# Patient Record
Sex: Male | Born: 1985 | Race: Black or African American | Hispanic: No | Smoking: Current every day smoker
Health system: Southern US, Community
[De-identification: ages and names within clinical notes are randomized; demographics above are authoritative.]

## PROBLEM LIST (undated history)

## (undated) DIAGNOSIS — R0600 Dyspnea, unspecified: Secondary | ICD-10-CM

## (undated) DIAGNOSIS — F419 Anxiety disorder, unspecified: Secondary | ICD-10-CM

## (undated) DIAGNOSIS — Z87898 Personal history of other specified conditions: Secondary | ICD-10-CM

## (undated) DIAGNOSIS — F329 Major depressive disorder, single episode, unspecified: Secondary | ICD-10-CM

## (undated) DIAGNOSIS — F32A Depression, unspecified: Secondary | ICD-10-CM

## (undated) DIAGNOSIS — N186 End stage renal disease: Secondary | ICD-10-CM

## (undated) DIAGNOSIS — Z9889 Other specified postprocedural states: Secondary | ICD-10-CM

## (undated) DIAGNOSIS — N19 Unspecified kidney failure: Secondary | ICD-10-CM

## (undated) DIAGNOSIS — I499 Cardiac arrhythmia, unspecified: Secondary | ICD-10-CM

## (undated) DIAGNOSIS — I639 Cerebral infarction, unspecified: Secondary | ICD-10-CM

## (undated) DIAGNOSIS — I10 Essential (primary) hypertension: Secondary | ICD-10-CM

## (undated) DIAGNOSIS — I2510 Atherosclerotic heart disease of native coronary artery without angina pectoris: Secondary | ICD-10-CM

## (undated) HISTORY — PX: CORONARY ANGIOPLASTY: SHX604

## (undated) HISTORY — DX: Major depressive disorder, single episode, unspecified: F32.9

## (undated) HISTORY — DX: Anxiety disorder, unspecified: F41.9

## (undated) HISTORY — DX: Cerebral infarction, unspecified: I63.9

## (undated) HISTORY — DX: Depression, unspecified: F32.A

## (undated) HISTORY — DX: Cardiac arrhythmia, unspecified: I49.9

## (undated) HISTORY — DX: Unspecified kidney failure: N19

---

## 2007-12-22 ENCOUNTER — Emergency Department: Payer: Self-pay | Admitting: Emergency Medicine

## 2009-08-08 ENCOUNTER — Emergency Department: Payer: Self-pay | Admitting: Emergency Medicine

## 2009-08-25 ENCOUNTER — Ambulatory Visit: Payer: Self-pay | Admitting: Internal Medicine

## 2009-12-13 ENCOUNTER — Emergency Department (HOSPITAL_COMMUNITY): Admission: EM | Admit: 2009-12-13 | Discharge: 2009-12-13 | Payer: Self-pay | Admitting: Emergency Medicine

## 2009-12-30 ENCOUNTER — Emergency Department: Payer: Self-pay | Admitting: Emergency Medicine

## 2010-06-21 ENCOUNTER — Emergency Department: Payer: Self-pay | Admitting: Unknown Physician Specialty

## 2010-08-14 ENCOUNTER — Emergency Department: Payer: Self-pay | Admitting: Emergency Medicine

## 2011-01-20 ENCOUNTER — Emergency Department: Payer: Self-pay | Admitting: Emergency Medicine

## 2011-04-18 ENCOUNTER — Emergency Department: Payer: Self-pay | Admitting: Internal Medicine

## 2011-10-05 ENCOUNTER — Emergency Department: Payer: Self-pay | Admitting: Internal Medicine

## 2011-10-07 ENCOUNTER — Emergency Department: Payer: Self-pay | Admitting: Emergency Medicine

## 2011-10-18 ENCOUNTER — Emergency Department: Payer: Self-pay | Admitting: Internal Medicine

## 2011-12-01 ENCOUNTER — Emergency Department: Payer: Self-pay | Admitting: *Deleted

## 2012-01-08 ENCOUNTER — Emergency Department: Payer: Self-pay

## 2012-01-08 LAB — CBC
HGB: 14.1 g/dL (ref 13.0–18.0)
MCH: 28.8 pg (ref 26.0–34.0)
MCHC: 33.4 g/dL (ref 32.0–36.0)
MCV: 86 fL (ref 80–100)
Platelet: 147 10*3/uL — ABNORMAL LOW (ref 150–440)
RBC: 4.88 10*6/uL (ref 4.40–5.90)

## 2012-01-08 LAB — COMPREHENSIVE METABOLIC PANEL
Anion Gap: 9 (ref 7–16)
BUN: 12 mg/dL (ref 7–18)
Calcium, Total: 8.5 mg/dL (ref 8.5–10.1)
Chloride: 104 mmol/L (ref 98–107)
Creatinine: 0.93 mg/dL (ref 0.60–1.30)
Glucose: 89 mg/dL (ref 65–99)
Osmolality: 279 (ref 275–301)
Potassium: 3.4 mmol/L — ABNORMAL LOW (ref 3.5–5.1)
SGOT(AST): 42 U/L — ABNORMAL HIGH (ref 15–37)
Total Protein: 7.5 g/dL (ref 6.4–8.2)

## 2012-01-08 LAB — CK TOTAL AND CKMB (NOT AT ARMC): CK-MB: 4.3 ng/mL — ABNORMAL HIGH (ref 0.5–3.6)

## 2012-03-07 ENCOUNTER — Emergency Department: Payer: Self-pay | Admitting: Emergency Medicine

## 2012-03-14 ENCOUNTER — Emergency Department: Payer: Self-pay | Admitting: Emergency Medicine

## 2012-03-15 LAB — COMPREHENSIVE METABOLIC PANEL
Albumin: 4 g/dL (ref 3.4–5.0)
Anion Gap: 8 (ref 7–16)
BUN: 9 mg/dL (ref 7–18)
Chloride: 104 mmol/L (ref 98–107)
Co2: 27 mmol/L (ref 21–32)
EGFR (African American): >60
Glucose: 109 mg/dL — ABNORMAL HIGH (ref 65–99)
Osmolality: 277 (ref 275–301)
SGOT(AST): 49 U/L — ABNORMAL HIGH (ref 15–37)
SGPT (ALT): 48 U/L
Total Protein: 7.2 g/dL (ref 6.4–8.2)

## 2012-03-15 LAB — CBC: RBC: 4.53 10*6/uL (ref 4.40–5.90)

## 2012-03-15 LAB — CK TOTAL AND CKMB (NOT AT ARMC): CK-MB: 6.8 ng/mL — ABNORMAL HIGH (ref 0.5–3.6)

## 2012-05-05 ENCOUNTER — Emergency Department: Payer: Self-pay | Admitting: *Deleted

## 2012-05-05 LAB — COMPREHENSIVE METABOLIC PANEL
Anion Gap: 6 — ABNORMAL LOW (ref 7–16)
BUN: 16 mg/dL (ref 7–18)
Bilirubin,Total: 0.2 mg/dL (ref 0.2–1.0)
Chloride: 104 mmol/L (ref 98–107)
Creatinine: 0.92 mg/dL (ref 0.60–1.30)
Osmolality: 284 (ref 275–301)

## 2012-05-05 LAB — CBC
MCH: 28.2 pg (ref 26.0–34.0)
Platelet: 159 10*3/uL (ref 150–440)
RBC: 4.98 10*6/uL (ref 4.40–5.90)

## 2012-05-13 ENCOUNTER — Ambulatory Visit: Payer: Self-pay | Admitting: Internal Medicine

## 2012-05-13 LAB — CBC
HCT: 44.7 % (ref 40.0–52.0)
HGB: 14.8 g/dL (ref 13.0–18.0)
MCH: 28.5 pg (ref 26.0–34.0)
MCHC: 33.2 g/dL (ref 32.0–36.0)
MCV: 86 fL (ref 80–100)
Platelet: 172 x10 3/mm 3 (ref 150–440)
RBC: 5.19 x10 6/mm 3 (ref 4.40–5.90)
RDW: 14.3 % (ref 11.5–14.5)
WBC: 8.6 x10 3/mm 3 (ref 3.8–10.6)

## 2012-05-13 LAB — COMPREHENSIVE METABOLIC PANEL
Alkaline Phosphatase: 87 U/L (ref 50–136)
BUN: 13 mg/dL (ref 7–18)
Chloride: 106 mmol/L (ref 98–107)
Co2: 28 mmol/L (ref 21–32)
Creatinine: 0.93 mg/dL (ref 0.60–1.30)
EGFR (African American): >60
Glucose: 97 mg/dL (ref 65–99)
Osmolality: 281 (ref 275–301)
SGOT(AST): 51 U/L — ABNORMAL HIGH (ref 15–37)
Sodium: 141 mmol/L (ref 136–145)

## 2012-05-13 LAB — APTT: Activated PTT: 31.4 s (ref 23.6–35.9)

## 2012-05-13 LAB — CK TOTAL AND CKMB (NOT AT ARMC)
CK, Total: 484 U/L — ABNORMAL HIGH (ref 35–232)
CK-MB: 3.2 ng/mL (ref 0.5–3.6)

## 2012-05-13 LAB — PROTIME-INR: INR: 1

## 2012-05-13 LAB — TROPONIN I: Troponin-I: 0.02 ng/mL

## 2012-05-22 ENCOUNTER — Emergency Department: Payer: Self-pay | Admitting: *Deleted

## 2012-09-14 ENCOUNTER — Emergency Department: Payer: Self-pay | Admitting: *Deleted

## 2012-09-14 LAB — CBC
MCH: 29.6 pg (ref 26.0–34.0)
MCHC: 34.4 g/dL (ref 32.0–36.0)
MCV: 86 fL (ref 80–100)
Platelet: 157 10*3/uL (ref 150–440)

## 2012-09-14 LAB — COMPREHENSIVE METABOLIC PANEL
Albumin: 4.3 g/dL (ref 3.4–5.0)
Alkaline Phosphatase: 69 U/L (ref 50–136)
BUN: 14 mg/dL (ref 7–18)
Bilirubin,Total: 0.3 mg/dL (ref 0.2–1.0)
Co2: 30 mmol/L (ref 21–32)
Creatinine: 0.96 mg/dL (ref 0.60–1.30)
EGFR (Non-African Amer.): >60
SGPT (ALT): 42 U/L (ref 12–78)
Sodium: 140 mmol/L (ref 136–145)

## 2012-09-14 LAB — CK TOTAL AND CKMB (NOT AT ARMC): CK, Total: 451 U/L — ABNORMAL HIGH (ref 35–232)

## 2012-10-20 ENCOUNTER — Emergency Department: Payer: Self-pay | Admitting: Emergency Medicine

## 2012-10-20 LAB — URINALYSIS, COMPLETE
Ketone: NEGATIVE
Nitrite: NEGATIVE
Ph: 7 (ref 4.5–8.0)
Protein: 30
Specific Gravity: 1.019 (ref 1.003–1.030)
WBC UR: 291 /HPF (ref 0–5)

## 2013-10-14 ENCOUNTER — Emergency Department: Payer: Self-pay | Admitting: Emergency Medicine

## 2013-10-14 LAB — CBC
HGB: 14 g/dL (ref 13.0–18.0)
Platelet: 152 10*3/uL (ref 150–440)
WBC: 10.4 10*3/uL (ref 3.8–10.6)

## 2013-10-14 LAB — COMPREHENSIVE METABOLIC PANEL
Alkaline Phosphatase: 73 U/L
Calcium, Total: 9 mg/dL (ref 8.5–10.1)
Osmolality: 273 (ref 275–301)
Potassium: 3.5 mmol/L (ref 3.5–5.1)

## 2015-01-17 ENCOUNTER — Emergency Department: Payer: Self-pay | Admitting: Internal Medicine

## 2015-02-14 ENCOUNTER — Emergency Department: Payer: Self-pay | Admitting: Emergency Medicine

## 2015-02-14 LAB — BASIC METABOLIC PANEL
Anion Gap: 6 — ABNORMAL LOW (ref 7–16)
BUN: 13 mg/dL
CHLORIDE: 101 mmol/L
CREATININE: 1.12 mg/dL
Calcium, Total: 9.4 mg/dL
Co2: 32 mmol/L
GLUCOSE: 105 mg/dL — AB
Potassium: 3.4 mmol/L — ABNORMAL LOW
Sodium: 139 mmol/L

## 2015-02-14 LAB — CBC
HCT: 43.1 % (ref 40.0–52.0)
HGB: 14.6 g/dL (ref 13.0–18.0)
MCH: 28.8 pg (ref 26.0–34.0)
MCHC: 33.8 g/dL (ref 32.0–36.0)
MCV: 85 fL (ref 80–100)
PLATELETS: 145 10*3/uL — AB (ref 150–440)
RBC: 5.05 10*6/uL (ref 4.40–5.90)
RDW: 13.8 % (ref 11.5–14.5)
WBC: 9.6 10*3/uL (ref 3.8–10.6)

## 2015-02-14 LAB — TROPONIN I

## 2015-03-12 NOTE — H&P (Signed)
PATIENT NAME:  Alan Gross, Alan Gross MR#:  [id number] DATE OF BIRTH:  1986/05/10  DATE OF ADMISSION:  05/13/2012  PRIMARY CARE PHYSICIAN: None  ED REFERRING PHYSICIAN: Dr. Prince Rome  CHIEF COMPLAINT: Chest pain.   HISTORY OF PRESENT ILLNESS: Patient is a 29 year old African American male with previous history of coronary artery disease who also has history of uncontrolled blood pressure who presents with complaint of chest pain since last night. He describes the pain as a sharp type of pain with associated shortness of breath. Patient reports a milder episode last Sunday but did not seek any medical care. He did not have any radiation of the pain to his arm or jaw. He did also have some shortness of breath. His pain is improved. He was seen in the ED, had an EKG that suggested a possible ST elevation. He was seen by cardiology, Dr. Clayborn Bigness, who will be taking him to catheterization. He recommended that we admit the patient. Patient currently is feeling better and chest pain is improved. Otherwise denies any dyspnea on exertion, nocturnal dyspnea, orthopnea.   PAST MEDICAL HISTORY:  1. Seizure disorder.  2. Hypertension.  3. Previous history of myocardial infarction.   PAST SURGICAL HISTORY: None.   ALLERGIES: None.   MEDICATIONS: He was on antihypertensives which he stopped taking.   SOCIAL HISTORY: Smokes less than 1 pack per day. Does not drink. Uses marijuana from time to time.   FAMILY HISTORY: Grandmother had heart disease.   REVIEW OF SYSTEMS: CONSTITUTIONAL: Denies any fevers, fatigue, weakness. Chest pain as above. Complains of pain on his tooth that has been bothering him for a while. Denies any weight loss, weight gain. EYES: No blurred or double vision. No pain. No redness. No inflammation. No glaucoma. ENT: No tinnitus. No ear pain. No hearing loss. No seasonal or year-round allergies. No nasal discharge. No difficulty swallowing. RESPIRATORY: No cough. No wheezing. No hemoptysis. No  chronic obstructive pulmonary disease. No TB. CARDIOVASCULAR: Complains of chest pain as above. No orthopnea. No edema. No arrhythmia. No palpitations. No syncope. GASTROINTESTINAL: No nausea, vomiting, diarrhea. No abdominal pain. No hematemesis. No gastroesophageal reflux disease. No irritable bowel syndrome. No jaundice. GENITOURINARY: Denies any dysuria, hematuria, renal calculus, or frequency. ENDO: Denies any polydipsia, nocturia, or thyroid problems. HEME/LYMPH: Denies anemia, easy bruisability, or bleeding. SKIN: No acne. No rash. No changes in mole, hair or skin. MUSCULOSKELETAL: Denies any pain in neck, back, or shoulder. NEURO: No numbness. No weakness. No cerebrovascular accident. No transient ischemic attack. PSYCHIATRIC: No anxiety. No examination. No ADD.   PHYSICAL EXAMINATION:  VITAL SIGNS: Temperature 98.4, pulse 67, respirations 18, blood pressure 158/88, O2 98% on room air.   GENERAL: Patient is a well developed, well nourished [name] male currently not in any distress.   HEENT: Head atraumatic, normocephalic. Pupils equally round, reactive to light and accommodation. There is no JVD. No carotid bruits.   CARDIOVASCULAR: Regular rate and rhythm. No murmurs, rubs, clicks, or gallops. PMI is not displaced.   LUNGS: Clear to auscultation bilaterally without any rales, rhonchi, wheezing.   ABDOMEN: Soft, nontender, nondistended. Positive bowel sounds x4.   EXTREMITIES: No clubbing, cyanosis, edema.   SKIN: No rash.   LYMPHATICS: No lymph nodes palpable.   VASCULAR: Good DP, PT pulses.   PSYCHIATRIC: Not anxious or depressed.   NEUROLOGICAL: Cranial nerves II through XII grossly intact. No focal deficits.   LYMPHATICS: No lymph nodes palpable.   LABORATORY, DIAGNOSTIC AND RADIOLOGICAL DATA: WBC 8.6, hemoglobin 14.8, platelet  count 172. INR 1.0. Cardiac enzymes are currently pending. Patient is going to catheterization lab. EKG shows slight ST elevation in V2, V3,  V4, inverted T waves in inferior leads.   ASSESSMENT AND PLAN: Patient is a 29 year old African American male presents with chest pain since last night, noted to have abnormal EKG. Seen by cardiology. Plan for catheterization.  1. Chest pain, possibly due to MI. Will place him on aspirin. Cardiac catheterization today. Beta blockers. Will also check TUDS to make sure there is no cocaine-induced chest pain.  2. Hypertension. Will place patient on metoprolol.  3. Seizure disorder, not on any treatment.  4. Nicotine addiction.  5. Please note if patient's cardiac catheterization is negative he will be discharged later today. He states that he has to go to work and he is requesting to be discharged if catheterization is negative.   TIME SPENT: 30 minutes.   ____________________________ Lafonda Mosses Posey Pronto, MD shp:cms D: 05/13/2012 14:06:11 ET T: 05/13/2012 14:21:23 ET JOB#: [id number]  cc: Loralye Loberg H. Posey Pronto, MD, <Dictator>  Alric Seton MD ELECTRONICALLY SIGNED 05/14/2012 16:38

## 2015-03-12 NOTE — Consult Note (Signed)
PATIENT NAME:  Alan Gross, Alan Gross MR#:  [id number] DATE OF BIRTH:  12/16/1985  DATE OF CONSULTATION:  05/13/2012  REFERRING PHYSICIAN:  Beaulah Dinning, MD CONSULTING PHYSICIAN:  Jolan Upchurch D. Kris Burd, MD  INDICATION: Grossly abnormal EKG, persistent unstable angina and chest pain with malignant hypertension.  HISTORY OF PRESENT ILLNESS: Alan Gross is a 29 year old African American male with hypertension, possible myocardial infarction in the past, seizure disorder, and possible noncompliance who presents with recurrent chest pain and angina, midsternal, incapacitating, 10/10, with radiation to his back. He has had some shortness of breath, but no syncope. The pain got progressively worse so he came to Emergency Room for evaluation and admission. Reportedly had an EKG done about a week ago for unclear reasons and presents today with grossly abnormal EKG with diffuse ST elevation laterally and anterior with reciprocal depression inferiorly, left ventricular hypertrophy voltage criteria. He denies any drug use, but with the persistent pain he came to Emergency Room and was decided to be admitted.   REVIEW OF SYSTEMS: No blackout spells or syncope. No nausea or vomiting. No fever or chills. No sweats. No weight loss or weight gain. No hemoptysis or hematemesis. No bright red blood per rectum.   PAST MEDICAL HISTORY:  1. Chest pain. 2. Angina. 3. Seizures. 4. Myocardial infarction.  5. Hypertension. 6. Obesity.   SOCIAL HISTORY: I think he is a smoker. It is not clear if he uses alcohol, but illicit drugs.   MEDICATIONS: It is not clear if he is on any medicines, but should be on blood pressure medicine.   ALLERGIES: No known drug allergies.  PHYSICAL EXAMINATION:   VITAL SIGNS: Blood pressure 180/90, pulse 65, respiratory rate 18, and afebrile.   HEENT: Normocephalic, atraumatic. Pupils equal and reactive to light.   NECK: Supple. No significant jugular venous distention, bruits or adenopathy.    LUNGS: Clear to auscultation and percussion. No significant wheeze, rhonchi, or rale.   HEART: Regular rate and rhythm. Systolic ejection murmur at left sternal border. Positive S4. PMI is nondisplaced.   ABDOMEN: Examination is benign.   EXTREMITIES: Examination is within normal limits.   NEUROLOGIC: Examination is intact.   SKIN: Examination is normal.   LABORATORY, DIAGNOSTIC AND RADIOLOGIC DATA: CK 484, MB 3.2, and troponin 0.02. PT-INR negative. Hemoglobin and hematocrit 14.8 and 47, white count 8.6, and platelet count 172. MET-B unremarkable. LFTs slightly elevated with ALT 74 and AST 51.   Chest x-ray is pending.   EKG is grossly abnormal, as I mentioned.   ASSESSMENT:  1. Unstable angina.  2. Angina.  3. Possible myocardial infarction. 4. Malignant hypertension. 5. Obesity. 6. Reportedly has history of smoking. 7. Myocardial infarction in the past. Reportedly had a catheterization at [facility], which reportedly looked okay, but now presents with recurrent chest pain.   PLAN: Recommend further evaluation. We will probably proceed with cardiac catheterization as he is requiring morphine in the Emergency Room and narcotics for chest pain with a grossly abnormal EKG, malignant hypertension, and history of myocardial infarction in the past. Follow up LFTs. Would probably institute hypertensive medication. With his malignant hypertension, we will probably evaluate his renal arteries for renal artery stenosis. Recommend weight loss and exercise and base further evaluation until after the catheterization. Hopefully the catheterization will be okay and we can treat the patient for noncardiac pain. ____________________________ Loran Senters. Clayborn Bigness, MD ddc:slb D: 05/13/2012 14:01:24 ET T: 05/13/2012 14:25:38 ET JOB#: [id number]  cc: Muhsin Doris D. Clayborn Bigness, MD, <Dictator> Tinsley Lomas Prince Rome MD ELECTRONICALLY  SIGNED 06/01/2012 6:36

## 2015-03-12 NOTE — Discharge Summary (Signed)
PATIENT NAME:  Alan Gross, Alan Gross MR#:  [id number] DATE OF BIRTH:  1985/11/27  DATE OF ADMISSION:  05/13/2012 DATE OF DISCHARGE:  05/13/2012  ADMITTING DIAGNOSES: Chest pain.   DISCHARGE DIAGNOSES:  1. Chest pain felt to be noncardiac in origin, status post cardiac catheterization, possibly related to accelerated hypertension. Could be also reflux related.  2. Hypertension.  3. Previous history of coronary artery disease, per patient's report. 4. Seizure disorder.   CONSULTANTS: Lujean Amel, MD - Cardiology.  PROCEDURES: Cardiac catheterization showed normal coronaries, normal ejection fraction.   PERTINENT LABORATORY AND EVALUATIONS: Glucose 97, BUN 13, creatinine 0.93, sodium 141, potassium 3.8, chloride 106, CO2 28, and calcium 9.1. LFTs were normal except AST was 51. CPK was 484 and CK-MB 3.2. Troponin was 0.02. WBC 8.6, hemoglobin 14.8, and platelet count 172. INR was 1.0.   Chest x-ray showed cardiomegaly without any other abnormality.   EKG showed possible ST elevation in leads III, IV and V and T wave inversions in inferior leads.   HOSPITAL COURSE: The patient is a 29 year old white male who presented with chest pain. He reported that he has a history of coronary artery disease and started having chest pain since yesterday. The patient had an EKG which showed possible ST elevation in anterior leads. He does have left ventricular hypertrophy and this is likely due to repolarization abnormality. The ED physician spoke to Dr. Clayborn Bigness who came and saw the patient and recommended the patient go to cardiac catheterization. He underwent cardiac catheterization, which was normal. At this time, the patient is stable to be discharged home.    DISCHARGE MEDICATIONS:  1. Atenolol 50 mg p.o. daily. 2. Percocet 5/325 mg p.o. every eight hours p.r.n.  3. Zantac 150 mg p.o. twice a day, over-the-counter.   DIET: Low sodium.   ACTIVITY: As tolerated.   TIMEFRAME FOR FOLLOW UP: Followup in  2 to 4 weeks at the [facility].  TIME SPENT: 25 minutes.  ____________________________ Lafonda Mosses Posey Pronto, MD shp:slb D: 05/13/2012 16:31:33 ET T: 05/14/2012 14:33:18 ET JOB#: [id number]  cc: Rocky Rishel H. Posey Pronto, MD, <Dictator> [facility] Alric Seton MD ELECTRONICALLY SIGNED 05/14/2012 16:40

## 2015-05-02 ENCOUNTER — Encounter: Payer: Self-pay | Admitting: Emergency Medicine

## 2015-05-02 ENCOUNTER — Emergency Department
Admit: 2015-05-02 | Discharge: 2015-05-02 | Disposition: A | Discharge status: Home / Self Care | Payer: 59 | Attending: Emergency Medicine | Admitting: Emergency Medicine

## 2015-05-02 ENCOUNTER — Other Ambulatory Visit: Payer: Self-pay

## 2015-05-02 ENCOUNTER — Encounter
Admission: EM | Disposition: A | Discharge status: Home / Self Care | Payer: Self-pay | Source: Home / Self Care | Attending: Internal Medicine

## 2015-05-02 ENCOUNTER — Inpatient Hospital Stay
Admission: EM | Admit: 2015-05-02 | Discharge: 2015-05-04 | DRG: 287 | Disposition: A | Discharge status: Home / Self Care | Payer: 59 | Attending: Internal Medicine | Admitting: Internal Medicine

## 2015-05-02 DIAGNOSIS — I249 Acute ischemic heart disease, unspecified: Principal | ICD-10-CM | POA: Diagnosis present

## 2015-05-02 DIAGNOSIS — I429 Cardiomyopathy, unspecified: Secondary | ICD-10-CM | POA: Diagnosis present

## 2015-05-02 DIAGNOSIS — Z7982 Long term (current) use of aspirin: Secondary | ICD-10-CM

## 2015-05-02 DIAGNOSIS — I10 Essential (primary) hypertension: Secondary | ICD-10-CM | POA: Diagnosis present

## 2015-05-02 DIAGNOSIS — R739 Hyperglycemia, unspecified: Secondary | ICD-10-CM | POA: Diagnosis present

## 2015-05-02 DIAGNOSIS — E876 Hypokalemia: Secondary | ICD-10-CM | POA: Diagnosis present

## 2015-05-02 DIAGNOSIS — D72829 Elevated white blood cell count, unspecified: Secondary | ICD-10-CM | POA: Diagnosis present

## 2015-05-02 DIAGNOSIS — F17210 Nicotine dependence, cigarettes, uncomplicated: Secondary | ICD-10-CM | POA: Diagnosis present

## 2015-05-02 DIAGNOSIS — F1210 Cannabis abuse, uncomplicated: Secondary | ICD-10-CM | POA: Diagnosis present

## 2015-05-02 DIAGNOSIS — F1910 Other psychoactive substance abuse, uncomplicated: Secondary | ICD-10-CM | POA: Diagnosis present

## 2015-05-02 HISTORY — PX: CARDIAC CATHETERIZATION: SHX172

## 2015-05-02 HISTORY — DX: Essential (primary) hypertension: I10

## 2015-05-02 LAB — CBC WITH DIFFERENTIAL/PLATELET
BASOS ABS: 0.1 10*3/uL (ref 0–0.1)
BASOS PCT: 1 %
EOS ABS: 0 10*3/uL (ref 0–0.7)
Eosinophils Relative: 0 %
HCT: 41.7 % (ref 40.0–52.0)
Hemoglobin: 14 g/dL (ref 13.0–18.0)
Lymphocytes Relative: 9 %
Lymphs Abs: 1.2 10*3/uL (ref 1.0–3.6)
MCH: 28.6 pg (ref 26.0–34.0)
MCHC: 33.5 g/dL (ref 32.0–36.0)
MCV: 85.4 fL (ref 80.0–100.0)
Monocytes Absolute: 0.8 10*3/uL (ref 0.2–1.0)
Monocytes Relative: 6 %
NEUTROS ABS: 11.5 10*3/uL — AB (ref 1.4–6.5)
NEUTROS PCT: 84 %
PLATELETS: 117 10*3/uL — AB (ref 150–440)
RBC: 4.89 MIL/uL (ref 4.40–5.90)
RDW: 13.2 % (ref 11.5–14.5)
WBC: 13.6 10*3/uL — ABNORMAL HIGH (ref 3.8–10.6)

## 2015-05-02 LAB — BASIC METABOLIC PANEL
Anion gap: 7 (ref 5–15)
BUN: 12 mg/dL (ref 6–20)
CALCIUM: 9 mg/dL (ref 8.9–10.3)
CO2: 33 mmol/L — ABNORMAL HIGH (ref 22–32)
Chloride: 96 mmol/L — ABNORMAL LOW (ref 101–111)
Creatinine, Ser: 1.24 mg/dL (ref 0.61–1.24)
GLUCOSE: 154 mg/dL — AB (ref 65–99)
POTASSIUM: 2.7 mmol/L — AB (ref 3.5–5.1)
SODIUM: 136 mmol/L (ref 135–145)

## 2015-05-02 LAB — MAGNESIUM: Magnesium: 1.9 mg/dL (ref 1.7–2.4)

## 2015-05-02 LAB — TROPONIN I
Troponin I: <0.03 ng/mL (ref <0.031)
Troponin I: <0.03 ng/mL (ref <0.031)

## 2015-05-02 SURGERY — LEFT HEART CATH AND CORONARY ANGIOGRAPHY
Anesthesia: Moderate Sedation | Laterality: Left

## 2015-05-02 MED ORDER — SODIUM CHLORIDE 0.9 % IJ SOLN
3.0000 mL | Freq: Two times a day (BID) | INTRAMUSCULAR | Status: DC
  Administered 2015-05-02: 3 mL via INTRAVENOUS

## 2015-05-02 MED ORDER — ASPIRIN 81 MG PO CHEW
CHEWABLE_TABLET | ORAL | Status: AC
  Administered 2015-05-02: 324 mg via ORAL
  Filled 2015-05-02: qty 4

## 2015-05-02 MED ORDER — POTASSIUM CHLORIDE CRYS ER 20 MEQ PO TBCR
40.0000 meq | EXTENDED_RELEASE_TABLET | Freq: Once | ORAL | Status: AC
  Administered 2015-05-02: 40 meq via ORAL
  Filled 2015-05-02: qty 2

## 2015-05-02 MED ORDER — ACETAMINOPHEN 325 MG PO TABS
650.0000 mg | ORAL_TABLET | ORAL | Status: DC | PRN
  Administered 2015-05-03: 650 mg via ORAL
  Filled 2015-05-02 (×2): qty 2

## 2015-05-02 MED ORDER — HEPARIN (PORCINE) IN NACL 2-0.9 UNIT/ML-% IJ SOLN
INTRAMUSCULAR | Status: AC
  Filled 2015-05-02: qty 1000

## 2015-05-02 MED ORDER — SODIUM CHLORIDE 0.9 % IV SOLN: 250.0000 mL | INTRAVENOUS | Status: DC | PRN

## 2015-05-02 MED ORDER — ASPIRIN 81 MG PO CHEW
81.0000 mg | CHEWABLE_TABLET | ORAL | Status: AC
  Administered 2015-05-03: 81 mg via ORAL
  Filled 2015-05-02: qty 1

## 2015-05-02 MED ORDER — MIDAZOLAM HCL 2 MG/2ML IJ SOLN
INTRAMUSCULAR | Status: AC
  Filled 2015-05-02: qty 2

## 2015-05-02 MED ORDER — LABETALOL HCL 5 MG/ML IV SOLN
10.0000 mg | Freq: Once | INTRAVENOUS | Status: AC
  Administered 2015-05-02: 10 mg via INTRAVENOUS

## 2015-05-02 MED ORDER — LABETALOL HCL 5 MG/ML IV SOLN
INTRAVENOUS | Status: AC
  Filled 2015-05-02: qty 4

## 2015-05-02 MED ORDER — ONDANSETRON HCL 4 MG/2ML IJ SOLN: 4.0000 mg | Freq: Four times a day (QID) | INTRAMUSCULAR | Status: DC | PRN

## 2015-05-02 MED ORDER — ASPIRIN EC 81 MG PO TBEC
81.0000 mg | DELAYED_RELEASE_TABLET | Freq: Every day | ORAL | Status: DC
  Administered 2015-05-03 – 2015-05-04 (×2): 81 mg via ORAL
  Filled 2015-05-02 (×2): qty 1

## 2015-05-02 MED ORDER — NITROGLYCERIN 0.4 MG SL SUBL: 0.4000 mg | SUBLINGUAL_TABLET | SUBLINGUAL | Status: DC | PRN

## 2015-05-02 MED ORDER — IOHEXOL 300 MG/ML  SOLN
INTRAMUSCULAR | Status: DC | PRN
  Administered 2015-05-02: 30 mL via INTRA_ARTERIAL
  Administered 2015-05-02: 80 mL via INTRA_ARTERIAL

## 2015-05-02 MED ORDER — ONDANSETRON HCL 4 MG/2ML IJ SOLN
4.0000 mg | Freq: Four times a day (QID) | INTRAMUSCULAR | Status: DC | PRN
  Administered 2015-05-03: 4 mg via INTRAVENOUS
  Filled 2015-05-02: qty 2

## 2015-05-02 MED ORDER — MIDAZOLAM HCL 2 MG/2ML IJ SOLN
INTRAMUSCULAR | Status: DC | PRN
  Administered 2015-05-02 (×2): 1 mg via INTRAVENOUS

## 2015-05-02 MED ORDER — LABETALOL HCL 5 MG/ML IV SOLN
INTRAVENOUS | Status: AC
  Administered 2015-05-02: 10 mg via INTRAVENOUS
  Filled 2015-05-02: qty 4

## 2015-05-02 MED ORDER — HYDRALAZINE HCL 20 MG/ML IJ SOLN
INTRAMUSCULAR | Status: AC
  Administered 2015-05-02: 10 mg via INTRAVENOUS
  Filled 2015-05-02: qty 1

## 2015-05-02 MED ORDER — SODIUM CHLORIDE 0.9 % IJ SOLN: 3.0000 mL | Freq: Two times a day (BID) | INTRAMUSCULAR | Status: DC

## 2015-05-02 MED ORDER — SODIUM CHLORIDE 0.9 % IJ SOLN: 3.0000 mL | INTRAMUSCULAR | Status: DC | PRN

## 2015-05-02 MED ORDER — SODIUM CHLORIDE 0.9 % WEIGHT BASED INFUSION: 3.0000 mL/kg/h | INTRAVENOUS | Status: DC

## 2015-05-02 MED ORDER — POTASSIUM CHLORIDE CRYS ER 20 MEQ PO TBCR
EXTENDED_RELEASE_TABLET | ORAL | Status: AC
  Filled 2015-05-02: qty 2

## 2015-05-02 MED ORDER — NICOTINE 21 MG/24HR TD PT24
21.0000 mg | MEDICATED_PATCH | Freq: Every day | TRANSDERMAL | Status: DC
  Administered 2015-05-03: 21 mg via TRANSDERMAL
  Filled 2015-05-02: qty 1

## 2015-05-02 MED ORDER — POTASSIUM CHLORIDE 2 MEQ/ML IV SOLN
INTRAVENOUS | Status: DC
  Administered 2015-05-02 – 2015-05-03 (×2): via INTRAVENOUS
  Filled 2015-05-02 (×11): qty 1000

## 2015-05-02 MED ORDER — ASPIRIN 300 MG RE SUPP: 300.0000 mg | RECTAL | Status: AC

## 2015-05-02 MED ORDER — METOPROLOL TARTRATE 25 MG PO TABS
25.0000 mg | ORAL_TABLET | Freq: Two times a day (BID) | ORAL | Status: DC
  Administered 2015-05-02 – 2015-05-03 (×3): 25 mg via ORAL
  Filled 2015-05-02 (×2): qty 1

## 2015-05-02 MED ORDER — ATORVASTATIN CALCIUM 20 MG PO TABS
40.0000 mg | ORAL_TABLET | Freq: Every day | ORAL | Status: DC
Start: 2015-05-03 — End: 2015-05-04
  Administered 2015-05-03: 40 mg via ORAL
  Filled 2015-05-02: qty 2

## 2015-05-02 MED ORDER — ASPIRIN 81 MG PO CHEW
324.0000 mg | CHEWABLE_TABLET | ORAL | Status: AC
  Administered 2015-05-02: 324 mg via ORAL
  Filled 2015-05-02 (×2): qty 4

## 2015-05-02 MED ORDER — LABETALOL HCL 5 MG/ML IV SOLN
INTRAVENOUS | Status: DC | PRN
  Administered 2015-05-02: 20 mg via INTRAVENOUS

## 2015-05-02 MED ORDER — SODIUM CHLORIDE 0.9 % WEIGHT BASED INFUSION
3.0000 mL/kg/h | INTRAVENOUS | Status: AC
Start: 2015-05-02 — End: 2015-05-02

## 2015-05-02 MED ORDER — FENTANYL CITRATE (PF) 100 MCG/2ML IJ SOLN
INTRAMUSCULAR | Status: DC | PRN
  Administered 2015-05-02 (×2): 50 ug via INTRAVENOUS

## 2015-05-02 MED ORDER — FENTANYL CITRATE (PF) 100 MCG/2ML IJ SOLN
INTRAMUSCULAR | Status: AC
  Filled 2015-05-02: qty 2

## 2015-05-02 MED ORDER — SODIUM CHLORIDE 0.9 % IV SOLN: INTRAVENOUS | Status: DC

## 2015-05-02 MED ORDER — SODIUM CHLORIDE 0.9 % WEIGHT BASED INFUSION
1.0000 mL/kg/h | INTRAVENOUS | Status: DC
Start: 2015-05-03 — End: 2015-05-03

## 2015-05-02 MED ORDER — HYDRALAZINE HCL 20 MG/ML IJ SOLN
10.0000 mg | Freq: Four times a day (QID) | INTRAMUSCULAR | Status: DC | PRN
  Administered 2015-05-02 – 2015-05-03 (×3): 10 mg via INTRAVENOUS
  Filled 2015-05-02 (×2): qty 1

## 2015-05-02 MED ORDER — ACETAMINOPHEN 325 MG PO TABS
650.0000 mg | ORAL_TABLET | ORAL | Status: DC | PRN
  Administered 2015-05-03: 650 mg via ORAL

## 2015-05-02 MED ORDER — LISINOPRIL 10 MG PO TABS
10.0000 mg | ORAL_TABLET | Freq: Every day | ORAL | Status: DC
  Administered 2015-05-02 – 2015-05-04 (×3): 10 mg via ORAL
  Filled 2015-05-02 (×3): qty 1

## 2015-05-02 MED ORDER — ASPIRIN 81 MG PO CHEW
324.0000 mg | CHEWABLE_TABLET | Freq: Once | ORAL | Status: AC
  Administered 2015-05-02: 324 mg via ORAL

## 2015-05-02 MED ORDER — METOPROLOL TARTRATE 25 MG PO TABS
25.0000 mg | ORAL_TABLET | ORAL | Status: DC
  Filled 2015-05-02: qty 1

## 2015-05-02 MED ORDER — NICOTINE 14 MG/24HR TD PT24
14.0000 mg | MEDICATED_PATCH | Freq: Every day | TRANSDERMAL | Status: DC
  Administered 2015-05-02 – 2015-05-04 (×2): 14 mg via TRANSDERMAL
  Filled 2015-05-02 (×2): qty 1

## 2015-05-02 MED ORDER — ENOXAPARIN SODIUM 120 MG/0.8ML ~~LOC~~ SOLN
115.0000 mg | Freq: Two times a day (BID) | SUBCUTANEOUS | Status: DC
  Administered 2015-05-02 – 2015-05-03 (×2): 115 mg via SUBCUTANEOUS
  Filled 2015-05-02 (×2): qty 0.8

## 2015-05-02 SURGICAL SUPPLY — 9 items
CATH INFINITI 5FR ANG PIGTAIL (CATHETERS) ×3 IMPLANT
CATH INFINITI 5FR JL4 (CATHETERS) ×3 IMPLANT
CATH INFINITI JR4 5F (CATHETERS) ×3 IMPLANT
DEVICE CLOSURE MYNXGRIP 5F (Vascular Products) ×3 IMPLANT
KIT MANI 3VAL PERCEP (MISCELLANEOUS) ×3 IMPLANT
NEEDLE PERC 18GX7CM (NEEDLE) ×3 IMPLANT
PACK CARDIAC CATH (CUSTOM PROCEDURE TRAY) ×3 IMPLANT
SHEATH PINNACLE 5F 10CM (SHEATH) ×3 IMPLANT
WIRE EMERALD 3MM-J .035X150CM (WIRE) ×3 IMPLANT

## 2015-05-02 NOTE — ED Provider Notes (Signed)
[facility] Emergency Department Provider Note  ____________________________________________  Time seen: 2:05 PM  I have reviewed the triage vital signs and the nursing notes.   HISTORY  Chief Complaint Numbness    HPI Alan Gross is a 29 y.o. male presents with complaints of bilateral hand tingling that started 2 days ago and occurred again last night. He is slightly concerned because he had a headache yesterday He has no chest pain at all. He has no shortness of breath. He does have markedly elevated blood pressure but states that he ran out of his medication. He reports his EKGs are always concerning but he has had a catheterization in the past that was normal. He has no diaphoresis. He does report chills last night     Past Medical History  Diagnosis Date  . Hypertension   . Seizures     when he was a child    There are no active problems to display for this patient.   Past Surgical History  Procedure Laterality Date  . Coronary angioplasty      No current outpatient prescriptions on file.  Allergies Review of patient's allergies indicates no known allergies.  History reviewed. No pertinent family history.  Social History History  Substance Use Topics  . Smoking status: Current Every Day Smoker  . Smokeless tobacco: Not on file  . Alcohol Use: Yes    Review of Systems  Constitutional: Negative for fever. Positive for chills Eyes: Negative for visual changes. ENT: Negative for sore throat Cardiovascular: Negative for chest pain. Respiratory: Negative for shortness of breath. Gastrointestinal: Negative for abdominal pain, vomiting and diarrhea. Genitourinary: Negative for dysuria. Musculoskeletal: Negative for back pain. Skin: Negative for rash. Neurological: Other for headache, negative for focal weakness. Tingling in both hands Psychiatric: No anxiety  10-point ROS otherwise  negative.  ____________________________________________   PHYSICAL EXAM:  VITAL SIGNS: ED Triage Vitals  Enc Vitals Group     BP 05/02/15 1316 207/115 mmHg     Pulse Rate 05/02/15 1316 63     Resp 05/02/15 1316 20     Temp 05/02/15 1316 98.4 F (36.9 C)     Temp Source 05/02/15 1316 Oral     SpO2 05/02/15 1316 98 %     Weight 05/02/15 1316 250 lb (113.399 kg)     Height 05/02/15 1316 6\' 1"  (1.854 m)     Head Cir --      Peak Flow --      Pain Score 05/02/15 1316 9     Pain Loc --      Pain Edu? --      Excl. in [place]? --    Constitutional: Alert and oriented. Well appearing and in no distress. Eyes: Conjunctivae are normal. PERRL. ENT   Head: Normocephalic and atraumatic.   Nose: No rhinnorhea.   Mouth/Throat: Mucous membranes are moist. Cardiovascular: Normal rate, regular rhythm. Normal and symmetric distal pulses are present in all extremities. No murmurs, rubs, or gallops. Respiratory: Normal respiratory effort without tachypnea nor retractions. Breath sounds are clear and equal bilaterally.  Gastrointestinal: Soft and non-tender in all quadrants. No distention. There is no CVA tenderness. Genitourinary: deferred Musculoskeletal: Nontender with normal range of motion in all extremities. No lower extremity tenderness nor edema. Neurologic:  Normal speech and language. No gross focal neurologic deficits are appreciated. Skin:  Skin is warm, dry and intact. No rash noted. Psychiatric: Mood and affect are normal. Patient exhibits appropriate insight and judgment.  ____________________________________________  LABS (pertinent positives/negatives)  Labs Reviewed  CBC WITH DIFFERENTIAL/PLATELET - Abnormal; Notable for the following:    WBC 13.6 (*)    Platelets 117 (*)    Neutro Abs 11.5 (*)    All other components within normal limits  BASIC METABOLIC PANEL - Abnormal; Notable for the following:    Potassium 2.7 (*)    Chloride 96 (*)    CO2 33 (*)     Glucose, Bld 154 (*)    All other components within normal limits  TROPONIN I    ____________________________________________   EKG  Strongly concerning and abnormal EKG  ED ECG REPORT I, Lavonia Drafts, the attending physician, personally viewed and interpreted this ECG.   Date: 05/02/2015  EKG Time: 1:23 PM  Rate: 69  Rhythm: normal sinus rhythm, left axis deviation  Axis: Axis deviation  Intervals:none  ST&T Change: ST elevations in V2 V3 and V4  Reviewed old EKGs and cardiology's note from January which reports similar presentation  ____________________________________________    RADIOLOGY  Stat echo being performed right now  ____________________________________________   PROCEDURES  Procedure(s) performed: none  Critical Care performed: yes CRITICAL CARE Performed by: Lavonia Drafts   Total critical care time: 30  Critical care time was exclusive of separately billable procedures and treating other patients.  Critical care was necessary to treat or prevent imminent or life-threatening deterioration.  Critical care was time spent personally by me on the following activities: development of treatment plan with patient and/or surrogate as well as nursing, discussions with consultants, evaluation of patient's response to treatment, examination of patient, obtaining history from patient or surrogate, ordering and performing treatments and interventions, ordering and review of laboratory studies, ordering and review of radiographic studies, pulse oximetry and re-evaluation of patient's condition.   ____________________________________________   INITIAL IMPRESSION / ASSESSMENT AND PLAN / ED COURSE  Pertinent labs & imaging results that were available during my care of the patient were reviewed by me and considered in my medical decision making (see chart for details).  Patient seen by me at 2:05 PM, upon looking at the EKG I became concerned regarding ACS  however patient has no chest pain at all. And his troponin is normal. He also reports he has gotten similar reaction to his EKG in the past.  Discussed with Dr. Humphrey Rolls of cardiology. He will see the patient in the ED. He requests a stat echo. We will give ASA in the meantime. We will treat blood pressure with labetalol IV  ----------------------------------------- 3:14 PM on 05/02/2015 -----------------------------------------  Dr. Humphrey Rolls in to see patient ____________________________________________  Dr. Humphrey Rolls to take the patient to the Cath Lab given significant EKG changes from last available.   FINAL CLINICAL IMPRESSION(S) / ED DIAGNOSES  Final diagnoses:  Malignant hypertension     Lavonia Drafts, MD 05/02/15 1524

## 2015-05-02 NOTE — Progress Notes (Signed)
Report called to nurse cheryl

## 2015-05-02 NOTE — Progress Notes (Signed)
ANTICOAGULATION CONSULT NOTE - Initial Consult  Pharmacy Consult for Lovenox Indication: chest pain/ACS  No Known Allergies  Patient Measurements: Height: 6' (182.9 cm) Weight: 250 lb (113.399 kg) IBW/kg (Calculated) : 77.6 Heparin Dosing Weight:   Vital Signs: Temp: 98.5 F (36.9 C) (06/14 1613) Temp Source: Oral (06/14 1613) BP: 187/118 mmHg (06/14 1845) Pulse Rate: 78 (06/14 1613)  Labs:  Recent Labs  05/02/15 1329  HGB 14.0  HCT 41.7  PLT 117*  CREATININE 1.24  TROPONINI <0.03    Estimated Creatinine Clearance: 112.2 mL/min (by C-G formula based on Cr of 1.24).   Medical History: Past Medical History  Diagnosis Date  . Hypertension   . Seizures     when he was a child    Medications:  No prescriptions prior to admission    Assessment: ACS CrCl = 112.2 ml/min Wt = 113.4 kg  Goal of Therapy:  ACS treatment Monitor platelets by anticoagulation protocol: Yes   Plan:  Will start lovenox 115 mg SQ Q12H. Will check CBC  on 6/15 with AM labs.   Wilmary Levit D 05/02/2015,7:35 PM

## 2015-05-02 NOTE — Progress Notes (Signed)
*  PRELIMINARY RESULTS* Echocardiogram 2D Echocardiogram has been performed.  Alan Gross 05/02/2015, 3:54 PM

## 2015-05-02 NOTE — H&P (Addendum)
[place] [facility] [place] NAME: Alan Gross    MR#:  [id number]  DATE OF BIRTH:  Sep 28, 1986  DATE OF ADMISSION:  05/02/2015  PRIMARY CARE PHYSICIAN: No primary care provider on file.   REQUESTING/REFERRING PHYSICIAN: Lavonia Drafts, MD  CHIEF COMPLAINT:   Chief Complaint  Patient presents with  . Numbness   bilateral hand numbness and generalized weakness for 2 days.  HISTORY OF PRESENT ILLNESS:  Alan Gross  is a 29 y.o. male with a known history of hypertension and a seizure disorder. The patient presents to the ED with the bilateral hands numbness and tingling and generalized weakness for 2 days. The patient had elevated blood pressure more than 200 and was found abnormal EKG which show ST elevation in anterior lead about 1-2 mm associated with some T-wave you worsen in the lead 1-3 and aVF. Troponin level is normal.  The patient denies any chest pain, palpitation or leg edema. Dr. Humphrey Rolls suggested emergent cardiac catheter for ACS.  PAST MEDICAL HISTORY:   Past Medical History  Diagnosis Date  . Hypertension   . Seizures     when he was a child    PAST SURGICAL HISTORY:   Past Surgical History  Procedure Laterality Date  . Coronary angioplasty      SOCIAL HISTORY:   History  Substance Use Topics  . Smoking status: Current Every Day Smoker  . Smokeless tobacco: Current User    Last Attempt to Quit: 05/02/2015  . Alcohol Use: Yes  Smokes one pack a day for many years, denies any alcohol drinking or illicit drugs.  FAMILY HISTORY:  History reviewed. No pertinent family history.No hypertension, diabetes, heart attack or stroke  DRUG ALLERGIES:  No Known Allergies  REVIEW OF SYSTEMS:  CONSTITUTIONAL: No fever,generalized  weakness.  EYES: No blurred or double vision.  EARS, NOSE, AND THROAT: No tinnitus or ear pain.  RESPIRATORY: No cough, shortness of breath, wheezing or hemoptysis.  CARDIOVASCULAR: No chest  pain, orthopnea, edema.  GASTROINTESTINAL: No nausea, vomiting, diarrhea or abdominal pain.  GENITOURINARY: No dysuria, hematuria.  ENDOCRINE: No polyuria, nocturia,  HEMATOLOGY: No anemia, easy bruising or bleeding SKIN: No rash or lesion. MUSCULOSKELETAL: No joint pain or arthritis.   NEUROLOGIC:Bilateral hand  tingling and numbness,  with generalized weakness.  PSYCHIATRY: No anxiety or depression.   MEDICATIONS AT HOME:   Prior to Admission medications   Not on File      VITAL SIGNS:  Blood pressure 185/112, pulse 78, temperature 98.5 F (36.9 C), temperature source Oral, resp. rate 20, height 6' (1.829 m), weight 113.399 kg (250 lb), SpO2 99 %.  PHYSICAL EXAMINATION:  GENERAL:  29 y.o.-year-old patient lying in the bed with no acute distress.  EYES: Pupils equal, round, reactive to light and accommodation. No scleral icterus. Extraocular muscles intact.  HEENT: Head atraumatic, normocephalic. Oropharynx and nasopharynx clear.  NECK:  Supple, no jugular venous distention. No thyroid enlargement, no tenderness.  LUNGS: Normal breath sounds bilaterally, no wheezing, rales,rhonchi or crepitation. No use of accessory muscles of respiration.  CARDIOVASCULAR: S1, S2 normal. No murmurs, rubs, or gallops.  ABDOMEN: Soft, nontender, nondistended. Bowel sounds present. No organomegaly or mass.  EXTREMITIES: No pedal edema, cyanosis, or clubbing.  NEUROLOGIC: Cranial nerves II through XII are intact. Muscle strength 5/5 in all extremities. Sensation intact. Gait not checked.  PSYCHIATRIC: The patient is alert and oriented x 3.  SKIN: No obvious rash, lesion, or ulcer.  LABORATORY PANEL:   CBC  Recent Labs Lab 05/02/15 1329  WBC 13.6*  HGB 14.0  HCT 41.7  PLT 117*   ------------------------------------------------------------------------------------------------------------------  Chemistries   Recent Labs Lab 05/02/15 1329  NA 136  K 2.7*  CL 96*  CO2 33*  GLUCOSE  154*  BUN 12  CREATININE 1.24  CALCIUM 9.0   ------------------------------------------------------------------------------------------------------------------  Cardiac Enzymes  Recent Labs Lab 05/02/15 1329  TROPONINI <0.03   ------------------------------------------------------------------------------------------------------------------  RADIOLOGY:  No results found.  EKG:  No orders found for this or any previous visit.  IMPRESSION AND PLAN:   ACS, possible non-STEMI. Malignant hypertension Hypokalemia Leukocytosis Tobacco abuse Drug abuse  The patient will be admitted to telemetry floor and continued telemetry monitor. The patient is transported to catheter lab to get cardiac catheter per Dr. Laurelyn Sickle recommendation. I will start aspirin, Lipitor and heparin drip, follow-up troponin level. In addition, I will start Lopressor and lisinopril with IV hydralazine when necessary for malignant hypertension. I will give potassium supplement and check potassium and the magnesium level. Leukocytosis, unclear ideology, I will get a urinalysis and urine drug screen, and follow up CBC. Smoking cessation was counseled for 3 minutes, I will give nicotine patch.        All the records are reviewed and case discussed with ED provider. Management plans discussed with the patient, family and they are in agreement.  I discussed with Dr. Humphrey Rolls, cardiologist.  CODE STATUS:Full code TOTAL TIME TAKING CARE OF THIS PATIENT:52 minutes.    Demetrios Loll M.D on 05/02/2015 at 4:50 PM  Between 7am to 6pm - Pager - [phone]  After 6pm go to [email or web address] - password [email or web address] [facility]  [facility] [facility]  Office  [phone]  CC: Primary care physician; No primary care provider on file.

## 2015-05-02 NOTE — ED Notes (Signed)
Pt reports that he was at work yesterday, both of his arms went numb. He has had a headache for the last three days with dizziness. His blood pressure is elevated, states that it stays elevated, he was on blood pressure medications but has ran out of recently.

## 2015-05-02 NOTE — Progress Notes (Signed)
Called dr Humphrey Rolls to inform bp 180s/100, pt wanting to go home, md states hospitalist has been consulted for admission. Called hospitalist dr Bridgett Larsson to inform pt wanting to go home. Explained to pt and girlfriend both mds discussed need for admission, both pt and girlfriend recall discussion commenting they no longer thought there was a need since cardiac cath was normal. Pt agrees to admission

## 2015-05-02 NOTE — Progress Notes (Signed)
Alan Gross is a 29 y.o. male  [id number]  Primary Cardiologist: Neoma Laming Reason for Consultation: Abnormal EKG  HPI: This is a 29 year old African-American male with a past medical history of hypertension and marijuana use over the weekend presented to the emergency room with tingling sensation and shortness of breath. Patient apparently had elevated blood pressure and concerning EKG thus I was asked to reevaluate the patient. Patient had ST elevations in the anterior leads about 1-2 mm associated with some T wave inversion in lead 1-3 and aVF. Patient denied chest pain but had some tingling sensation in his arms and shortness of breath. Repeat EKGs continued to have ST elevation but he denied chest pain. 4 years ago patient had a cardiac catheterization done by Dr. Rejeana Brock ordered and was apparently normal. His EKG however from 2011 and now is significantly different with left with more ST elevation.    Review of Systems: Review of Systems  Respiratory: Positive for shortness of breath.   Neurological: Positive for dizziness, tingling and loss of consciousness.      Past Medical History  Diagnosis Date  . Hypertension   . Seizures     when he was a child     (Not in a hospital admission)   . potassium chloride  40 mEq Oral Once  . sodium chloride  3 mL Intravenous Q12H    Infusions: . sodium chloride    . sodium chloride    . D-10-0.45% Sodium Chloride with KCL 40 meq/L 1000 ml      No Known Allergies  History   Social History  . Marital Status: Single    Spouse Name: N/A  . Number of Children: N/A  . Years of Education: N/A   Occupational History  . Not on file.   Social History Main Topics  . Smoking status: Current Every Day Smoker  . Smokeless tobacco: Not on file  . Alcohol Use: Yes  . Drug Use: Yes    Special: Marijuana  . Sexual Activity: Not on file   Other Topics Concern  . Not on file   Social History Narrative  . No narrative on file     History reviewed. No pertinent family history.  PHYSICAL EXAM: Filed Vitals:   05/02/15 1502  BP: 176/110  Pulse: 64  Temp:   Resp: 18    No intake or output data in the 24 hours ending 05/02/15 1536  General:  Well appearing. No respiratory difficulty HEENT: normal Neck: supple. no JVD. Carotids 2+ bilat; no bruits. No lymphadenopathy or thryomegaly appreciated. Cor: PMI nondisplaced. Regular rate & rhythm. No rubs, gallops or murmurs. Lungs: clear Abdomen: soft, nontender, nondistended. No hepatosplenomegaly. No bruits or masses. Good bowel sounds. Extremities: no cyanosis, clubbing, rash, edema Neuro: alert & oriented x 3, cranial nerves grossly intact. moves all 4 extremities w/o difficulty. Affect pleasant.  ECG: Normal sinus rhythm 67 bpm with 2 mm ST elevation V1 to be 4 with T wave inversion in leads 23 aVF and 1 and aVL KG done and not 2011 had minor early repolarization abnormalities apparently the ST elevations are more significant now than 2011.  Results for orders placed or performed during the hospital encounter of 05/02/15 (from the past 24 hour(s))  CBC with Differential/Platelet     Status: Abnormal   Collection Time: 05/02/15  1:29 PM  Result Value Ref Range   WBC 13.6 (H) 3.8 - 10.6 K/uL   RBC 4.89 4.40 - 5.90 MIL/uL  Hemoglobin 14.0 13.0 - 18.0 g/dL   HCT 41.7 40.0 - 52.0 %   MCV 85.4 80.0 - 100.0 fL   MCH 28.6 26.0 - 34.0 pg   MCHC 33.5 32.0 - 36.0 g/dL   RDW 13.2 11.5 - 14.5 %   Platelets 117 (L) 150 - 440 K/uL   Neutrophils Relative % 84 %   Neutro Abs 11.5 (H) 1.4 - 6.5 K/uL   Lymphocytes Relative 9 %   Lymphs Abs 1.2 1.0 - 3.6 K/uL   Monocytes Relative 6 %   Monocytes Absolute 0.8 0.2 - 1.0 K/uL   Eosinophils Relative 0 %   Eosinophils Absolute 0.0 0 - 0.7 K/uL   Basophils Relative 1 %   Basophils Absolute 0.1 0 - 0.1 K/uL  Basic metabolic panel     Status: Abnormal   Collection Time: 05/02/15  1:29 PM  Result Value Ref Range   Sodium  136 135 - 145 mmol/L   Potassium 2.7 (LL) 3.5 - 5.1 mmol/L   Chloride 96 (L) 101 - 111 mmol/L   CO2 33 (H) 22 - 32 mmol/L   Glucose, Bld 154 (H) 65 - 99 mg/dL   BUN 12 6 - 20 mg/dL   Creatinine, Ser 1.24 0.61 - 1.24 mg/dL   Calcium 9.0 8.9 - 10.3 mg/dL   GFR calc non Af Amer >60 >60 mL/min   GFR calc Af Amer >60 >60 mL/min   Anion gap 7 5 - 15  Troponin I     Status: None   Collection Time: 05/02/15  1:29 PM  Result Value Ref Range   Troponin I <0.03 <0.031 ng/mL   No results found.   ASSESSMENT AND PLAN: Possible acute coronary syndrome with ST elevation and use of marijuana in the past couple of days. It is some concerning EKG changes with patient having tingling sensation in his arms and shortness of breath. Advise repeating cardiac catheterization to be sure that there is no coronary disease and also get an echocardiogram  Crystelle Ferrufino A

## 2015-05-03 LAB — URINE DRUG SCREEN, QUALITATIVE (ARMC ONLY)
Amphetamines, Ur Screen: NOT DETECTED
BARBITURATES, UR SCREEN: NOT DETECTED
BENZODIAZEPINE, UR SCRN: POSITIVE — AB
CANNABINOID 50 NG, UR ~~LOC~~: POSITIVE — AB
Cocaine Metabolite,Ur ~~LOC~~: NOT DETECTED
MDMA (Ecstasy)Ur Screen: NOT DETECTED
METHADONE SCREEN, URINE: NOT DETECTED
Opiate, Ur Screen: NOT DETECTED
Phencyclidine (PCP) Ur S: NOT DETECTED
TRICYCLIC, UR SCREEN: NOT DETECTED

## 2015-05-03 LAB — URINALYSIS COMPLETE WITH MICROSCOPIC (ARMC ONLY)
BILIRUBIN URINE: NEGATIVE
Bacteria, UA: NONE SEEN
Glucose, UA: 50 mg/dL — AB
HGB URINE DIPSTICK: NEGATIVE
Ketones, ur: NEGATIVE mg/dL
LEUKOCYTES UA: NEGATIVE
Nitrite: NEGATIVE
PH: 6 (ref 5.0–8.0)
Protein, ur: NEGATIVE mg/dL
Specific Gravity, Urine: 1.013 (ref 1.005–1.030)

## 2015-05-03 LAB — CBC
HCT: 41.4 % (ref 40.0–52.0)
Hemoglobin: 13.5 g/dL (ref 13.0–18.0)
MCH: 27.9 pg (ref 26.0–34.0)
MCHC: 32.5 g/dL (ref 32.0–36.0)
MCV: 85.9 fL (ref 80.0–100.0)
Platelets: 113 10*3/uL — ABNORMAL LOW (ref 150–440)
RBC: 4.82 MIL/uL (ref 4.40–5.90)
RDW: 13.7 % (ref 11.5–14.5)
WBC: 10.8 10*3/uL — ABNORMAL HIGH (ref 3.8–10.6)

## 2015-05-03 LAB — LIPID PANEL
Cholesterol: 146 mg/dL (ref 0–200)
HDL: 40 mg/dL — ABNORMAL LOW (ref >40)
LDL Cholesterol: 96 mg/dL (ref 0–99)
TRIGLYCERIDES: 51 mg/dL (ref <150)
Total CHOL/HDL Ratio: 3.7 RATIO
VLDL: 10 mg/dL (ref 0–40)

## 2015-05-03 LAB — BASIC METABOLIC PANEL
Anion gap: 9 (ref 5–15)
BUN: 8 mg/dL (ref 6–20)
CALCIUM: 8.7 mg/dL — AB (ref 8.9–10.3)
CO2: 29 mmol/L (ref 22–32)
CREATININE: 1.08 mg/dL (ref 0.61–1.24)
Chloride: 101 mmol/L (ref 101–111)
GFR calc Af Amer: >60 mL/min (ref >60)
GFR calc non Af Amer: >60 mL/min (ref >60)
GLUCOSE: 143 mg/dL — AB (ref 65–99)
Potassium: 2.9 mmol/L — CL (ref 3.5–5.1)
Sodium: 139 mmol/L (ref 135–145)

## 2015-05-03 LAB — TROPONIN I
TROPONIN I: 0.09 ng/mL — AB (ref <0.031)
Troponin I: 0.08 ng/mL — ABNORMAL HIGH (ref <0.031)

## 2015-05-03 LAB — POTASSIUM: Potassium: 3.2 mmol/L — ABNORMAL LOW (ref 3.5–5.1)

## 2015-05-03 LAB — HEMOGLOBIN A1C: Hgb A1c MFr Bld: 5.4 % (ref 4.0–6.0)

## 2015-05-03 MED ORDER — ACETAMINOPHEN-CODEINE #3 300-30 MG PO TABS
1.0000 | ORAL_TABLET | Freq: Four times a day (QID) | ORAL | Status: DC | PRN
  Administered 2015-05-03: 1 via ORAL
  Filled 2015-05-03: qty 1

## 2015-05-03 MED ORDER — HYDRALAZINE HCL 50 MG PO TABS
50.0000 mg | ORAL_TABLET | Freq: Three times a day (TID) | ORAL | Status: DC
  Administered 2015-05-03 – 2015-05-04 (×3): 50 mg via ORAL
  Filled 2015-05-03 (×3): qty 1

## 2015-05-03 MED ORDER — POTASSIUM CHLORIDE CRYS ER 20 MEQ PO TBCR
60.0000 meq | EXTENDED_RELEASE_TABLET | Freq: Once | ORAL | Status: AC
  Administered 2015-05-03: 60 meq via ORAL
  Filled 2015-05-03: qty 3

## 2015-05-03 MED ORDER — BUTALBITAL-APAP-CAFFEINE 50-325-40 MG PO TABS
2.0000 | ORAL_TABLET | Freq: Four times a day (QID) | ORAL | Status: DC | PRN
  Administered 2015-05-03 (×2): 2 via ORAL
  Filled 2015-05-03 (×2): qty 2

## 2015-05-03 MED ORDER — ASPIRIN-ACETAMINOPHEN-CAFFEINE 250-250-65 MG PO TABS: 2.0000 | ORAL_TABLET | Freq: Four times a day (QID) | ORAL | Status: DC | PRN

## 2015-05-03 NOTE — Consult Note (Signed)
ENDOCRINOLOGY CONSULTATION  REFERRING PHYSICIAN: Max Sane, MD CONSULTING PHYSICIAN:  A. Lavone Orn, MD.  CHIEF COMPLAINT:  HTN, sweats, low potassium  HISTORY OF PRESENT ILLNESS:  29 y.o. male seen in consultation for for the above complaints. Alan Gross has a long h/o uncontrolled HTN. He was hospitalized at this facility in 2013 for chest pain and uncontrolled HTN, at which time he underwent cardiac cath which was negative and was discharged on a beta blocker. He did not have consistent follow up and did not continue with blood pressure medication. He was admitted yesterday with a complaint of b/l hand numbness. Reports that on Sunday, he ate at a cookout, drank a small amount of alcohol and smoked marijuana. Later that evening he had onset of bilat hand numbness, generalized weakness and headache. On admission, he had BP in the 180/110 range, hypokalemia with potassium of 2.7, glucose 154, and there was concern for ST elevation on EKG. He underwent a repeat cardiac cath yesterday which again showed normal coronaries. Echocardiogram significant for LVH with normal EF. He has been started on hydralazine, metoprolol, and lisinopril. BP much improved. After being given Excedrin migraine, his HA resolved. Potassium improved to 3.2 after replacement. Glucose fasting today again elevated at 143.  He denies polyuria and polydipsia. Reports approx 100 lb weight loss in last 2 yrs due to decreased intake. Works 3 jobs at times and reports some times feels too busy to eat. Describes himself as a heavy Designer, fashion/clothing. No episodic sweats or flushing. Had N/V today but this is unusual for him. Denies abd pain. He is a smoker and intends to quit.  PAST MEDICAL HISTORY:  Past Medical History  Diagnosis Date  . Hypertension   . Seizures     when he was a child     CURRENT MEDICATIONS:  . aspirin EC  81 mg Oral Daily  . atorvastatin  40 mg Oral q1800  . hydrALAZINE  50 mg Oral 3 times per day  . lisinopril   10 mg Oral Daily  . metoprolol tartrate  25 mg Oral BID  . metoprolol tartrate  25 mg Oral STAT  . nicotine  14 mg Transdermal Daily  . nicotine  21 mg Transdermal Daily     SOCIAL HISTORY:  Married and separated from wife. Has a 53 yo child. Works several jobs. Smokes 1 ppd cigarettes. Smokes marijuana approx 2x per month. Drinks alcohol only socially and not daily or weekly. Denies use of cocaine or other IV or inhaled drugs.  FAMILY HISTORY:   Brother also with HTN. Father died of liver disease, details not known. Grandfather with h/o AMI. Several other relatives with diabetes, stroke.  ALLERGIES:  No Known Allergies  REVIEW OF SYSTEMS:  GENERAL:  No weight loss.  No fever.  HEENT:  No blurred vision. No sore throat.  NECK:  No neck pain or dysphagia.  CARDIAC:  No chest pain or palpitation.  PULMONARY:  No cough or shortness of breath.  ABDOMEN:  No abdominal pain.  No constipation. EXTREMITIES:  No lower extremity swelling.  ENDOCRINE:  No heat or cold intolerance.  GENITOURINARY:  No dysuria or hematuria. SKIN:  No recent rash or skin changes.   PHYSICAL EXAMINATION:  BP 136/72 mmHg  Pulse 67  Temp(Src) 98 F (36.7 C) (Oral)  Resp 18  Ht 6' (1.829 m)  Wt 113.944 kg (251 lb 3.2 oz)  BMI 34.06 kg/m2  SpO2 100%  GENERAL:  Well-developed AA male in NAD. HEENT:  EOMI.  Oropharynx is clear.  NECK:  Supple.  No thyromegaly.  No neck tenderness. No thyroid mass or bruit. CARDIAC:  Regular rate and rhythm without murmur.  PULMONARY:  Clear to auscultation bilaterally. No wheeze. ABDOMEN:  Diffusely soft, nontender, nondistended. No abd bruit. EXTREMITIES:  No peripheral edema is present.    SKIN:  No rash or dermatopathy. NEUROLOGIC:  No dysarthria.  No tremor. PSYCHIATRIC:  Alert and oriented, calm, cooperative.   ASSESSMENT:  1. Cardiomyopathy due to long-standing uncontrolled HTN 2.   Uncontrolled HTN 3.   Tobacco dependence 4.   Hyperglycemia   PLAN: 1.  Young man with long standing uncontrolled HTN, very concerning for secondary HTN. Causes of secondary HTN that should be evaluated for include thyroid disease, RAS, hyperaldosteronism, and renal artery stenosis. Will obtain labs to include TSH, renin-aldo, and urine and plasma metanephrines and catecholamines. 2. Will screen for diabetes with Hgb A1c. 3. Will consider renal US with Doppler to eval for RAS as outpatient as radiology at this facility cannot perform this test. 4. Encouraged smoking cessation. 5. Continue aggressive treatment of HTN with medications to include ACE-I, as you are.  I am unavailable to see him in follow up tomorrow. Labs should be obtained over next 24 hrs. Will arrange for out-patient follow up in my clinic next week to review labs and determine next step. Will assist him in establishing with a primary care physician as well.  Thank you for the kind request for consultation.

## 2015-05-03 NOTE — Progress Notes (Signed)
Troponin level of 0.08 reported by Lab. MD, Jannifer Franklin made aware. Pt in room resting quietly at this time. No c/o pain or discomfort.

## 2015-05-03 NOTE — Progress Notes (Signed)
[place] dept notified that they don't do renal artery dopplers here in hospital. Pt can get that as an outpt only.

## 2015-05-03 NOTE — Progress Notes (Signed)
[place] at [place] NAME: Alan Gross    MR#:  [id number]  DATE OF BIRTH:  1986/06/28  SUBJECTIVE:  CHIEF COMPLAINT:   Chief Complaint  Patient presents with  . Numbness   having severe headache, also vomited and had excessive sweating while i was in room  REVIEW OF SYSTEMS:  Review of Systems  Constitutional: Negative for fever, weight loss, malaise/fatigue and diaphoresis.  HENT: Negative for ear discharge, ear pain, hearing loss, nosebleeds, sore throat and tinnitus.   Eyes: Negative for blurred vision and pain.  Respiratory: Negative for cough, hemoptysis, shortness of breath and wheezing.   Cardiovascular: Negative for chest pain, palpitations, orthopnea and leg swelling.  Gastrointestinal: Positive for nausea and vomiting. Negative for heartburn, abdominal pain, diarrhea, constipation and blood in stool.  Genitourinary: Negative for dysuria, urgency and frequency.  Musculoskeletal: Negative for myalgias and back pain.  Skin: Negative for itching and rash.  Neurological: Positive for headaches. Negative for dizziness, tingling, tremors, focal weakness, seizures and weakness.  Psychiatric/Behavioral: Negative for depression. The patient is not nervous/anxious.     DRUG ALLERGIES:  No Known Allergies VITALS:  Blood pressure 136/72, pulse 67, temperature 98 F (36.7 C), temperature source Oral, resp. rate 18, height 6' (1.829 m), weight 113.944 kg (251 lb 3.2 oz), SpO2 100 %. PHYSICAL EXAMINATION:  Physical Exam  Constitutional: He is oriented to person, place, and time and well-developed, well-nourished, and in no distress. Vital signs are normal. He appears to be writhing in pain. He appears unhealthy. He appears toxic. He has a sickly appearance.  Neurological: He is oriented to person, place, and time.  Skin: He is diaphoretic.   LABORATORY PANEL:   CBC  Recent Labs Lab 05/03/15 0800  WBC 10.8*  HGB 13.5   HCT 41.4  PLT 113*   ------------------------------------------------------------------------------------------------------------------ Chemistries   Recent Labs Lab 05/02/15 1931 05/03/15 0800 05/03/15 1318  NA  --  139  --   K  --  2.9* 3.2*  CL  --  101  --   CO2  --  29  --   GLUCOSE  --  143*  --   BUN  --  8  --   CREATININE  --  1.08  --   CALCIUM  --  8.7*  --   MG 1.9  --   --    RADIOLOGY:  No results found. ASSESSMENT AND PLAN:   * suspected ACS, possible non-STEMI: on admission, ruled out, cath showed no CAD. Echo wnl except severe ventricular hypertrophy.  * Severe episodic headache, excessive sweating, and severe paroxysmal hypertension: Worrisome for possible pheochromocytoma, he also has his severe hypokalemia which is also concerning for primary hyperaldosteronism. Will get endocrine c/s - d/w dr Gabriel Carina.  Will try Fioricet for headache  * Hypokalemia: Replete and recheck, worrisome for primary hyperaldosteronism  * Leukocytosis: could be due to stress. monitor  * Tobacco abuse: counselled for 3 mins, not ready to quit.  * Drug abuse: UDA + for cannabinoid ad benzo's   All the records are reviewed and case discussed with Care Management/Social Workerr. Management plans discussed with the patient, family and they are in agreement.  CODE STATUS: FULL CODE  TOTAL TIME TAKING CARE OF THIS PATIENT: 35 minutes.   More than 50% of the time was spent in counseling/coordination of care: YES  POSSIBLE D/C IN 1-2 DAYS, DEPENDING ON CLINICAL CONDITION.   [facility], Maksim Peregoy M.D on 05/03/2015 at 5:21  PM  Between 7am to 6pm - Pager - [phone]  After 6pm go to [email or web address] - password [email or web address] [facility]  [place] [facility]  Office  [phone]  CC:  Primary care physician; No primary care provider on file.

## 2015-05-03 NOTE — Progress Notes (Signed)
SUBJECTIVE: Patient is having severe headache no chest pain or tingling sensation in his arms.   Filed Vitals:   05/02/15 2017 05/03/15 0044 05/03/15 0435 05/03/15 0737  BP:  168/100 184/103 151/76  Pulse:  71 74 75  Temp:   98.4 F (36.9 C)   TempSrc:   Oral   Resp:   18 18  Height:      Weight: 113.944 kg (251 lb 3.2 oz)     SpO2:  100% 100% 94%    Intake/Output Summary (Last 24 hours) at 05/03/15 0831 Last data filed at 05/03/15 0749  Gross per 24 hour  Intake 600.92 ml  Output   1200 ml  Net -599.08 ml    LABS: Basic Metabolic Panel:  Recent Labs  05/02/15 1329 05/02/15 1931  NA 136  --   K 2.7*  --   CL 96*  --   CO2 33*  --   GLUCOSE 154*  --   BUN 12  --   CREATININE 1.24  --   CALCIUM 9.0  --   MG  --  1.9   Liver Function Tests: No results for input(s): AST, ALT, ALKPHOS, BILITOT, PROT, ALBUMIN in the last 72 hours. No results for input(s): LIPASE, AMYLASE in the last 72 hours. CBC:  Recent Labs  05/02/15 1329  WBC 13.6*  NEUTROABS 11.5*  HGB 14.0  HCT 41.7  MCV 85.4  PLT 117*   Cardiac Enzymes:  Recent Labs  05/02/15 1329 05/02/15 1931 05/03/15 0048  TROPONINI <0.03 <0.03 0.08*   BNP: Invalid input(s): POCBNP D-Dimer: No results for input(s): DDIMER in the last 72 hours. Hemoglobin A1C: No results for input(s): HGBA1C in the last 72 hours. Fasting Lipid Panel: No results for input(s): CHOL, HDL, LDLCALC, TRIG, CHOLHDL, LDLDIRECT in the last 72 hours. Thyroid Function Tests: No results for input(s): TSH, T4TOTAL, T3FREE, THYROIDAB in the last 72 hours.  Invalid input(s): FREET3 Anemia Panel: No results for input(s): VITAMINB12, FOLATE, FERRITIN, TIBC, IRON, RETICCTPCT in the last 72 hours.   PHYSICAL EXAM General: Well developed, well nourished, in no acute distress HEENT:  Normocephalic and atramatic Neck:  No JVD.  Lungs: Clear bilaterally to auscultation and percussion. Heart: HRRR . Normal S1 and S2 without gallops or  murmurs.  Abdomen: Bowel sounds are positive, abdomen soft and non-tender  Msk:  Back normal, normal gait. Normal strength and tone for age. Extremities: No clubbing, cyanosis or edema.   Neuro: Alert and oriented X 3. Psych:  Good affect, responds appropriately  TELEMETRY: Monitor shows sinus rhythm  ASSESSMENT AND PLAN: Uncontrolled hypertension with the ST elevation due to early repolarization abnormalities and LVH. Echocardiogram showed normal left systolic function with severe Left ventricle hypertrophy. Cardiac catheterization showed normal coronaries. Patient's headaches are related to severe hypertension advise placing the patient on hydralazine and labetalol and once the blood pressure is controlled with follow-up in the office next week on Tuesday at 10:00  Active Problems:   ACS (acute coronary syndrome)    Alan David, MD, [facility] 05/03/2015 8:31 AM

## 2015-05-03 NOTE — Progress Notes (Signed)
Up to BR and tolerated it well. A & O. Urinal. Takes meds ok. BP is improved. Pt complained of a HA and received multi pain meds. Pt has no further concerns at this time.

## 2015-05-03 NOTE — Progress Notes (Signed)
Notified MD Manuella Ghazi about K 2.9 and HA 10/10, will follow

## 2015-05-04 ENCOUNTER — Encounter: Payer: Self-pay | Admitting: Cardiovascular Disease

## 2015-05-04 LAB — TSH: TSH: 1.893 u[IU]/mL (ref 0.350–4.500)

## 2015-05-04 LAB — POTASSIUM: Potassium: 3.2 mmol/L — ABNORMAL LOW (ref 3.5–5.1)

## 2015-05-04 LAB — HEMOGLOBIN A1C: Hgb A1c MFr Bld: 5.4 % (ref 4.0–6.0)

## 2015-05-04 MED ORDER — HYDRALAZINE HCL 50 MG PO TABS: 50.0000 mg | ORAL_TABLET | Freq: Three times a day (TID) | ORAL | Status: DC

## 2015-05-04 MED ORDER — METOPROLOL TARTRATE 25 MG PO TABS: 25.0000 mg | ORAL_TABLET | Freq: Two times a day (BID) | ORAL | Status: DC

## 2015-05-04 MED ORDER — LISINOPRIL 10 MG PO TABS: 10.0000 mg | ORAL_TABLET | Freq: Every day | ORAL | Status: DC

## 2015-05-04 NOTE — Progress Notes (Signed)
SUBJECTIVE: feeling much better   Filed Vitals:   05/03/15 2005 05/04/15 0506 05/04/15 0545 05/04/15 0708  BP: 157/93 145/72  163/92  Pulse: 60 49  52  Temp:  98.6 F (37 C)    TempSrc:      Resp: 20 19  18   Height:      Weight:   113.898 kg (251 lb 1.6 oz)   SpO2: 100%   100%    Intake/Output Summary (Last 24 hours) at 05/04/15 1025 Last data filed at 05/03/15 2350  Gross per 24 hour  Intake    875 ml  Output   1150 ml  Net   -275 ml    LABS: Basic Metabolic Panel:  Recent Labs  05/02/15 1329 05/02/15 1931 05/03/15 0800 05/03/15 1318 05/04/15 0608  NA 136  --  139  --   --   K 2.7*  --  2.9* 3.2* 3.2*  CL 96*  --  101  --   --   CO2 33*  --  29  --   --   GLUCOSE 154*  --  143*  --   --   BUN 12  --  8  --   --   CREATININE 1.24  --  1.08  --   --   CALCIUM 9.0  --  8.7*  --   --   MG  --  1.9  --   --   --    Liver Function Tests: No results for input(s): AST, ALT, ALKPHOS, BILITOT, PROT, ALBUMIN in the last 72 hours. No results for input(s): LIPASE, AMYLASE in the last 72 hours. CBC:  Recent Labs  05/02/15 1329 05/03/15 0800  WBC 13.6* 10.8*  NEUTROABS 11.5*  --   HGB 14.0 13.5  HCT 41.7 41.4  MCV 85.4 85.9  PLT 117* 113*   Cardiac Enzymes:  Recent Labs  05/02/15 1931 05/03/15 0048 05/03/15 0800  TROPONINI <0.03 0.08* 0.09*   BNP: Invalid input(s): POCBNP D-Dimer: No results for input(s): DDIMER in the last 72 hours. Hemoglobin A1C:  Recent Labs  05/02/15 1931  HGBA1C 5.4   Fasting Lipid Panel:  Recent Labs  05/03/15 0800  CHOL 146  HDL 40*  LDLCALC 96  TRIG 51  CHOLHDL 3.7   Thyroid Function Tests:  Recent Labs  05/04/15 0608  TSH 1.893   Anemia Panel: No results for input(s): VITAMINB12, FOLATE, FERRITIN, TIBC, IRON, RETICCTPCT in the last 72 hours.   PHYSICAL EXAM General: Well developed, well nourished, in no acute distress HEENT:  Normocephalic and atramatic Neck:  No JVD.  Lungs: Clear bilaterally to  auscultation and percussion. Heart: HRRR . Normal S1 and S2 without gallops or murmurs.  Abdomen: Bowel sounds are positive, abdomen soft and non-tender  Msk:  Back normal, normal gait. Normal strength and tone for age. Extremities: No clubbing, cyanosis or edema.   Neuro: Alert and oriented X 3. Psych:  Good affect, responds appropriately  TELEMETRY:NSR  ASSESSMENT AND PLAN:  Alan David, MD Physician Signed Cardiology Progress Notes 05/03/2015 8:30 AM    Expand All Collapse All   SUBJECTIVE: Patient is having severe headache no chest pain or tingling sensation in his arms.   Filed Vitals:   05/02/15 2017 05/03/15 0044 05/03/15 0435 05/03/15 0737  BP:  168/100 184/103 151/76  Pulse:  71 74 75  Temp:   98.4 F (36.9 C)   TempSrc:   Oral   Resp:   18 18  Height:  Weight: 113.944 kg (251 lb 3.2 oz)     SpO2:  100% 100% 94%    Intake/Output Summary (Last 24 hours) at 05/03/15 0831 Last data filed at 05/03/15 0749  Gross per 24 hour  Intake 600.92 ml  Output  1200 ml  Net -599.08 ml    LABS: Basic Metabolic Panel:  Recent Labs (last 2 labs)      Recent Labs  05/02/15 1329 05/02/15 1931  NA 136 --   K 2.7* --   CL 96* --   CO2 33* --   GLUCOSE 154* --   BUN 12 --   CREATININE 1.24 --   CALCIUM 9.0 --   MG --  1.9     Liver Function Tests:  Recent Labs (last 2 labs)     No results for input(s): AST, ALT, ALKPHOS, BILITOT, PROT, ALBUMIN in the last 72 hours.    Recent Labs (last 2 labs)     No results for input(s): LIPASE, AMYLASE in the last 72 hours.   CBC:  Recent Labs (last 2 labs)      Recent Labs  05/02/15 1329  WBC 13.6*  NEUTROABS 11.5*  HGB 14.0  HCT 41.7  MCV 85.4  PLT 117*     Cardiac Enzymes:  Recent Labs (last 2 labs)      Recent Labs  05/02/15 1329 05/02/15 1931 05/03/15 0048  TROPONINI <0.03 <0.03  0.08*     BNP:  Recent Labs (last 2 labs)     Invalid input(s): POCBNP   D-Dimer:  Recent Labs (last 2 labs)     No results for input(s): DDIMER in the last 72 hours.   Hemoglobin A1C:  Recent Labs (last 2 labs)     No results for input(s): HGBA1C in the last 72 hours.   Fasting Lipid Panel:  Recent Labs (last 2 labs)     No results for input(s): CHOL, HDL, LDLCALC, TRIG, CHOLHDL, LDLDIRECT in the last 72 hours.   Thyroid Function Tests:  Recent Labs (last 2 labs)     No results for input(s): TSH, T4TOTAL, T3FREE, THYROIDAB in the last 72 hours.  Invalid input(s): FREET3   Anemia Panel:  Recent Labs (last 2 labs)     No results for input(s): VITAMINB12, FOLATE, FERRITIN, TIBC, IRON, RETICCTPCT in the last 72 hours.     PHYSICAL EXAM General: Well developed, well nourished, in no acute distress HEENT: Normocephalic and atramatic Neck: No JVD.  Lungs: Clear bilaterally to auscultation and percussion. Heart: HRRR . Normal S1 and S2 without gallops or murmurs.  Abdomen: Bowel sounds are positive, abdomen soft and non-tender  Msk: Back normal, normal gait. Normal strength and tone for age. Extremities: No clubbing, cyanosis or edema.  Neuro: Alert and oriented X 3. Psych: Good affect, responds appropriately  TELEMETRY: Monitor shows sinus rhythm  ASSESSMENT AND PLAN: Uncontrolled hypertension with the ST elevation due to early repolarization abnormalities and LVH. Echocardiogram showed normal left systolic function with severe Left ventricle hypertrophy. Cardiac catheterization showed normal coronaries. Patient's headaches are related to severe hypertension advise placing the patient on hydralazine and labetalol and once the blood pressure is controlled with follow-up in the office next week on Tuesday at 10:00  Active Problems:  ACS (acute coronary syndrome)    Alan David, MD, [facility] 05/03/2015 8:31 AM                  Active Problems:    ACS (acute coronary syndrome)  Alan David, MD, [facility] 05/04/2015 10:25 AM

## 2015-05-04 NOTE — Discharge Summary (Addendum)
[place] [facility] [place] NAME: Alan Gross    MR#:  [id number]  DATE OF BIRTH:  09-16-1986  DATE OF ADMISSION:  05/02/2015 ADMITTING PHYSICIAN: Dionisio David, MD  DATE OF DISCHARGE: 05/04/2015  PRIMARY CARE PHYSICIAN: Josue Hector, MD    ADMISSION DIAGNOSIS:  Malignant hypertension [I10]  DISCHARGE DIAGNOSIS:  Active Problems:   ACS (acute coronary syndrome)   SECONDARY DIAGNOSIS:   Past Medical History  Diagnosis Date  . Hypertension   . Seizures     when he was a child    HOSPITAL COURSE:  Patient is a 29 year old male with a known history of hypertension and a seizure disorder. The patient presents to the ED with the bilateral hands numbness and tingling and generalized weakness for 2 days.  He was evaluated by cardiology Dr. Humphrey Rolls who recommended cardiac catheter with some concern for acute coronary syndrome, underwent cardiac Catheterization which did not show any coronary disease.  His echocardiogram was also within normal limits with normal ejection fraction but showed severe left ventricular hypertrophy, likely from long-standing uncontrolled hypertension.  Due to his persistent hypokalemia, paroxysmal hypertension, severe headache, and excessive sweating.  His presentation was worrisome for possible underlying pheochromocytoma or primary hyperaldosteronism - secondary causes of hypertension.  This workup will need to be completed at Dr. Joycie Peek office as patient is adamant in wanting to go home today.  He understands the risks of leaving before completing workup.  I have also counseled him on not abusing any drugs.  He seemed surprised on getting benzodiazepine positive on urine drug screen and was blaming his girlfriend for that.  He is agreeable for discharge plan and is being discharged home in stable condition.  DISCHARGE CONDITIONS:   Stable  CONSULTS OBTAINED:    Judi Cong, MD Dionisio David, MD  DRUG  ALLERGIES:  No Known Allergies  DISCHARGE MEDICATIONS:   Current Discharge Medication List    START taking these medications   Details  hydrALAZINE (APRESOLINE) 50 MG tablet Take 1 tablet (50 mg total) by mouth 3 (three) times daily. Qty: 90 tablet, Refills: 0    lisinopril (PRINIVIL,ZESTRIL) 10 MG tablet Take 1 tablet (10 mg total) by mouth daily. Qty: 30 tablet, Refills: 0    metoprolol tartrate (LOPRESSOR) 25 MG tablet Take 1 tablet (25 mg total) by mouth 2 (two) times daily. Qty: 60 tablet, Refills: 0        DIET:  Cardiac diet  DISCHARGE CONDITION:  Good  ACTIVITY:  Activity as tolerated  OXYGEN:  Home Oxygen: No.   Oxygen Delivery: room air  DISCHARGE LOCATION:  home   If you experience worsening of your admission symptoms, develop shortness of breath, life threatening emergency, suicidal or homicidal thoughts you must seek medical attention immediately by calling 911 or calling your MD immediately  if symptoms less severe.  You Must read complete instructions/literature along with all the possible adverse reactions/side effects for all the Medicines you take and that have been prescribed to you. Take any new Medicines after you have completely understood and accpet all the possible adverse reactions/side effects.   Please note  You were cared for by a hospitalist during your hospital stay. If you have any questions about your discharge medications or the care you received while you were in the hospital after you are discharged, you can call the unit and asked to speak with the hospitalist on call if the hospitalist that took care  of you is not available. Once you are discharged, your primary care physician will handle any further medical issues. Please note that NO REFILLS for any discharge medications will be authorized once you are discharged, as it is imperative that you return to your primary care physician (or establish a relationship with a primary care  physician if you do not have one) for your aftercare needs so that they can reassess your need for medications and monitor your lab values.    On the day of Discharge:   VITAL SIGNS:  Blood pressure 163/92, pulse 52, temperature 98.6 F (37 C), temperature source Oral, resp. rate 18, height 6' (1.829 m), weight 113.898 kg (251 lb 1.6 oz), SpO2 100 %.  I/O:   Intake/Output Summary (Last 24 hours) at 05/04/15 1107 Last data filed at 05/03/15 2350  Gross per 24 hour  Intake    875 ml  Output   1150 ml  Net   -275 ml    PHYSICAL EXAMINATION:  GENERAL:  28 y.o.-year-old patient lying in the bed with no acute distress.  EYES: Pupils equal, round, reactive to light and accommodation. No scleral icterus. Extraocular muscles intact.  HEENT: Head atraumatic, normocephalic. Oropharynx and nasopharynx clear.  NECK:  Supple, no jugular venous distention. No thyroid enlargement, no tenderness.  LUNGS: Normal breath sounds bilaterally, no wheezing, rales,rhonchi or crepitation. No use of accessory muscles of respiration.  CARDIOVASCULAR: S1, S2 normal. No murmurs, rubs, or gallops.  ABDOMEN: Soft, non-tender, non-distended. Bowel sounds present. No organomegaly or mass.  EXTREMITIES: No pedal edema, cyanosis, or clubbing.  NEUROLOGIC: Cranial nerves II through XII are intact. Muscle strength 5/5 in all extremities. Sensation intact. Gait not checked.  PSYCHIATRIC: The patient is alert and oriented x 3.  SKIN: No obvious rash, lesion, or ulcer.   DATA REVIEW:   CBC  Recent Labs Lab 05/03/15 0800  WBC 10.8*  HGB 13.5  HCT 41.4  PLT 113*    Chemistries   Recent Labs Lab 05/02/15 1931 05/03/15 0800  05/04/15 0608  NA  --  139  --   --   K  --  2.9*  < > 3.2*  CL  --  101  --   --   CO2  --  29  --   --   GLUCOSE  --  143*  --   --   BUN  --  8  --   --   CREATININE  --  1.08  --   --   CALCIUM  --  8.7*  --   --   MG 1.9  --   --   --   < > = values in this interval not  displayed.  Cardiac Enzymes  Recent Labs Lab 05/03/15 0800  TROPONINI 0.09*    Management plans discussed with the patient, family and they are in agreement.  CODE STATUS:     Code Status Orders        Start     Ordered   05/02/15 1922  Full code   Continuous     05/02/15 1922      TOTAL TIME TAKING CARE OF THIS PATIENT: 55 minutes.    [facility], Tiye Huwe M.D on 05/04/2015 at 11:07 AM  Between 7am to 6pm - Pager - [phone]  After 6pm go to [email or web address] - password [email or web address] [facility]  [facility]  Office  [phone]  CC: Primary care physician; Judi Cong, MD Dionisio David, MD

## 2015-05-04 NOTE — Progress Notes (Signed)
VSS. IV and tele removed. Discharge instructions given to pt. Pt has not reported any pain. A & O. Takes meds ok. Pt to drive self home. Pt has no further concerns at this time.

## 2015-05-06 LAB — METANEPHRINES, URINE, 24 HOUR
Metaneph Total, Ur: 86 ug/L
Metanephrines, 24H Ur: 198 ug/24 hr (ref 45–290)
Normetanephrine, 24H Ur: 435 ug/24 hr (ref 82–500)
Normetanephrine, Ur: 189 ug/L

## 2015-05-07 LAB — METANEPHRINES, PLASMA
METANEPHRINE FREE: 19 pg/mL (ref 0–62)
NORMETANEPHRINE FREE: 33 pg/mL (ref 0–145)

## 2015-05-07 LAB — CATECHOLAMINES, FRACTIONATED, PLASMA
Dopamine: <30 pg/mL (ref 0–48)
Epinephrine: <15 pg/mL (ref 0–62)
Norepinephrine: 242 pg/mL (ref 0–874)

## 2015-05-07 LAB — ALDOSTERONE + RENIN ACTIVITY W/ RATIO
ALDO / PRA RATIO: 0.9 (ref 0.0–30.0)
ALDOSTERONE: 2.5 ng/dL (ref 0.0–30.0)
PRA LC/MS/MS: 2.83 ng/mL/hr

## 2015-07-14 ENCOUNTER — Emergency Department: Payer: 59

## 2015-07-14 ENCOUNTER — Emergency Department
Admission: EM | Admit: 2015-07-14 | Discharge: 2015-07-14 | Disposition: A | Discharge status: Home / Self Care | Payer: 59 | Attending: Emergency Medicine | Admitting: Emergency Medicine

## 2015-07-14 ENCOUNTER — Encounter: Payer: Self-pay | Admitting: Emergency Medicine

## 2015-07-14 ENCOUNTER — Other Ambulatory Visit: Payer: Self-pay

## 2015-07-14 DIAGNOSIS — R079 Chest pain, unspecified: Secondary | ICD-10-CM | POA: Insufficient documentation

## 2015-07-14 DIAGNOSIS — Z9889 Other specified postprocedural states: Secondary | ICD-10-CM | POA: Insufficient documentation

## 2015-07-14 DIAGNOSIS — Z720 Tobacco use: Secondary | ICD-10-CM | POA: Insufficient documentation

## 2015-07-14 DIAGNOSIS — F419 Anxiety disorder, unspecified: Secondary | ICD-10-CM | POA: Insufficient documentation

## 2015-07-14 DIAGNOSIS — Z9861 Coronary angioplasty status: Secondary | ICD-10-CM | POA: Insufficient documentation

## 2015-07-14 DIAGNOSIS — I10 Essential (primary) hypertension: Secondary | ICD-10-CM | POA: Insufficient documentation

## 2015-07-14 HISTORY — DX: Other specified postprocedural states: Z98.890

## 2015-07-14 LAB — COMPREHENSIVE METABOLIC PANEL
ALBUMIN: 4 g/dL (ref 3.5–5.0)
ALT: 19 U/L (ref 17–63)
ANION GAP: 7 (ref 5–15)
AST: 36 U/L (ref 15–41)
Alkaline Phosphatase: 66 U/L (ref 38–126)
BILIRUBIN TOTAL: 0.4 mg/dL (ref 0.3–1.2)
BUN: 19 mg/dL (ref 6–20)
CO2: 29 mmol/L (ref 22–32)
Calcium: 9.2 mg/dL (ref 8.9–10.3)
Chloride: 103 mmol/L (ref 101–111)
Creatinine, Ser: 1.3 mg/dL — ABNORMAL HIGH (ref 0.61–1.24)
Glucose, Bld: 100 mg/dL — ABNORMAL HIGH (ref 65–99)
POTASSIUM: 3.1 mmol/L — AB (ref 3.5–5.1)
Sodium: 139 mmol/L (ref 135–145)
TOTAL PROTEIN: 7.3 g/dL (ref 6.5–8.1)

## 2015-07-14 LAB — CBC WITH DIFFERENTIAL/PLATELET
BASOS PCT: 1 %
Basophils Absolute: 0.1 10*3/uL (ref 0–0.1)
Eosinophils Absolute: 0.2 10*3/uL (ref 0–0.7)
Eosinophils Relative: 2 %
HEMATOCRIT: 38.9 % — AB (ref 40.0–52.0)
Hemoglobin: 13.1 g/dL (ref 13.0–18.0)
Lymphocytes Relative: 21 %
Lymphs Abs: 2 10*3/uL (ref 1.0–3.6)
MCH: 28.4 pg (ref 26.0–34.0)
MCHC: 33.7 g/dL (ref 32.0–36.0)
MCV: 84.3 fL (ref 80.0–100.0)
MONO ABS: 1.1 10*3/uL — AB (ref 0.2–1.0)
MONOS PCT: 11 %
NEUTROS ABS: 6.3 10*3/uL (ref 1.4–6.5)
Neutrophils Relative %: 65 %
Platelets: 134 10*3/uL — ABNORMAL LOW (ref 150–440)
RBC: 4.62 MIL/uL (ref 4.40–5.90)
RDW: 14.4 % (ref 11.5–14.5)
WBC: 9.7 10*3/uL (ref 3.8–10.6)

## 2015-07-14 LAB — TROPONIN I: TROPONIN I: 0.03 ng/mL (ref <0.031)

## 2015-07-14 MED ORDER — HYDRALAZINE HCL 25 MG PO TABS
50.0000 mg | ORAL_TABLET | Freq: Once | ORAL | Status: AC
  Administered 2015-07-14: 50 mg via ORAL
  Filled 2015-07-14: qty 2

## 2015-07-14 MED ORDER — MORPHINE SULFATE (PF) 4 MG/ML IV SOLN: 4.0000 mg | Freq: Once | INTRAVENOUS | Status: DC

## 2015-07-14 MED ORDER — METOPROLOL TARTRATE 25 MG PO TABS
25.0000 mg | ORAL_TABLET | Freq: Once | ORAL | Status: AC
  Administered 2015-07-14: 25 mg via ORAL
  Filled 2015-07-14: qty 1

## 2015-07-14 MED ORDER — DIAZEPAM 5 MG PO TABS: 5.0000 mg | ORAL_TABLET | Freq: Three times a day (TID) | ORAL | Status: DC | PRN

## 2015-07-14 MED ORDER — DIAZEPAM 5 MG PO TABS
10.0000 mg | ORAL_TABLET | Freq: Once | ORAL | Status: AC
  Administered 2015-07-14: 10 mg via ORAL
  Filled 2015-07-14: qty 2

## 2015-07-14 MED ORDER — LISINOPRIL 20 MG PO TABS
10.0000 mg | ORAL_TABLET | Freq: Once | ORAL | Status: AC
  Administered 2015-07-14: 10 mg via ORAL
  Filled 2015-07-14: qty 1

## 2015-07-14 NOTE — ED Notes (Signed)
Pt presents with low back pain and chest pain started last night. Pt with hx of having to have cardiac caths.

## 2015-07-14 NOTE — Discharge Instructions (Signed)
Chest Pain (Nonspecific) °It is often hard to give a specific diagnosis for the cause of chest pain. There is always a chance that your pain could be related to something serious, such as a heart attack or a blood clot in the lungs. You need to follow up with your health care provider for further evaluation. °CAUSES  °· Heartburn. °· Pneumonia or bronchitis. °· Anxiety or stress. °· Inflammation around your heart (pericarditis) or lung (pleuritis or pleurisy). °· A blood clot in the lung. °· A collapsed lung (pneumothorax). It can develop suddenly on its own (spontaneous pneumothorax) or from trauma to the chest. °· Shingles infection (herpes zoster virus). °The chest wall is composed of bones, muscles, and cartilage. Any of these can be the source of the pain. °· The bones can be bruised by injury. °· The muscles or cartilage can be strained by coughing or overwork. °· The cartilage can be affected by inflammation and become sore (costochondritis). °DIAGNOSIS  °Lab tests or other studies may be needed to find the cause of your pain. Your health care provider may have you take a test called an ambulatory electrocardiogram (ECG). An ECG records your heartbeat patterns over a 24-hour period. You may also have other tests, such as: °· Transthoracic echocardiogram (TTE). During echocardiography, sound waves are used to evaluate how blood flows through your heart. °· Transesophageal echocardiogram (TEE). °· Cardiac monitoring. This allows your health care provider to monitor your heart rate and rhythm in real time. °· Holter monitor. This is a portable device that records your heartbeat and can help diagnose heart arrhythmias. It allows your health care provider to track your heart activity for several days, if needed. °· Stress tests by exercise or by giving medicine that makes the heart beat faster. °TREATMENT  °· Treatment depends on what may be causing your chest pain. Treatment may include: °· Acid blockers for  heartburn. °· Anti-inflammatory medicine. °· Pain medicine for inflammatory conditions. °· Antibiotics if an infection is present. °· You may be advised to change lifestyle habits. This includes stopping smoking and avoiding alcohol, caffeine, and chocolate. °· You may be advised to keep your head raised (elevated) when sleeping. This reduces the chance of acid going backward from your stomach into your esophagus. °Most of the time, nonspecific chest pain will improve within 2-3 days with rest and mild pain medicine.  °HOME CARE INSTRUCTIONS  °· If antibiotics were prescribed, take them as directed. Finish them even if you start to feel better. °· For the next few days, avoid physical activities that bring on chest pain. Continue physical activities as directed. °· Do not use any tobacco products, including cigarettes, chewing tobacco, or electronic cigarettes. °· Avoid drinking alcohol. °· Only take medicine as directed by your health care provider. °· Follow your health care provider's suggestions for further testing if your chest pain does not go away. °· Keep any follow-up appointments you made. If you do not go to an appointment, you could develop lasting (chronic) problems with pain. If there is any problem keeping an appointment, call to reschedule. °SEEK MEDICAL CARE IF:  °· Your chest pain does not go away, even after treatment. °· You have a rash with blisters on your chest. °· You have a fever. °SEEK IMMEDIATE MEDICAL CARE IF:  °· You have increased chest pain or pain that spreads to your arm, neck, jaw, back, or abdomen. °· You have shortness of breath. °· You have an increasing cough, or you cough   up blood.  You have severe back or abdominal pain.  You feel nauseous or vomit.  You have severe weakness.  You faint.  You have chills. This is an emergency. Do not wait to see if the pain will go away. Get medical help at once. Call your local emergency services (911 in [place] Do not drive  yourself to the hospital. MAKE SURE YOU:   Understand these instructions.  Will watch your condition.  Will get help right away if you are not doing well or get worse. Document Released: 08/14/2005 Document Revised: 11/09/2013 Document Reviewed: 06/09/2008 [employer] Patient Information 2015 [place], [place]. This information is not intended to replace advice given to you by your health care provider. Make sure you discuss any questions you have with your health care provider.  Hypertension Hypertension, commonly called high blood pressure, is when the force of blood pumping through your arteries is too strong. Your arteries are the blood vessels that carry blood from your heart throughout your body. A blood pressure reading consists of a higher number over a lower number, such as 110/72. The higher number (systolic) is the pressure inside your arteries when your heart pumps. The lower number (diastolic) is the pressure inside your arteries when your heart relaxes. Ideally you want your blood pressure below 120/80. Hypertension forces your heart to work harder to pump blood. Your arteries may become narrow or stiff. Having hypertension puts you at risk for heart disease, stroke, and other problems.  RISK FACTORS Some risk factors for high blood pressure are controllable. Others are not.  Risk factors you cannot control include:   Race. You may be at higher risk if you are African American.  Age. Risk increases with age.  Gender. Men are at higher risk than women before age 17 years. After age 24, women are at higher risk than men. Risk factors you can control include:  Not getting enough exercise or physical activity.  Being overweight.  Getting too much fat, sugar, calories, or salt in your diet.  Drinking too much alcohol. SIGNS AND SYMPTOMS Hypertension does not usually cause signs or symptoms. Extremely high blood pressure (hypertensive crisis) may cause headache, anxiety, shortness  of breath, and nosebleed. DIAGNOSIS  To check if you have hypertension, your health care provider will measure your blood pressure while you are seated, with your arm held at the level of your heart. It should be measured at least twice using the same arm. Certain conditions can cause a difference in blood pressure between your right and left arms. A blood pressure reading that is higher than normal on one occasion does not mean that you need treatment. If one blood pressure reading is high, ask your health care provider about having it checked again. TREATMENT  Treating high blood pressure includes making lifestyle changes and possibly taking medicine. Living a healthy lifestyle can help lower high blood pressure. You may need to change some of your habits. Lifestyle changes may include:  Following the DASH diet. This diet is high in fruits, vegetables, and whole grains. It is low in salt, red meat, and added sugars.  Getting at least 2 hours of brisk physical activity every week.  Losing weight if necessary.  Not smoking.  Limiting alcoholic beverages.  Learning ways to reduce stress. If lifestyle changes are not enough to get your blood pressure under control, your health care provider may prescribe medicine. You may need to take more than one. Work closely with your health care provider  to understand the risks and benefits. HOME CARE INSTRUCTIONS  Have your blood pressure rechecked as directed by your health care provider.   Take medicines only as directed by your health care provider. Follow the directions carefully. Blood pressure medicines must be taken as prescribed. The medicine does not work as well when you skip doses. Skipping doses also puts you at risk for problems.   Do not smoke.   Monitor your blood pressure at home as directed by your health care provider. SEEK MEDICAL CARE IF:   You think you are having a reaction to medicines taken.  You have recurrent  headaches or feel dizzy.  You have swelling in your ankles.  You have trouble with your vision. SEEK IMMEDIATE MEDICAL CARE IF:  You develop a severe headache or confusion.  You have unusual weakness, numbness, or feel faint.  You have severe chest or abdominal pain.  You vomit repeatedly.  You have trouble breathing. MAKE SURE YOU:   Understand these instructions.  Will watch your condition.  Will get help right away if you are not doing well or get worse. Document Released: 11/04/2005 Document Revised: 03/21/2014 Document Reviewed: 08/27/2013 [facility] Patient Information 2015 [place], [place]. This information is not intended to replace advice given to you by your health care provider. Make sure you discuss any questions you have with your health care provider.  Panic Attacks Panic attacks are sudden, short feelings of great fear or discomfort. You may have them for no reason when you are relaxed, when you are uneasy (anxious), or when you are sleeping.  HOME CARE  Take all your medicines as told.  Check with your doctor before starting new medicines.  Keep all doctor visits. GET HELP IF:  You are not able to take your medicines as told.  Your symptoms do not get better.  Your symptoms get worse. GET HELP RIGHT AWAY IF:  Your attacks seem different than your normal attacks.  You have thoughts about hurting yourself or others.  You take panic attack medicine and you have a side effect. MAKE SURE YOU:  Understand these instructions.  Will watch your condition.  Will get help right away if you are not doing well or get worse. Document Released: 12/07/2010 Document Revised: 08/25/2013 Document Reviewed: 06/18/2013 [facility] Patient Information 2015 [place], [place]. This information is not intended to replace advice given to you by your health care provider. Make sure you discuss any questions you have with your health care provider.

## 2015-07-14 NOTE — ED Provider Notes (Addendum)
[facility] Emergency Department Provider Note     Time seen: ----------------------------------------- 8:12 AM on 07/14/2015 -----------------------------------------    I have reviewed the triage vital signs and the nursing notes.   HISTORY  Chief Complaint Chest Pain    HPI Alan Gross is a 29 y.o. male who presents ER with left sided upper back pain and chest pain that started last night. He describes chest pain as burning. He has had a history of chest pain and heart catheters in the past. He was Recently in June which was unremarkable. Also notes his blood pressures been elevated, he sometimes forgets to take his blood pressure medication. Patient states he works 3 jobs and rarely sleeps.   Past Medical History  Diagnosis Date  . Hypertension   . Seizures     when he was a child  . H/O cardiac catheterization     Patient Active Problem List   Diagnosis Date Noted  . ACS (acute coronary syndrome) 05/02/2015    Past Surgical History  Procedure Laterality Date  . Coronary angioplasty    . Cardiac catheterization Left 05/02/2015    Procedure: Left Heart Cath and Coronary Angiography;  Surgeon: Dionisio David, MD;  Location: [place] [facility];  Service: Cardiovascular;  Laterality: Left;    Allergies Review of patient's allergies indicates no known allergies.  Social History Social History  Substance Use Topics  . Smoking status: Current Every Day Smoker -- 1.00 packs/day for 10 years    Types: Cigarettes  . Smokeless tobacco: Current User    Last Attempt to Quit: 05/02/2015  . Alcohol Use: 2.4 oz/week    4 Cans of beer per week    Review of Systems Constitutional: Negative for fever. Eyes: Negative for visual changes. ENT: Negative for sore throat. Cardiovascular: Positive for chest pain Respiratory: Negative for shortness of breath. Gastrointestinal: Negative for abdominal pain, vomiting and diarrhea. Genitourinary:  Negative for dysuria. Musculoskeletal: Positive for back pain Skin: Negative for rash. Neurological: Negative for headaches, focal weakness or numbness.  10-point ROS otherwise negative.  ____________________________________________   PHYSICAL EXAM:  VITAL SIGNS: ED Triage Vitals  Enc Vitals Group     BP --      Pulse --      Resp --      Temp --      Temp src --      SpO2 --      Weight --      Height --      Head Cir --      Peak Flow --      Pain Score 07/14/15 0806 10     Pain Loc --      Pain Edu? --      Excl. in [place]? --     Constitutional: Alert and oriented, anxious but well appearing and in no distress. Eyes: Conjunctivae are normal. PERRL. Normal extraocular movements. ENT   Head: Normocephalic and atraumatic.   Nose: No congestion/rhinnorhea.   Mouth/Throat: Mucous membranes are moist.   Neck: No stridor. Cardiovascular: Normal rate, regular rhythm. Normal and symmetric distal pulses are present in all extremities. No murmurs, rubs, or gallops. Respiratory: Normal respiratory effort without tachypnea nor retractions. Breath sounds are clear and equal bilaterally. No wheezes/rales/rhonchi. Gastrointestinal: Soft and nontender. No distention. No abdominal bruits.  Musculoskeletal: Nontender with normal range of motion in all extremities. No joint effusions.  No lower extremity tenderness nor edema. Neurologic:  Normal speech and language. No  gross focal neurologic deficits are appreciated. Speech is normal. No gait instability. Skin:  Skin is warm, dry and intact. No rash noted. Psychiatric: Mood and affect are normal. Speech and behavior are normal. Patient exhibits appropriate insight and judgment. ____________________________________________  EKG: Interpreted by me. Normal sinus rhythm with a rate of 75 bpm, left axis deviation, voltage criteria for LVH. Lateral T wave inversions. EKG changes are unchanged from  prior.  ____________________________________________  ED COURSE:  Pertinent labs & imaging results that were available during my care of the patient were reviewed by me and considered in my medical decision making (see chart for details). Patient will need blood pressure reduction, also appears anxious. Will start back on his blood pressure medications. He is not taking any blood pressure medicines today, will receive oral Valium and reevaluation. ____________________________________________    LABS (pertinent positives/negatives)  Labs Reviewed  CBC WITH DIFFERENTIAL/PLATELET - Abnormal; Notable for the following:    HCT 38.9 (*)    Platelets 134 (*)    Monocytes Absolute 1.1 (*)    All other components within normal limits  COMPREHENSIVE METABOLIC PANEL - Abnormal; Notable for the following:    Potassium 3.1 (*)    Glucose, Bld 100 (*)    Creatinine, Ser 1.30 (*)    All other components within normal limits  TROPONIN I  URINE DRUG SCREEN, QUALITATIVE ([facility] ONLY)    RADIOLOGY Images were viewed by me  Chest x-ray reveals cardiomegaly  ____________________________________________  FINAL ASSESSMENT AND PLAN  Chest pain and hypertension  Plan: Patient with labs and imaging as dictated above. Patient is 2 months out of his last heart catheterization which was unremarkable. He does have LVH from long-standing hypertension and medication noncompliance. He is started back on his blood pressure medicine as well as given Valium to help him relax her. He is very stressed out. No acute conditions identified this time. He stable for follow-up with his doctor.   Earleen Newport, MD  Patient feeling better, blood pressure markedly improved at 172/117. This is down from 237/129 Earleen Newport, MD 07/14/15 [id number]  Earleen Newport, MD 07/14/15 [phone]

## 2015-07-14 NOTE — ED Notes (Signed)
Patient transported to X-ray 

## 2015-07-14 NOTE — ED Notes (Signed)
Pt returned from X-ray.  

## 2015-08-10 ENCOUNTER — Emergency Department: Payer: 59

## 2015-08-10 ENCOUNTER — Emergency Department
Admission: EM | Admit: 2015-08-10 | Discharge: 2015-08-10 | Disposition: A | Discharge status: Home / Self Care | Payer: 59 | Attending: Emergency Medicine | Admitting: Emergency Medicine

## 2015-08-10 DIAGNOSIS — W228XXA Striking against or struck by other objects, initial encounter: Secondary | ICD-10-CM | POA: Insufficient documentation

## 2015-08-10 DIAGNOSIS — I10 Essential (primary) hypertension: Secondary | ICD-10-CM | POA: Insufficient documentation

## 2015-08-10 DIAGNOSIS — Y998 Other external cause status: Secondary | ICD-10-CM | POA: Insufficient documentation

## 2015-08-10 DIAGNOSIS — Z720 Tobacco use: Secondary | ICD-10-CM | POA: Insufficient documentation

## 2015-08-10 DIAGNOSIS — Y9389 Activity, other specified: Secondary | ICD-10-CM | POA: Insufficient documentation

## 2015-08-10 DIAGNOSIS — R0781 Pleurodynia: Secondary | ICD-10-CM

## 2015-08-10 DIAGNOSIS — Y9289 Other specified places as the place of occurrence of the external cause: Secondary | ICD-10-CM | POA: Insufficient documentation

## 2015-08-10 DIAGNOSIS — Z79899 Other long term (current) drug therapy: Secondary | ICD-10-CM | POA: Insufficient documentation

## 2015-08-10 DIAGNOSIS — S20212A Contusion of left front wall of thorax, initial encounter: Secondary | ICD-10-CM

## 2015-08-10 MED ORDER — CLONIDINE HCL 0.1 MG PO TABS
ORAL_TABLET | ORAL | Status: AC
Start: 2015-08-10 — End: 2015-08-10
  Administered 2015-08-10: 0.2 mg via ORAL
  Filled 2015-08-10: qty 2

## 2015-08-10 MED ORDER — OXYCODONE-ACETAMINOPHEN 5-325 MG PO TABS: 1.0000 | ORAL_TABLET | ORAL | Status: DC | PRN

## 2015-08-10 MED ORDER — OXYCODONE-ACETAMINOPHEN 5-325 MG PO TABS
1.0000 | ORAL_TABLET | Freq: Once | ORAL | Status: AC
  Administered 2015-08-10: 1 via ORAL
  Filled 2015-08-10: qty 1

## 2015-08-10 MED ORDER — CLONIDINE HCL 0.1 MG PO TABS
0.2000 mg | ORAL_TABLET | Freq: Once | ORAL | Status: AC
  Administered 2015-08-10: 0.2 mg via ORAL

## 2015-08-10 NOTE — Discharge Instructions (Signed)
Rib Contusion °A rib contusion (bruise) can occur by a blow to the chest or by a fall against a hard object. Usually these will be much better in a couple weeks. If X-rays were taken today and there are no broken bones (fractures), the diagnosis of bruising is made. However, broken ribs may not show up for several days, or may be discovered later on a routine X-ray when signs of healing show up. If this happens to you, it does not mean that something was missed on the X-ray, but simply that it did not show up on the first X-rays. Earlier diagnosis will not usually change the treatment. °HOME CARE INSTRUCTIONS  °· Avoid strenuous activity. Be careful during activities and avoid bumping the injured ribs. Activities that pull on the injured ribs and cause pain should be avoided, if possible. °· For the first day or two, an ice pack used every 20 minutes while awake may be helpful. Put ice in a plastic bag and put a towel between the bag and the skin. °· Eat a normal, well-balanced diet. Drink plenty of fluids to avoid constipation. °· Take deep breaths several times a day to keep lungs free of infection. Try to cough several times a day. Splint the injured area with a pillow while coughing to ease pain. Coughing can help prevent pneumonia. °· Wear a rib belt or binder only if told to do so by your caregiver. If you are wearing a rib belt or binder, you must do the breathing exercises as directed by your caregiver. If not used properly, rib belts or binders restrict breathing which can lead to pneumonia. °· Only take over-the-counter or prescription medicines for pain, discomfort, or fever as directed by your caregiver. °SEEK MEDICAL CARE IF:  °· You or your child has an oral temperature above 102° F (38.9° C). °· Your baby is older than 3 months with a rectal temperature of 100.5° F (38.1° C) or higher for more than 1 day. °· You develop a cough, with thick or bloody sputum. °SEEK IMMEDIATE MEDICAL CARE IF:  °· You  have difficulty breathing. °· You feel sick to your stomach (nausea), have vomiting or belly (abdominal) pain. °· You have worsening pain, not controlled with medications, or there is a change in the location of the pain. °· You develop sweating or radiation of the pain into the arms, jaw or shoulders, or become light headed or faint. °· You or your child has an oral temperature above 102° F (38.9° C), not controlled by medicine. °· Your or your baby is older than 3 months with a rectal temperature of 102° F (38.9° C) or higher. °· Your baby is 3 months old or younger with a rectal temperature of 100.4° F (38° C) or higher. °MAKE SURE YOU:  °· Understand these instructions. °· Will watch your condition. °· Will get help right away if you are not doing well or get worse. °Document Released: 07/30/2001 Document Revised: 03/01/2013 Document Reviewed: 06/22/2008 °ExitCare® Patient Information ©2015 [employer]. This information is not intended to replace advice given to you by your health care provider. Make sure you discuss any questions you have with your health care provider. ° °

## 2015-08-10 NOTE — ED Notes (Addendum)
Patient ambulatory to triage with steady gait, without difficulty or distress noted; pt ran into corner of  cardoor hour PTA; pt c/o left lower ribcage pain since and concerned he has a cracked rib; pt off BP meds for last 2weeks

## 2015-08-10 NOTE — ED Provider Notes (Signed)
[facility] Emergency Department Provider Note  ____________________________________________  Time seen: 1:45 AM  I have reviewed the triage vital signs and the nursing notes.   HISTORY  Chief Complaint Rib Injury      HPI Alan Gross is a 29 y.o. male presents with history of being struck on the left side of his chest by a car door at approximately midnight. Patient now admits to left rib cage pain. Patient was noted to be markedly hypertensive on presentation to triage. Patient states that he has been noncompliant with his medications for the past 2 weeks however that he does indeed have his medications. Patient denies any shortness of breath no dizziness no headache.    Past Medical History  Diagnosis Date  . Hypertension   . Seizures     when he was a child  . H/O cardiac catheterization     Patient Active Problem List   Diagnosis Date Noted  . ACS (acute coronary syndrome) 05/02/2015    Past Surgical History  Procedure Laterality Date  . Coronary angioplasty    . Cardiac catheterization Left 05/02/2015    Procedure: Left Heart Cath and Coronary Angiography;  Surgeon: Dionisio David, MD;  Location: [place] [facility];  Service: Cardiovascular;  Laterality: Left;    Current Outpatient Rx  Name  Route  Sig  Dispense  Refill  . diazepam (VALIUM) 5 MG tablet   Oral   Take 1 tablet (5 mg total) by mouth every 8 (eight) hours as needed for anxiety.   20 tablet   0   . hydrALAZINE (APRESOLINE) 50 MG tablet   Oral   Take 1 tablet (50 mg total) by mouth 3 (three) times daily.   90 tablet   0   . lisinopril (PRINIVIL,ZESTRIL) 10 MG tablet   Oral   Take 1 tablet (10 mg total) by mouth daily.   30 tablet   0   . metoprolol tartrate (LOPRESSOR) 25 MG tablet   Oral   Take 1 tablet (25 mg total) by mouth 2 (two) times daily.   60 tablet   0   . oxyCODONE-acetaminophen (PERCOCET/ROXICET) 5-325 MG per tablet   Oral   Take 1 tablet  by mouth every 4 (four) hours as needed for severe pain.   12 tablet   0     Allergies No known drug allergies  No family history on file.  Social History Social History  Substance Use Topics  . Smoking status: Current Every Day Smoker -- 1.00 packs/day for 10 years    Types: Cigarettes  . Smokeless tobacco: Current User    Last Attempt to Quit: 05/02/2015  . Alcohol Use: 2.4 oz/week    4 Cans of beer per week    Review of Systems  Constitutional: Negative for fever. Eyes: Negative for visual changes. ENT: Negative for sore throat. Cardiovascular: Positive for left chest wall pain Respiratory: Negative for shortness of breath. Gastrointestinal: Negative for abdominal pain, vomiting and diarrhea. Genitourinary: Negative for dysuria. Musculoskeletal: Negative for back pain. Skin: Negative for rash. Neurological: Negative for headaches, focal weakness or numbness.   10-point ROS otherwise negative.  ____________________________________________   PHYSICAL EXAM:  VITAL SIGNS: ED Triage Vitals  Enc Vitals Group     BP 08/10/15 0138 217/154 mmHg     Pulse Rate 08/10/15 0138 85     Resp 08/10/15 0138 18     Temp --      Temp src --  SpO2 08/10/15 0138 99 %     Weight --      Height --      Head Cir --      Peak Flow --      Pain Score 08/10/15 0054 9     Pain Loc --      Pain Edu? --      Excl. in [place]? --      Constitutional: Alert and oriented. Well appearing and in no distress. Eyes: Conjunctivae are normal. PERRL. Normal extraocular movements. ENT   Head: Normocephalic and atraumatic.   Nose: No congestion/rhinnorhea.   Mouth/Throat: Mucous membranes are moist.   Neck: No stridor. Cardiovascular: Normal rate, regular rhythm. Normal and symmetric distal pulses are present in all extremities. No murmurs, rubs, or gallops. Point tenderness with palpation of the 9th rib. Respiratory: Normal respiratory effort without tachypnea nor  retractions. Breath sounds are clear and equal bilaterally. No wheezes/rales/rhonchi. Gastrointestinal: Soft and nontender. No distention. There is no CVA tenderness. Genitourinary: deferred Musculoskeletal: Nontender with normal range of motion in all extremities. No joint effusions.  No lower extremity tenderness nor edema. Neurologic:  Normal speech and language. No gross focal neurologic deficits are appreciated. Speech is normal.  Skin:  Skin is warm, dry and intact. No rash noted. Psychiatric: Mood and affect are normal. Speech and behavior are normal. Patient exhibits appropriate insight and judgment.  ____________________________________________    LABS (pertinent positives/negatives)  Labs Reviewed - No data to display   ____________________________________________   EKG ED ECG REPORT I, BROWN, [place], the attending physician, personally viewed and interpreted this ECG.   Date: 08/10/2015  EKG Time: 1:00 AM  Rate: 83  Rhythm: Normal sinus rhythm  Axis: None  Intervals: Normal  ST&T Change: None   ____________________________________________    RADIOLOGY DG Chest Portable 1 View (Final result) Result time: 08/10/15 01:36:18   Final result by Rad Results In Interface (08/10/15 01:36:18)   Narrative:   CLINICAL DATA: Patient ran into corner of the car door 11 p.m. Left-sided pain with difficulty breathing.  EXAM: PORTABLE CHEST - 1 VIEW  COMPARISON: 07/14/2015  FINDINGS: The heart size and mediastinal contours are within normal limits. Both lungs are clear. The visualized skeletal structures are unremarkable.  IMPRESSION: No active disease.   Electronically Signed By: Lucienne Capers M.D. On: 08/10/2015 01:36             INITIAL IMPRESSION / ASSESSMENT AND PLAN / ED COURSE  Pertinent labs & imaging results that were available during my care of the patient were reviewed by me and considered in my medical decision making (see  chart for details).  History and physical exam consistent with possible rib contusion chest x-ray negative for any acute fracture.  ____________________________________________   FINAL CLINICAL IMPRESSION(S) / ED DIAGNOSES  Final diagnoses:  Rib pain on left side  Rib contusion, left, initial encounter      Gregor Hams, MD 08/10/15 0230

## 2016-04-16 ENCOUNTER — Inpatient Hospital Stay
Admission: EM | Admit: 2016-04-16 | Discharge: 2016-04-18 | DRG: 304 | Disposition: A | Discharge status: Home / Self Care | Payer: Self-pay | Attending: Internal Medicine | Admitting: Internal Medicine

## 2016-04-16 ENCOUNTER — Encounter: Payer: Self-pay | Admitting: Emergency Medicine

## 2016-04-16 ENCOUNTER — Emergency Department: Payer: Self-pay

## 2016-04-16 DIAGNOSIS — Z7982 Long term (current) use of aspirin: Secondary | ICD-10-CM

## 2016-04-16 DIAGNOSIS — D696 Thrombocytopenia, unspecified: Secondary | ICD-10-CM | POA: Diagnosis present

## 2016-04-16 DIAGNOSIS — R778 Other specified abnormalities of plasma proteins: Secondary | ICD-10-CM

## 2016-04-16 DIAGNOSIS — E876 Hypokalemia: Secondary | ICD-10-CM | POA: Diagnosis present

## 2016-04-16 DIAGNOSIS — R7989 Other specified abnormal findings of blood chemistry: Secondary | ICD-10-CM

## 2016-04-16 DIAGNOSIS — N179 Acute kidney failure, unspecified: Secondary | ICD-10-CM | POA: Diagnosis present

## 2016-04-16 DIAGNOSIS — H53122 Transient visual loss, left eye: Secondary | ICD-10-CM | POA: Diagnosis present

## 2016-04-16 DIAGNOSIS — F17210 Nicotine dependence, cigarettes, uncomplicated: Secondary | ICD-10-CM | POA: Diagnosis present

## 2016-04-16 DIAGNOSIS — N170 Acute kidney failure with tubular necrosis: Secondary | ICD-10-CM | POA: Diagnosis present

## 2016-04-16 DIAGNOSIS — N189 Chronic kidney disease, unspecified: Secondary | ICD-10-CM | POA: Diagnosis present

## 2016-04-16 DIAGNOSIS — I248 Other forms of acute ischemic heart disease: Secondary | ICD-10-CM | POA: Diagnosis present

## 2016-04-16 DIAGNOSIS — Z8673 Personal history of transient ischemic attack (TIA), and cerebral infarction without residual deficits: Secondary | ICD-10-CM

## 2016-04-16 DIAGNOSIS — I160 Hypertensive urgency: Secondary | ICD-10-CM | POA: Diagnosis present

## 2016-04-16 DIAGNOSIS — R569 Unspecified convulsions: Secondary | ICD-10-CM | POA: Diagnosis present

## 2016-04-16 DIAGNOSIS — Z79899 Other long term (current) drug therapy: Secondary | ICD-10-CM

## 2016-04-16 DIAGNOSIS — I161 Hypertensive emergency: Principal | ICD-10-CM | POA: Diagnosis present

## 2016-04-16 DIAGNOSIS — F1290 Cannabis use, unspecified, uncomplicated: Secondary | ICD-10-CM | POA: Diagnosis present

## 2016-04-16 DIAGNOSIS — I10 Essential (primary) hypertension: Secondary | ICD-10-CM | POA: Diagnosis present

## 2016-04-16 DIAGNOSIS — H539 Unspecified visual disturbance: Secondary | ICD-10-CM

## 2016-04-16 DIAGNOSIS — E871 Hypo-osmolality and hyponatremia: Secondary | ICD-10-CM | POA: Diagnosis present

## 2016-04-16 DIAGNOSIS — I129 Hypertensive chronic kidney disease with stage 1 through stage 4 chronic kidney disease, or unspecified chronic kidney disease: Secondary | ICD-10-CM | POA: Diagnosis present

## 2016-04-16 LAB — BASIC METABOLIC PANEL
ANION GAP: 13 (ref 5–15)
ANION GAP: 11 (ref 5–15)
BUN: 28 mg/dL — AB (ref 6–20)
BUN: 29 mg/dL — ABNORMAL HIGH (ref 6–20)
CHLORIDE: 90 mmol/L — AB (ref 101–111)
CHLORIDE: 91 mmol/L — AB (ref 101–111)
CO2: 30 mmol/L (ref 22–32)
CO2: 32 mmol/L (ref 22–32)
CREATININE: 4.46 mg/dL — AB (ref 0.61–1.24)
Calcium: 9.2 mg/dL (ref 8.9–10.3)
Calcium: 9 mg/dL (ref 8.9–10.3)
Creatinine, Ser: 5.04 mg/dL — ABNORMAL HIGH (ref 0.61–1.24)
GFR calc non Af Amer: 16 mL/min — ABNORMAL LOW (ref >60)
GFR, EST AFRICAN AMERICAN: 16 mL/min — AB (ref >60)
GFR, EST AFRICAN AMERICAN: 19 mL/min — AB (ref >60)
GFR, EST NON AFRICAN AMERICAN: 14 mL/min — AB (ref >60)
Glucose, Bld: 104 mg/dL — ABNORMAL HIGH (ref 65–99)
Glucose, Bld: 140 mg/dL — ABNORMAL HIGH (ref 65–99)
POTASSIUM: 2.2 mmol/L — AB (ref 3.5–5.1)
Potassium: 2.4 mmol/L — CL (ref 3.5–5.1)
SODIUM: 133 mmol/L — AB (ref 135–145)
SODIUM: 134 mmol/L — AB (ref 135–145)

## 2016-04-16 LAB — CBC
HCT: 40.7 % (ref 40.0–52.0)
HEMATOCRIT: 42.5 % (ref 40.0–52.0)
HEMOGLOBIN: 14.2 g/dL (ref 13.0–18.0)
Hemoglobin: 13.5 g/dL (ref 13.0–18.0)
MCH: 27.6 pg (ref 26.0–34.0)
MCH: 27.4 pg (ref 26.0–34.0)
MCHC: 33.3 g/dL (ref 32.0–36.0)
MCHC: 33.1 g/dL (ref 32.0–36.0)
MCV: 82.9 fL (ref 80.0–100.0)
MCV: 83 fL (ref 80.0–100.0)
PLATELETS: 129 10*3/uL — AB (ref 150–440)
Platelets: 142 10*3/uL — ABNORMAL LOW (ref 150–440)
RBC: 5.13 MIL/uL (ref 4.40–5.90)
RBC: 4.91 MIL/uL (ref 4.40–5.90)
RDW: 14.5 % (ref 11.5–14.5)
RDW: 14.4 % (ref 11.5–14.5)
WBC: 14.6 10*3/uL — AB (ref 3.8–10.6)
WBC: 13.2 10*3/uL — ABNORMAL HIGH (ref 3.8–10.6)

## 2016-04-16 LAB — TROPONIN I: Troponin I: 0.26 ng/mL — ABNORMAL HIGH (ref <0.031)

## 2016-04-16 LAB — GLUCOSE, CAPILLARY: Glucose-Capillary: 150 mg/dL — ABNORMAL HIGH (ref 65–99)

## 2016-04-16 MED ORDER — SODIUM CHLORIDE 0.9 % IV SOLN
INTRAVENOUS | Status: DC
  Administered 2016-04-16: via INTRAVENOUS

## 2016-04-16 MED ORDER — POTASSIUM CHLORIDE 10 MEQ/100ML IV SOLN
10.0000 meq | INTRAVENOUS | Status: DC
  Filled 2016-04-16 (×3): qty 100

## 2016-04-16 MED ORDER — ASPIRIN 81 MG PO CHEW
324.0000 mg | CHEWABLE_TABLET | Freq: Once | ORAL | Status: AC
  Administered 2016-04-16: 324 mg via ORAL
  Filled 2016-04-16 (×2): qty 4

## 2016-04-16 MED ORDER — ASPIRIN 325 MG PO TABS
325.0000 mg | ORAL_TABLET | Freq: Every day | ORAL | Status: DC
  Administered 2016-04-17 – 2016-04-18 (×2): 325 mg via ORAL
  Filled 2016-04-16 (×2): qty 1

## 2016-04-16 MED ORDER — LABETALOL HCL 5 MG/ML IV SOLN
20.0000 mg | Freq: Once | INTRAVENOUS | Status: AC
  Administered 2016-04-16: 20 mg via INTRAVENOUS

## 2016-04-16 MED ORDER — LABETALOL HCL 5 MG/ML IV SOLN
80.0000 mg | Freq: Once | INTRAVENOUS | Status: AC
  Administered 2016-04-16: 80 mg via INTRAVENOUS

## 2016-04-16 MED ORDER — SODIUM CHLORIDE 0.9% FLUSH
3.0000 mL | Freq: Two times a day (BID) | INTRAVENOUS | Status: DC
  Administered 2016-04-17 – 2016-04-18 (×3): 3 mL via INTRAVENOUS

## 2016-04-16 MED ORDER — POTASSIUM CHLORIDE CRYS ER 20 MEQ PO TBCR
40.0000 meq | EXTENDED_RELEASE_TABLET | Freq: Once | ORAL | Status: AC
  Administered 2016-04-16: 40 meq via ORAL

## 2016-04-16 MED ORDER — NITROGLYCERIN IN D5W 200-5 MCG/ML-% IV SOLN
0.0000 ug/min | INTRAVENOUS | Status: DC
  Administered 2016-04-16: 5 ug/min via INTRAVENOUS
  Administered 2016-04-17: 8 ug/min via INTRAVENOUS

## 2016-04-16 MED ORDER — HEPARIN SODIUM (PORCINE) 5000 UNIT/ML IJ SOLN
5000.0000 [IU] | Freq: Three times a day (TID) | INTRAMUSCULAR | Status: DC
  Administered 2016-04-16 – 2016-04-18 (×5): 5000 [IU] via SUBCUTANEOUS
  Filled 2016-04-16 (×3): qty 1

## 2016-04-16 MED ORDER — ONDANSETRON HCL 4 MG PO TABS: 4.0000 mg | ORAL_TABLET | Freq: Four times a day (QID) | ORAL | Status: DC | PRN

## 2016-04-16 MED ORDER — POTASSIUM CHLORIDE CRYS ER 20 MEQ PO TBCR: 40.0000 meq | EXTENDED_RELEASE_TABLET | Freq: Once | ORAL | Status: DC

## 2016-04-16 MED ORDER — LABETALOL HCL 5 MG/ML IV SOLN
40.0000 mg | Freq: Once | INTRAVENOUS | Status: AC
  Administered 2016-04-16: 40 mg via INTRAVENOUS

## 2016-04-16 MED ORDER — ACETAMINOPHEN 325 MG PO TABS
650.0000 mg | ORAL_TABLET | Freq: Four times a day (QID) | ORAL | Status: DC | PRN
  Administered 2016-04-16 – 2016-04-17 (×3): 650 mg via ORAL
  Filled 2016-04-16 (×3): qty 2

## 2016-04-16 MED ORDER — CARVEDILOL 25 MG PO TABS
25.0000 mg | ORAL_TABLET | Freq: Two times a day (BID) | ORAL | Status: DC
  Administered 2016-04-16 – 2016-04-18 (×5): 25 mg via ORAL
  Filled 2016-04-16 (×4): qty 1

## 2016-04-16 MED ORDER — HYDRALAZINE HCL 50 MG PO TABS
50.0000 mg | ORAL_TABLET | Freq: Three times a day (TID) | ORAL | Status: DC
  Administered 2016-04-16 – 2016-04-18 (×6): 50 mg via ORAL
  Filled 2016-04-16 (×6): qty 1

## 2016-04-16 MED ORDER — SENNOSIDES-DOCUSATE SODIUM 8.6-50 MG PO TABS: 1.0000 | ORAL_TABLET | Freq: Every evening | ORAL | Status: DC | PRN

## 2016-04-16 MED ORDER — LABETALOL HCL 5 MG/ML IV SOLN: 80.0000 mg | Freq: Once | INTRAVENOUS | Status: DC

## 2016-04-16 MED ORDER — AMLODIPINE BESYLATE 10 MG PO TABS
10.0000 mg | ORAL_TABLET | Freq: Every day | ORAL | Status: DC
  Administered 2016-04-16 – 2016-04-18 (×3): 10 mg via ORAL
  Filled 2016-04-16 (×2): qty 1

## 2016-04-16 MED ORDER — ONDANSETRON HCL 4 MG/2ML IJ SOLN: 4.0000 mg | Freq: Four times a day (QID) | INTRAMUSCULAR | Status: DC | PRN

## 2016-04-16 MED ORDER — ACETAMINOPHEN 650 MG RE SUPP
650.0000 mg | Freq: Four times a day (QID) | RECTAL | Status: DC | PRN
Start: 2016-04-16 — End: 2016-04-18

## 2016-04-16 MED ORDER — DILTIAZEM HCL 100 MG IV SOLR
5.0000 mg/h | Freq: Once | INTRAVENOUS | Status: AC
  Administered 2016-04-16: 5 mg/h via INTRAVENOUS
  Filled 2016-04-16: qty 100

## 2016-04-16 MED ORDER — ONDANSETRON HCL 4 MG/2ML IJ SOLN
4.0000 mg | Freq: Once | INTRAMUSCULAR | Status: AC
  Administered 2016-04-16: 4 mg via INTRAVENOUS

## 2016-04-16 NOTE — Progress Notes (Signed)
eLink Physician-Brief Progress Note Patient Name: Alan Gross DOB: [birth year] MRN: [id number]   Date of Service  04/16/2016  HPI/Events of Note  30 yo with HTN, out of meds for last week, presents with  Left sided c/p, blurred vision  eICU Interventions  HTN urgency - restart home meds - start nitro gtt - rest per primary team.      Intervention Category Evaluation Type: New Patient Evaluation  [name] 04/16/2016, 10:54 PM

## 2016-04-16 NOTE — ED Notes (Signed)
Pt here with c/o left sided cp, does not radiate, also c/o left eye blurriness for 3 days now. Has recently moved and has been out of his blood pressure meds for almost 2 weeks, had been trying to get his meds refilled but was meeting resistance with pharmacy. Denies cocaine use, states his stress level has been high at work.

## 2016-04-16 NOTE — ED Notes (Signed)
Diltiazem d/c per MD Crosley, see MAR

## 2016-04-16 NOTE — ED Notes (Signed)
Per MD Crosley , redraw BMP for K levels

## 2016-04-16 NOTE — ED Notes (Signed)
D/c IV K per MD Crosley, verbal orders for PO K

## 2016-04-16 NOTE — H&P (Signed)
PCP:   No PCP Per Patient   Chief Complaint:  Chest pains, nausea vomiting  HPI: This is a 30 year old gentleman with a history of hypertension poorly controlled. Patient states at baseline his systolic blood pressures often 200. He recently moved and was unable to get his medications for the last week and a half. For the past 2 weeks he has had left-sided chest pains with no radiation, on and off. He describes the pain as sharp, at its worse its 8/10. For the past 3 days he has not been able to see from the left eye. He has had headaches for greater than a week. Today he developed nausea and vomiting and increasing chest pain. He states his urine output is normal. He finally came to the ER. On presentations of the patient's blood pressure was 255/177. His creatinine iwas 5.0. He's had 3 doses of labetalol 20, 40, 40 mg IV and his blood pressures currently is 197/170. The hospitalist have been called to admit. The patient states he does see better from the left eye now. His chest pain is resolved. He has had a total of 3 LHC in the past. The patient states he drinks almost every other weekend and this weekend he used mushrooms which is a hallucinogen.  Review of Systems:  The patient denies anorexia, fever, weight loss,, vision loss, decreased hearing, hoarseness, chest pain, syncope, dyspnea on exertion, peripheral edema, balance deficits, hemoptysis, abdominal pain, melena, hematochezia, severe indigestion/heartburn, hematuria, incontinence, genital sores, muscle weakness, suspicious skin lesions, transient blindness, difficulty walking, depression, unusual weight change, abnormal bleeding, enlarged lymph nodes, angioedema, and breast masses.  Past Medical History: Past Medical History  Diagnosis Date  . Hypertension   . Seizures ([place])     when he was a child  . H/O cardiac catheterization    Past Surgical History  Procedure Laterality Date  . Coronary angioplasty    . Cardiac  catheterization Left 05/02/2015    Procedure: Left Heart Cath and Coronary Angiography;  Surgeon: Dionisio David, MD;  Location: [place] [facility];  Service: Cardiovascular;  Laterality: Left;    Medications: Prior to Admission medications   Medication Sig Start Date End Date Taking? Authorizing Provider  amLODipine (NORVASC) 10 MG tablet Take 10 mg by mouth daily.   Yes Historical Provider, MD  aspirin 325 MG tablet Take 325 mg by mouth daily.   Yes Historical Provider, MD  carvedilol (COREG) 25 MG tablet Take 25 mg by mouth 2 (two) times daily with a meal.   Yes Historical Provider, MD  hydrALAZINE (APRESOLINE) 50 MG tablet Take 50 mg by mouth 2 (two) times daily.   Yes Historical Provider, MD  spironolactone (ALDACTONE) 25 MG tablet Take 25 mg by mouth daily.   Yes Historical Provider, MD    Allergies:  No Known Allergies  Social History:  reports that he has been smoking Cigarettes.  He has a 10 pack-year smoking history. He uses smokeless tobacco. He reports that he drinks about 2.4 oz of alcohol per week. He reports that he uses illicit drugs (Marijuana) and mushrooms  Family History: Diabetes meelitus  Physical Exam: Filed Vitals:   04/16/16 2010 04/16/16 2030 04/16/16 2100 04/16/16 2110  BP: 197/170 211/150 206/136 209/140  Pulse: 74 77 80 80  Temp:      TempSrc:      Resp: 22 19 23 21   Height:      Weight:      SpO2: 98% 96% 95% 99%  General:  Alert and oriented times three, well developed and nourished, no acute distress Eyes: PERRLA, pink conjunctiva, no scleral icterus ENT: Moist oral mucosa, neck supple, no thyromegaly Lungs: clear to ascultation, no wheeze, no crackles, no use of accessory muscles Cardiovascular: regular rate and rhythm, no regurgitation, no gallops, no murmurs. No carotid bruits, no JVD Abdomen: soft, positive BS, non-tender, non-distended, no organomegaly, not an acute abdomen GU: not examined Neuro: CN II - XII grossly intact, sensation  intact Musculoskeletal: strength 5/5 all extremities, no clubbing, cyanosis or edema Skin: no rash, no subcutaneous crepitation, no decubitus Psych: appropriate patient   Labs on Admission:   Recent Labs  04/16/16 1825  NA 133*  K 2.2*  CL 90*  CO2 30  GLUCOSE 104*  BUN 28*  CREATININE 5.04*  CALCIUM 9.2   No results for input(s): AST, ALT, ALKPHOS, BILITOT, PROT, ALBUMIN in the last 72 hours. No results for input(s): LIPASE, AMYLASE in the last 72 hours.  Recent Labs  04/16/16 1825  WBC 14.6*  HGB 14.2  HCT 42.5  MCV 82.9  PLT 142*    Recent Labs  04/16/16 1825  TROPONINI 0.26*   Invalid input(s): POCBNP No results for input(s): DDIMER in the last 72 hours. No results for input(s): HGBA1C in the last 72 hours. No results for input(s): CHOL, HDL, LDLCALC, TRIG, CHOLHDL, LDLDIRECT in the last 72 hours. No results for input(s): TSH, T4TOTAL, T3FREE, THYROIDAB in the last 72 hours.  Invalid input(s): FREET3 No results for input(s): VITAMINB12, FOLATE, FERRITIN, TIBC, IRON, RETICCTPCT in the last 72 hours.  Micro Results: No results found for this or any previous visit (from the past 240 hour(s)).   Radiological Exams on Admission: Ct Head Wo Contrast  04/16/2016  CLINICAL DATA:  Blurred vision for 3 days.  Hypertension. EXAM: CT HEAD WITHOUT CONTRAST TECHNIQUE: Contiguous axial images were obtained from the base of the skull through the vertex without intravenous contrast. COMPARISON:  10/14/2013 FINDINGS: No evidence of intracranial hemorrhage, brain edema, or other signs of acute infarction. No evidence of intracranial mass lesion or mass effect. No abnormal extraaxial fluid collections identified. Ventricles are normal in size. No skull abnormality identified. IMPRESSION: Negative noncontrast head CT. Electronically Signed   By: Earle Gell M.D.   On: 04/16/2016 20:07   Dg Chest Port 1 View  04/16/2016  CLINICAL DATA:  Chest pain for 4 days. Shortness of  breath, nausea and vomiting, dizziness, fatigue, loss of appetite. EXAM: PORTABLE CHEST 1 VIEW COMPARISON:  Chest x-rays dated 08/10/2015, 02/14/2015 and 10/14/2013. FINDINGS: The cardiomegaly is stable or slightly increased compared to the previous exams. Pulmonary vasculature is stable. Lungs appear clear. No evidence of pneumonia. No pleural effusion or pneumothorax seen. Osseous and soft tissue structures about the chest are unremarkable. IMPRESSION: No evidence of acute cardiopulmonary abnormality. Cardiomegaly is stable or slightly increased. Electronically Signed   By: Franki Cabot M.D.   On: 04/16/2016 19:36    Assessment/Plan Present on Admission:  . HTN (hypertension), malignant -Admit to stepdown -Nitroglycerin drip ordered titrate to systolic blood pressure possible to 160. will not attempt to normalized blood pressure tonight as patient obviously has chronically elevated blood pressure. -Resume home medications tonight -Will order urine drug screen  . Hypokalemia -Will replete by mouth cautiously given patient's elevated creatinine -Follow with the BMPs. -Will check a magnesium level  . Acute renal failure (ARF) (HCC) -Unclear cause for patient's renal failure -IV fluid hydration, strict I's and O's -No nephrotoxic  medications -Repeat BMP in a.m.,  IV fluid hydration -Nephrologist Dr.Kofluru is aware and should see patient in a.m.  Chest pain Elevated troponin -Likely due to cardiac strain. We'll cycle cardiac enzymes.  -IV nitroglycerin for blood pressure control.  -Monitor in telemetry -Will order 2-D echo to evaluate heart function -Patient has had 3 LHC in the past. Deferred to AM team cardiology consult needed  Transient left eyes blindness -Already improved with improvement of blood pressure. CT head negative  Tobacco use  -Nicotine patch, duonebs  Illicit drug use -Patient counseled on the importance of no tobacco use or illicit drug use especially given his  health history -Will order a urine drug screen  Leukocytosis -Likely due to stress emargination, monitor    Jeilani Grupe 04/16/2016, 9:21 PM

## 2016-04-16 NOTE — ED Notes (Signed)
Pt is a flight risk, states his meds just got called in to [place], and he has to work in am. I convinced pt to stay long enough for CT and to see ED MD, pt placed in subwait and states he will comply with plan.

## 2016-04-16 NOTE — ED Provider Notes (Signed)
[facility] Emergency Department Provider Note   ____________________________________________  Time seen: Approximately 6"45pm I have reviewed the triage vital signs and the triage nursing note.  HISTORY  Chief Complaint Chest Pain and Blurred Vision   Historian Patient  HPI Alan Gross is a 30 y.o. male with a history of hypertension, who is been out of his medications for about a week and a half now, is here with complaint of headache, nausea, and blurry vision in his right eye.   He has had some left-sided chest discomfort. No palpitations. No trouble breathing. No vomiting or diarrhea. No altered mental status. Symptoms are moderate.  Reports he was not evaluated in the past when he had an episode of left facial droop and left arm and leg loss of sensation that took several weeks to resolve.  He states he ran out of his medications and the pharmacy did not have a prescription available to refill.    Past Medical History  Diagnosis Date  . Hypertension   . Seizures ([place])     when he was a child  . H/O cardiac catheterization     Patient Active Problem List   Diagnosis Date Noted  . ACS (acute coronary syndrome) ([place]) 05/02/2015    Past Surgical History  Procedure Laterality Date  . Coronary angioplasty    . Cardiac catheterization Left 05/02/2015    Procedure: Left Heart Cath and Coronary Angiography;  Surgeon: Dionisio David, MD;  Location: [facility];  Service: Cardiovascular;  Laterality: Left;    Current Outpatient Rx  Name  Route  Sig  Dispense  Refill  . amLODipine (NORVASC) 10 MG tablet   Oral   Take 10 mg by mouth daily.         Marland Kitchen aspirin 325 MG tablet   Oral   Take 325 mg by mouth daily.         . carvedilol (COREG) 25 MG tablet   Oral   Take 25 mg by mouth 2 (two) times daily with a meal.         . hydrALAZINE (APRESOLINE) 50 MG tablet   Oral   Take 50 mg by mouth 2 (two) times daily.         Marland Kitchen  spironolactone (ALDACTONE) 25 MG tablet   Oral   Take 25 mg by mouth daily.           Allergies Review of patient's allergies indicates no known allergies.  No family history on file.  Social History Social History  Substance Use Topics  . Smoking status: Current Every Day Smoker -- 1.00 packs/day for 10 years    Types: Cigarettes  . Smokeless tobacco: Current User    Last Attempt to Quit: 05/02/2015  . Alcohol Use: 2.4 oz/week    4 Cans of beer per week     Comment: occas.     Review of Systems  Constitutional: Negative for fever. Eyes: Positive for visual changes. ENT: Negative for sore throat. Cardiovascular: Positive for chest pain.  Respiratory: Negative for shortness of breath.   Gastrointestinal: Negative for abdominal pain, vomiting and diarrhea. Genitourinary: Negative for dysuria. Musculoskeletal: Negative for back pain. Skin: Negative for rash. Neurological: Positive for mild to moderate global headache. 10 point Review of Systems otherwise negative ____________________________________________   PHYSICAL EXAM:  VITAL SIGNS: ED Triage Vitals  Enc Vitals Group     BP 04/16/16 1815 255/177 mmHg     Pulse Rate 04/16/16 1815 99  Resp 04/16/16 1815 18     Temp 04/16/16 1815 98.8 F (37.1 C)     Temp Source 04/16/16 1815 Oral     SpO2 04/16/16 1815 100 %     Weight 04/16/16 1815 245 lb (111.131 kg)     Height 04/16/16 1815 6\' 1"  (1.854 m)     Head Cir --      Peak Flow --      Pain Score 04/16/16 1820 7     Pain Loc --      Pain Edu? --      Excl. in [place]? --      Constitutional: Alert and oriented. Well appearingOverall and in no distress. He does appear somewhat anxious. HEENT   Head: Normocephalic and atraumatic.      Eyes: Conjunctivae are normal. PERRL. Normal extraocular movements.      Ears:         Nose: No congestion/rhinnorhea.   Mouth/Throat: Mucous membranes are moist.   Neck: No stridor. Cardiovascular/Chest: Normal  rate, regular rhythm.  No murmurs, rubs, or gallops. Respiratory: Normal respiratory effort without tachypnea nor retractions. Breath sounds are clear and equal bilaterally. No wheezes/rales/rhonchi. Gastrointestinal: Soft. No distention, no guarding, no rebound. Nontender.    Genitourinary/rectal:Deferred Musculoskeletal: Nontender with normal range of motion in all extremities. No joint effusions.  No lower extremity tenderness.  No edema. Neurologic:  Normal speech and language. No gross or focal neurologic deficits are appreciated. Skin:  Skin is warm, dry and intact. No rash noted. Psychiatric: Mood and affect are normal. Speech and behavior are normal. Patient exhibits appropriate insight and judgment.  ____________________________________________   EKG I, Lisa Roca, MD, the attending physician have personally viewed and interpreted all ECGs.  102 bpm. Sinus tachycardia. Nonspecific interventricular conduction delay. LVH with repolarization abnormalities. Nonspecific T-wave ____________________________________________  LABS (pertinent positives/negatives)  Labs Reviewed  BASIC METABOLIC PANEL - Abnormal; Notable for the following:    Sodium 133 (*)    Potassium 2.2 (*)    Chloride 90 (*)    Glucose, Bld 104 (*)    BUN 28 (*)    Creatinine, Ser 5.04 (*)    GFR calc non Af Amer 14 (*)    GFR calc Af Amer 16 (*)    All other components within normal limits  CBC - Abnormal; Notable for the following:    WBC 14.6 (*)    Platelets 142 (*)    All other components within normal limits  TROPONIN I - Abnormal; Notable for the following:    Troponin I 0.26 (*)    All other components within normal limits    ____________________________________________  RADIOLOGY All Xrays were viewed by me. Imaging interpreted by Radiologist.  Chest x-ray portable:  IMPRESSION: No evidence of acute cardiopulmonary abnormality. Cardiomegaly is stable or slightly increased.  CT head  noncontrast:  Negative noncontrast head CT. __________________________________________  PROCEDURES  Procedure(s) performed: None  Critical Care performed: CRITICAL CARE Performed by: Lisa Roca   Total critical care time: 45 minutes  Critical care time was exclusive of separately billable procedures and treating other patients.  Critical care was necessary to treat or prevent imminent or life-threatening deterioration.  Critical care was time spent personally by me on the following activities: development of treatment plan with patient and/or surrogate as well as nursing, discussions with consultants, evaluation of patient's response to treatment, examination of patient, obtaining history from patient or surrogate, ordering and performing treatments and interventions, ordering and review of laboratory  studies, ordering and review of radiographic studies, pulse oximetry and re-evaluation of patient's condition.   ____________________________________________   ED COURSE / ASSESSMENT AND PLAN  Pertinent labs & imaging results that were available during my care of the patient were reviewed by me and considered in my medical decision making (see chart for details).   The patient was found to be severely hypertensive in triage, with a map of 185. He is complaining of numerous end organ complaints including headache, blurry vision and chest pain, but without altered mental status or new neurologic complaint, I think it safer to get his blood pressure under control rather than send him to CT at this home in time.  Cause of current severe hypertension is medication noncompliance as he has been out of his medicines for about a week and half.   Patient was given 20 mg labetalol in triage, and then I ordered a second dose 40 mg labetalol when the patient arrived back to this room.  Next blood pressure 225/52 with a map of 170, which is some decrease, the patient needs additional control.  IV labetalol 80 mg was dosed and patient was taken to CT scan.  I reviewed the head CT, and saw no hemorrhage.  His laboratory studies did come back showing multiple abnormalities including low potassium for which she was given by mouth and IV repletion. He also had a minimally elevated troponin for which after head CT confirm no hemorrhage, patient was given aspirin.  He is also showing acute renal failure. I discussed with on-call nephrologist, Dr.Kolluru, who will follow along in consult.    I spoke with the on-call ICU physician Dr.Mungal, who does not recommend intensivist admission, the hospitalist admission was stepped on ICU bed.  I spoke with the hospitalist doctor, Dr. Claria Dice for admission.  Patient's repeat blood pressure after 20 mg, 40 mg, then 80 mg labetalol was reported 195/115 which would make his map adequate we reduced, however upon recheck it was 100/135, map 150 and I think he needs another dose of labetalol.    CONSULTATIONS:   Nephrologist, Intensivist, Hospitalist   Patient / Family / Caregiver informed of clinical course, medical decision-making process, and agree with plan.   ___________________________________________   FINAL CLINICAL IMPRESSION(S) / ED DIAGNOSES   Final diagnoses:  Hypertensive emergency  Acute renal failure, unspecified acute renal failure type ([place])  Troponin level elevated  Hypokalemia              Note: This dictation was prepared with Dragon dictation. Any transcriptional errors that result from this process are unintentional   Lisa Roca, MD 04/16/16 2017

## 2016-04-17 ENCOUNTER — Inpatient Hospital Stay
Admit: 2016-04-17 | Discharge: 2016-04-17 | Disposition: A | Discharge status: Home / Self Care | Payer: Self-pay | Attending: Family Medicine | Admitting: Family Medicine

## 2016-04-17 ENCOUNTER — Inpatient Hospital Stay: Payer: Self-pay

## 2016-04-17 LAB — URINE DRUG SCREEN, QUALITATIVE (ARMC ONLY)
AMPHETAMINES, UR SCREEN: NOT DETECTED
Barbiturates, Ur Screen: NOT DETECTED
Benzodiazepine, Ur Scrn: NOT DETECTED
Cannabinoid 50 Ng, Ur ~~LOC~~: NOT DETECTED
Cocaine Metabolite,Ur ~~LOC~~: NOT DETECTED
MDMA (ECSTASY) UR SCREEN: NOT DETECTED
Methadone Scn, Ur: NOT DETECTED
OPIATE, UR SCREEN: NOT DETECTED
PHENCYCLIDINE (PCP) UR S: NOT DETECTED
Tricyclic, Ur Screen: NOT DETECTED

## 2016-04-17 LAB — CBC
HEMATOCRIT: 37.7 % — AB (ref 40.0–52.0)
Hemoglobin: 12.7 g/dL — ABNORMAL LOW (ref 13.0–18.0)
MCH: 27.8 pg (ref 26.0–34.0)
MCHC: 33.7 g/dL (ref 32.0–36.0)
MCV: 82.6 fL (ref 80.0–100.0)
PLATELETS: 113 10*3/uL — AB (ref 150–440)
RBC: 4.56 MIL/uL (ref 4.40–5.90)
RDW: 14.8 % — AB (ref 11.5–14.5)
WBC: 12 10*3/uL — ABNORMAL HIGH (ref 3.8–10.6)

## 2016-04-17 LAB — BASIC METABOLIC PANEL
Anion gap: 8 (ref 5–15)
BUN: 35 mg/dL — AB (ref 6–20)
CALCIUM: 8.4 mg/dL — AB (ref 8.9–10.3)
CO2: 31 mmol/L (ref 22–32)
CREATININE: 4.92 mg/dL — AB (ref 0.61–1.24)
Chloride: 95 mmol/L — ABNORMAL LOW (ref 101–111)
GFR calc Af Amer: 17 mL/min — ABNORMAL LOW (ref >60)
GFR, EST NON AFRICAN AMERICAN: 14 mL/min — AB (ref >60)
GLUCOSE: 139 mg/dL — AB (ref 65–99)
Potassium: 2.5 mmol/L — CL (ref 3.5–5.1)
Sodium: 134 mmol/L — ABNORMAL LOW (ref 135–145)

## 2016-04-17 LAB — TROPONIN I
TROPONIN I: 0.22 ng/mL — AB (ref <0.031)
TROPONIN I: 0.21 ng/mL — AB (ref <0.031)
TROPONIN I: 0.32 ng/mL — AB (ref <0.031)

## 2016-04-17 LAB — POTASSIUM
POTASSIUM: 3 mmol/L — AB (ref 3.5–5.1)
Potassium: 2.9 mmol/L — CL (ref 3.5–5.1)

## 2016-04-17 LAB — ECHOCARDIOGRAM COMPLETE
Height: 73 in
Weight: 3920 oz

## 2016-04-17 LAB — MAGNESIUM: MAGNESIUM: 2.2 mg/dL (ref 1.7–2.4)

## 2016-04-17 LAB — CORTISOL: Cortisol, Plasma: 15.9 ug/dL

## 2016-04-17 LAB — MRSA PCR SCREENING: MRSA BY PCR: NEGATIVE

## 2016-04-17 MED ORDER — POTASSIUM CHLORIDE 10 MEQ/100ML IV SOLN
10.0000 meq | INTRAVENOUS | Status: AC
Start: 2016-04-17 — End: 2016-04-18
  Administered 2016-04-17 (×3): 10 meq via INTRAVENOUS
  Filled 2016-04-17 (×5): qty 100

## 2016-04-17 MED ORDER — POTASSIUM CHLORIDE CRYS ER 20 MEQ PO TBCR
60.0000 meq | EXTENDED_RELEASE_TABLET | Freq: Once | ORAL | Status: AC
  Administered 2016-04-17: 60 meq via ORAL

## 2016-04-17 MED ORDER — CLONIDINE HCL 0.1 MG PO TABS
0.1000 mg | ORAL_TABLET | Freq: Two times a day (BID) | ORAL | Status: DC
  Administered 2016-04-17 – 2016-04-18 (×2): 0.1 mg via ORAL
  Filled 2016-04-17 (×2): qty 1

## 2016-04-17 MED ORDER — POTASSIUM CHLORIDE CRYS ER 20 MEQ PO TBCR: 40.0000 meq | EXTENDED_RELEASE_TABLET | Freq: Once | ORAL | Status: DC

## 2016-04-17 MED ORDER — POTASSIUM CHLORIDE CRYS ER 20 MEQ PO TBCR
20.0000 meq | EXTENDED_RELEASE_TABLET | Freq: Once | ORAL | Status: AC
  Administered 2016-04-17: 20 meq via ORAL
  Filled 2016-04-17: qty 1

## 2016-04-17 MED ORDER — POTASSIUM CHLORIDE 10 MEQ/100ML IV SOLN
10.0000 meq | Freq: Once | INTRAVENOUS | Status: AC
  Administered 2016-04-17: 10 meq via INTRAVENOUS
  Filled 2016-04-17: qty 100

## 2016-04-17 MED ORDER — CLONIDINE HCL 0.1 MG PO TABS
0.2000 mg | ORAL_TABLET | Freq: Once | ORAL | Status: AC
Start: 2016-04-17 — End: 2016-04-17
  Administered 2016-04-17: 0.2 mg via ORAL
  Filled 2016-04-17: qty 2

## 2016-04-17 NOTE — Progress Notes (Signed)
75 Dr. Leslye Peer informed of continued DBP over 100.  Orders received.

## 2016-04-17 NOTE — Progress Notes (Signed)
Patient ID: Alan Gross, male   DOB: [birth year], 30 y.o.   MRN: [id number] [facility] PROGRESS NOTE  Alan Gross [id number] DOB: 06/06/1986 DOA: 04/16/2016 PCP: No PCP Per Patient  HPI/Subjective: Patient had vision disturbance left eye yesterday. It seems better today. Last week he had problems with his left side of his face. He says he's been out of his blood pressure medications for about a week and a half. He is scared that he has cancer because he has nausea and vomiting and weight loss.  Objective: Filed Vitals:   04/17/16 1500 04/17/16 1600  BP: 151/99 144/87  Pulse: 66 69  Temp:  98.2 F (36.8 C)  Resp: 19 22    Filed Weights   04/16/16 1815  Weight: 111.131 kg (245 lb)    ROS: Review of Systems  Constitutional: Positive for weight loss and malaise/fatigue. Negative for fever and chills.  Eyes: Positive for blurred vision.  Respiratory: Negative for cough and shortness of breath.   Cardiovascular: Negative for chest pain.  Gastrointestinal: Positive for nausea, vomiting and abdominal pain. Negative for diarrhea and constipation.  Genitourinary: Negative for dysuria.  Musculoskeletal: Negative for joint pain.  Neurological: Negative for dizziness and headaches.   Exam: Physical Exam  Constitutional: He is oriented to person, place, and time.  HENT:  Nose: No mucosal edema.  Mouth/Throat: No oropharyngeal exudate or posterior oropharyngeal edema.  Eyes: Conjunctivae, EOM and lids are normal. Pupils are equal, round, and reactive to light.  Neck: No JVD present. Carotid bruit is not present. No edema present. No thyroid mass and no thyromegaly present.  Cardiovascular: S1 normal and S2 normal.  Exam reveals no gallop.   Murmur heard.  Systolic murmur is present with a grade of 3/6  Pulses:      Dorsalis pedis pulses are 2+ on the right side, and 2+ on the left side.  Respiratory: No respiratory distress. He has no wheezes. He has no rhonchi. He has  no rales.  GI: Soft. Bowel sounds are normal. There is no tenderness.  Musculoskeletal:       Right ankle: He exhibits swelling.       Left ankle: He exhibits swelling.  Lymphadenopathy:    He has no cervical adenopathy.  Neurological: He is alert and oriented to person, place, and time. No cranial nerve deficit.  Skin: Skin is warm. No rash noted. Nails show no clubbing.  Psychiatric: He has a normal mood and affect.      Data Reviewed: Basic Metabolic Panel:  Recent Labs Lab 04/16/16 1825 04/16/16 2045 04/17/16 0457 04/17/16 1034 04/17/16 1311  NA 133* 134* 134*  --   --   K 2.2* 2.4* 2.5*  --  2.9*  CL 90* 91* 95*  --   --   CO2 30 32 31  --   --   GLUCOSE 104* 140* 139*  --   --   BUN 28* 29* 35*  --   --   CREATININE 5.04* 4.46* 4.92*  --   --   CALCIUM 9.2 9.0 8.4*  --   --   MG  --   --   --  2.2  --    CBC:  Recent Labs Lab 04/16/16 1825 04/16/16 2045 04/17/16 0457  WBC 14.6* 13.2* 12.0*  HGB 14.2 13.5 12.7*  HCT 42.5 40.7 37.7*  MCV 82.9 83.0 82.6  PLT 142* 129* 113*   Cardiac Enzymes:  Recent Labs Lab 04/16/16 1825 04/16/16 2045 04/17/16 0457  04/17/16 1034  TROPONINI 0.26* 0.22* 0.21* 0.32*    CBG:  Recent Labs Lab 04/16/16 2254  GLUCAP 150*    Recent Results (from the past 240 hour(s))  MRSA PCR Screening     Status: None   Collection Time: 04/16/16 11:03 PM  Result Value Ref Range Status   MRSA by PCR NEGATIVE NEGATIVE Final    Comment:        The GeneXpert MRSA Assay (FDA approved for NASAL specimens only), is one component of a comprehensive MRSA colonization surveillance program. It is not intended to diagnose MRSA infection nor to guide or monitor treatment for MRSA infections.      Studies: Ct Head Wo Contrast  04/16/2016  CLINICAL DATA:  Blurred vision for 3 days.  Hypertension. EXAM: CT HEAD WITHOUT CONTRAST TECHNIQUE: Contiguous axial images were obtained from the base of the skull through the vertex without  intravenous contrast. COMPARISON:  10/14/2013 FINDINGS: No evidence of intracranial hemorrhage, brain edema, or other signs of acute infarction. No evidence of intracranial mass lesion or mass effect. No abnormal extraaxial fluid collections identified. Ventricles are normal in size. No skull abnormality identified. IMPRESSION: Negative noncontrast head CT. Electronically Signed   By: Earle Gell M.D.   On: 04/16/2016 20:07   US Renal  04/17/2016  CLINICAL DATA:  Acute renal failure. EXAM: RENAL / URINARY TRACT ULTRASOUND COMPLETE COMPARISON:  None. FINDINGS: Right Kidney: Length: 11.6 cm. Slightly increased echogenicity of renal parenchyma is noted. No mass or hydronephrosis visualized. Left Kidney: Length: 11.8 cm. Slightly increased echogenicity of renal parenchyma is noted. No mass or hydronephrosis visualized. Bladder: Appears normal for degree of bladder distention. Bilateral ureteral jets are noted. IMPRESSION: Slightly increased echogenicity of renal parenchyma is noted suggesting medical renal disease. No hydronephrosis or renal obstruction is noted. Electronically Signed   By: Marijo Conception, M.D.   On: 04/17/2016 11:26   Dg Chest Port 1 View  04/16/2016  CLINICAL DATA:  Chest pain for 4 days. Shortness of breath, nausea and vomiting, dizziness, fatigue, loss of appetite. EXAM: PORTABLE CHEST 1 VIEW COMPARISON:  Chest x-rays dated 08/10/2015, 02/14/2015 and 10/14/2013. FINDINGS: The cardiomegaly is stable or slightly increased compared to the previous exams. Pulmonary vasculature is stable. Lungs appear clear. No evidence of pneumonia. No pleural effusion or pneumothorax seen. Osseous and soft tissue structures about the chest are unremarkable. IMPRESSION: No evidence of acute cardiopulmonary abnormality. Cardiomegaly is stable or slightly increased. Electronically Signed   By: Franki Cabot M.D.   On: 04/16/2016 19:36    Scheduled Meds: . amLODipine  10 mg Oral Daily  . aspirin  325 mg Oral  Daily  . carvedilol  25 mg Oral BID WC  . cloNIDine  0.1 mg Oral BID  . heparin  5,000 Units Subcutaneous Q8H  . hydrALAZINE  50 mg Oral Q8H  . labetalol  80 mg Intravenous Once  . sodium chloride flush  3 mL Intravenous Q12H   Continuous Infusions: . nitroGLYCERIN Stopped (04/17/16 1600)    Assessment/Plan:  1. Hypertensive urgency with vision loss. I started clonidine along with his other medications and blood pressure did come down so I think we can get off the nitroglycerin drip. Once off the nitroglycerin drip we can get out of the ICU. 2. Visual disturbance. I will get an MRI of the brain tomorrow. Continue aspirin. 3. Acute kidney injury.  Ultrasound of the kidneys show medical renal disease. Appreciate nephrology consultation. Last year his creatinine was 1.3. He  likely has some element of chronic kidney disease. Will have to see where his creatinine levels off 2. 4. Severe hypokalemia. With his kidney function being where it is will be less aggressive and replacing. 5. Elevated troponin. Likely demand ischemia 6. Thrombocytopenia. Seems chronic check a hepatitis C profile  Code Status:     Code Status Orders        Start     Ordered   04/16/16 2251  Full code   Continuous     04/16/16 2251    Code Status History    Date Active Date Inactive Code Status Order ID Comments User Context   05/02/2015  7:22 PM 05/04/2015  2:49 PM Full Code [id number]  Demetrios Loll, MD Inpatient   05/02/2015  3:32 PM 05/02/2015  7:22 PM Full Code [id number]  Dionisio David, MD ED     Family Communication: Family at bedside Disposition Plan: Home once blood pressure better controlled and creatinine stabilizes  Consultants:  Nephrology  Time spent:  25 minutes  [place], [place]

## 2016-04-17 NOTE — Consult Note (Signed)
[facility]  CONSULT NOTE    Date: 04/17/2016                  Patient Name:  Alan Gross  MRN: [id number]  DOB: 1986/06/20  Age / Sex: 30 y.o., male         PCP: No PCP Per Patient                 Service Requesting Consult: Dr. Reita Cliche                 Reason for Consult: Acute renal failure/chronic kidney disease and hypertension            History of Present Illness: Alan Gross is a 30 y.o. black  male with hypertension, childhood seizures, TIA and chronic kidney disease, who was admitted to [facility] on 04/16/2016 for Hypokalemia [E87.6] High blood pressure [I10] Troponin level elevated [R79.89] Hypertensive emergency [I16.1] Acute renal failure, unspecified acute renal failure type [facility]) [N17.9]  Patient recently moved from [facility] to [place]. He had not gotten his home medications transferred over to a local pharmacy and ran out. He was previously on amlodipine, carvedilol, hydralazine and spironolactone. He experienced chest pain, dizziness, nausea/vomiting and headache. Presented to ED with hypertensive urgency and blurry vision/diplopia.   Patient has been diagnosed with hypertension for several years. He also states that he was told he had kidney failure. He is unable to elaborate more. Denies a secondary work up.    Medications: Outpatient medications: Prescriptions prior to admission  Medication Sig Dispense Refill Last Dose  . amLODipine (NORVASC) 10 MG tablet Take 10 mg by mouth daily.   Past Month at Unknown time  . aspirin 325 MG tablet Take 325 mg by mouth daily.   Past Month at Unknown time  . carvedilol (COREG) 25 MG tablet Take 25 mg by mouth 2 (two) times daily with a meal.   Past Month at Unknown time  . hydrALAZINE (APRESOLINE) 50 MG tablet Take 50 mg by mouth 2 (two) times daily.   Past Month at Unknown time  . spironolactone (ALDACTONE) 25 MG tablet Take 25 mg by mouth daily.   Past Month at Unknown time    Current  medications: Current Facility-Administered Medications  Medication Dose Route Frequency Provider Last Rate Last Dose  . acetaminophen (TYLENOL) tablet 650 mg  650 mg Oral Q6H PRN Quintella Baton, MD   650 mg at 04/16/16 2333   Or  . acetaminophen (TYLENOL) suppository 650 mg  650 mg Rectal Q6H PRN Debby Crosley, MD      . amLODipine (NORVASC) tablet 10 mg  10 mg Oral Daily Debby Crosley, MD   10 mg at 04/16/16 2216  . aspirin tablet 325 mg  325 mg Oral Daily Debby Crosley, MD      . carvedilol (COREG) tablet 25 mg  25 mg Oral BID WC Debby Crosley, MD   25 mg at 04/16/16 2216  . heparin injection 5,000 Units  5,000 Units Subcutaneous Q8H Debby Crosley, MD   5,000 Units at 04/17/16 0517  . hydrALAZINE (APRESOLINE) tablet 50 mg  50 mg Oral Q8H Debby Crosley, MD   50 mg at 04/17/16 0517  . labetalol (NORMODYNE,TRANDATE) injection 80 mg  80 mg Intravenous Once Lisa Roca, MD   Stopped at 04/16/16 2013  . nitroGLYCERIN 50 mg in dextrose 5 % 250 mL (0.2 mg/mL) infusion  0-200 mcg/min Intravenous Titrated Debby Crosley, MD 2.3 mL/hr at 04/17/16 [phone]  7.5 mcg/min at 04/17/16 0606  . ondansetron (ZOFRAN) tablet 4 mg  4 mg Oral Q6H PRN Debby Crosley, MD       Or  . ondansetron (ZOFRAN) injection 4 mg  4 mg Intravenous Q6H PRN Debby Crosley, MD      . senna-docusate (Senokot-S) tablet 1 tablet  1 tablet Oral QHS PRN Debby Crosley, MD      . sodium chloride flush (NS) 0.9 % injection 3 mL  3 mL Intravenous Q12H Debby Crosley, MD   3 mL at 04/16/16 2333      Allergies: No Known Allergies    Past Medical History: Past Medical History  Diagnosis Date  . Hypertension   . Seizures ([place])     when he was a child  . H/O cardiac catheterization      Past Surgical History: Past Surgical History  Procedure Laterality Date  . Coronary angioplasty    . Cardiac catheterization Left 05/02/2015    Procedure: Left Heart Cath and Coronary Angiography;  Surgeon: Dionisio David, MD;  Location: [facility];  Service: Cardiovascular;  Laterality: Left;     Family History: No family history on file.   Social History: Social History   Social History  . Marital Status: Single    Spouse Name: N/A  . Number of Children: N/A  . Years of Education: N/A   Occupational History  . Not on file.   Social History Main Topics  . Smoking status: Current Every Day Smoker -- 1.00 packs/day for 10 years    Types: Cigarettes  . Smokeless tobacco: Current User    Last Attempt to Quit: 05/02/2015  . Alcohol Use: 2.4 oz/week    4 Cans of beer per week     Comment: occas.   . Drug Use: Yes    Special: Marijuana  . Sexual Activity: Not on file   Other Topics Concern  . Not on file   Social History Narrative     Review of Systems: Review of Systems  Constitutional: Positive for diaphoresis. Negative for fever, chills, weight loss and malaise/fatigue.  HENT: Negative for congestion, ear discharge, ear pain, hearing loss, nosebleeds, sore throat and tinnitus.   Eyes: Positive for blurred vision and double vision. Negative for photophobia, pain, discharge and redness.  Respiratory: Positive for shortness of breath. Negative for cough, hemoptysis, sputum production, wheezing and stridor.   Cardiovascular: Positive for chest pain and palpitations. Negative for orthopnea, claudication, leg swelling and PND.  Gastrointestinal: Positive for nausea and vomiting. Negative for heartburn, abdominal pain, diarrhea, constipation, blood in stool and melena.  Genitourinary: Negative for dysuria, urgency, frequency, hematuria and flank pain.  Musculoskeletal: Negative for myalgias, back pain, joint pain, falls and neck pain.  Skin: Negative for itching and rash.  Neurological: Positive for dizziness and headaches. Negative for tingling, tremors, sensory change, speech change, focal weakness, seizures, loss of consciousness and weakness.  Endo/Heme/Allergies: Negative for environmental allergies and  polydipsia. Does not bruise/bleed easily.  Psychiatric/Behavioral: Negative for depression, suicidal ideas, hallucinations, memory loss and substance abuse. The patient is not nervous/anxious and does not have insomnia.     Vital Signs: Blood pressure 189/128, pulse 76, temperature 98.1 F (36.7 C), temperature source Oral, resp. rate 20, height 6\' 1"  (1.854 m), weight 111.131 kg (245 lb), SpO2 100 %.  Weight trends: Filed Weights   04/16/16 1815  Weight: 111.131 kg (245 lb)    Physical Exam: General: NAD, laying in bed, well built  Head: Normocephalic, atraumatic. Moist oral mucosal membranes  Eyes: Anicteric, PERRL  Neck: Supple, trachea midline  Lungs:  Clear to auscultation  Heart: Regular rate and rhythm  Abdomen:  Soft, nontender,   Extremities:  no peripheral edema.  Neurologic: Nonfocal, moving all four extremities  Skin: No lesions        Lab results: Basic Metabolic Panel:  Recent Labs Lab 04/16/16 1825 04/16/16 2045 04/17/16 0457  NA 133* 134* 134*  K 2.2* 2.4* 2.5*  CL 90* 91* 95*  CO2 30 32 31  GLUCOSE 104* 140* 139*  BUN 28* 29* 35*  CREATININE 5.04* 4.46* 4.92*  CALCIUM 9.2 9.0 8.4*    Liver Function Tests: No results for input(s): AST, ALT, ALKPHOS, BILITOT, PROT, ALBUMIN in the last 168 hours. No results for input(s): LIPASE, AMYLASE in the last 168 hours. No results for input(s): AMMONIA in the last 168 hours.  CBC:  Recent Labs Lab 04/16/16 1825 04/16/16 2045 04/17/16 0457  WBC 14.6* 13.2* 12.0*  HGB 14.2 13.5 12.7*  HCT 42.5 40.7 37.7*  MCV 82.9 83.0 82.6  PLT 142* 129* 113*    Cardiac Enzymes:  Recent Labs Lab 04/16/16 1825 04/16/16 2045 04/17/16 0457  TROPONINI 0.26* 0.22* 0.21*    BNP: Invalid input(s): POCBNP  CBG:  Recent Labs Lab 04/16/16 2254  GLUCAP 150*    Microbiology: Results for orders placed or performed during the hospital encounter of 04/16/16  MRSA PCR Screening     Status: None   Collection  Time: 04/16/16 11:03 PM  Result Value Ref Range Status   MRSA by PCR NEGATIVE NEGATIVE Final    Comment:        The GeneXpert MRSA Assay (FDA approved for NASAL specimens only), is one component of a comprehensive MRSA colonization surveillance program. It is not intended to diagnose MRSA infection nor to guide or monitor treatment for MRSA infections.     Coagulation Studies: No results for input(s): LABPROT, INR in the last 72 hours.  Urinalysis: No results for input(s): COLORURINE, LABSPEC, PHURINE, GLUCOSEU, HGBUR, BILIRUBINUR, KETONESUR, PROTEINUR, UROBILINOGEN, NITRITE, LEUKOCYTESUR in the last 72 hours.  Invalid input(s): APPERANCEUR    Imaging: Ct Head Wo Contrast  04/16/2016  CLINICAL DATA:  Blurred vision for 3 days.  Hypertension. EXAM: CT HEAD WITHOUT CONTRAST TECHNIQUE: Contiguous axial images were obtained from the base of the skull through the vertex without intravenous contrast. COMPARISON:  10/14/2013 FINDINGS: No evidence of intracranial hemorrhage, brain edema, or other signs of acute infarction. No evidence of intracranial mass lesion or mass effect. No abnormal extraaxial fluid collections identified. Ventricles are normal in size. No skull abnormality identified. IMPRESSION: Negative noncontrast head CT. Electronically Signed   By: Earle Gell M.D.   On: 04/16/2016 20:07   Dg Chest Port 1 View  04/16/2016  CLINICAL DATA:  Chest pain for 4 days. Shortness of breath, nausea and vomiting, dizziness, fatigue, loss of appetite. EXAM: PORTABLE CHEST 1 VIEW COMPARISON:  Chest x-rays dated 08/10/2015, 02/14/2015 and 10/14/2013. FINDINGS: The cardiomegaly is stable or slightly increased compared to the previous exams. Pulmonary vasculature is stable. Lungs appear clear. No evidence of pneumonia. No pleural effusion or pneumothorax seen. Osseous and soft tissue structures about the chest are unremarkable. IMPRESSION: No evidence of acute cardiopulmonary abnormality.  Cardiomegaly is stable or slightly increased. Electronically Signed   By: Franki Cabot M.D.   On: 04/16/2016 19:36      Assessment & Plan: Mr. Alegend Lieske is a 30 y.o. black  male with hypertension, childhood seizures, TIA and chronic kidney disease, who was admitted to [facility] on 04/16/2016  1. Acute renal failure: acute renal injury from hypertensive emergency. Baseline creatinine of 1.3 (07/14/2015).   - check renal ultrasound, urinalysis, spot urine protein to creatinine ratio.  - Check random cortisol, renin and aldo levels.  - Continue to monitor volume status. Renally dose medications. Avoid diuretics and ACE-i/ARB.  2. Hypertension with urgency: on nitro gtt.  - Home regimen to be restarted today: carvedilol, amlodipine and hydralazine.  - Hold spironolactone.  - Will need secondary work up as above.   3. Hypokalemia: renal potassium wasting? From hypermineralcortisolism?  - Replace Potassium.  - Check Magnesium - stop IV NS which causes kaliuresis.   4. Hyponatremia: sodium has dropped. Will contiue to monitor.    LOS: 1 Hasheem Voland 5/31/20179:27 AM

## 2016-04-17 NOTE — Progress Notes (Signed)
*  PRELIMINARY RESULTS* Echocardiogram 2D Echocardiogram has been performed.  Alan Gross 04/17/2016, 3:30 PM

## 2016-04-17 NOTE — Care Management (Signed)
RNCM attempted assessment for uninsured patient with no PCP. Mother and sister at bedside. Patient is sleeping. RNCM will reassess later.

## 2016-04-17 NOTE — Progress Notes (Signed)
1600 NTG drip d/ced per verbal order- Dr. Leslye Peer.

## 2016-04-17 NOTE — Progress Notes (Signed)
MEDICATION RELATED CONSULT NOTE - INITIAL   Pharmacy Consult for Electrolyte management  Indication: hypokalemia  No Known Allergies  Patient Measurements: Height: 6\' 1"  (185.4 cm) Weight: 245 lb (111.131 kg) IBW/kg (Calculated) : 79.9 Adjusted Body Weight:   Vital Signs: Temp: 98.2 F (36.8 C) (05/31 1200) BP: 158/92 mmHg (05/31 1400) Pulse Rate: 55 (05/31 1400) Intake/Output from previous day: 05/30 0701 - 05/31 0700 In: -  Out: 500 [Urine:500] Intake/Output from this shift:    Labs:  Recent Labs  04/16/16 1825 04/16/16 2045 04/17/16 0457 04/17/16 1034  WBC 14.6* 13.2* 12.0*  --   HGB 14.2 13.5 12.7*  --   HCT 42.5 40.7 37.7*  --   PLT 142* 129* 113*  --   CREATININE 5.04* 4.46* 4.92*  --   MG  --   --   --  2.2   Estimated Creatinine Clearance: 28.2 mL/min (by C-G formula based on Cr of 4.92).   Microbiology: Recent Results (from the past 720 hour(s))  MRSA PCR Screening     Status: None   Collection Time: 04/16/16 11:03 PM  Result Value Ref Range Status   MRSA by PCR NEGATIVE NEGATIVE Final    Comment:        The GeneXpert MRSA Assay (FDA approved for NASAL specimens only), is one component of a comprehensive MRSA colonization surveillance program. It is not intended to diagnose MRSA infection nor to guide or monitor treatment for MRSA infections.     Medical History: Past Medical History  Diagnosis Date  . Hypertension   . Seizures ([employer])     when he was a child  . H/O cardiac catheterization     Medications:  Prescriptions prior to admission  Medication Sig Dispense Refill Last Dose  . amLODipine (NORVASC) 10 MG tablet Take 10 mg by mouth daily.   Past Month at Unknown time  . aspirin 325 MG tablet Take 325 mg by mouth daily.   Past Month at Unknown time  . carvedilol (COREG) 25 MG tablet Take 25 mg by mouth 2 (two) times daily with a meal.   Past Month at Unknown time  . hydrALAZINE (APRESOLINE) 50 MG tablet Take 50 mg by mouth 2  (two) times daily.   Past Month at Unknown time  . spironolactone (ALDACTONE) 25 MG tablet Take 25 mg by mouth daily.   Past Month at Unknown time   Scheduled:  . amLODipine  10 mg Oral Daily  . aspirin  325 mg Oral Daily  . carvedilol  25 mg Oral BID WC  . cloNIDine  0.1 mg Oral BID  . heparin  5,000 Units Subcutaneous Q8H  . hydrALAZINE  50 mg Oral Q8H  . labetalol  80 mg Intravenous Once  . potassium chloride  20 mEq Oral Once  . sodium chloride flush  3 mL Intravenous Q12H    Assessment: Pharmacy consulted to assist in the management of electrolytes in this critically ill 30 y/o patient.   K= 2.9 up from 2.5. Patient has received 70 mEq of Potassium thus far and has an order for 20 mEq PO x1   Magnesium: 2.2   Goal of Therapy:  Normalization of electrolytes   Plan:  Will follow up on Potassium level @ 17:00. Will need to be cautious with supplementation of K due to patient being in AKI. Pharmacy to follow and assist with the management of this critically ill patient.    Thank you for the consult.  Kimberlyann Hollar D 04/17/2016,2:41 PM

## 2016-04-17 NOTE — Progress Notes (Signed)
MEDICATION RELATED CONSULT NOTE - INITIAL   Pharmacy Consult for Electrolyte management  Indication: hypokalemia  No Known Allergies  Patient Measurements: Height: 6\' 1"  (185.4 cm) Weight: 245 lb (111.131 kg) IBW/kg (Calculated) : 79.9 Adjusted Body Weight:   Vital Signs: Temp: 98.2 F (36.8 C) (05/31 1600) BP: 150/101 mmHg (05/31 1700) Pulse Rate: 78 (05/31 1700) Intake/Output from previous day: 05/30 0701 - 05/31 0700 In: -  Out: 500 [Urine:500] Intake/Output from this shift:    Labs:  Recent Labs  04/16/16 1825 04/16/16 2045 04/17/16 0457 04/17/16 1034  WBC 14.6* 13.2* 12.0*  --   HGB 14.2 13.5 12.7*  --   HCT 42.5 40.7 37.7*  --   PLT 142* 129* 113*  --   CREATININE 5.04* 4.46* 4.92*  --   MG  --   --   --  2.2   Estimated Creatinine Clearance: 28.2 mL/min (by C-G formula based on Cr of 4.92).   Microbiology: Recent Results (from the past 720 hour(s))  MRSA PCR Screening     Status: None   Collection Time: 04/16/16 11:03 PM  Result Value Ref Range Status   MRSA by PCR NEGATIVE NEGATIVE Final    Comment:        The GeneXpert MRSA Assay (FDA approved for NASAL specimens only), is one component of a comprehensive MRSA colonization surveillance program. It is not intended to diagnose MRSA infection nor to guide or monitor treatment for MRSA infections.     Medical History: Past Medical History  Diagnosis Date  . Hypertension   . Seizures ([place])     when he was a child  . H/O cardiac catheterization     Medications:  Prescriptions prior to admission  Medication Sig Dispense Refill Last Dose  . amLODipine (NORVASC) 10 MG tablet Take 10 mg by mouth daily.   Past Month at Unknown time  . aspirin 325 MG tablet Take 325 mg by mouth daily.   Past Month at Unknown time  . carvedilol (COREG) 25 MG tablet Take 25 mg by mouth 2 (two) times daily with a meal.   Past Month at Unknown time  . hydrALAZINE (APRESOLINE) 50 MG tablet Take 50 mg by mouth 2  (two) times daily.   Past Month at Unknown time  . spironolactone (ALDACTONE) 25 MG tablet Take 25 mg by mouth daily.   Past Month at Unknown time   Scheduled:  . amLODipine  10 mg Oral Daily  . aspirin  325 mg Oral Daily  . carvedilol  25 mg Oral BID WC  . cloNIDine  0.1 mg Oral BID  . heparin  5,000 Units Subcutaneous Q8H  . hydrALAZINE  50 mg Oral Q8H  . labetalol  80 mg Intravenous Once  . potassium chloride  10 mEq Intravenous Q1 Hr x 3  . sodium chloride flush  3 mL Intravenous Q12H    Assessment: Pharmacy consulted to assist in the management of electrolytes in this critically ill 30 y/o patient.   K= 2.9 up from 2.5. Patient has received 70 mEq of Potassium thus far and has an order for 20 mEq PO x1   Magnesium: 2.2   Goal of Therapy:  Normalization of electrolytes   Plan:  Will follow up on Potassium level @ 17:00. Will need to be cautious with supplementation of K due to patient being in AKI. Pharmacy to follow and assist with the management of this critically ill patient.    5/31:  K @ 17:00 =  3.0.   Will order KCl 10 mEq IV X 3 and recheck K on 5/31 @ 23:00.    Alan Gross D 04/17/2016,6:24 PM

## 2016-04-18 ENCOUNTER — Inpatient Hospital Stay: Payer: Self-pay

## 2016-04-18 LAB — PROTEIN ELECTROPHORESIS, SERUM
A/G RATIO SPE: 1.1 (ref 0.7–1.7)
Albumin ELP: 3.1 g/dL (ref 2.9–4.4)
Alpha-1-Globulin: 0.3 g/dL (ref 0.0–0.4)
Alpha-2-Globulin: 0.7 g/dL (ref 0.4–1.0)
BETA GLOBULIN: 1 g/dL (ref 0.7–1.3)
GLOBULIN, TOTAL: 2.9 g/dL (ref 2.2–3.9)
Gamma Globulin: 1 g/dL (ref 0.4–1.8)
TOTAL PROTEIN ELP: 6 g/dL (ref 6.0–8.5)

## 2016-04-18 LAB — BASIC METABOLIC PANEL
ANION GAP: 8 (ref 5–15)
BUN: 31 mg/dL — ABNORMAL HIGH (ref 6–20)
CALCIUM: 8.6 mg/dL — AB (ref 8.9–10.3)
CO2: 30 mmol/L (ref 22–32)
Chloride: 97 mmol/L — ABNORMAL LOW (ref 101–111)
Creatinine, Ser: 4.19 mg/dL — ABNORMAL HIGH (ref 0.61–1.24)
GFR calc Af Amer: 20 mL/min — ABNORMAL LOW (ref >60)
GFR, EST NON AFRICAN AMERICAN: 17 mL/min — AB (ref >60)
GLUCOSE: 110 mg/dL — AB (ref 65–99)
Potassium: 2.8 mmol/L — CL (ref 3.5–5.1)
SODIUM: 135 mmol/L (ref 135–145)

## 2016-04-18 LAB — POTASSIUM: Potassium: 3.4 mmol/L — ABNORMAL LOW (ref 3.5–5.1)

## 2016-04-18 MED ORDER — POTASSIUM CHLORIDE ER 10 MEQ PO TBCR: 10.0000 meq | EXTENDED_RELEASE_TABLET | Freq: Every day | ORAL | Status: DC

## 2016-04-18 MED ORDER — POTASSIUM CHLORIDE CRYS ER 20 MEQ PO TBCR
20.0000 meq | EXTENDED_RELEASE_TABLET | Freq: Once | ORAL | Status: AC
  Administered 2016-04-18: 20 meq via ORAL
  Filled 2016-04-18: qty 1

## 2016-04-18 MED ORDER — CLONIDINE HCL 0.1 MG PO TABS: 0.1000 mg | ORAL_TABLET | Freq: Once | ORAL | Status: DC

## 2016-04-18 MED ORDER — CLONIDINE HCL 0.1 MG PO TABS
0.2000 mg | ORAL_TABLET | Freq: Once | ORAL | Status: AC
  Administered 2016-04-18: 0.2 mg via ORAL
  Filled 2016-04-18: qty 2

## 2016-04-18 MED ORDER — ALPRAZOLAM 0.5 MG PO TABS
0.5000 mg | ORAL_TABLET | Freq: Once | ORAL | Status: AC
  Administered 2016-04-18: 0.5 mg via ORAL
  Filled 2016-04-18: qty 1

## 2016-04-18 MED ORDER — HYDRALAZINE HCL 50 MG PO TABS: 50.0000 mg | ORAL_TABLET | Freq: Three times a day (TID) | ORAL | Status: DC

## 2016-04-18 MED ORDER — POTASSIUM CHLORIDE CRYS ER 20 MEQ PO TBCR
40.0000 meq | EXTENDED_RELEASE_TABLET | Freq: Once | ORAL | Status: AC
  Administered 2016-04-18: 40 meq via ORAL
  Filled 2016-04-18: qty 2

## 2016-04-18 MED ORDER — CLONIDINE HCL 0.2 MG PO TABS: 0.2000 mg | ORAL_TABLET | Freq: Two times a day (BID) | ORAL | Status: DC

## 2016-04-18 MED ORDER — LABETALOL HCL 5 MG/ML IV SOLN
10.0000 mg | Freq: Once | INTRAVENOUS | Status: AC
Start: 2016-04-18 — End: 2016-04-18
  Administered 2016-04-18: 10 mg via INTRAVENOUS
  Filled 2016-04-18: qty 4

## 2016-04-18 NOTE — Progress Notes (Signed)
[facility]  ROUNDING NOTE   Subjective:   Blood pressure not at goal.  Patient wants to go home.    Objective:  Vital signs in last 24 hours:  Temp:  [98.2 F (36.8 C)-98.8 F (37.1 C)] 98.7 F (37.1 C) (06/01 0732) Pulse Rate:  [55-85] 73 (06/01 0900) Resp:  [13-25] 22 (06/01 0900) BP: (136-199)/(75-128) 174/118 mmHg (06/01 0900) SpO2:  [96 %-100 %] 99 % (06/01 0900)  Weight change:  Filed Weights   04/16/16 1815  Weight: 111.131 kg (245 lb)    Intake/Output: I/O last 3 completed shifts: In: 239.9 [I.V.:39.9; IV Piggyback:200] Out: 1250 [Urine:1250]   Intake/Output this shift:     Physical Exam: General: NAD, laying in bed  Head: Normocephalic, atraumatic. Moist oral mucosal membranes  Eyes: Anicteric, PERRL  Neck: Supple, trachea midline  Lungs:  Clear to auscultation  Heart: Regular rate and rhythm  Abdomen:  Soft, nontender,   Extremities: in peripheral edema.  Neurologic: Nonfocal, moving all four extremities  Skin: No lesions       Basic Metabolic Panel:  Recent Labs Lab 04/16/16 1825 04/16/16 2045 04/17/16 0457 04/17/16 1034 04/17/16 1311 04/17/16 1657 04/18/16 0357  NA 133* 134* 134*  --   --   --  135  K 2.2* 2.4* 2.5*  --  2.9* 3.0* 2.8*  CL 90* 91* 95*  --   --   --  97*  CO2 30 32 31  --   --   --  30  GLUCOSE 104* 140* 139*  --   --   --  110*  BUN 28* 29* 35*  --   --   --  31*  CREATININE 5.04* 4.46* 4.92*  --   --   --  4.19*  CALCIUM 9.2 9.0 8.4*  --   --   --  8.6*  MG  --   --   --  2.2  --   --   --     Liver Function Tests: No results for input(s): AST, ALT, ALKPHOS, BILITOT, PROT, ALBUMIN in the last 168 hours. No results for input(s): LIPASE, AMYLASE in the last 168 hours. No results for input(s): AMMONIA in the last 168 hours.  CBC:  Recent Labs Lab 04/16/16 1825 04/16/16 2045 04/17/16 0457  WBC 14.6* 13.2* 12.0*  HGB 14.2 13.5 12.7*  HCT 42.5 40.7 37.7*  MCV 82.9 83.0 82.6  PLT 142* 129* 113*     Cardiac Enzymes:  Recent Labs Lab 04/16/16 1825 04/16/16 2045 04/17/16 0457 04/17/16 1034  TROPONINI 0.26* 0.22* 0.21* 0.32*    BNP: Invalid input(s): POCBNP  CBG:  Recent Labs Lab 04/16/16 2254  GLUCAP 150*    Microbiology: Results for orders placed or performed during the hospital encounter of 04/16/16  MRSA PCR Screening     Status: None   Collection Time: 04/16/16 11:03 PM  Result Value Ref Range Status   MRSA by PCR NEGATIVE NEGATIVE Final    Comment:        The GeneXpert MRSA Assay (FDA approved for NASAL specimens only), is one component of a comprehensive MRSA colonization surveillance program. It is not intended to diagnose MRSA infection nor to guide or monitor treatment for MRSA infections.     Coagulation Studies: No results for input(s): LABPROT, INR in the last 72 hours.  Urinalysis: No results for input(s): COLORURINE, LABSPEC, PHURINE, GLUCOSEU, HGBUR, BILIRUBINUR, KETONESUR, PROTEINUR, UROBILINOGEN, NITRITE, LEUKOCYTESUR in the last 72 hours.  Invalid input(s): APPERANCEUR  Imaging: Ct Head Wo Contrast  04/16/2016  CLINICAL DATA:  Blurred vision for 3 days.  Hypertension. EXAM: CT HEAD WITHOUT CONTRAST TECHNIQUE: Contiguous axial images were obtained from the base of the skull through the vertex without intravenous contrast. COMPARISON:  10/14/2013 FINDINGS: No evidence of intracranial hemorrhage, brain edema, or other signs of acute infarction. No evidence of intracranial mass lesion or mass effect. No abnormal extraaxial fluid collections identified. Ventricles are normal in size. No skull abnormality identified. IMPRESSION: Negative noncontrast head CT. Electronically Signed   By: Earle Gell M.D.   On: 04/16/2016 20:07   US Renal  04/17/2016  CLINICAL DATA:  Acute renal failure. EXAM: RENAL / URINARY TRACT ULTRASOUND COMPLETE COMPARISON:  None. FINDINGS: Right Kidney: Length: 11.6 cm. Slightly increased echogenicity of renal  parenchyma is noted. No mass or hydronephrosis visualized. Left Kidney: Length: 11.8 cm. Slightly increased echogenicity of renal parenchyma is noted. No mass or hydronephrosis visualized. Bladder: Appears normal for degree of bladder distention. Bilateral ureteral jets are noted. IMPRESSION: Slightly increased echogenicity of renal parenchyma is noted suggesting medical renal disease. No hydronephrosis or renal obstruction is noted. Electronically Signed   By: Marijo Conception, M.D.   On: 04/17/2016 11:26   Dg Chest Port 1 View  04/16/2016  CLINICAL DATA:  Chest pain for 4 days. Shortness of breath, nausea and vomiting, dizziness, fatigue, loss of appetite. EXAM: PORTABLE CHEST 1 VIEW COMPARISON:  Chest x-rays dated 08/10/2015, 02/14/2015 and 10/14/2013. FINDINGS: The cardiomegaly is stable or slightly increased compared to the previous exams. Pulmonary vasculature is stable. Lungs appear clear. No evidence of pneumonia. No pleural effusion or pneumothorax seen. Osseous and soft tissue structures about the chest are unremarkable. IMPRESSION: No evidence of acute cardiopulmonary abnormality. Cardiomegaly is stable or slightly increased. Electronically Signed   By: Franki Cabot M.D.   On: 04/16/2016 19:36     Medications:   . nitroGLYCERIN Stopped (04/17/16 1600)   . amLODipine  10 mg Oral Daily  . aspirin  325 mg Oral Daily  . carvedilol  25 mg Oral BID WC  . cloNIDine  0.1 mg Oral BID  . heparin  5,000 Units Subcutaneous Q8H  . hydrALAZINE  50 mg Oral Q8H  . labetalol  80 mg Intravenous Once  . sodium chloride flush  3 mL Intravenous Q12H   acetaminophen **OR** acetaminophen, ondansetron **OR** ondansetron (ZOFRAN) IV, senna-docusate  Assessment/ Plan:  Mr. Parv Rashid is a 30 y.o. black male with hypertension, childhood seizures, TIA and chronic kidney disease, who was admitted to [facility] on 04/16/2016  1. Acute renal failure: acute renal injury from hypertensive emergency. Baseline  creatinine of 1.3 (07/14/2015). random cortisol at range, plasma renin activity level and aldosterone at goal.  - pending urine studies.  - Continue to monitor volume status. Renally dose medications. Avoid diuretics and ACE-i/ARB.  2. Hypertension with urgency: not at goal.  - recommend  Carvedilol 25mg  bid, amlodipine 10mg  daily, clonidine 0.2mg  bid and hydralazine 50mg  tid..  - Hold spironolactone due to renal insufficiency.    3. Hypokalemia: renal potassium wasting? From hypermineralcortisolism? magnesium at goal.  - Replace Potassium.   4. Hyponatremia: sodium 135.    LOS: 2 Teyon Odette 6/1/20179:42 AM

## 2016-04-18 NOTE — Progress Notes (Signed)
Pt's HR in the upper 40's-50's while sleeping with occasional junctional beats. Pt easy to arouse and remains asymptomatic. eLink notified. Will continue to monitor

## 2016-04-18 NOTE — Progress Notes (Signed)
Pt discharged home with family.  Pt refuses wheelchair escort, wants to walk out.  Pt ambulates without difficulty.  Pt alert and oriented, no complaints of pain.  NSR, room air, clear lung sounds, tolerating diet, voids in urinal.  IV's discontinued without incident.  Discharge instructions, follow up appointments, medications and new prescriptions reviewed with pt.  Pt states he understands instructions and has no questions.  Belonging returned to pt.

## 2016-04-18 NOTE — Progress Notes (Signed)
[facility] Progress Note Patient Name: Alan Gross DOB: 1986/01/13 MRN: [id number]   Date of Service  04/18/2016  HPI/Events of Note  K=2.8  eICU Interventions  kcl 7meq PO     Intervention Category Intermediate Interventions: Electrolyte abnormality - evaluation and management  Cruise Baumgardner 04/18/2016, 5:25 AM

## 2016-04-18 NOTE — Discharge Instructions (Signed)
Acute Kidney Injury °Acute kidney injury is any condition in which there is sudden (acute) damage to the kidneys. Acute kidney injury was previously known as acute kidney failure or acute renal failure. The kidneys are two organs that lie on either side of the spine between the middle of the back and the front of the abdomen. The kidneys: °· Remove wastes and extra water from the blood.   °· Produce important hormones. These help keep bones strong, regulate blood pressure, and help create red blood cells.   °· Balance the fluids and chemicals in the blood and tissues. °A small amount of kidney damage may not cause problems, but a large amount of damage may make it difficult or impossible for the kidneys to work the way they should. Acute kidney injury may develop into long-lasting (chronic) kidney disease. It may also develop into a life-threatening disease called end-stage kidney disease. Acute kidney injury can get worse very quickly, so it should be treated right away. Early treatment may prevent other kidney diseases from developing. °CAUSES  °· A problem with blood flow to the kidneys. This may be caused by:   °¨ Blood loss.   °¨ Heart disease.   °¨ Severe burns.   °¨ Liver disease. °· Direct damage to the kidneys. This may be caused by: °¨ Some medicines.   °¨ A kidney infection.   °¨ Poisoning or consuming toxic substances.   °¨ A surgical wound.   °¨ A blow to the kidney area.   °· A problem with urine flow. This may be caused by:   °¨ Cancer.   °¨ Kidney stones.   °¨ An enlarged prostate. °SIGNS AND SYMPTOMS  °· Swelling (edema) of the legs, ankles, or feet.   °· Tiredness (lethargy).   °· Nausea or vomiting.   °· Confusion.   °· Problems with urination, such as:   °¨ Painful or burning feeling during urination.   °¨ Decreased urine production.   °¨ Frequent accidents in children who are potty trained.   °¨ Bloody urine.   °· Muscle twitches and cramps.   °· Shortness of breath.   °· Seizures.   °· Chest  pain or pressure. °Sometimes, no symptoms are present.  °DIAGNOSIS °Acute kidney injury may be detected and diagnosed by tests, including blood, urine, imaging, or kidney biopsy tests.  °TREATMENT °Treatment of acute kidney injury varies depending on the cause and severity of the kidney damage. In mild cases, no treatment may be needed. The kidneys may heal on their own. If acute kidney injury is more severe, your health care provider will treat the cause of the kidney damage, help the kidneys heal, and prevent complications from occurring. Severe cases may require a procedure to remove toxic wastes from the body (dialysis) or surgery to repair kidney damage. Surgery may involve:  °· Repair of a torn kidney.   °· Removal of an obstruction. °HOME CARE INSTRUCTIONS °· Follow your prescribed diet. °· Take medicines only as directed by your health care provider.  °· Do not take any new medicines (prescription, over-the-counter, or nutritional supplements) unless approved by your health care provider. Many medicines can worsen your kidney damage or may need to have the dose adjusted.   °· Keep all follow-up visits as directed by your health care provider. This is important. °· Observe your condition to make sure you are healing as expected. °SEEK IMMEDIATE MEDICAL CARE IF: °· You are feeling ill or have severe pain in the back or side.   °· Your symptoms return or you have new symptoms. °· You have any symptoms of end-stage kidney disease. These include:   °¨ Persistent itchiness.   °¨   Loss of appetite.   °¨ Headaches.   °¨ Abnormally dark or light skin. °¨ Numbness in the hands or feet.   °¨ Easy bruising.   °¨ Frequent hiccups.   °¨ Menstruation stops.   °· You have a fever. °· You have increased urine production. °· You have pain or bleeding when urinating. °MAKE SURE YOU:  °· Understand these instructions. °· Will watch your condition. °· Will get help right away if you are not doing well or get worse. °  °This  information is not intended to replace advice given to you by your health care provider. Make sure you discuss any questions you have with your health care provider. °  °Document Released: 05/20/2011 Document Revised: 11/25/2014 Document Reviewed: 07/03/2012 °Elsevier Interactive Patient Education ©2016 [employer] ° °

## 2016-04-18 NOTE — Progress Notes (Signed)
MEDICATION RELATED CONSULT NOTE - INITIAL   Pharmacy Consult for Electrolyte management  Indication: hypokalemia  No Known Allergies  Patient Measurements: Height: 6\' 1"  (185.4 cm) Weight: 245 lb (111.131 kg) IBW/kg (Calculated) : 79.9 Adjusted Body Weight:   Vital Signs: Temp: 98.7 F (37.1 C) (06/01 0732) Temp Source: Oral (06/01 0732) BP: 152/111 mmHg (06/01 1200) Pulse Rate: 83 (06/01 1200) Intake/Output from previous day: 05/31 0701 - 06/01 0700 In: 239.9 [I.V.:39.9; IV Piggyback:200] Out: 750 [Urine:750] Intake/Output from this shift: Total I/O In: -  Out: 575 [Urine:575]  Labs:  Recent Labs  04/16/16 1825 04/16/16 2045 04/17/16 0457 04/17/16 1034 04/18/16 0357  WBC 14.6* 13.2* 12.0*  --   --   HGB 14.2 13.5 12.7*  --   --   HCT 42.5 40.7 37.7*  --   --   PLT 142* 129* 113*  --   --   CREATININE 5.04* 4.46* 4.92*  --  4.19*  MG  --   --   --  2.2  --    Estimated Creatinine Clearance: 33.1 mL/min (by C-G formula based on Cr of 4.19).   Microbiology: Recent Results (from the past 720 hour(s))  MRSA PCR Screening     Status: None   Collection Time: 04/16/16 11:03 PM  Result Value Ref Range Status   MRSA by PCR NEGATIVE NEGATIVE Final    Comment:        The GeneXpert MRSA Assay (FDA approved for NASAL specimens only), is one component of a comprehensive MRSA colonization surveillance program. It is not intended to diagnose MRSA infection nor to guide or monitor treatment for MRSA infections.     Medical History: Past Medical History  Diagnosis Date  . Hypertension   . Seizures ([place])     when he was a child  . H/O cardiac catheterization     Medications:  Prescriptions prior to admission  Medication Sig Dispense Refill Last Dose  . amLODipine (NORVASC) 10 MG tablet Take 10 mg by mouth daily.   Past Month at Unknown time  . aspirin 325 MG tablet Take 325 mg by mouth daily.   Past Month at Unknown time  . carvedilol (COREG) 25 MG tablet  Take 25 mg by mouth 2 (two) times daily with a meal.   Past Month at Unknown time  . spironolactone (ALDACTONE) 25 MG tablet Take 25 mg by mouth daily.   Past Month at Unknown time  . [DISCONTINUED] hydrALAZINE (APRESOLINE) 50 MG tablet Take 50 mg by mouth 2 (two) times daily.   Past Month at Unknown time   Scheduled:  . amLODipine  10 mg Oral Daily  . aspirin  325 mg Oral Daily  . carvedilol  25 mg Oral BID WC  . cloNIDine  0.1 mg Oral BID  . heparin  5,000 Units Subcutaneous Q8H  . hydrALAZINE  50 mg Oral Q8H  . labetalol  80 mg Intravenous Once  . sodium chloride flush  3 mL Intravenous Q12H    Assessment: Pharmacy consulted to assist in the management of electrolytes in this 30 y/o M admitted with hypertensive urgency and acute on chronic kidney disease.   K=3.4  Goal of Therapy:  Normalization of electrolytes   Plan:  K low this AM and replaced with 40 meq po x 1. Repeat K slighty low. Will replace with KCl 20 meq po once and f/u am labs.    Ulice Dash D 04/18/2016,2:06 PM

## 2016-04-18 NOTE — Progress Notes (Signed)
Pt's BP elevated at 175/112. Dr. Marcille Blanco notified. Awaiting new orders. Will continue to monitor.

## 2016-04-18 NOTE — Progress Notes (Signed)
[place], [place].   04/18/2016  Patient: Alan Gross   Date of Birth:  1986-05-10  Date of admission:  04/16/2016  Date of Discharge  04/18/2016    To Whom it May Concern:   Adahir Yarosh  may return to work on 04/19/18.  PHYSICAL ACTIVITY:  Full  If you have any questions or concerns, please don't hesitate to call.  Sincerely,   Vaughan Basta M.D Pager [phone] Office : [phone]   .

## 2016-04-18 NOTE — Discharge Summary (Signed)
[facility] NAME: Alan Gross    MR#:  [id number]  DATE OF BIRTH:  [birth year]  DATE OF ADMISSION:  04/16/2016 ADMITTING PHYSICIAN: Quintella Baton, MD  DATE OF DISCHARGE: 04/18/2016  PRIMARY CARE PHYSICIAN: No PCP Per Patient    ADMISSION DIAGNOSIS:  Hypokalemia [E87.6] High blood pressure [I10] Troponin level elevated [R79.89] Hypertensive emergency [I16.1] Acute renal failure, unspecified acute renal failure type ([place]) [N17.9]  DISCHARGE DIAGNOSIS:  Active Problems:   HTN (hypertension), malignant   Hypokalemia   Acute renal failure (ARF) ([place])   SECONDARY DIAGNOSIS:   Past Medical History  Diagnosis Date  . Hypertension   . Seizures ([place])     when he was a child  . H/O cardiac catheterization     HOSPITAL COURSE:    30 year-old male with uncontrolled HTN Who presented with chest pain and nausea and found to have significantly elevated blood pressure.  1. Malignant Hypertension: Patient is admitted to stepdown. He was initially placed on nitroglycerin drip. Home medications were resumed with the exception of Aldactone. Patient's blood pressure discharge is still ideally not controlled however patient would like to be discharged home. As discussed with the nephrologist to recommends increasing hydralazine and increasing clonidine dose in addition to Norvasc and Coreg. He will follow-up with Dr. Juleen China in 1 week.  2. Hypokalemia: Potassium was repleted Will be discharged with low-dose supplementation.  3. Acute kidney injury: Ultrasound showed medical renal disease. Acute kidney injury is due to hypertensive emergency with ATN. Patient will have close follow-up with nephrology.  4. Blurred vision and headache: MRI shows Mild nonspecific signal abnormality in the pons eccentric to the left. Top differential considerations in this clinical setting would include mild PRES (posterior reversible encephalopathy  syndrome) and chronic accelerated small vessel disease.Marland Kitchen  DISCHARGE CONDITIONS AND DIET:  Stable on heart healthy diet  CONSULTS OBTAINED:     DRUG ALLERGIES:  No Known Allergies  DISCHARGE MEDICATIONS:   Current Discharge Medication List    START taking these medications   Details  cloNIDine (CATAPRES) 0.2 MG tablet Take 1 tablet (0.2 mg total) by mouth 2 (two) times daily. Qty: 60 tablet, Refills: 0    potassium chloride (K-DUR) 10 MEQ tablet Take 1 tablet (10 mEq total) by mouth daily. Qty: 30 tablet, Refills: 0      CONTINUE these medications which have CHANGED   Details  hydrALAZINE (APRESOLINE) 50 MG tablet Take 1 tablet (50 mg total) by mouth 3 (three) times daily. Qty: 90 tablet, Refills: 0      CONTINUE these medications which have NOT CHANGED   Details  amLODipine (NORVASC) 10 MG tablet Take 10 mg by mouth daily.    aspirin 325 MG tablet Take 325 mg by mouth daily.    carvedilol (COREG) 25 MG tablet Take 25 mg by mouth 2 (two) times daily with a meal.      STOP taking these medications     spironolactone (ALDACTONE) 25 MG tablet               Today   CHIEF COMPLAINT:   Patient doing well this morning. Denies headache or blurred vision. Patient wants to go home. Patient reports that he needs to go back to work. He denies chest pain or shortness of breath.   VITAL SIGNS:  Blood pressure 138/91, pulse 73, temperature 98.7 F (37.1 C), temperature source Oral, resp. rate 22, height 6\' 1"  (1.854 m), weight 111.131  kg (245 lb), SpO2 100 %.   REVIEW OF SYSTEMS:  Review of Systems  Constitutional: Negative for fever, chills and malaise/fatigue.  HENT: Negative for ear discharge, ear pain, hearing loss, nosebleeds and sore throat.   Eyes: Negative for blurred vision and pain.  Respiratory: Negative for cough, hemoptysis, shortness of breath and wheezing.   Cardiovascular: Negative for chest pain, palpitations and leg swelling.   Gastrointestinal: Negative for nausea, vomiting, abdominal pain, diarrhea and blood in stool.  Genitourinary: Negative for dysuria.  Musculoskeletal: Negative for back pain.  Neurological: Negative for dizziness, tremors, speech change, focal weakness, seizures and headaches.  Endo/Heme/Allergies: Does not bruise/bleed easily.  Psychiatric/Behavioral: Negative for depression, suicidal ideas and hallucinations.     PHYSICAL EXAMINATION:  GENERAL:  30 y.o.-year-old patient lying in the bed with no acute distress.  NECK:  Supple, no jugular venous distention. No thyroid enlargement, no tenderness.  LUNGS: Normal breath sounds bilaterally, no wheezing, rales,rhonchi  No use of accessory muscles of respiration.  CARDIOVASCULAR: S1, S2 normal. No murmurs, rubs, or gallops.  ABDOMEN: Soft, non-tender, non-distended. Bowel sounds present. No organomegaly or mass.  EXTREMITIES: No pedal edema, cyanosis, or clubbing.  PSYCHIATRIC: The patient is alert and oriented x 3.  SKIN: No obvious rash, lesion, or ulcer.   DATA REVIEW:   CBC  Recent Labs Lab 04/17/16 0457  WBC 12.0*  HGB 12.7*  HCT 37.7*  PLT 113*    Chemistries   Recent Labs Lab 04/17/16 1034  04/18/16 0357 04/18/16 1108  NA  --   --  135  --   K  --   < > 2.8* 3.4*  CL  --   --  97*  --   CO2  --   --  30  --   GLUCOSE  --   --  110*  --   BUN  --   --  31*  --   CREATININE  --   --  4.19*  --   CALCIUM  --   --  8.6*  --   MG 2.2  --   --   --   < > = values in this interval not displayed.  Cardiac Enzymes  Recent Labs Lab 04/16/16 2045 04/17/16 0457 04/17/16 1034  TROPONINI 0.22* 0.21* 0.32*    Microbiology Results  @[id number] @  RADIOLOGY:  Ct Head Wo Contrast  04/16/2016  CLINICAL DATA:  Blurred vision for 3 days.  Hypertension. EXAM: CT HEAD WITHOUT CONTRAST TECHNIQUE: Contiguous axial images were obtained from the base of the skull through the vertex without intravenous contrast. COMPARISON:   10/14/2013 FINDINGS: No evidence of intracranial hemorrhage, brain edema, or other signs of acute infarction. No evidence of intracranial mass lesion or mass effect. No abnormal extraaxial fluid collections identified. Ventricles are normal in size. No skull abnormality identified. IMPRESSION: Negative noncontrast head CT. Electronically Signed   By: Earle Gell M.D.   On: 04/16/2016 20:07   US Renal  04/17/2016  CLINICAL DATA:  Acute renal failure. EXAM: RENAL / URINARY TRACT ULTRASOUND COMPLETE COMPARISON:  None. FINDINGS: Right Kidney: Length: 11.6 cm. Slightly increased echogenicity of renal parenchyma is noted. No mass or hydronephrosis visualized. Left Kidney: Length: 11.8 cm. Slightly increased echogenicity of renal parenchyma is noted. No mass or hydronephrosis visualized. Bladder: Appears normal for degree of bladder distention. Bilateral ureteral jets are noted. IMPRESSION: Slightly increased echogenicity of renal parenchyma is noted suggesting medical renal disease. No hydronephrosis or renal obstruction is noted. Electronically Signed  By: Marijo Conception, M.D.   On: 04/17/2016 11:26   Dg Chest Port 1 View  04/16/2016  CLINICAL DATA:  Chest pain for 4 days. Shortness of breath, nausea and vomiting, dizziness, fatigue, loss of appetite. EXAM: PORTABLE CHEST 1 VIEW COMPARISON:  Chest x-rays dated 08/10/2015, 02/14/2015 and 10/14/2013. FINDINGS: The cardiomegaly is stable or slightly increased compared to the previous exams. Pulmonary vasculature is stable. Lungs appear clear. No evidence of pneumonia. No pleural effusion or pneumothorax seen. Osseous and soft tissue structures about the chest are unremarkable. IMPRESSION: No evidence of acute cardiopulmonary abnormality. Cardiomegaly is stable or slightly increased. Electronically Signed   By: Franki Cabot M.D.   On: 04/16/2016 19:36      Management plans discussed with the patient and he is in agreement. Stable for discharge home  Patient  should follow up with Dr Sherlon Handing in 1 week  CODE STATUS:     Code Status Orders        Start     Ordered   04/16/16 2251  Full code   Continuous     04/16/16 2251    Code Status History    Date Active Date Inactive Code Status Order ID Comments User Context   05/02/2015  7:22 PM 05/04/2015  2:49 PM Full Code [id number]  Demetrios Loll, MD Inpatient   05/02/2015  3:32 PM 05/02/2015  7:22 PM Full Code [id number]  Dionisio David, MD ED      TOTAL TIME TAKING CARE OF THIS PATIENT: 36 minutes.    Note: This dictation was prepared with Dragon dictation along with smaller phrase technology. Any transcriptional errors that result from this process are unintentional.  Elyn Krogh M.D on 04/18/2016 at 12:26 PM  Between 7am to 6pm - Pager - [phone] After 6pm go to [email or web address] - password [email or web address] [facility]  Office  [phone]  CC: Primary care physician; No PCP Per Patient

## 2016-04-18 NOTE — Care Management (Signed)
Patient's blood pressure remains > 123XX123 diastolic.  He is insisting on discharging home.  He declines any need for assistance obtaining local PCP.  "I have a doctor now but can not remember the name.  He denies need for assistance with medications.   He has most of his meds already purchased from [facility] but did not take them.  Provided patient with good rx coupon for hydralazine.  His dose is now to be changed to tid.  He says he makes plenty of money and to please stop trying to help."  He is not homebound, independent in all his adls, denies issues with shelter or accessing medical care.  Says "i just have not been taking care of myself."

## 2016-04-18 NOTE — Progress Notes (Signed)
Pt returned from MRI, bp upon arrival is 170/125.  Pt given 50 mg hydralazine.  Dr. Benjie Karvonen reviewed MRI results.  Per Dr. Benjie Karvonen, give pt scheduled 1700 Coreg dose and give one time dose of clonidine, then pt may be discharged home.

## 2016-04-18 NOTE — Plan of Care (Signed)
Problem: Physical Regulation: Goal: Ability to maintain clinical measurements within normal limits will improve Outcome: Adequate for Discharge Remains hypertensive, but may discharge per Dr. Benjie Karvonen

## 2016-04-19 LAB — HEPATITIS C ANTIBODY

## 2016-04-21 LAB — ALDOSTERONE + RENIN ACTIVITY W/ RATIO
ALDO / PRA RATIO: 1.1 (ref 0.0–30.0)
Aldosterone: 8.8 ng/dL (ref 0.0–30.0)
PRA LC/MS/MS: 8.077 ng/mL/h — AB (ref 0.167–5.380)

## 2016-07-09 ENCOUNTER — Encounter: Payer: Self-pay | Admitting: Emergency Medicine

## 2016-07-09 ENCOUNTER — Inpatient Hospital Stay
Admission: EM | Admit: 2016-07-09 | Discharge: 2016-07-14 | DRG: 304 | Disposition: A | Discharge status: Home / Self Care | Payer: Commercial Managed Care - HMO | Attending: Internal Medicine | Admitting: Internal Medicine

## 2016-07-09 DIAGNOSIS — I161 Hypertensive emergency: Secondary | ICD-10-CM

## 2016-07-09 DIAGNOSIS — I10 Essential (primary) hypertension: Secondary | ICD-10-CM | POA: Diagnosis present

## 2016-07-09 DIAGNOSIS — R809 Proteinuria, unspecified: Secondary | ICD-10-CM | POA: Diagnosis present

## 2016-07-09 DIAGNOSIS — E876 Hypokalemia: Secondary | ICD-10-CM | POA: Diagnosis present

## 2016-07-09 DIAGNOSIS — Z9889 Other specified postprocedural states: Secondary | ICD-10-CM | POA: Diagnosis not present

## 2016-07-09 DIAGNOSIS — I248 Other forms of acute ischemic heart disease: Secondary | ICD-10-CM | POA: Diagnosis present

## 2016-07-09 DIAGNOSIS — Z9114 Patient's other noncompliance with medication regimen: Secondary | ICD-10-CM

## 2016-07-09 DIAGNOSIS — I169 Hypertensive crisis, unspecified: Secondary | ICD-10-CM | POA: Diagnosis present

## 2016-07-09 DIAGNOSIS — I160 Hypertensive urgency: Secondary | ICD-10-CM | POA: Diagnosis present

## 2016-07-09 DIAGNOSIS — N186 End stage renal disease: Secondary | ICD-10-CM | POA: Diagnosis present

## 2016-07-09 DIAGNOSIS — N189 Chronic kidney disease, unspecified: Secondary | ICD-10-CM

## 2016-07-09 DIAGNOSIS — N179 Acute kidney failure, unspecified: Secondary | ICD-10-CM | POA: Diagnosis present

## 2016-07-09 DIAGNOSIS — Z91128 Patient's intentional underdosing of medication regimen for other reason: Secondary | ICD-10-CM

## 2016-07-09 DIAGNOSIS — A150 Tuberculosis of lung: Secondary | ICD-10-CM

## 2016-07-09 DIAGNOSIS — H538 Other visual disturbances: Secondary | ICD-10-CM | POA: Diagnosis present

## 2016-07-09 DIAGNOSIS — Z79899 Other long term (current) drug therapy: Secondary | ICD-10-CM | POA: Diagnosis not present

## 2016-07-09 DIAGNOSIS — Z8249 Family history of ischemic heart disease and other diseases of the circulatory system: Secondary | ICD-10-CM

## 2016-07-09 DIAGNOSIS — Z8673 Personal history of transient ischemic attack (TIA), and cerebral infarction without residual deficits: Secondary | ICD-10-CM

## 2016-07-09 DIAGNOSIS — I120 Hypertensive chronic kidney disease with stage 5 chronic kidney disease or end stage renal disease: Secondary | ICD-10-CM | POA: Diagnosis present

## 2016-07-09 DIAGNOSIS — F17200 Nicotine dependence, unspecified, uncomplicated: Secondary | ICD-10-CM | POA: Diagnosis present

## 2016-07-09 DIAGNOSIS — E871 Hypo-osmolality and hyponatremia: Secondary | ICD-10-CM | POA: Diagnosis present

## 2016-07-09 DIAGNOSIS — Z992 Dependence on renal dialysis: Secondary | ICD-10-CM

## 2016-07-09 LAB — BASIC METABOLIC PANEL
Anion gap: 10 (ref 5–15)
BUN: 53 mg/dL — AB (ref 6–20)
CALCIUM: 9.1 mg/dL (ref 8.9–10.3)
CO2: 29 mmol/L (ref 22–32)
CREATININE: 6.01 mg/dL — AB (ref 0.61–1.24)
Chloride: 92 mmol/L — ABNORMAL LOW (ref 101–111)
GFR calc Af Amer: 13 mL/min — ABNORMAL LOW (ref >60)
GFR, EST NON AFRICAN AMERICAN: 11 mL/min — AB (ref >60)
GLUCOSE: 126 mg/dL — AB (ref 65–99)
Potassium: 2.3 mmol/L — CL (ref 3.5–5.1)
Sodium: 131 mmol/L — ABNORMAL LOW (ref 135–145)

## 2016-07-09 LAB — TROPONIN I: TROPONIN I: 0.1 ng/mL — AB (ref <0.03)

## 2016-07-09 LAB — CBC
HEMATOCRIT: 39.5 % — AB (ref 40.0–52.0)
Hemoglobin: 14 g/dL (ref 13.0–18.0)
MCH: 28.7 pg (ref 26.0–34.0)
MCHC: 35.3 g/dL (ref 32.0–36.0)
MCV: 81.3 fL (ref 80.0–100.0)
PLATELETS: 160 10*3/uL (ref 150–440)
RBC: 4.86 MIL/uL (ref 4.40–5.90)
RDW: 14.8 % — AB (ref 11.5–14.5)
WBC: 8.5 10*3/uL (ref 3.8–10.6)

## 2016-07-09 LAB — MAGNESIUM: MAGNESIUM: 1.9 mg/dL (ref 1.7–2.4)

## 2016-07-09 LAB — MRSA PCR SCREENING: MRSA BY PCR: NEGATIVE

## 2016-07-09 MED ORDER — ACETAMINOPHEN 650 MG RE SUPP: 650.0000 mg | Freq: Four times a day (QID) | RECTAL | Status: DC | PRN

## 2016-07-09 MED ORDER — ACETAMINOPHEN 325 MG PO TABS
650.0000 mg | ORAL_TABLET | Freq: Four times a day (QID) | ORAL | Status: DC | PRN
  Administered 2016-07-10: 650 mg via ORAL
  Filled 2016-07-09: qty 2

## 2016-07-09 MED ORDER — HEPARIN SODIUM (PORCINE) 5000 UNIT/ML IJ SOLN
5000.0000 [IU] | Freq: Three times a day (TID) | INTRAMUSCULAR | Status: DC
  Administered 2016-07-09 – 2016-07-14 (×11): 5000 [IU] via SUBCUTANEOUS
  Filled 2016-07-09 (×11): qty 1

## 2016-07-09 MED ORDER — AMLODIPINE BESYLATE 10 MG PO TABS
10.0000 mg | ORAL_TABLET | Freq: Every day | ORAL | Status: DC
  Administered 2016-07-09 – 2016-07-14 (×6): 10 mg via ORAL
  Filled 2016-07-09 (×7): qty 1

## 2016-07-09 MED ORDER — HYDRALAZINE HCL 50 MG PO TABS
50.0000 mg | ORAL_TABLET | Freq: Three times a day (TID) | ORAL | Status: DC
  Administered 2016-07-09 – 2016-07-13 (×11): 50 mg via ORAL
  Filled 2016-07-09 (×12): qty 1

## 2016-07-09 MED ORDER — ONDANSETRON HCL 4 MG PO TABS: 4.0000 mg | ORAL_TABLET | Freq: Four times a day (QID) | ORAL | Status: DC | PRN

## 2016-07-09 MED ORDER — SODIUM CHLORIDE 0.9% FLUSH
3.0000 mL | Freq: Two times a day (BID) | INTRAVENOUS | Status: DC
  Administered 2016-07-09 – 2016-07-14 (×9): 3 mL via INTRAVENOUS

## 2016-07-09 MED ORDER — CLONIDINE HCL 0.1 MG PO TABS
0.2000 mg | ORAL_TABLET | Freq: Two times a day (BID) | ORAL | Status: DC
  Administered 2016-07-09 – 2016-07-14 (×9): 0.2 mg via ORAL
  Filled 2016-07-09 (×9): qty 2

## 2016-07-09 MED ORDER — ONDANSETRON HCL 4 MG/2ML IJ SOLN
4.0000 mg | Freq: Four times a day (QID) | INTRAMUSCULAR | Status: DC | PRN
  Administered 2016-07-09: 4 mg via INTRAVENOUS
  Filled 2016-07-09: qty 2

## 2016-07-09 MED ORDER — MORPHINE SULFATE (PF) 2 MG/ML IV SOLN: 2.0000 mg | INTRAVENOUS | Status: DC | PRN

## 2016-07-09 MED ORDER — POTASSIUM CHLORIDE 10 MEQ/100ML IV SOLN
10.0000 meq | Freq: Once | INTRAVENOUS | Status: AC
  Administered 2016-07-09: 10 meq via INTRAVENOUS
  Filled 2016-07-09: qty 100

## 2016-07-09 MED ORDER — OXYCODONE HCL 5 MG PO TABS
5.0000 mg | ORAL_TABLET | ORAL | Status: DC | PRN
  Administered 2016-07-14: 5 mg via ORAL
  Filled 2016-07-09: qty 1

## 2016-07-09 MED ORDER — NICARDIPINE HCL IN NACL 20-0.86 MG/200ML-% IV SOLN
3.0000 mg/h | INTRAVENOUS | Status: DC
  Administered 2016-07-09: 5 mg/h via INTRAVENOUS
  Administered 2016-07-09: 8 mg/h via INTRAVENOUS
  Administered 2016-07-10: 3 mg/h via INTRAVENOUS
  Filled 2016-07-09 (×3): qty 200

## 2016-07-09 MED ORDER — CARVEDILOL 12.5 MG PO TABS
25.0000 mg | ORAL_TABLET | Freq: Two times a day (BID) | ORAL | Status: DC
  Administered 2016-07-10 – 2016-07-14 (×6): 25 mg via ORAL
  Filled 2016-07-09: qty 2
  Filled 2016-07-09: qty 1
  Filled 2016-07-09 (×6): qty 2

## 2016-07-09 NOTE — ED Provider Notes (Signed)
[facility] Emergency Department Provider Note  ____________________________________________   First MD Initiated Contact with Patient 07/09/16 1911     (approximate)  I have reviewed the triage vital signs and the nursing notes.   HISTORY  Chief Complaint Weakness    HPI Alan Gross is a 30 y.o. male with a history of uncontrolled hypertension, hypertensive emergencies, chronic renal failure, and medication noncompliance who presents for lightheadedness and dizziness.  He reports that he feels this way when his potassium gets too low.  He has been having nausea and vomiting recently as well which she also associates with his potassium being low (he believes the low potassium is causing her to vomit rather than vice versa).  He states that he is on multiple antihypertensives as prescribed by Dr. Juleen China, his nephrologist, but he only intermittently takes them.  He has not taken his clonidine and more than 2 days.  He is not certain about the other medications.  He denies chest pain but has had some shortness of breath.  Overall he describes his feelings of malaise and lightheadedness as severe.  Nothing is making it better or worse.   Past Medical History:  Diagnosis Date  . H/O cardiac catheterization   . Hypertension   . Seizures ([place])    when he was a child    Patient Active Problem List   Diagnosis Date Noted  . Hypertensive crisis 07/09/2016    Past Surgical History:  Procedure Laterality Date  . CARDIAC CATHETERIZATION Left 05/02/2015   Procedure: Left Heart Cath and Coronary Angiography;  Surgeon: Dionisio David, MD;  Location: [facility];  Service: Cardiovascular;  Laterality: Left;  . CORONARY ANGIOPLASTY      Prior to Admission medications   Medication Sig Start Date End Date Taking? Authorizing Provider  Acetaminophen (PAIN RELIEVER PO) Take 1 tablet by mouth.   Yes Historical Provider, MD  amLODipine (NORVASC) 10 MG tablet  Take 10 mg by mouth daily.   Yes Historical Provider, MD  carvedilol (COREG) 25 MG tablet Take 25 mg by mouth 2 (two) times daily with a meal.   Yes Historical Provider, MD  cloNIDine (CATAPRES) 0.2 MG tablet Take 1 tablet (0.2 mg total) by mouth 2 (two) times daily. 04/18/16  Yes Bettey Costa, MD  hydrALAZINE (APRESOLINE) 50 MG tablet Take 1 tablet (50 mg total) by mouth 3 (three) times daily. 04/18/16  Yes Bettey Costa, MD  potassium chloride (K-DUR) 10 MEQ tablet Take 1 tablet (10 mEq total) by mouth daily. 04/18/16  Yes Bettey Costa, MD    Allergies Review of patient's allergies indicates no known allergies.  Family History  Problem Relation Age of Onset  . Hypertension Other     Social History Social History  Substance Use Topics  . Smoking status: Current Every Day Smoker    Packs/day: 1.00    Years: 10.00    Types: Cigarettes  . Smokeless tobacco: Current User    Last attempt to quit: 05/02/2015  . Alcohol use 2.4 oz/week    4 Cans of beer per week     Comment: occas.     Review of Systems Constitutional: No fever/chills Eyes: No visual changes. ENT: No sore throat. Cardiovascular: Denies chest pain. Respiratory: Mild intermittent shortness of breath. Gastrointestinal: No abdominal pain.  +N/V.  No diarrhea.  No constipation. Genitourinary: Negative for dysuria. Musculoskeletal: Negative for back pain. Skin: Negative for rash. Neurological: Negative for headaches, focal weakness or numbness.  10-point  ROS otherwise negative.  ____________________________________________   PHYSICAL EXAM:  VITAL SIGNS: ED Triage Vitals  Enc Vitals Group     BP 07/09/16 1753 (!) 258/169     Pulse Rate 07/09/16 1750 96     Resp 07/09/16 1750 16     Temp 07/09/16 1750 98.6 F (37 C)     Temp Source 07/09/16 1750 Oral     SpO2 07/09/16 1750 100 %     Weight 07/09/16 1750 240 lb (108.9 kg)     Height 07/09/16 1750 6\' 1"  (1.854 m)     Head Circumference --      Peak Flow --      Pain  Score 07/09/16 1751 0     Pain Loc --      Pain Edu? --      Excl. in [place]? --     Constitutional: Alert and oriented. In no acute distress but the patient is diaphoretic and appears somewhat uncomfortable Eyes: Conjunctivae are normal. PERRL. EOMI. Head: Atraumatic. Nose: No congestion/rhinnorhea. Mouth/Throat: Mucous membranes are moist.  Oropharynx non-erythematous. Neck: No stridor.  No meningeal signs.   Cardiovascular: Normal rate, regular rhythm. Good peripheral circulation. Grossly normal heart sounds. Respiratory: Normal respiratory effort.  No retractions. Lungs CTAB. Gastrointestinal: Soft and nontender. No distention.  Musculoskeletal: No lower extremity tenderness nor edema. No gross deformities of extremities. Neurologic:  Normal speech and language. No gross focal neurologic deficits are appreciated.  Skin:  Skin is lightly diaphoretic, dry and intact. No rash noted. Psychiatric: Mood and affect are normal. Speech and behavior are normal.  ____________________________________________   LABS (all labs ordered are listed, but only abnormal results are displayed)  Labs Reviewed  BASIC METABOLIC PANEL - Abnormal; Notable for the following:       Result Value   Sodium 131 (*)    Potassium 2.3 (*)    Chloride 92 (*)    Glucose, Bld 126 (*)    BUN 53 (*)    Creatinine, Ser 6.01 (*)    GFR calc non Af Amer 11 (*)    GFR calc Af Amer 13 (*)    All other components within normal limits  CBC - Abnormal; Notable for the following:    HCT 39.5 (*)    RDW 14.8 (*)    All other components within normal limits  TROPONIN I - Abnormal; Notable for the following:    Troponin I 0.10 (*)    All other components within normal limits  MRSA PCR SCREENING  MAGNESIUM  BASIC METABOLIC PANEL  CBC   ____________________________________________  EKG  ED ECG REPORT I, Marcellis Frampton, the attending physician, personally viewed and interpreted this ECG.  Date: 07/09/2016 EKG Time:  17:58 Rate: 85 Rhythm: normal sinus rhythm QRS Axis: normal Intervals: normal ST/T Wave abnormalities: T wave inversions in leads V5 and V6.  Actually 1 mm of ST elevation in lead V3 most consistent with early repolarization. Conduction Disturbances: none Narrative Interpretation: Non-specific ST segment / T-wave changes, but no evidence of acute ischemia.  ____________________________________________  RADIOLOGY   No results found.  ____________________________________________   PROCEDURES  Procedure(s) performed:   .Critical Care Performed by: Hinda Kehr Authorized by: Hinda Kehr   Critical care provider statement:    Critical care time (minutes):  45   Critical care time was exclusive of:  Separately billable procedures and treating other patients   Critical care was necessary to treat or prevent imminent or life-threatening deterioration of the following conditions:  Renal failure, circulatory failure and CNS failure or compromise   Critical care was time spent personally by me on the following activities:  Development of treatment plan with patient or surrogate, discussions with consultants, evaluation of patient's response to treatment, examination of patient, obtaining history from patient or surrogate, ordering and performing treatments and interventions, ordering and review of laboratory studies, ordering and review of radiographic studies, pulse oximetry, re-evaluation of patient's condition and review of old charts      Critical Care performed: Yes, see critical care procedure note(s) ____________________________________________   INITIAL IMPRESSION / [place] / ED COURSE  Pertinent labs & imaging results that were available during my care of the patient were reviewed by me and considered in my medical decision making (see chart for details).  The patient is noncompliant with his medication regimen.  He has having signs and symptoms consistent  with multisystem organ failure secondary to hypertensive emergency.  He has no signs of encephalopathy at this point.  I spoke by phone with Dr. Juleen China and discussed the case.  He agreed with my plan to start a nicardipine drip and titrate to a systolic blood pressure approximately [id number] and a diastolic of [id number].  He agreed with admission to the ICU and stated that he would see the patient tomorrow likely start him on dialysis.  We will give only a small amount of potassium given that he is in acute on chronic renal failure we do not want him to become hyperkalemic.  I discussed this with the patient and we are not agreement with plan.  As per Dr. Juleen China we will not give clonidine since the patient has not had a dose for 2 days and we will simply titrate the nicardipine appropriately.  I discussed the patient in person with the hospitalist who will admit.    ____________________________________________  FINAL CLINICAL IMPRESSION(S) / ED DIAGNOSES  Final diagnoses:  Hypertensive emergency  Demand ischemia ([place])  Acute on chronic renal failure ([place])  Hypokalemia     MEDICATIONS GIVEN DURING THIS VISIT:  Medications  nicardipine (CARDENE) 20mg  in 0.86% saline 232ml IV infusion (0.1 mg/ml) (8 mg/hr Intravenous Rate/Dose Change 07/09/16 2200)  potassium chloride 10 mEq in 100 mL IVPB (0 mEq Intravenous Stopped 07/09/16 2059)     NEW OUTPATIENT MEDICATIONS STARTED DURING THIS VISIT:  Current Discharge Medication List        Note:  This document was prepared using Dragon voice recognition software and may include unintentional dictation errors.    Hinda Kehr, MD 07/09/16 [phone]

## 2016-07-09 NOTE — H&P (Signed)
[facility] NAME: Alan Gross    MR#:  [id number]  DATE OF BIRTH:  1986-06-26   DATE OF ADMISSION:  07/09/2016  PRIMARY CARE PHYSICIAN: No PCP Per Patient   REQUESTING/REFERRING PHYSICIAN: Karma Greaser  CHIEF COMPLAINT:   Chief Complaint  Patient presents with  . Weakness    HISTORY OF PRESENT ILLNESS:  Alan Gross  is a 30 y.o. male with a known history of Chronic kidney disease stage IV, essential hypertension who is presenting with weakness and headache. He states he has been noncompliant with his antihypertensives skipped most of them for about 2 weeks just ran out of clonidine about 2 days ago. Presents for the above symptoms noted to have markedly elevated blood pressure 260s/140s  PAST MEDICAL HISTORY:   Past Medical History:  Diagnosis Date  . H/O cardiac catheterization   . Hypertension   . Seizures ([place])    when he was a child    PAST SURGICAL HISTORY:   Past Surgical History:  Procedure Laterality Date  . CARDIAC CATHETERIZATION Left 05/02/2015   Procedure: Left Heart Cath and Coronary Angiography;  Surgeon: Dionisio David, MD;  Location: [facility];  Service: Cardiovascular;  Laterality: Left;  . CORONARY ANGIOPLASTY      SOCIAL HISTORY:   Social History  Substance Use Topics  . Smoking status: Current Every Day Smoker    Packs/day: 1.00    Years: 10.00    Types: Cigarettes  . Smokeless tobacco: Current User    Last attempt to quit: 05/02/2015  . Alcohol use 2.4 oz/week    4 Cans of beer per week     Comment: occas.     FAMILY HISTORY:   Family History  Problem Relation Age of Onset  . Hypertension Other     DRUG ALLERGIES:  No Known Allergies  REVIEW OF SYSTEMS:  REVIEW OF SYSTEMS:  CONSTITUTIONAL: Denies fevers, chills,Positive fatigue, weakness.  EYES: Denies blurred vision, double vision, or eye pain.  EARS, NOSE, THROAT: Denies tinnitus, ear pain, hearing loss.    RESPIRATORY: denies cough, shortness of breath, wheezing  CARDIOVASCULAR: Denies chest pain, palpitations, edema.  GASTROINTESTINAL: Denies nausea, vomiting, diarrhea, abdominal pain.  GENITOURINARY: Denies dysuria, hematuria.  ENDOCRINE: Denies nocturia or thyroid problems. HEMATOLOGIC AND LYMPHATIC: Denies easy bruising or bleeding.  SKIN: Denies rash or lesions.  MUSCULOSKELETAL: Denies pain in neck, back, shoulder, knees, hips, or further arthritic symptoms.  NEUROLOGIC: Denies paralysis, paresthesias.  PSYCHIATRIC: Denies anxiety or depressive symptoms. Otherwise full review of systems performed by me is negative.   MEDICATIONS AT HOME:   Prior to Admission medications   Medication Sig Start Date End Date Taking? Authorizing Provider  Acetaminophen (PAIN RELIEVER PO) Take 1 tablet by mouth.   Yes Historical Provider, MD  amLODipine (NORVASC) 10 MG tablet Take 10 mg by mouth daily.   Yes Historical Provider, MD  carvedilol (COREG) 25 MG tablet Take 25 mg by mouth 2 (two) times daily with a meal.   Yes Historical Provider, MD  cloNIDine (CATAPRES) 0.2 MG tablet Take 1 tablet (0.2 mg total) by mouth 2 (two) times daily. 04/18/16  Yes Bettey Costa, MD  hydrALAZINE (APRESOLINE) 50 MG tablet Take 1 tablet (50 mg total) by mouth 3 (three) times daily. 04/18/16  Yes Bettey Costa, MD  potassium chloride (K-DUR) 10 MEQ tablet Take 1 tablet (10 mEq total) by mouth daily. 04/18/16  Yes Bettey Costa, MD  VITAL SIGNS:  Blood pressure (!) 256/159, pulse 89, temperature 98.6 F (37 C), temperature source Oral, resp. rate 19, height 6\' 1"  (1.854 m), weight 108.9 kg (240 lb), SpO2 99 %.  PHYSICAL EXAMINATION:  VITAL SIGNS: Vitals:   07/09/16 1900 07/09/16 1930  BP: (!) 265/174 (!) 256/159  Pulse: 86 89  Resp: 18 19  Temp:     GENERAL:30 y.o.male currently in Mild acute distress. Given headache HEAD: Normocephalic, atraumatic.  EYES: Pupils equal, round, reactive to light. Extraocular muscles  intact. No scleral icterus.  MOUTH: Moist mucosal membrane. Dentition intact. No abscess noted.  EAR, NOSE, THROAT: Clear without exudates. No external lesions.  NECK: Supple. No thyromegaly. No nodules. No JVD.  PULMONARY: Clear to ascultation, without wheeze rails or rhonci. No use of accessory muscles, Good respiratory effort. good air entry bilaterally CHEST: Nontender to palpation.  CARDIOVASCULAR: S1 and S2. Regular rate and rhythm. No murmurs, rubs, or gallops. No edema. Pedal pulses 2+ bilaterally.  GASTROINTESTINAL: Soft, nontender, nondistended. No masses. Positive bowel sounds. No hepatosplenomegaly.  MUSCULOSKELETAL: No swelling, clubbing, or edema. Range of motion full in all extremities.  NEUROLOGIC: Cranial nerves II through XII are intact. No gross focal neurological deficits. Sensation intact. Reflexes intact.  SKIN: No ulceration, lesions, rashes, or cyanosis. Skin warm and dry. Turgor intact.  PSYCHIATRIC: Mood, affect within normal limits. The patient is awake, alert and oriented x 3. Insight, judgment intact.    LABORATORY PANEL:   CBC  Recent Labs Lab 07/09/16 1802  WBC 8.5  HGB 14.0  HCT 39.5*  PLT 160   ------------------------------------------------------------------------------------------------------------------  Chemistries   Recent Labs Lab 07/09/16 1802  NA 131*  K 2.3*  CL 92*  CO2 29  GLUCOSE 126*  BUN 53*  CREATININE 6.01*  CALCIUM 9.1  MG 1.9   ------------------------------------------------------------------------------------------------------------------  Cardiac Enzymes  Recent Labs Lab 07/09/16 1802  TROPONINI 0.10*   ------------------------------------------------------------------------------------------------------------------  RADIOLOGY:  No results found.  EKG:   Orders placed or performed during the hospital encounter of 07/09/16  . EKG 12-Lead  . EKG 12-Lead  . ED EKG within 10 minutes  . ED EKG within 10  minutes    IMPRESSION AND PLAN:   30 year old African-American gentleman history of poorly controlled hypertension, chronic kidney disease presenting with weakness after medication noncompliance  1. Hypertensive crisis: As indicated by headache and renal failure, started on a nicardipine drip -- Initial: 5 mg/hour; may increase by 2.5 mg/hour every 5 minutes (for rapid titration) to every 15 minutes (for gradual titration) up to a maximum of 15 mg/hour. Restart home medications 2. Acute on chronic renal failure: Case discussed with nephrology plans for potential dialysis starting tomorrow 3. Hypokalemia: Reported to receive some replacement in emergency department 4. Elevated troponin: Demand     All the records are reviewed and case discussed with ED provider. Management plans discussed with the patient, family and they are in agreement.  CODE STATUS: Full  TOTAL TIME TAKING CARE OF THIS PATIENT: 45 critical minutes.    Hower,  Karenann Cai.D on 07/09/2016 at 8:12 PM  Between 7am to 6pm - Pager - [phone]  After 6pm: House Pager: - [phone]  [facility]  Office  [phone]  CC: Primary care physician; No PCP Per Patient

## 2016-07-09 NOTE — ED Triage Notes (Signed)
Pt reports blurred vision, numbness in feet and feeling shaky. Pt reports last time he felt like this, his potassium was low.

## 2016-07-09 NOTE — ED Notes (Signed)
MD at bedside. 

## 2016-07-10 ENCOUNTER — Encounter
Admission: EM | Disposition: A | Discharge status: Home / Self Care | Payer: Self-pay | Source: Home / Self Care | Attending: Internal Medicine

## 2016-07-10 LAB — BASIC METABOLIC PANEL
ANION GAP: 11 (ref 5–15)
BUN: 49 mg/dL — ABNORMAL HIGH (ref 6–20)
CALCIUM: 8.6 mg/dL — AB (ref 8.9–10.3)
CHLORIDE: 96 mmol/L — AB (ref 101–111)
CO2: 28 mmol/L (ref 22–32)
Creatinine, Ser: 5.98 mg/dL — ABNORMAL HIGH (ref 0.61–1.24)
GFR calc Af Amer: 13 mL/min — ABNORMAL LOW (ref >60)
GFR calc non Af Amer: 11 mL/min — ABNORMAL LOW (ref >60)
GLUCOSE: 146 mg/dL — AB (ref 65–99)
POTASSIUM: 2.5 mmol/L — AB (ref 3.5–5.1)
Sodium: 135 mmol/L (ref 135–145)

## 2016-07-10 LAB — CBC
HEMATOCRIT: 38.9 % — AB (ref 40.0–52.0)
HEMOGLOBIN: 13.5 g/dL (ref 13.0–18.0)
MCH: 28.4 pg (ref 26.0–34.0)
MCHC: 34.7 g/dL (ref 32.0–36.0)
MCV: 81.9 fL (ref 80.0–100.0)
Platelets: 158 10*3/uL (ref 150–440)
RBC: 4.75 MIL/uL (ref 4.40–5.90)
RDW: 14.6 % — ABNORMAL HIGH (ref 11.5–14.5)
WBC: 8.4 10*3/uL (ref 3.8–10.6)

## 2016-07-10 LAB — POTASSIUM: Potassium: 3.1 mmol/L — ABNORMAL LOW (ref 3.5–5.1)

## 2016-07-10 LAB — GLUCOSE, CAPILLARY: Glucose-Capillary: 163 mg/dL — ABNORMAL HIGH (ref 65–99)

## 2016-07-10 SURGERY — DIALYSIS/PERMA CATHETER INSERTION
Anesthesia: Moderate Sedation

## 2016-07-10 MED ORDER — POTASSIUM CHLORIDE 10 MEQ/100ML IV SOLN
10.0000 meq | INTRAVENOUS | Status: AC
  Administered 2016-07-10 (×4): 10 meq via INTRAVENOUS
  Filled 2016-07-10 (×4): qty 100

## 2016-07-10 MED ORDER — CEFAZOLIN IN D5W 1 GM/50ML IV SOLN
1.0000 g | INTRAVENOUS | Status: AC
  Filled 2016-07-10: qty 50

## 2016-07-10 MED ORDER — MAGNESIUM SULFATE 2 GM/50ML IV SOLN
2.0000 g | Freq: Once | INTRAVENOUS | Status: AC
  Administered 2016-07-10: 2 g via INTRAVENOUS
  Filled 2016-07-10: qty 50

## 2016-07-10 MED ORDER — TUBERCULIN PPD 5 UNIT/0.1ML ID SOLN
5.0000 [IU] | Freq: Once | INTRADERMAL | Status: AC
  Administered 2016-07-10: 5 [IU] via INTRADERMAL
  Filled 2016-07-10 (×3): qty 0.1

## 2016-07-10 MED ORDER — POTASSIUM CHLORIDE CRYS ER 20 MEQ PO TBCR
20.0000 meq | EXTENDED_RELEASE_TABLET | Freq: Once | ORAL | Status: AC
  Administered 2016-07-10: 20 meq via ORAL
  Filled 2016-07-10: qty 1

## 2016-07-10 NOTE — Progress Notes (Signed)
Patient for permcath with Dr. Lucky Cowboy (Thursday) tomorrow.

## 2016-07-10 NOTE — Progress Notes (Signed)
[facility] PROGRESS NOTE  Alan Gross [id number] DOB: 08/10/1986 DOA: 07/09/2016 PCP: No PCP Per Patient  HPI/Subjective: Patient with blurry vision, weakness and leg numbness  Objective: Vitals:   07/10/16 0900 07/10/16 0939  BP: (!) 168/101 (!) 172/113  Pulse: 88   Resp: 18   Temp:      Filed Weights   07/09/16 1750 07/09/16 2200  Weight: 108.9 kg (240 lb) 108.4 kg (238 lb 15.7 oz)    ROS: Review of Systems  Constitutional: Negative for chills and fever.  Eyes: Positive for blurred vision.  Respiratory: Negative for cough and shortness of breath.   Cardiovascular: Negative for chest pain.  Gastrointestinal: Positive for nausea. Negative for abdominal pain, constipation, diarrhea and vomiting.  Genitourinary: Negative for dysuria.  Musculoskeletal: Negative for joint pain.  Neurological: Positive for sensory change and weakness. Negative for dizziness and headaches.   Exam: Physical Exam  Constitutional: He is oriented to person, place, and time.  HENT:  Nose: No mucosal edema.  Mouth/Throat: No oropharyngeal exudate or posterior oropharyngeal edema.  Eyes: Conjunctivae, EOM and lids are normal. Pupils are equal, round, and reactive to light.  Neck: No JVD present. Carotid bruit is not present. No edema present. No thyroid mass and no thyromegaly present.  Cardiovascular: Regular rhythm, S1 normal and S2 normal.  Exam reveals no gallop.   Murmur heard.  Systolic murmur is present with a grade of 2/6  Pulses:      Dorsalis pedis pulses are 2+ on the right side, and 2+ on the left side.  Respiratory: No respiratory distress. He has no wheezes. He has no rhonchi. He has no rales.  GI: Soft. Bowel sounds are normal. There is tenderness in the epigastric area.  Musculoskeletal:       Right ankle: He exhibits no swelling.       Left ankle: He exhibits no swelling.  Lymphadenopathy:    He has no cervical adenopathy.  Neurological: He is alert and oriented to  person, place, and time. No cranial nerve deficit.  Skin: Skin is warm. No rash noted. Nails show no clubbing.  Psychiatric: He has a normal mood and affect.      Data Reviewed: Basic Metabolic Panel:  Recent Labs Lab 07/09/16 1802 07/10/16 0441  NA 131* 135  K 2.3* 2.5*  CL 92* 96*  CO2 29 28  GLUCOSE 126* 146*  BUN 53* 49*  CREATININE 6.01* 5.98*  CALCIUM 9.1 8.6*  MG 1.9  --    CBC:  Recent Labs Lab 07/09/16 1802 07/10/16 0441  WBC 8.5 8.4  HGB 14.0 13.5  HCT 39.5* 38.9*  MCV 81.3 81.9  PLT 160 158   Cardiac Enzymes:  Recent Labs Lab 07/09/16 1802  TROPONINI 0.10*    CBG:  Recent Labs Lab 07/09/16 2156  GLUCAP 163*    Recent Results (from the past 240 hour(s))  MRSA PCR Screening     Status: None   Collection Time: 07/09/16 10:01 PM  Result Value Ref Range Status   MRSA by PCR NEGATIVE NEGATIVE Final    Comment:        The GeneXpert MRSA Assay (FDA approved for NASAL specimens only), is one component of a comprehensive MRSA colonization surveillance program. It is not intended to diagnose MRSA infection nor to guide or monitor treatment for MRSA infections.       Scheduled Meds: . amLODipine  10 mg Oral Daily  . carvedilol  25 mg Oral BID WC  .  cloNIDine  0.2 mg Oral BID  . heparin  5,000 Units Subcutaneous Q8H  . hydrALAZINE  50 mg Oral TID  . magnesium sulfate 1 - 4 g bolus IVPB  2 g Intravenous Once  . potassium chloride  10 mEq Intravenous Q1 Hr x 4  . sodium chloride flush  3 mL Intravenous Q12H    Assessment/Plan:  1. Hypertensive urgency. BP now were he normally is as outpatient. Off nicardipine drip. Restart normal medications and titrate slowly 2. Severe hypokalemia. IV replacement. 3. Hypomagnesemia- replace iv also 4. Acute Kidney injury on CKD stage4. Nephrology talking to the patient about dialysis.  This would also help blood pressure 5. Elevated troponin. Demand ischemia secondary to hypertensive  urgency. 6. Nausea and vomiting- resolved  Code Status:     Code Status Orders        Start     Ordered   07/09/16 1951  Full code  Continuous     07/09/16 1950    Code Status History    Date Active Date Inactive Code Status Order ID Comments User Context   04/16/2016 10:51 PM 04/18/2016  8:14 PM Full Code [id number]  Quintella Baton, MD Inpatient   05/02/2015  7:22 PM 05/04/2015  2:49 PM Full Code [id number]  Demetrios Loll, MD Inpatient   05/02/2015  3:32 PM 05/02/2015  7:22 PM Full Code [id number]  Dionisio David, MD ED      Disposition Plan: BP will need to be better prior to disposition  Consultants: Nephrology  Time spent: 69minutes, case discussed with Nephrology  [facility]

## 2016-07-10 NOTE — Progress Notes (Signed)
[facility]  ROUNDING NOTE   Subjective:   Patient admitted with hypertensive urgency. Blood pressure has not been controlled and he has not been very compliant with his medications. Patient with numbness, nausea and vomiting.   Potassium critically low.  Admitted to ICU and placed on nicardipine gtt.  Restarted on home blood pressure regimen.   Objective:  Vital signs in last 24 hours:  Temp:  [98.3 F (36.8 C)-98.6 F (37 C)] 98.4 F (36.9 C) (08/23 0718) Pulse Rate:  [85-136] 88 (08/23 0900) Resp:  [14-29] 18 (08/23 0900) BP: (161-265)/(101-174) 172/113 (08/23 0939) SpO2:  [97 %-100 %] 98 % (08/23 0900) Weight:  [108.4 kg (238 lb 15.7 oz)-108.9 kg (240 lb)] 108.4 kg (238 lb 15.7 oz) (08/22 2200)  Weight change:  Filed Weights   07/09/16 1750 07/09/16 2200  Weight: 108.9 kg (240 lb) 108.4 kg (238 lb 15.7 oz)    Intake/Output: I/O last 3 completed shifts: In: 236 [P.O.:236] Out: 1300 [Urine:1300]   Intake/Output this shift:  Total I/O In: 404.5 [P.O.:340; I.V.:64.5] Out: 800 [Urine:800]  Physical Exam: General: NAD,   Head: Normocephalic, atraumatic. Moist oral mucosal membranes  Eyes: Anicteric, PERRL  Neck: Supple, trachea midline  Lungs:  Clear to auscultation  Heart: Regular rate and rhythm  Abdomen:  Soft, nontender,   Extremities: no peripheral edema.  Neurologic: Nonfocal, moving all four extremities  Skin: No lesions  Access: none    Basic Metabolic Panel:  Recent Labs Lab 07/09/16 1802 07/10/16 0441  NA 131* 135  K 2.3* 2.5*  CL 92* 96*  CO2 29 28  GLUCOSE 126* 146*  BUN 53* 49*  CREATININE 6.01* 5.98*  CALCIUM 9.1 8.6*  MG 1.9  --     Liver Function Tests: No results for input(s): AST, ALT, ALKPHOS, BILITOT, PROT, ALBUMIN in the last 168 hours. No results for input(s): LIPASE, AMYLASE in the last 168 hours. No results for input(s): AMMONIA in the last 168 hours.  CBC:  Recent Labs Lab 07/09/16 1802 07/10/16 0441   WBC 8.5 8.4  HGB 14.0 13.5  HCT 39.5* 38.9*  MCV 81.3 81.9  PLT 160 158    Cardiac Enzymes:  Recent Labs Lab 07/09/16 1802  TROPONINI 0.10*    BNP: Invalid input(s): POCBNP  CBG:  Recent Labs Lab 07/09/16 2156  GLUCAP 163*    Microbiology: Results for orders placed or performed during the hospital encounter of 07/09/16  MRSA PCR Screening     Status: None   Collection Time: 07/09/16 10:01 PM  Result Value Ref Range Status   MRSA by PCR NEGATIVE NEGATIVE Final    Comment:        The GeneXpert MRSA Assay (FDA approved for NASAL specimens only), is one component of a comprehensive MRSA colonization surveillance program. It is not intended to diagnose MRSA infection nor to guide or monitor treatment for MRSA infections.     Coagulation Studies: No results for input(s): LABPROT, INR in the last 72 hours.  Urinalysis: No results for input(s): COLORURINE, LABSPEC, PHURINE, GLUCOSEU, HGBUR, BILIRUBINUR, KETONESUR, PROTEINUR, UROBILINOGEN, NITRITE, LEUKOCYTESUR in the last 72 hours.  Invalid input(s): APPERANCEUR    Imaging: No results found.   Medications:     . amLODipine  10 mg Oral Daily  . carvedilol  25 mg Oral BID WC  . cloNIDine  0.2 mg Oral BID  . heparin  5,000 Units Subcutaneous Q8H  . hydrALAZINE  50 mg Oral TID  . magnesium sulfate 1 -  4 g bolus IVPB  2 g Intravenous Once  . potassium chloride  10 mEq Intravenous Q1 Hr x 4  . sodium chloride flush  3 mL Intravenous Q12H   acetaminophen **OR** acetaminophen, morphine injection, ondansetron **OR** ondansetron (ZOFRAN) IV, oxyCODONE  Assessment/ Plan:  Mr. Leodis Dileonardo is a 30 y.o. black male with hypertension, childhood seizures, TIA who presents for Hypokalemia [E87.6] Demand ischemia ([place]) [I24.8] Acute on chronic renal failure ([place]) [N17.9, N18.9] Hypertensive emergency [I16.1]  1. 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 At this time, Recommend starting hemodialysis at this time to assist with blood pressure control and uremic symptoms.  - Consult vascular surgery for dialysis catheter.  - Start dialysis today. Outpatient planning: [place] Will need tunneled catheter before discharge.  - PPD - hepatitis screen  2. Hypertension with urgency: now off nifedipine gtt. Home medications of furosemide 40mg  daily, clonidine 0.2mg  bid, carvedilol 25mg  bid, amlodipine 10mg  daily, and hydralazine 50mg  tid. - Restarted on amlodipine, carvedilol, clonidine and hydralazine.  - Hold spironolactone due to renal failure. - Hold furosemide due to hypokalemia and renal failure.  - Restart losartan 100mg  daily  3. Hypokalemia: renal potassium wasting? From hypermineralcortisolism? magnesium at goal.  - Continue potassium replacement.   4. Hyponatremia: sodium improved, secondary to renal failure.      LOS: 1 Camillo Quadros 8/23/201711:05 AM

## 2016-07-10 NOTE — Care Management (Addendum)
Spoke with Dr Juleen China.  PPD order present.  Hepatitis antibodies and antigen have been ordered.  At present, it has not been documented that patient is ESRD but nephrology has verbally stated that patient does have esrd.  Heads up referral faxed to [facility],  as requested by nephrologist.  Patient to have temporary cath placed and start dialysis today.

## 2016-07-10 NOTE — Progress Notes (Signed)
eLink Physician-Brief Progress Note Patient Name: Alan Gross DOB: 1986-05-30 MRN: [id number]   Date of Service  07/10/2016  HPI/Events of Note  Making urine, K 2.5  eICU Interventions  Hypokalemia -repleted Will need recheck - defer to rounding MD     Intervention Category Intermediate Interventions: Electrolyte abnormality - evaluation and management  Takiesha Mcdevitt V. 07/10/2016, 5:31 AM

## 2016-07-10 NOTE — Progress Notes (Signed)
Dr. Bunnie Domino PA Joelene Millin) notified that Dr. Juleen China is okay with temporary dialysis catheter, but would like to have permanent by the end of week.  Wilnette Kales

## 2016-07-10 NOTE — Care Management (Signed)
Met with patient to discuss discharge planning. He states he lives with his brother at [phone] zip code area. He states prior to this admission he "has been independent until his Potassium got out of order". He states he has health insurance and transportation to appointments but doesn't have a PCP. He asked that I find a PCP that will take his insurance and close to [place]. I have contacted [facility] [phone] and they will accept patient "but he has to call them". I have placed this clinics information on patient's discharge note. Patient is new dialysis and per nephro it will be [place]. [phone]. Patient has not started at the time of this note. I have asked nephro to add PPD and labs needed to establish outpatient dialysis- he agreed.  I have notified Mia [phone] fax [phone] of this need and she has passed this on to [facility] [phone]. Apparently they do not have access to [place] is going to call [facility] to get access. RNCM may need to physically fax labs/notes to establish outpatient care. RNCM will need to continue to follow. Patient will need to go to Social security office to find out how to apply for Medicare/disability.

## 2016-07-11 ENCOUNTER — Encounter
Admission: EM | Disposition: A | Discharge status: Home / Self Care | Payer: Self-pay | Source: Home / Self Care | Attending: Internal Medicine

## 2016-07-11 HISTORY — PX: PERIPHERAL VASCULAR CATHETERIZATION: SHX172C

## 2016-07-11 LAB — BASIC METABOLIC PANEL
Anion gap: 10 (ref 5–15)
BUN: 52 mg/dL — AB (ref 6–20)
CALCIUM: 8.5 mg/dL — AB (ref 8.9–10.3)
CHLORIDE: 97 mmol/L — AB (ref 101–111)
CO2: 28 mmol/L (ref 22–32)
CREATININE: 5.99 mg/dL — AB (ref 0.61–1.24)
GFR calc non Af Amer: 11 mL/min — ABNORMAL LOW (ref >60)
GFR, EST AFRICAN AMERICAN: 13 mL/min — AB (ref >60)
Glucose, Bld: 104 mg/dL — ABNORMAL HIGH (ref 65–99)
Potassium: 2.8 mmol/L — ABNORMAL LOW (ref 3.5–5.1)
SODIUM: 135 mmol/L (ref 135–145)

## 2016-07-11 LAB — HEPATITIS B SURFACE ANTIBODY,QUALITATIVE: HEP B S AB: NONREACTIVE

## 2016-07-11 LAB — HEPATITIS B SURFACE ANTIGEN: HEP B S AG: NEGATIVE

## 2016-07-11 LAB — HEPATITIS B CORE ANTIBODY, IGM: HEP B C IGM: NEGATIVE

## 2016-07-11 LAB — MAGNESIUM: Magnesium: 2.2 mg/dL (ref 1.7–2.4)

## 2016-07-11 LAB — HEPATITIS C ANTIBODY: HCV Ab: <0.1 s/co ratio (ref 0.0–0.9)

## 2016-07-11 SURGERY — DIALYSIS/PERMA CATHETER INSERTION
Anesthesia: Moderate Sedation

## 2016-07-11 MED ORDER — HEPARIN SODIUM (PORCINE) 10000 UNIT/ML IJ SOLN
INTRAMUSCULAR | Status: AC
  Filled 2016-07-11: qty 1

## 2016-07-11 MED ORDER — LABETALOL HCL 5 MG/ML IV SOLN
INTRAVENOUS | Status: AC
  Administered 2016-07-11: 20 mg
  Filled 2016-07-11: qty 4

## 2016-07-11 MED ORDER — POTASSIUM CHLORIDE CRYS ER 20 MEQ PO TBCR
40.0000 meq | EXTENDED_RELEASE_TABLET | Freq: Once | ORAL | Status: AC
  Administered 2016-07-11: 40 meq via ORAL
  Filled 2016-07-11: qty 2

## 2016-07-11 MED ORDER — MIDAZOLAM HCL 5 MG/5ML IJ SOLN
INTRAMUSCULAR | Status: AC
  Filled 2016-07-11: qty 5

## 2016-07-11 MED ORDER — LOSARTAN POTASSIUM 50 MG PO TABS
100.0000 mg | ORAL_TABLET | Freq: Every day | ORAL | Status: DC
  Administered 2016-07-11 – 2016-07-14 (×4): 100 mg via ORAL
  Filled 2016-07-11 (×4): qty 2

## 2016-07-11 MED ORDER — POTASSIUM CHLORIDE 10 MEQ/100ML IV SOLN
10.0000 meq | INTRAVENOUS | Status: AC
  Administered 2016-07-11: 10 meq via INTRAVENOUS
  Filled 2016-07-11 (×3): qty 100

## 2016-07-11 MED ORDER — LABETALOL HCL 5 MG/ML IV SOLN: 20.0000 mg | Freq: Once | INTRAVENOUS | Status: DC

## 2016-07-11 MED ORDER — HEPARIN (PORCINE) IN NACL 2-0.9 UNIT/ML-% IJ SOLN
INTRAMUSCULAR | Status: AC
  Filled 2016-07-11: qty 500

## 2016-07-11 MED ORDER — FENTANYL CITRATE (PF) 100 MCG/2ML IJ SOLN
INTRAMUSCULAR | Status: DC | PRN
  Administered 2016-07-11: 50 ug via INTRAVENOUS

## 2016-07-11 MED ORDER — MIDAZOLAM HCL 2 MG/2ML IJ SOLN
INTRAMUSCULAR | Status: DC | PRN
  Administered 2016-07-11: 2 mg via INTRAVENOUS

## 2016-07-11 MED ORDER — FENTANYL CITRATE (PF) 100 MCG/2ML IJ SOLN
INTRAMUSCULAR | Status: AC
  Filled 2016-07-11: qty 2

## 2016-07-11 MED ORDER — LIDOCAINE-EPINEPHRINE (PF) 1 %-1:200000 IJ SOLN
INTRAMUSCULAR | Status: AC
  Filled 2016-07-11: qty 30

## 2016-07-11 SURGICAL SUPPLY — 3 items
CATH PALINDROME RT-P 15FX19CM (CATHETERS) ×3 IMPLANT
PACK ANGIOGRAPHY (CUSTOM PROCEDURE TRAY) ×3 IMPLANT
TOWEL OR 17X26 4PK STRL BLUE (TOWEL DISPOSABLE) ×3 IMPLANT

## 2016-07-11 NOTE — Progress Notes (Signed)
Notified Dr. Marcille Blanco of K 2.8.

## 2016-07-11 NOTE — Progress Notes (Signed)
To dialysis via bed.

## 2016-07-11 NOTE — Progress Notes (Signed)
Back from specials, double lumen dialysis catheter rt chest wall.  Dressing dry and intact.  BP remains elevated. MD aware.

## 2016-07-11 NOTE — H&P (Signed)
  [facility] History & Physical Update  The patient was interviewed and re-examined.  The patient's previous History and Physical has been reviewed and is unchanged.  There is no change in the plan of care. We plan to proceed with the scheduled procedure.  Jalene Lacko, MD  07/11/2016, 9:46 AM

## 2016-07-11 NOTE — Progress Notes (Signed)
Dialysis complete

## 2016-07-11 NOTE — Progress Notes (Signed)
Pre Dialysis 

## 2016-07-11 NOTE — Progress Notes (Signed)
Patient ID: Alan Gross, male   DOB: 1986/09/23, 30 y.o.   MRN: [id number]  [facility] PROGRESS NOTE  Alan Gross [id number] DOB: Mar 24, 1986 DOA: 07/09/2016 PCP: No PCP Per Patient  HPI/Subjective: Patient seen down on dialysis. He is feeling tired. Blood pressure is been high but medications were not given and this morning. He did receive a couple medications at dialysis but not the clonidine. No further numbness. No further blurred vision. Patient asking when he can eat.  Objective: Vitals:   07/11/16 1245 07/11/16 1300  BP: (!) 176/119 (!) 173/129  Pulse: 64 64  Resp: 18 20  Temp:      Filed Weights   07/09/16 2200 07/10/16 1512 07/11/16 1155  Weight: 108.4 kg (238 lb 15.7 oz) 110.5 kg (243 lb 8 oz) 108.8 kg (239 lb 13.8 oz)    ROS: Review of Systems  Constitutional: Negative for chills and fever.  Eyes: Negative for blurred vision.  Respiratory: Negative for cough and shortness of breath.   Cardiovascular: Negative for chest pain.  Gastrointestinal: Negative for abdominal pain, constipation, diarrhea, nausea and vomiting.  Genitourinary: Negative for dysuria.  Musculoskeletal: Negative for joint pain.  Neurological: Positive for weakness. Negative for dizziness, sensory change and headaches.   Exam: Physical Exam  Constitutional: He is oriented to person, place, and time.  HENT:  Nose: No mucosal edema.  Mouth/Throat: No oropharyngeal exudate or posterior oropharyngeal edema.  Eyes: Conjunctivae, EOM and lids are normal. Pupils are equal, round, and reactive to light.  Neck: No JVD present. Carotid bruit is not present. No edema present. No thyroid mass and no thyromegaly present.  Cardiovascular: Regular rhythm, S1 normal and S2 normal.  Exam reveals no gallop.   Murmur heard.  Systolic murmur is present with a grade of 2/6  Pulses:      Dorsalis pedis pulses are 2+ on the right side, and 2+ on the left side.  Respiratory: No respiratory distress. He  has no wheezes. He has no rhonchi. He has no rales.  GI: Soft. Bowel sounds are normal. There is tenderness in the epigastric area.  Musculoskeletal:       Right ankle: He exhibits no swelling.       Left ankle: He exhibits no swelling.  Lymphadenopathy:    He has no cervical adenopathy.  Neurological: He is alert and oriented to person, place, and time. No cranial nerve deficit.  Skin: Skin is warm. No rash noted. Nails show no clubbing.  Psychiatric: He has a normal mood and affect.      Data Reviewed: Basic Metabolic Panel:  Recent Labs Lab 07/09/16 1802 07/10/16 0441 07/10/16 1235 07/11/16 0416  NA 131* 135  --  135  K 2.3* 2.5* 3.1* 2.8*  CL 92* 96*  --  97*  CO2 29 28  --  28  GLUCOSE 126* 146*  --  104*  BUN 53* 49*  --  52*  CREATININE 6.01* 5.98*  --  5.99*  CALCIUM 9.1 8.6*  --  8.5*  MG 1.9  --   --  2.2   CBC:  Recent Labs Lab 07/09/16 1802 07/10/16 0441  WBC 8.5 8.4  HGB 14.0 13.5  HCT 39.5* 38.9*  MCV 81.3 81.9  PLT 160 158   Cardiac Enzymes:  Recent Labs Lab 07/09/16 1802  TROPONINI 0.10*    CBG:  Recent Labs Lab 07/09/16 2156  GLUCAP 163*    Recent Results (from the past 240 hour(s))  MRSA PCR Screening  Status: None   Collection Time: 07/09/16 10:01 PM  Result Value Ref Range Status   MRSA by PCR NEGATIVE NEGATIVE Final    Comment:        The GeneXpert MRSA Assay (FDA approved for NASAL specimens only), is one component of a comprehensive MRSA colonization surveillance program. It is not intended to diagnose MRSA infection nor to guide or monitor treatment for MRSA infections.       Scheduled Meds: . amLODipine  10 mg Oral Daily  . carvedilol  25 mg Oral BID WC  .  ceFAZolin (ANCEF) IV  1 g Intravenous On Call  . cloNIDine  0.2 mg Oral BID  . heparin  5,000 Units Subcutaneous Q8H  . hydrALAZINE  50 mg Oral TID  . labetalol  20 mg Intravenous Once  . sodium chloride flush  3 mL Intravenous Q12H  . tuberculin   5 Units Intradermal Once    Assessment/Plan:  1. Hypertensive urgency. Restarted normal medications and titrate slowly. Nephrology to add losartan today. Potentially can go up on hydralazine tomorrow. Blood pressure medications as per nephrology should be given in the morning of dialysis also. 2. Severe hypokalemia. IV and oral replacement 3. Hypomagnesemia- replaced yesterday 4. Acute Kidney injury likely now end-stage renal disease. Nephrology had PermCath placed and started dialysis today. 5. Elevated troponin. Demand ischemia secondary to hypertensive urgency. 6. Nausea and vomiting- resolved  Code Status:     Code Status Orders        Start     Ordered   07/09/16 1951  Full code  Continuous     07/09/16 1950    Code Status History    Date Active Date Inactive Code Status Order ID Comments User Context   04/16/2016 10:51 PM 04/18/2016  8:14 PM Full Code [id number]  Quintella Baton, MD Inpatient   05/02/2015  7:22 PM 05/04/2015  2:49 PM Full Code [id number]  Demetrios Loll, MD Inpatient   05/02/2015  3:32 PM 05/02/2015  7:22 PM Full Code [id number]  Dionisio David, MD ED      Disposition Plan: BP will need to be better prior to disposition  Consultants: Nephrology  Time spent: 24 minutes, case discussed with Nephrology  [place], [facility]

## 2016-07-11 NOTE — Progress Notes (Signed)
Post dialysis 

## 2016-07-11 NOTE — Op Note (Signed)
OPERATIVE NOTE    PRE-OPERATIVE DIAGNOSIS: 1. ESRD 2. Hypertension  POST-OPERATIVE DIAGNOSIS: same as above  PROCEDURE: 1. Ultrasound guidance for vascular access to the right internal jugular vein 2. Fluoroscopic guidance for placement of catheter 3. Placement of a 19 cm tip to cuff tunneled hemodialysis catheter via the right internal jugular vein  SURGEON: Leotis Pain, MD  ANESTHESIA:  Local with Moderate conscious sedation for approximately 15 minutes using 2 mg of Versed and 50 mcg of Fentanyl  ESTIMATED BLOOD LOSS: 20 cc  FLUORO TIME: 0.3 minutes  CONTRAST: 0 cc  FINDING(S): 1.  Patent right internal jugular vein  SPECIMEN(S):  None  INDICATIONS:   Alan Gross is a 30 y.o. male who presents with end-stage renal disease.  The patient needs long term dialysis access for their ESRD, and a Permcath is necessary.  Risks and benefits are discussed and informed consent is obtained.    DESCRIPTION: After obtaining full informed written consent, the patient was brought back to the vascular suited. The patient's right neck and chest were sterilely prepped and draped in a sterile surgical field was created. Moderate conscious sedation was administered during a face to face encounter with the patient throughout the procedure with my supervision of the RN administering medicines and monitoring the patient's vital signs, pulse oximetry, telemetry and mental status throughout from the start of the procedure until the patient was taken to the recovery room.  The right internal jugular vein was visualized with ultrasound and found to be patent. It was then accessed under direct ultrasound guidance and a permanent image was recorded. A wire was placed. After skin nick and dilatation, the peel-away sheath was placed over the wire. I then turned my attention to an area under the clavicle. Approximately 1-2 fingerbreadths below the clavicle a small counterincision was created and tunneled from  the subclavicular incision to the access site. Using fluoroscopic guidance, a 19 centimeter tip to cuff tunneled hemodialysis catheter was selected, and tunneled from the subclavicular incision to the access site. It was then placed through the peel-away sheath and the peel-away sheath was removed. Using fluoroscopic guidance the catheter tips were parked in the right atrium. The appropriate distal connectors were placed. It withdrew blood well and flushed easily with heparinized saline and a concentrated heparin solution was then placed. It was secured to the chest wall with 2 Prolene sutures. The access incision was closed single 4-0 Monocryl. A 4-0 Monocryl pursestring suture was placed around the exit site. Sterile dressings were placed. The patient tolerated the procedure well and was taken to the recovery room in stable condition.  COMPLICATIONS: None  CONDITION: Stable  [name],[name]  07/11/2016, 10:11 AM

## 2016-07-11 NOTE — Progress Notes (Signed)
Notified Dr. Marcille Blanco of BP of 175/101.

## 2016-07-11 NOTE — Progress Notes (Signed)
[facility]  ROUNDING NOTE   Subjective:   Seen and examined on first hemodialysis treatment. Tolerating treatment well.  Blood pressure still elevated. Did not get all his medications, just amlodipine and carvedilol.   Objective:  Vital signs in last 24 hours:  Temp:  [97.7 F (36.5 C)-98.5 F (36.9 C)] 98.1 F (36.7 C) (08/24 1155) Pulse Rate:  [63-82] 64 (08/24 1300) Resp:  [16-24] 20 (08/24 1300) BP: (155-213)/(101-148) 173/129 (08/24 1300) SpO2:  [95 %-100 %] 100 % (08/24 1300) Weight:  [108.8 kg (239 lb 13.8 oz)-110.5 kg (243 lb 8 oz)] 108.8 kg (239 lb 13.8 oz) (08/24 1155)  Weight change: 1.588 kg (3 lb 8 oz) Filed Weights   07/09/16 2200 07/10/16 1512 07/11/16 1155  Weight: 108.4 kg (238 lb 15.7 oz) 110.5 kg (243 lb 8 oz) 108.8 kg (239 lb 13.8 oz)    Intake/Output: I/O last 3 completed shifts: In: 1330.5 [P.O.:816; I.V.:64.5; IV Piggyback:450] Out: [id number] [Urine:3925]   Intake/Output this shift:  Total I/O In: 3 [I.V.:3] Out: 1000 [Urine:1000]  Physical Exam: General: NAD,   Head: Normocephalic, atraumatic. Moist oral mucosal membranes  Eyes: Anicteric, PERRL  Neck: Supple, trachea midline  Lungs:  Clear to auscultation  Heart: Regular rate and rhythm  Abdomen:  Soft, nontender,   Extremities: no peripheral edema.  Neurologic: Nonfocal, moving all four extremities  Skin: No lesions  Access: RIJ permcath, Dr. Lucky Cowboy [id number]    Basic Metabolic Panel:  Recent Labs Lab 07/09/16 1802 07/10/16 0441 07/10/16 1235 07/11/16 0416  NA 131* 135  --  135  K 2.3* 2.5* 3.1* 2.8*  CL 92* 96*  --  97*  CO2 29 28  --  28  GLUCOSE 126* 146*  --  104*  BUN 53* 49*  --  52*  CREATININE 6.01* 5.98*  --  5.99*  CALCIUM 9.1 8.6*  --  8.5*  MG 1.9  --   --  2.2    Liver Function Tests: No results for input(s): AST, ALT, ALKPHOS, BILITOT, PROT, ALBUMIN in the last 168 hours. No results for input(s): LIPASE, AMYLASE in the last 168 hours. No results for  input(s): AMMONIA in the last 168 hours.  CBC:  Recent Labs Lab 07/09/16 1802 07/10/16 0441  WBC 8.5 8.4  HGB 14.0 13.5  HCT 39.5* 38.9*  MCV 81.3 81.9  PLT 160 158    Cardiac Enzymes:  Recent Labs Lab 07/09/16 1802  TROPONINI 0.10*    BNP: Invalid input(s): POCBNP  CBG:  Recent Labs Lab 07/09/16 2156  GLUCAP 163*    Microbiology: Results for orders placed or performed during the hospital encounter of 07/09/16  MRSA PCR Screening     Status: None   Collection Time: 07/09/16 10:01 PM  Result Value Ref Range Status   MRSA by PCR NEGATIVE NEGATIVE Final    Comment:        The GeneXpert MRSA Assay (FDA approved for NASAL specimens only), is one component of a comprehensive MRSA colonization surveillance program. It is not intended to diagnose MRSA infection nor to guide or monitor treatment for MRSA infections.     Coagulation Studies: No results for input(s): LABPROT, INR in the last 72 hours.  Urinalysis: No results for input(s): COLORURINE, LABSPEC, PHURINE, GLUCOSEU, HGBUR, BILIRUBINUR, KETONESUR, PROTEINUR, UROBILINOGEN, NITRITE, LEUKOCYTESUR in the last 72 hours.  Invalid input(s): APPERANCEUR    Imaging: No results found.   Medications:     . amLODipine  10 mg Oral Daily  .  carvedilol  25 mg Oral BID WC  .  ceFAZolin (ANCEF) IV  1 g Intravenous On Call  . cloNIDine  0.2 mg Oral BID  . heparin  5,000 Units Subcutaneous Q8H  . hydrALAZINE  50 mg Oral TID  . labetalol  20 mg Intravenous Once  . sodium chloride flush  3 mL Intravenous Q12H  . tuberculin  5 Units Intradermal Once   acetaminophen **OR** acetaminophen, morphine injection, ondansetron **OR** ondansetron (ZOFRAN) IV, oxyCODONE  Assessment/ Plan:  [name] is a 30 y.o. black male with hypertension, childhood seizures, TIA who presents for Hypokalemia [E87.6] Demand ischemia ([place]) [I24.8] Acute on chronic renal failure ([place]) [N17.9, N18.9] Hypertensive emergency  [I16.1]  1. End Stage Renal Disease: with nephrotic range proteinuria: suspect is renal failure was due to undiagnosed Focal Segmental Glomerulosclerosis seeing his quick progression of disease. Started on dialysis due to uncontrolled hypertension and uremic symptoms.  - RIJ permcath placed. First dialysis on 8/24. Will require three treatments.  - Outpatient planning: [place] Will need tunneled catheter before discharge.   2. Hypertension with urgency: now off nifedipine gtt. Home medications of furosemide 40mg  daily, clonidine 0.2mg  bid, carvedilol 25mg  bid, amlodipine 10mg  daily, and hydralazine 50mg  tid. - Restarted on amlodipine, carvedilol, clonidine and hydralazine.  - Hold spironolactone due to renal failure. - Hold furosemide due to hypokalemia and renal failure.  - Restart losartan 100mg  daily - Give all medications before next dialysis treatment   3. Hypokalemia: renal potassium wasting? From hypermineralcortisolism? magnesium at goal.  - Continue potassium replacement.  - 4K bath  4. Hyponatremia: sodium improved, secondary to renal failure.      LOS: [place], Calliope Delangel 8/24/20171:23 PM

## 2016-07-11 NOTE — Progress Notes (Signed)
To specials via bed 

## 2016-07-11 NOTE — Care Management (Signed)
Perm cath placed today and patient to receive his first dialysis treatment.  Informed patient and staff that he should sit for the treatment.  Received call from [place] with [facility] referral intake and informed patient has been accepted at [facility] and first appointment at present is set for Thursday August 31.  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  Says he has short term disability but does not know how to go about filing for it.  Instructed to contact his employer to initiate claim.

## 2016-07-11 NOTE — Progress Notes (Signed)
Dialysis started 

## 2016-07-12 ENCOUNTER — Inpatient Hospital Stay: Payer: Commercial Managed Care - HMO

## 2016-07-12 ENCOUNTER — Encounter: Payer: Self-pay | Admitting: Vascular Surgery

## 2016-07-12 LAB — DIFFERENTIAL
BASOS PCT: 1 %
Basophils Absolute: 0 10*3/uL (ref 0–0.1)
EOS ABS: 0.2 10*3/uL (ref 0–0.7)
EOS PCT: 3 %
Lymphocytes Relative: 20 %
Lymphs Abs: 1.5 10*3/uL (ref 1.0–3.6)
MONO ABS: 1 10*3/uL (ref 0.2–1.0)
MONOS PCT: 14 %
Neutro Abs: 4.6 10*3/uL (ref 1.4–6.5)
Neutrophils Relative %: 62 %

## 2016-07-12 LAB — CBC
HEMATOCRIT: 35.3 % — AB (ref 40.0–52.0)
Hemoglobin: 12.3 g/dL — ABNORMAL LOW (ref 13.0–18.0)
MCH: 28.8 pg (ref 26.0–34.0)
MCHC: 34.8 g/dL (ref 32.0–36.0)
MCV: 82.8 fL (ref 80.0–100.0)
Platelets: 138 10*3/uL — ABNORMAL LOW (ref 150–440)
RBC: 4.27 MIL/uL — ABNORMAL LOW (ref 4.40–5.90)
RDW: 14.7 % — AB (ref 11.5–14.5)
WBC: 7.3 10*3/uL (ref 3.8–10.6)

## 2016-07-12 LAB — HEPATITIS B SURFACE ANTIGEN: Hepatitis B Surface Ag: NEGATIVE

## 2016-07-12 LAB — RENAL FUNCTION PANEL
Albumin: 2.9 g/dL — ABNORMAL LOW (ref 3.5–5.0)
Anion gap: 9 (ref 5–15)
BUN: 44 mg/dL — AB (ref 6–20)
CHLORIDE: 99 mmol/L — AB (ref 101–111)
CO2: 29 mmol/L (ref 22–32)
CREATININE: 5.93 mg/dL — AB (ref 0.61–1.24)
Calcium: 8.4 mg/dL — ABNORMAL LOW (ref 8.9–10.3)
GFR calc Af Amer: 13 mL/min — ABNORMAL LOW (ref >60)
GFR calc non Af Amer: 11 mL/min — ABNORMAL LOW (ref >60)
Glucose, Bld: 101 mg/dL — ABNORMAL HIGH (ref 65–99)
Phosphorus: 3.9 mg/dL (ref 2.5–4.6)
Potassium: 3.1 mmol/L — ABNORMAL LOW (ref 3.5–5.1)
Sodium: 137 mmol/L (ref 135–145)

## 2016-07-12 LAB — MAGNESIUM: MAGNESIUM: 2.2 mg/dL (ref 1.7–2.4)

## 2016-07-12 NOTE — Care Management (Signed)
CXR requested for dialysis referral.  It appears last chest xray was 07/14/2015.  Faxed the following to Jody at [facility] to update referral- updated demographics, flow sheet for first dialysis treatment, EKG, hep screen results.

## 2016-07-12 NOTE — Progress Notes (Signed)
PRE DIALYSIS ASSESSMENT 

## 2016-07-12 NOTE — Progress Notes (Signed)
This note also relates to the following rows which could not be included: Pulse Rate - Cannot attach notes to unvalidated device data Resp - Cannot attach notes to unvalidated device data BP - Cannot attach notes to unvalidated device data SpO2 - Cannot attach notes to unvalidated device data  END OF HD [place]

## 2016-07-12 NOTE — Progress Notes (Signed)
This note also relates to the following rows which could not be included: Pulse Rate - Cannot attach notes to unvalidated device data Resp - Cannot attach notes to unvalidated device data SpO2 - Cannot attach notes to unvalidated device data  POST HD VITALS

## 2016-07-12 NOTE — Progress Notes (Signed)
END OF HD ASSESSMENT

## 2016-07-12 NOTE — Care Management (Signed)
Faxed CXR results, PPD results and flowsheet from second dialysis treatment to [facility].  Dr Juleen China states that Can anticipate patient's first outpatient treatment  on Thursday August 31. This would mean anticipated discharge date would be 8/29 (tues) after dialysis treatment.  Spoke with Debbie from the local clinic who relayed that patient will be placed on a T Th S schedule and right now it appears appointment time will be around 12p, but the time could change.  CM will call Debbie on 8/28 to confirm 8/31 as a start date.

## 2016-07-12 NOTE — Progress Notes (Signed)
DIALYSIS STARTED

## 2016-07-12 NOTE — Progress Notes (Signed)
PPD test on left FA read, no induration present: negative.

## 2016-07-12 NOTE — Clinical Social Work Note (Signed)
MSW received referral for patient wanting to talk to Education officer, [employer].  MSW went to patient's room and he was in a procedure, will follow up at a later time.  Jones Broom. Troy Kanouse, MSW [phone]  Mon-Fri 8a-4:30p 07/12/2016 3:20 PM

## 2016-07-12 NOTE — Progress Notes (Signed)
[facility]  ROUNDING NOTE   Subjective:   Seen and examined on third hemodialysis treatment. Tolerating treatment well. Blood pressure low for him, he feels weak.   Objective:  Vital signs in last 24 hours:  Temp:  [97.6 F (36.4 C)-98.4 F (36.9 C)] 98.4 F (36.9 C) (08/25 1000) Pulse Rate:  [60-71] 61 (08/25 1030) Resp:  [16-20] 17 (08/25 1030) BP: (148-200)/(82-136) 158/96 (08/25 1030) SpO2:  [97 %-100 %] 99 % (08/25 0420) Weight:  [107.7 kg (237 lb 7 oz)-108.8 kg (239 lb 13.8 oz)] 107.7 kg (237 lb 7 oz) (08/24 1432)  Weight change: -1.651 kg (-3 lb 10.2 oz) Filed Weights   07/11/16 1155 07/11/16 1409 07/11/16 1432  Weight: 108.8 kg (239 lb 13.8 oz) 108.8 kg (239 lb 13.8 oz) 107.7 kg (237 lb 7 oz)    Intake/Output: I/O last 3 completed shifts: In: 243 [P.O.:240; I.V.:3] Out: 3350 [Urine:3350]   Intake/Output this shift:  Total I/O In: -  Out: 300 [Urine:300]  Physical Exam: General: NAD, sitting in chair  Head: Normocephalic, atraumatic. Moist oral mucosal membranes  Eyes: Anicteric, PERRL  Neck: Supple, trachea midline  Lungs:  Clear to auscultation  Heart: Regular rate and rhythm  Abdomen:  Soft, nontender,   Extremities: no peripheral edema.  Neurologic: Nonfocal, moving all four extremities  Skin: No lesions  Access: RIJ permcath, Dr. Lucky Cowboy [id number]    Basic Metabolic Panel:  Recent Labs Lab 07/09/16 1802 07/10/16 0441 07/10/16 1235 07/11/16 0416 07/12/16 0511  NA 131* 135  --  135 137  K 2.3* 2.5* 3.1* 2.8* 3.1*  CL 92* 96*  --  97* 99*  CO2 29 28  --  28 29  GLUCOSE 126* 146*  --  104* 101*  BUN 53* 49*  --  52* 44*  CREATININE 6.01* 5.98*  --  5.99* 5.93*  CALCIUM 9.1 8.6*  --  8.5* 8.4*  MG 1.9  --   --  2.2 2.2  PHOS  --   --   --   --  3.9    Liver Function Tests:  Recent Labs Lab 07/12/16 0511  ALBUMIN 2.9*   No results for input(s): LIPASE, AMYLASE in the last 168 hours. No results for input(s): AMMONIA in the  last 168 hours.  CBC:  Recent Labs Lab 07/09/16 1802 07/10/16 0441 07/12/16 0511  WBC 8.5 8.4 7.3  NEUTROABS  --   --  4.6  HGB 14.0 13.5 12.3*  HCT 39.5* 38.9* 35.3*  MCV 81.3 81.9 82.8  PLT 160 158 138*    Cardiac Enzymes:  Recent Labs Lab 07/09/16 1802  TROPONINI 0.10*    BNP: Invalid input(s): POCBNP  CBG:  Recent Labs Lab 07/09/16 2156  GLUCAP 163*    Microbiology: Results for orders placed or performed during the hospital encounter of 07/09/16  MRSA PCR Screening     Status: None   Collection Time: 07/09/16 10:01 PM  Result Value Ref Range Status   MRSA by PCR NEGATIVE NEGATIVE Final    Comment:        The GeneXpert MRSA Assay (FDA approved for NASAL specimens only), is one component of a comprehensive MRSA colonization surveillance program. It is not intended to diagnose MRSA infection nor to guide or monitor treatment for MRSA infections.     Coagulation Studies: No results for input(s): LABPROT, INR in the last 72 hours.  Urinalysis: No results for input(s): COLORURINE, LABSPEC, [place], [place], [place], [place], [place], [place], [place], NITRITE, LEUKOCYTESUR  in the last 72 hours.  Invalid input(s): APPERANCEUR    Imaging: No results found.   Medications:     . amLODipine  10 mg Oral Daily  . carvedilol  25 mg Oral BID WC  . cloNIDine  0.2 mg Oral BID  . heparin  5,000 Units Subcutaneous Q8H  . hydrALAZINE  50 mg Oral TID  . labetalol  20 mg Intravenous Once  . losartan  100 mg Oral Daily  . sodium chloride flush  3 mL Intravenous Q12H  . tuberculin  5 Units Intradermal Once   acetaminophen **OR** acetaminophen, morphine injection, ondansetron **OR** ondansetron (ZOFRAN) IV, oxyCODONE  Assessment/ Plan:  Alan Gross is a 30 y.o. black male with hypertension, childhood seizures, TIA who presents for Hypokalemia [E87.6] Demand ischemia ([place]) [I24.8] Acute on chronic renal failure ([place]) [N17.9,  N18.9] Hypertensive emergency [I16.1]  1. End Stage Renal Disease: with nephrotic range proteinuria and anasarca: suspect pateint's renal failure is due to undiagnosed Focal Segmental Glomerulosclerosis seeing his quick progression of disease. Started on dialysis due to uncontrolled hypertension and uremic symptoms.  - RIJ permcath placed 8/24. First dialysis on 8/24. Seen and examined on his second treatmetn.  - Outpatient planning: [place]   2. Hypertension with urgency: now off nifedipine gtt. Home medications of furosemide 40mg  daily, clonidine 0.2mg  bid, carvedilol 25mg  bid, amlodipine 10mg  daily, and hydralazine 50mg  tid. - Restarted on losartan, amlodipine, carvedilol, clonidine and hydralazine.  - Hold spironolactone due to renal failure. - Hold furosemide due to hypokalemia and renal failure.   3. Hypokalemia: renal potassium wasting? From hypermineralcortisolism? magnesium at goal.  - Continue potassium replacement.  - 4K bath  4. Hyponatremia: sodium improved, secondary to renal failure.   5. Secondary Hyperaparathyroidism: pending PTH, phos and corrected calcium at goal.     LOS: 3 Devoiry Corriher 8/25/201711:00 AM

## 2016-07-12 NOTE — Progress Notes (Signed)
Patient ID: Alan Gross, male   DOB: 1986-09-01, 30 y.o.   MRN: [id number]   [facility] PROGRESS NOTE  Alan Gross [id number] DOB: 02-11-1986 DOA: 07/09/2016 PCP: No PCP Per Patient  HPI/Subjective: Patient feeling better today. Slight headache. No blurry vision. No numbness of the lower extremities.  Objective: Vitals:   07/12/16 1235 07/12/16 1320  BP: 137/84 (!) 149/95  Pulse: (!) 57 (!) 56  Resp: 14 18  Temp:  97.7 F (36.5 C)    Filed Weights   07/11/16 1432 07/12/16 1230 07/12/16 1320  Weight: 107.7 kg (237 lb 7 oz) 110 kg (242 lb 8.1 oz) 110 kg (242 lb 8.1 oz)    ROS: Review of Systems  Constitutional: Negative for chills and fever.  Eyes: Negative for blurred vision.  Respiratory: Negative for cough and shortness of breath.   Cardiovascular: Negative for chest pain.  Gastrointestinal: Negative for abdominal pain, constipation, diarrhea, nausea and vomiting.  Genitourinary: Negative for dysuria.  Musculoskeletal: Negative for joint pain.  Neurological: Positive for weakness. Negative for dizziness, sensory change and headaches.   Exam: Physical Exam  Constitutional: He is oriented to person, place, and time.  HENT:  Nose: No mucosal edema.  Mouth/Throat: No oropharyngeal exudate or posterior oropharyngeal edema.  Eyes: Conjunctivae, EOM and lids are normal. Pupils are equal, round, and reactive to light.  Neck: No JVD present. Carotid bruit is not present. No edema present. No thyroid mass and no thyromegaly present.  Cardiovascular: Regular rhythm, S1 normal and S2 normal.  Exam reveals no gallop.   Murmur heard.  Systolic murmur is present with a grade of 2/6  Pulses:      Dorsalis pedis pulses are 2+ on the right side, and 2+ on the left side.  Respiratory: No respiratory distress. He has no wheezes. He has no rhonchi. He has no rales.  GI: Soft. Bowel sounds are normal. There is tenderness in the epigastric area.  Musculoskeletal:        Right ankle: He exhibits no swelling.       Left ankle: He exhibits no swelling.  Lymphadenopathy:    He has no cervical adenopathy.  Neurological: He is alert and oriented to person, place, and time. No cranial nerve deficit.  Skin: Skin is warm. No rash noted. Nails show no clubbing.  Psychiatric: He has a normal mood and affect.      Data Reviewed: Basic Metabolic Panel:  Recent Labs Lab 07/09/16 1802 07/10/16 0441 07/10/16 1235 07/11/16 0416 07/12/16 0511  NA 131* 135  --  135 137  K 2.3* 2.5* 3.1* 2.8* 3.1*  CL 92* 96*  --  97* 99*  CO2 29 28  --  28 29  GLUCOSE 126* 146*  --  104* 101*  BUN 53* 49*  --  52* 44*  CREATININE 6.01* 5.98*  --  5.99* 5.93*  CALCIUM 9.1 8.6*  --  8.5* 8.4*  MG 1.9  --   --  2.2 2.2  PHOS  --   --   --   --  3.9   CBC:  Recent Labs Lab 07/09/16 1802 07/10/16 0441 07/12/16 0511  WBC 8.5 8.4 7.3  NEUTROABS  --   --  4.6  HGB 14.0 13.5 12.3*  HCT 39.5* 38.9* 35.3*  MCV 81.3 81.9 82.8  PLT 160 158 138*   Cardiac Enzymes:  Recent Labs Lab 07/09/16 1802  TROPONINI 0.10*    CBG:  Recent Labs Lab 07/09/16 2156  GLUCAP 163*  Recent Results (from the past 240 hour(s))  MRSA PCR Screening     Status: None   Collection Time: 07/09/16 10:01 PM  Result Value Ref Range Status   MRSA by PCR NEGATIVE NEGATIVE Final    Comment:        The GeneXpert MRSA Assay (FDA approved for NASAL specimens only), is one component of a comprehensive MRSA colonization surveillance program. It is not intended to diagnose MRSA infection nor to guide or monitor treatment for MRSA infections.       Scheduled Meds: . amLODipine  10 mg Oral Daily  . carvedilol  25 mg Oral BID WC  . cloNIDine  0.2 mg Oral BID  . heparin  5,000 Units Subcutaneous Q8H  . hydrALAZINE  50 mg Oral TID  . labetalol  20 mg Intravenous Once  . losartan  100 mg Oral Daily  . sodium chloride flush  3 mL Intravenous Q12H  . tuberculin  5 Units Intradermal Once     Assessment/Plan:  1. Hypertensive urgency. Blood pressure trending better on normal medications plus losartan. 2. Hypomagnesemia- replaced 3. Acute Kidney injury likely now end-stage renal disease. Nephrology had PermCath placed. Patient finished his second dialysis session today and will have his third dialysis session tomorrow and likely be discharged after that as long as outpatient dialysis slot is obtained today. 4. Elevated troponin. Demand ischemia secondary to hypertensive urgency. 5. Nausea and vomiting- resolved  Code Status:     Code Status Orders        Start     Ordered   07/09/16 1951  Full code  Continuous     07/09/16 1950    Code Status History    Date Active Date Inactive Code Status Order ID Comments User Context   04/16/2016 10:51 PM 04/18/2016  8:14 PM Full Code [id number]  Alan Baton, MD Inpatient   05/02/2015  7:22 PM 05/04/2015  2:49 PM Full Code [id number]  Alan Loll, MD Inpatient   05/02/2015  3:32 PM 05/02/2015  7:22 PM Full Code [id number]  Alan David, MD ED      Disposition Plan: Potential out of the hospital tomorrow after dialysis as long as outpatient dialysis slot is obtained today.  Consultants: Nephrology  Time spent: 20 minutes.  Alan Gross  [employer]

## 2016-07-13 LAB — CBC
HEMATOCRIT: 37.2 % — AB (ref 40.0–52.0)
Hemoglobin: 12.7 g/dL — ABNORMAL LOW (ref 13.0–18.0)
MCH: 28.3 pg (ref 26.0–34.0)
MCHC: 34 g/dL (ref 32.0–36.0)
MCV: 83.3 fL (ref 80.0–100.0)
PLATELETS: 165 10*3/uL (ref 150–440)
RBC: 4.47 MIL/uL (ref 4.40–5.90)
RDW: 14.9 % — AB (ref 11.5–14.5)
WBC: 7.2 10*3/uL (ref 3.8–10.6)

## 2016-07-13 LAB — RENAL FUNCTION PANEL
ALBUMIN: 2.9 g/dL — AB (ref 3.5–5.0)
ANION GAP: 6 (ref 5–15)
BUN: 39 mg/dL — AB (ref 6–20)
CO2: 29 mmol/L (ref 22–32)
Calcium: 8.6 mg/dL — ABNORMAL LOW (ref 8.9–10.3)
Chloride: 101 mmol/L (ref 101–111)
Creatinine, Ser: 5.44 mg/dL — ABNORMAL HIGH (ref 0.61–1.24)
GFR, EST AFRICAN AMERICAN: 15 mL/min — AB (ref >60)
GFR, EST NON AFRICAN AMERICAN: 13 mL/min — AB (ref >60)
Glucose, Bld: 114 mg/dL — ABNORMAL HIGH (ref 65–99)
PHOSPHORUS: 3.4 mg/dL (ref 2.5–4.6)
POTASSIUM: 3.3 mmol/L — AB (ref 3.5–5.1)
Sodium: 136 mmol/L (ref 135–145)

## 2016-07-13 LAB — HEPATITIS B CORE ANTIBODY, TOTAL: Hep B Core Total Ab: NEGATIVE

## 2016-07-13 LAB — PARATHYROID HORMONE, INTACT (NO CA): PTH: 101 pg/mL — AB (ref 15–65)

## 2016-07-13 LAB — HEPATITIS B SURFACE ANTIBODY,QUALITATIVE: Hep B S Ab: NONREACTIVE

## 2016-07-13 NOTE — Progress Notes (Signed)
Patient ID: Alan Gross, male   DOB: 02-11-1986, 30 y.o.   MRN: [id number]   [facility] PROGRESS NOTE  Alan Gross [id number] DOB: 26-Jul-1986 DOA: 07/09/2016 PCP: No PCP Per Patient  HPI/Subjective: Patient feeling better today. Slight headache. No blurry vision. No numbness of the lower extremities.  Objective: Vitals:   07/13/16 1300 07/13/16 1330  BP: (!) 173/115 (!) 177/131  Pulse: 65 71  Resp:    Temp:      Filed Weights   07/12/16 1230 07/12/16 1320 07/13/16 1155  Weight: 110 kg (242 lb 8.1 oz) 110 kg (242 lb 8.1 oz) 111.3 kg (245 lb 6 oz)    ROS: Review of Systems  Constitutional: Negative for chills and fever.  Eyes: Negative for blurred vision.  Respiratory: Negative for cough and shortness of breath.   Cardiovascular: Negative for chest pain.  Gastrointestinal: Negative for abdominal pain, constipation, diarrhea, nausea and vomiting.  Genitourinary: Negative for dysuria.  Musculoskeletal: Negative for joint pain.  Neurological: Positive for weakness. Negative for dizziness, sensory change and headaches.   Exam: Physical Exam  Constitutional: He is oriented to person, place, and time.  HENT:  Nose: No mucosal edema.  Mouth/Throat: No oropharyngeal exudate or posterior oropharyngeal edema.  Eyes: Conjunctivae, EOM and lids are normal. Pupils are equal, round, and reactive to light.  Neck: No JVD present. Carotid bruit is not present. No edema present. No thyroid mass and no thyromegaly present.  Cardiovascular: Regular rhythm, S1 normal and S2 normal.  Exam reveals no gallop.   Murmur heard.  Systolic murmur is present with a grade of 2/6  Pulses:      Dorsalis pedis pulses are 2+ on the right side, and 2+ on the left side.  Respiratory: No respiratory distress. He has no wheezes. He has no rhonchi. He has no rales.  GI: Soft. Bowel sounds are normal. There is tenderness in the epigastric area.  Musculoskeletal:       Right ankle: He exhibits  no swelling.       Left ankle: He exhibits no swelling.  Lymphadenopathy:    He has no cervical adenopathy.  Neurological: He is alert and oriented to person, place, and time. No cranial nerve deficit.  Skin: Skin is warm. No rash noted. Nails show no clubbing.  Psychiatric: He has a normal mood and affect.      Data Reviewed: Basic Metabolic Panel:  Recent Labs Lab 07/09/16 1802 07/10/16 0441 07/10/16 1235 07/11/16 0416 07/12/16 0511 07/13/16 1155  NA 131* 135  --  135 137 136  K 2.3* 2.5* 3.1* 2.8* 3.1* 3.3*  CL 92* 96*  --  97* 99* 101  CO2 29 28  --  28 29 29   GLUCOSE 126* 146*  --  104* 101* 114*  BUN 53* 49*  --  52* 44* 39*  CREATININE 6.01* 5.98*  --  5.99* 5.93* 5.44*  CALCIUM 9.1 8.6*  --  8.5* 8.4* 8.6*  MG 1.9  --   --  2.2 2.2  --   PHOS  --   --   --   --  3.9 3.4   CBC:  Recent Labs Lab 07/09/16 1802 07/10/16 0441 07/12/16 0511 07/13/16 1155  WBC 8.5 8.4 7.3 7.2  NEUTROABS  --   --  4.6  --   HGB 14.0 13.5 12.3* 12.7*  HCT 39.5* 38.9* 35.3* 37.2*  MCV 81.3 81.9 82.8 83.3  PLT 160 158 138* 165   Cardiac Enzymes:  Recent  Labs Lab 07/09/16 1802  TROPONINI 0.10*    CBG:  Recent Labs Lab 07/09/16 2156  GLUCAP 163*    Recent Results (from the past 240 hour(s))  MRSA PCR Screening     Status: None   Collection Time: 07/09/16 10:01 PM  Result Value Ref Range Status   MRSA by PCR NEGATIVE NEGATIVE Final    Comment:        The GeneXpert MRSA Assay (FDA approved for NASAL specimens only), is one component of a comprehensive MRSA colonization surveillance program. It is not intended to diagnose MRSA infection nor to guide or monitor treatment for MRSA infections.       Scheduled Meds: . amLODipine  10 mg Oral Daily  . carvedilol  25 mg Oral BID WC  . cloNIDine  0.2 mg Oral BID  . heparin  5,000 Units Subcutaneous Q8H  . hydrALAZINE  50 mg Oral TID  . labetalol  20 mg Intravenous Once  . losartan  100 mg Oral Daily  . sodium  chloride flush  3 mL Intravenous Q12H    Assessment/Plan:  1. Hypertensive urgency. Blood pressures improved continue current regimen 2. Hypomagnesemia- replaced 3. End-stage renal disease Nephrology had PermCath placed. Arrangements for outpatient dialysis are being made. 4. Elevated troponin. Demand ischemia secondary to hypertensive urgency. 5. Nausea and vomiting- resolved  Code Status:     Code Status Orders        Start     Ordered   07/09/16 1951  Full code  Continuous     07/09/16 1950    Code Status History    Date Active Date Inactive Code Status Order ID Comments User Context   04/16/2016 10:51 PM 04/18/2016  8:14 PM Full Code [id number]  Quintella Baton, MD Inpatient   05/02/2015  7:22 PM 05/04/2015  2:49 PM Full Code [id number]  Demetrios Loll, MD Inpatient   05/02/2015  3:32 PM 05/02/2015  7:22 PM Full Code [id number]  Dionisio David, MD ED      Disposition Plan: Discharge her nephrology are discussed with Dr.Koluru  Consultants: Nephrology  Time spent: 20 minutes.  Posey Pronto [facility]

## 2016-07-13 NOTE — Progress Notes (Signed)
HD STARTED  

## 2016-07-13 NOTE — Care Management Note (Signed)
Patient requested CSW to assist with disability questions. CSW provided information on how to contact SS office for long-term disability and advised patient to contact his HR representative for short-term disability. Patient requested information about how dialysis would affect his ability to work. CSW advised patient that extreme heat and cold as well as heavy lifting would be problematic, in addition to fatigue related to dialysis. CSW advised patient that dialysis schedule could also affect his ability to work full-time, but that as long as his PCP and nephrologist indicate that he can work, he is able to do so. Patient indicated that he has a desire to work. Patient thanked CSW for information and emotional support. CSW signing off.  Santiago Bumpers, MSW, LCSW-A [phone]

## 2016-07-13 NOTE — Progress Notes (Signed)
[facility]  ROUNDING NOTE   Subjective:   Tolerated third hemodialysis treatment. Dialysis for later today  Objective:  Vital signs in last 24 hours:  Temp:  [97.7 F (36.5 C)-98.7 F (37.1 C)] 98.2 F (36.8 C) (08/26 0558) Pulse Rate:  [55-63] 60 (08/26 0558) Resp:  [14-22] 18 (08/26 0558) BP: (132-158)/(76-97) 151/97 (08/26 0558) SpO2:  [97 %-100 %] 97 % (08/26 0558) Weight:  [110 kg (242 lb 8.1 oz)] 110 kg (242 lb 8.1 oz) (08/25 1320)  Weight change: 1.2 kg (2 lb 10.3 oz) Filed Weights   07/11/16 1432 07/12/16 1230 07/12/16 1320  Weight: 107.7 kg (237 lb 7 oz) 110 kg (242 lb 8.1 oz) 110 kg (242 lb 8.1 oz)    Intake/Output: I/O last 3 completed shifts: In: 240 [P.O.:240] Out: 2850 [Urine:1850; Other:1000]   Intake/Output this shift:  Total I/O In: 240 [P.O.:240] Out: 175 [Urine:175]  Physical Exam: General: NAD, sitting in chair  Head: Normocephalic, atraumatic. Moist oral mucosal membranes  Eyes: Anicteric, PERRL  Neck: Supple, trachea midline  Lungs:  Clear to auscultation  Heart: Regular rate and rhythm  Abdomen:  Soft, nontender,   Extremities: no peripheral edema.  Neurologic: Nonfocal, moving all four extremities  Skin: No lesions  Access: RIJ permcath, Dr. Lucky Cowboy [id number]    Basic Metabolic Panel:  Recent Labs Lab 07/09/16 1802 07/10/16 0441 07/10/16 1235 07/11/16 0416 07/12/16 0511  NA 131* 135  --  135 137  K 2.3* 2.5* 3.1* 2.8* 3.1*  CL 92* 96*  --  97* 99*  CO2 29 28  --  28 29  GLUCOSE 126* 146*  --  104* 101*  BUN 53* 49*  --  52* 44*  CREATININE 6.01* 5.98*  --  5.99* 5.93*  CALCIUM 9.1 8.6*  --  8.5* 8.4*  MG 1.9  --   --  2.2 2.2  PHOS  --   --   --   --  3.9    Liver Function Tests:  Recent Labs Lab 07/12/16 0511  ALBUMIN 2.9*   No results for input(s): LIPASE, AMYLASE in the last 168 hours. No results for input(s): AMMONIA in the last 168 hours.  CBC:  Recent Labs Lab 07/09/16 1802 07/10/16 0441  07/12/16 0511  WBC 8.5 8.4 7.3  NEUTROABS  --   --  4.6  HGB 14.0 13.5 12.3*  HCT 39.5* 38.9* 35.3*  MCV 81.3 81.9 82.8  PLT 160 158 138*    Cardiac Enzymes:  Recent Labs Lab 07/09/16 1802  TROPONINI 0.10*    BNP: Invalid input(s): POCBNP  CBG:  Recent Labs Lab 07/09/16 2156  GLUCAP 163*    Microbiology: Results for orders placed or performed during the hospital encounter of 07/09/16  MRSA PCR Screening     Status: None   Collection Time: 07/09/16 10:01 PM  Result Value Ref Range Status   MRSA by PCR NEGATIVE NEGATIVE Final    Comment:        The GeneXpert MRSA Assay (FDA approved for NASAL specimens only), is one component of a comprehensive MRSA colonization surveillance program. It is not intended to diagnose MRSA infection nor to guide or monitor treatment for MRSA infections.     Coagulation Studies: No results for input(s): LABPROT, INR in the last 72 hours.  Urinalysis: No results for input(s): COLORURINE, LABSPEC, PHURINE, GLUCOSEU, HGBUR, BILIRUBINUR, KETONESUR, PROTEINUR, UROBILINOGEN, NITRITE, LEUKOCYTESUR in the last 72 hours.  Invalid input(s): APPERANCEUR    Imaging: Dg Chest 2  View  Result Date: 07/12/2016 CLINICAL DATA:  Pulmonary tuberculosis, smoker EXAM: CHEST  2 VIEW COMPARISON:  04/16/2016 FINDINGS: Borderline cardiomegaly. There is right IJ dual-lumen catheter with tip in SVC right atrium junction. No pneumothorax. No infiltrate or pulmonary edema. Bony thorax is unremarkable. IMPRESSION: Borderline cardiomegaly. There is right IJ dual-lumen catheter with tip in SVC right atrium junction. No pneumothorax. No infiltrate or pulmonary edema. Electronically Signed   By: Lahoma Crocker M.D.   On: 07/12/2016 14:45     Medications:     . amLODipine  10 mg Oral Daily  . carvedilol  25 mg Oral BID WC  . cloNIDine  0.2 mg Oral BID  . heparin  5,000 Units Subcutaneous Q8H  . hydrALAZINE  50 mg Oral TID  . labetalol  20 mg Intravenous Once   . losartan  100 mg Oral Daily  . sodium chloride flush  3 mL Intravenous Q12H   acetaminophen **OR** acetaminophen, morphine injection, ondansetron **OR** ondansetron (ZOFRAN) IV, oxyCODONE  Assessment/ Plan:  Mr. Edrin Sampley is a 30 y.o. black male with hypertension, childhood seizures, TIA who presents for Hypokalemia [E87.6] Demand ischemia ([place]) [I24.8] Acute on chronic renal failure ([place]) [N17.9, N18.9] Hypertensive emergency [I16.1]  1. End Stage Renal Disease: with nephrotic range proteinuria and anasarca: suspect pateint's renal failure is due to undiagnosed Focal Segmental Glomerulosclerosis seeing his quick progression of disease. Started on dialysis due to uncontrolled hypertension and uremic symptoms.  - RIJ permcath placed 8/24. First dialysis on 8/24. Fourth treatment for today - Outpatient planning: [place] Starting 8/31  2. Hypertension with urgency: now off nifedipine gtt. Home medications of furosemide 40mg  daily, clonidine 0.2mg  bid, carvedilol 25mg  bid, amlodipine 10mg  daily, and hydralazine 50mg  tid. - Restarted on losartan, amlodipine, carvedilol, clonidine and hydralazine.  - Restart spironolactone and monitor due to renal failure - Hold furosemide due to hypokalemia and renal failure.   3. Hypokalemia: renal potassium wasting? From hypermineralcortisolism? magnesium at goal.  - Continue potassium replacement.  - 4K bath  4. Hyponatremia: sodium improved, secondary to renal failure.   5. Secondary Hyperaparathyroidism: PTH 101, phos and corrected calcium at goal.  - no indication for vitamin D agent or binders.     LOS: 4 Creedence Heiss 8/26/201710:14 AM

## 2016-07-13 NOTE — Progress Notes (Signed)
PRE DIALYSIS ASSESSMENT 

## 2016-07-13 NOTE — Progress Notes (Signed)
HD completed

## 2016-07-13 NOTE — Progress Notes (Signed)
Post dialysis assessment 

## 2016-07-13 NOTE — Plan of Care (Signed)
Problem: Coping: Goal: Development of coping mechanisms to deal with changes in body function or appearance will improve Outcome: Progressing Patient able to verbalize his feelings in regard to new changes in his life, with dialysis, new physical & dietary restrictions. Pt is grieving somewhat for what he expresses as "being so dependent on something now." Ability to verbalize this is a positive outcome. Patient verbalizes that he knows a lot will have to change for him now and that it will be difficult, but also expresses that he wants to take care of himself better.

## 2016-07-14 LAB — RENAL FUNCTION PANEL
ANION GAP: 7 (ref 5–15)
Albumin: 3 g/dL — ABNORMAL LOW (ref 3.5–5.0)
BUN: 29 mg/dL — ABNORMAL HIGH (ref 6–20)
CALCIUM: 8.7 mg/dL — AB (ref 8.9–10.3)
CO2: 30 mmol/L (ref 22–32)
Chloride: 101 mmol/L (ref 101–111)
Creatinine, Ser: 4.69 mg/dL — ABNORMAL HIGH (ref 0.61–1.24)
GFR, EST AFRICAN AMERICAN: 18 mL/min — AB (ref >60)
GFR, EST NON AFRICAN AMERICAN: 15 mL/min — AB (ref >60)
Glucose, Bld: 101 mg/dL — ABNORMAL HIGH (ref 65–99)
Phosphorus: 4.3 mg/dL (ref 2.5–4.6)
Potassium: 3.5 mmol/L (ref 3.5–5.1)
SODIUM: 138 mmol/L (ref 135–145)

## 2016-07-14 MED ORDER — HYDRALAZINE HCL 50 MG PO TABS
100.0000 mg | ORAL_TABLET | Freq: Three times a day (TID) | ORAL | Status: DC
  Administered 2016-07-14: 100 mg via ORAL
  Filled 2016-07-14: qty 2

## 2016-07-14 MED ORDER — LOSARTAN POTASSIUM 100 MG PO TABS: 100.0000 mg | ORAL_TABLET | Freq: Every day | ORAL | 0 refills | Status: DC

## 2016-07-14 MED ORDER — TRAMADOL HCL 50 MG PO TABS: 50.0000 mg | ORAL_TABLET | Freq: Four times a day (QID) | ORAL | 0 refills | Status: DC | PRN

## 2016-07-14 MED ORDER — SPIRONOLACTONE 25 MG PO TABS: 25.0000 mg | ORAL_TABLET | Freq: Every day | ORAL | 0 refills | Status: DC

## 2016-07-14 MED ORDER — KETOROLAC TROMETHAMINE 30 MG/ML IJ SOLN
30.0000 mg | Freq: Once | INTRAMUSCULAR | Status: AC
  Administered 2016-07-14: 30 mg via INTRAVENOUS
  Filled 2016-07-14: qty 1

## 2016-07-14 MED ORDER — HYDRALAZINE HCL 50 MG PO TABS: 50.0000 mg | ORAL_TABLET | Freq: Three times a day (TID) | ORAL | 0 refills | Status: DC

## 2016-07-14 NOTE — Progress Notes (Signed)
Discharge instructions given to patient. Patient verbalized understanding. No distress at this time.  IVs and tele removed. Patient's ride is at bedside and will be transporting patient home. Hard copy of prescription given to patient.

## 2016-07-14 NOTE — Discharge Instructions (Signed)
°  DIET:  Renal diet  DISCHARGE CONDITION:  Good  ACTIVITY:  Activity as tolerated  OXYGEN:  Home Oxygen: No.   Oxygen Delivery: room air  DISCHARGE LOCATION:  home    ADDITIONAL DISCHARGE INSTRUCTION:   If you experience worsening of your admission symptoms, develop shortness of breath, life threatening emergency, suicidal or homicidal thoughts you must seek medical attention immediately by calling 911 or calling your MD immediately  if symptoms less severe.  You Must read complete instructions/literature along with all the possible adverse reactions/side effects for all the Medicines you take and that have been prescribed to you. Take any new Medicines after you have completely understood and accpet all the possible adverse reactions/side effects.   Please note  You were cared for by a hospitalist during your hospital stay. If you have any questions about your discharge medications or the care you received while you were in the hospital after you are discharged, you can call the unit and asked to speak with the hospitalist on call if the hospitalist that took care of you is not available. Once you are discharged, your primary care physician will handle any further medical issues. Please note that NO REFILLS for any discharge medications will be authorized once you are discharged, as it is imperative that you return to your primary care physician (or establish a relationship with a primary care physician if you do not have one) for your aftercare needs so that they can reassess your need for medications and monitor your lab values.

## 2016-07-14 NOTE — Care Management Note (Signed)
Case Management Note  Patient Details  Name: Alan Gross MRN: [id number] Date of Birth: 1986/06/05 Subjective/Objective:        E-mail sent to Alda Lea who is covering for [facility] Lia[facility] updating her to please follow up with [name]'s outpatient dialysis appointment with [facility].             Action/Plan:   Expected Discharge Date:                  Expected Discharge Plan:     In-House Referral:     Discharge planning Services     Post Acute Care Choice:    Choice offered to:     DME Arranged:    DME Agency:     HH Arranged:    HH Agency:     Status of Service:     If discussed at [employer] of Stay Meetings, dates discussed:    Additional Comments:  Micai Apolinar A, RN 07/14/2016, 9:54 AM

## 2016-07-14 NOTE — Discharge Summary (Signed)
Alan Gross, 30 y.o., DOB 25-Dec-1985, MRN [id number]. Admission date: 07/09/2016 Discharge Date 07/14/2016 Primary MD No PCP Per Patient Admitting Physician Lytle Butte, MD  Admission Diagnosis  Hypokalemia [E87.6] Demand ischemia [facility]) [I24.8] Acute on chronic renal failure (HCC) [N17.9, N18.9] Hypertensive emergency [I16.1]  Discharge Diagnosis   Active Problems:   Hypertensive crisis  End-stage renal disease started on dialysis  Med noncompliance  History of seizures       Hospital Course patient is a 30 year old male with known history of chronic kidney disease stage IV essential hypertension presenting with weakness and headaches. Patient blood pressure was in the 260s or 140s. He was seen in the ED. And evaluated and admitted for further evaluation and therapy. Patient was seen by nephrology they felt that he needed to be started on hemodialysis. Patient was seen by vascular surgery. They went ahead and placed vascular access in the right internal jugular [place]. Patient tolerated the procedure without any complications. He was started on hemodialysis which he has tolerated. His blood pressures improved as well. Patient has been very anxious to go home. He is told that he will need to follow up for hemodialysis and coming Thursday. Today he was complaining of pain at the site of the catheter placement.           Consults  nephrology, vascular surgery  Significant Tests:  See full reports for all details    Dg Chest 2 View  Result Date: 07/12/2016 CLINICAL DATA:  Pulmonary tuberculosis, smoker EXAM: CHEST  2 VIEW COMPARISON:  04/16/2016 FINDINGS: Borderline cardiomegaly. There is right IJ dual-lumen catheter with tip in SVC right atrium junction. No pneumothorax. No infiltrate or pulmonary edema. Bony thorax is unremarkable. IMPRESSION: Borderline cardiomegaly. There is right IJ dual-lumen catheter with tip in SVC right atrium junction. No pneumothorax. No infiltrate or  pulmonary edema. Electronically Signed   By: Lahoma Crocker M.D.   On: 07/12/2016 14:45       Today   Subjective:   Alan Gross  complains of pain at the site of the right internal jugular vein catheter because he bumped it to the bed side rail  Objective:   Blood pressure (!) 174/95, pulse 63, temperature 98.2 F (36.8 C), temperature source Oral, resp. rate 16, height 6\' 1"  (1.854 m), weight 110.4 kg (243 lb 6.4 oz), SpO2 93 %.  .  Intake/Output Summary (Last 24 hours) at 07/14/16 1550 Last data filed at 07/14/16 0950  Gross per 24 hour  Intake             1280 ml  Output              850 ml  Net              430 ml    Exam VITAL SIGNS: Blood pressure (!) 174/95, pulse 63, temperature 98.2 F (36.8 C), temperature source Oral, resp. rate 16, height 6\' 1"  (1.854 m), weight 110.4 kg (243 lb 6.4 oz), SpO2 93 %.  GENERAL:  30 y.o.-year-old patient lying in the bed with no acute distress.  EYES: Pupils equal, round, reactive to light and accommodation. No scleral icterus. Extraocular muscles intact.  HEENT: Head atraumatic, normocephalic. Oropharynx and nasopharynx clear.  NECK:  Supple, no jugular venous distention. No thyroid enlargement, no tenderness.  LUNGS: Normal breath sounds bilaterally, no wheezing, rales,rhonchi or crepitation. No use of accessory muscles of respiration.  CARDIOVASCULAR: S1, S2 normal. No murmurs, rubs, or gallops.  ABDOMEN: Soft, nontender, nondistended.  Bowel sounds present. No organomegaly or mass.  EXTREMITIES: No pedal edema, cyanosis, or clubbing.  NEUROLOGIC: Cranial nerves II through XII are intact. Muscle strength 5/5 in all extremities. Sensation intact. Gait not checked.  PSYCHIATRIC: The patient is alert and oriented x 3.  SKIN: No obvious rash, lesion, or ulcer.   Data Review     CBC w Diff: Lab Results  Component Value Date   WBC 7.2 07/13/2016   HGB 12.7 (L) 07/13/2016   HGB 14.6 02/14/2015   HCT 37.2 (L) 07/13/2016   HCT 43.1  02/14/2015   PLT 165 07/13/2016   PLT 145 (L) 02/14/2015   LYMPHOPCT 20 07/12/2016   MONOPCT 14 07/12/2016   EOSPCT 3 07/12/2016   BASOPCT 1 07/12/2016   CMP: Lab Results  Component Value Date   NA 138 07/14/2016   NA 139 02/14/2015   K 3.5 07/14/2016   K 3.4 (L) 02/14/2015   CL 101 07/14/2016   CL 101 02/14/2015   CO2 30 07/14/2016   CO2 32 02/14/2015   BUN 29 (H) 07/14/2016   BUN 13 02/14/2015   CREATININE 4.69 (H) 07/14/2016   CREATININE 1.12 02/14/2015   PROT 7.3 07/14/2015   PROT 7.2 10/14/2013   ALBUMIN 3.0 (L) 07/14/2016   ALBUMIN 4.0 10/14/2013   BILITOT 0.4 07/14/2015   BILITOT 0.2 10/14/2013   ALKPHOS 66 07/14/2015   ALKPHOS 73 10/14/2013   AST 36 07/14/2015   AST 26 10/14/2013   ALT 19 07/14/2015   ALT 31 10/14/2013  .  Micro Results Recent Results (from the past 240 hour(s))  MRSA PCR Screening     Status: None   Collection Time: 07/09/16 10:01 PM  Result Value Ref Range Status   MRSA by PCR NEGATIVE NEGATIVE Final    Comment:        The GeneXpert MRSA Assay (FDA approved for NASAL specimens only), is one component of a comprehensive MRSA colonization surveillance program. It is not intended to diagnose MRSA infection nor to guide or monitor treatment for MRSA infections.      Code Status History    Date Active Date Inactive Code Status Order ID Comments User Context   07/09/2016  7:50 PM 07/14/2016  2:49 PM Full Code [id number]  Lytle Butte, MD ED   04/16/2016 10:51 PM 04/18/2016  8:14 PM Full Code [id number]  Quintella Baton, MD Inpatient   05/02/2015  7:22 PM 05/04/2015  2:49 PM Full Code [id number]  Demetrios Loll, MD Inpatient   05/02/2015  3:32 PM 05/02/2015  7:22 PM Full Code [id number]  Dionisio David, MD ED          Follow-up Information    Nobie Putnam, MD. Schedule an appointment as soon as possible for a visit today.   Specialty:  Family Medicine Why:  Please call this office when you get home to establish care. Contact  information: [place] [address] [phone]        [name],[name], MD. Schedule an appointment as soon as possible for a visit in 10 day(s).   Specialties:  Vascular Surgery, Radiology, Interventional Cardiology Why:  av fistula eval Contact information: [place] [address] [phone]        hd center this thursday Follow up on 07/18/2016.   Why:  on thursday at 11 am Contact information: [facility] [address], [place], [place] [address]          Discharge Medications  Medication List    STOP taking these medications   potassium chloride 10 MEQ tablet Commonly known as:  K-DUR     TAKE these medications   amLODipine 10 MG tablet Commonly known as:  NORVASC Take 10 mg by mouth daily.   carvedilol 25 MG tablet Commonly known as:  COREG Take 25 mg by mouth 2 (two) times daily with a meal.   cloNIDine 0.2 MG tablet Commonly known as:  CATAPRES Take 1 tablet (0.2 mg total) by mouth 2 (two) times daily.   hydrALAZINE 50 MG tablet Commonly known as:  APRESOLINE Take 1 tablet (50 mg total) by mouth 3 (three) times daily.   losartan 100 MG tablet Commonly known as:  COZAAR Take 1 tablet (100 mg total) by mouth daily.   PAIN RELIEVER PO Take 1 tablet by mouth.   spironolactone 25 MG tablet Commonly known as:  ALDACTONE Take 1 tablet (25 mg total) by mouth daily.   traMADol 50 MG tablet Commonly known as:  ULTRAM Take 1 tablet (50 mg total) by mouth every 6 (six) hours as needed for moderate pain.          Total Time in preparing paper work, data evaluation and todays exam - 35 minutes  Dustin Flock M.D on 07/14/2016 at 3:50 PM  [facility]   Office  [phone]

## 2016-07-14 NOTE — Progress Notes (Signed)
[facility]  ROUNDING NOTE   Subjective:   Hemodialysis treatment yesterday. Tolerated fourth treatment well.  Blood pressure at goal yesterday but elevated today Hit his right clavicle on the bed rail and is 10/10 pain.   Objective:  Vital signs in last 24 hours:  Temp:  [97.9 F (36.6 C)-98.3 F (36.8 C)] 98.2 F (36.8 C) (08/27 0813) Pulse Rate:  [59-72] 63 (08/27 0813) Resp:  [16-23] 16 (08/27 0813) BP: (130-188)/(75-131) 174/106 (08/27 0813) SpO2:  [93 %-100 %] 93 % (08/27 0813) Weight:  [110 kg (242 lb 8.1 oz)-111.3 kg (245 lb 6 oz)] 110.4 kg (243 lb 6.4 oz) (08/26 1514)  Weight change: 1.3 kg (2 lb 13.9 oz) Filed Weights   07/13/16 1155 07/13/16 1430 07/13/16 1514  Weight: 111.3 kg (245 lb 6 oz) 110 kg (242 lb 8.1 oz) 110.4 kg (243 lb 6.4 oz)    Intake/Output: I/O last 3 completed shifts: In: 1080 [P.O.:1080] Out: 3628 [Urine:2850; Other:778]   Intake/Output this shift:  Total I/O In: 200 [P.O.:200] Out: 0   Physical Exam: General: NAD, sitting in chair  Head: Normocephalic, atraumatic. Moist oral mucosal membranes  Eyes: Anicteric, PERRL  Neck: Supple, trachea midline  Lungs:  Clear to auscultation  Heart: Regular rate and rhythm  Abdomen:  Soft, nontender,   Extremities: no peripheral edema.  Neurologic: Nonfocal, moving all four extremities  Skin: No lesions  Access: RIJ permcath, Dr. Lucky Cowboy [id number]    Basic Metabolic Panel:  Recent Labs Lab 07/09/16 1802 07/10/16 0441 07/10/16 1235 07/11/16 0416 07/12/16 0511 07/13/16 1155 07/14/16 0519  NA 131* 135  --  135 137 136 138  K 2.3* 2.5* 3.1* 2.8* 3.1* 3.3* 3.5  CL 92* 96*  --  97* 99* 101 101  CO2 29 28  --  28 29 29 30   GLUCOSE 126* 146*  --  104* 101* 114* 101*  BUN 53* 49*  --  52* 44* 39* 29*  CREATININE 6.01* 5.98*  --  5.99* 5.93* 5.44* 4.69*  CALCIUM 9.1 8.6*  --  8.5* 8.4* 8.6* 8.7*  MG 1.9  --   --  2.2 2.2  --   --   PHOS  --   --   --   --  3.9 3.4 4.3    Liver  Function Tests:  Recent Labs Lab 07/12/16 0511 07/13/16 1155 07/14/16 0519  ALBUMIN 2.9* 2.9* 3.0*   No results for input(s): LIPASE, AMYLASE in the last 168 hours. No results for input(s): AMMONIA in the last 168 hours.  CBC:  Recent Labs Lab 07/09/16 1802 07/10/16 0441 07/12/16 0511 07/13/16 1155  WBC 8.5 8.4 7.3 7.2  NEUTROABS  --   --  4.6  --   HGB 14.0 13.5 12.3* 12.7*  HCT 39.5* 38.9* 35.3* 37.2*  MCV 81.3 81.9 82.8 83.3  PLT 160 158 138* 165    Cardiac Enzymes:  Recent Labs Lab 07/09/16 1802  TROPONINI 0.10*    BNP: Invalid input(s): POCBNP  CBG:  Recent Labs Lab 07/09/16 2156  GLUCAP 163*    Microbiology: Results for orders placed or performed during the hospital encounter of 07/09/16  MRSA PCR Screening     Status: None   Collection Time: 07/09/16 10:01 PM  Result Value Ref Range Status   MRSA by PCR NEGATIVE NEGATIVE Final    Comment:        The GeneXpert MRSA Assay (FDA approved for NASAL specimens only), is one component of a comprehensive MRSA  colonization surveillance program. It is not intended to diagnose MRSA infection nor to guide or monitor treatment for MRSA infections.     Coagulation Studies: No results for input(s): LABPROT, INR in the last 72 hours.  Urinalysis: No results for input(s): COLORURINE, LABSPEC, PHURINE, GLUCOSEU, HGBUR, BILIRUBINUR, KETONESUR, PROTEINUR, UROBILINOGEN, NITRITE, LEUKOCYTESUR in the last 72 hours.  Invalid input(s): APPERANCEUR    Imaging: Dg Chest 2 View  Result Date: 07/12/2016 CLINICAL DATA:  Pulmonary tuberculosis, smoker EXAM: CHEST  2 VIEW COMPARISON:  04/16/2016 FINDINGS: Borderline cardiomegaly. There is right IJ dual-lumen catheter with tip in SVC right atrium junction. No pneumothorax. No infiltrate or pulmonary edema. Bony thorax is unremarkable. IMPRESSION: Borderline cardiomegaly. There is right IJ dual-lumen catheter with tip in SVC right atrium junction. No pneumothorax. No  infiltrate or pulmonary edema. Electronically Signed   By: Lahoma Crocker M.D.   On: 07/12/2016 14:45     Medications:     . amLODipine  10 mg Oral Daily  . carvedilol  25 mg Oral BID WC  . cloNIDine  0.2 mg Oral BID  . heparin  5,000 Units Subcutaneous Q8H  . hydrALAZINE  100 mg Oral TID  . ketorolac  30 mg Intravenous Once  . labetalol  20 mg Intravenous Once  . losartan  100 mg Oral Daily  . sodium chloride flush  3 mL Intravenous Q12H   acetaminophen **OR** acetaminophen, morphine injection, ondansetron **OR** ondansetron (ZOFRAN) IV, oxyCODONE  Assessment/ Plan:  [name] is a 30 y.o. black male with hypertension, childhood seizures, TIA who presents for Hypokalemia [E87.6] Demand ischemia ([place]) [I24.8] Acute on chronic renal failure ([place]) [N17.9, N18.9] Hypertensive emergency [I16.1]  1. End Stage Renal Disease: with nephrotic range proteinuria and anasarca: suspect pateint's renal failure is due to undiagnosed Focal Segmental Glomerulosclerosis seeing his quick progression of disease. Started on dialysis due to uncontrolled hypertension and uremic symptoms.  - RIJ permcath placed 8/24. First dialysis on 8/24. Completed four treatments.  - Outpatient planning: [facility] Starting 8/31.   2. Hypertension with urgency: elevated, seems to be pain related.  - Restarted on losartan, amlodipine, carvedilol, clonidine and hydralazine.  - Restart spironolactone and monitor due to renal failure - Hold furosemide due to hypokalemia and renal failure.   3. Hypokalemia: renal potassium wasting? From hypermineralcortisolism? magnesium at goal.  - Continue potassium replacement.  - 4K bath  4. Hyponatremia: sodium improved, secondary to renal failure.   5. Secondary Hyperaparathyroidism: PTH 101, phos and corrected calcium at goal.  - no indication for vitamin D agent or binders.     LOS: [place], Nataliya Graig 8/27/20179:31 AM

## 2016-07-17 ENCOUNTER — Encounter: Payer: Self-pay | Admitting: *Deleted

## 2016-07-19 ENCOUNTER — Ambulatory Visit (INDEPENDENT_AMBULATORY_CARE_PROVIDER_SITE_OTHER): Payer: 59 | Admitting: Family Medicine

## 2016-07-19 ENCOUNTER — Encounter: Payer: Self-pay | Admitting: Family Medicine

## 2016-07-19 VITALS — BP 152/93 | HR 64 | Temp 97.8°F | Resp 16 | Ht 73.0 in | Wt 254.8 lb

## 2016-07-19 DIAGNOSIS — I110 Hypertensive heart disease with heart failure: Secondary | ICD-10-CM | POA: Insufficient documentation

## 2016-07-19 DIAGNOSIS — M62830 Muscle spasm of back: Secondary | ICD-10-CM | POA: Diagnosis not present

## 2016-07-19 DIAGNOSIS — N186 End stage renal disease: Secondary | ICD-10-CM | POA: Diagnosis not present

## 2016-07-19 DIAGNOSIS — I10 Essential (primary) hypertension: Secondary | ICD-10-CM | POA: Diagnosis not present

## 2016-07-19 DIAGNOSIS — I5030 Unspecified diastolic (congestive) heart failure: Secondary | ICD-10-CM | POA: Diagnosis not present

## 2016-07-19 DIAGNOSIS — M940 Chondrocostal junction syndrome [Tietze]: Secondary | ICD-10-CM | POA: Diagnosis not present

## 2016-07-19 DIAGNOSIS — F419 Anxiety disorder, unspecified: Secondary | ICD-10-CM | POA: Diagnosis not present

## 2016-07-19 DIAGNOSIS — Z992 Dependence on renal dialysis: Secondary | ICD-10-CM | POA: Diagnosis not present

## 2016-07-19 MED ORDER — CYCLOBENZAPRINE HCL 10 MG PO TABS: 5.0000 mg | ORAL_TABLET | Freq: Three times a day (TID) | ORAL | 1 refills | Status: DC | PRN

## 2016-07-19 MED ORDER — ASPIRIN 81 MG PO TABS: 81.0000 mg | ORAL_TABLET | Freq: Every day | ORAL | Status: DC

## 2016-07-19 NOTE — Assessment & Plan Note (Addendum)
History most suggestive of MSK / inflammatory etiology for intermittent atypical CPs possibly related as well to physical job and lifting, none actively. Possibly related to now progressive ESRD, no evidence of uremia or pericarditis. Unable to take NSAIDs. - Prior [facility] 04/2015 without any evidence of coronary artery disease   Plan: 1. Start more conservative measures with regular Tylenol, avoid all NSAIDs, heating pad / ice packs, modify activities (limited by physical job) 2. Start rx Flexeril 5-10mg  PRN pain 3. Discussed alternative options - unable to tolerate tramadol

## 2016-07-19 NOTE — Assessment & Plan Note (Signed)
Secondary to uncontrolled chronic HTN, last ECHO 03/2016, LVEF 65%, severe LVH, grade 1 diastolic dysfunction  Plan: 1. Referral to re-establish with Cardiology [facility]) 2. Start ASA 81mg  daily 3. Continue current anti-HTN / CHF regimen

## 2016-07-19 NOTE — Assessment & Plan Note (Addendum)
Progressive to now ESRD on HD, started 8/24. Secondary to chronic uncontrolled HTN, prior undiagnosed FSGS Followed by Nephrology, Dr Juleen China ([facility]). Fluid restriction 1200cc / 40 oz  Plan: 1. Continue HD Tues/Thurs/Sat 2. Follow-up with Nephrology as planned 3. Continue anti-HTN regimen

## 2016-07-19 NOTE — Progress Notes (Signed)
Subjective:    Patient ID: Alan Gross, male    DOB: 31-Jul-1986, 30 y.o.   MRN: [id number]  Hari Wiggington is a 30 y.o. 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 - Recently he seems to be tolerating outpatient HD Tues / Thurs / Sat sessions, x 2 after discharge from hospital, last HD yesterday 07/18/16. He is on fluid restriction 1200 ml / 40 oz, admits to going over sometimes - Admits to still voiding / making urine - Admits to some feeling drained or sleepy with dialysis, occasional leg tingling - Admits intermittent bilateral lower ext edema, improved with elevation - Denies any lightheadedness, nausea, vomiting, numbness, dysuria, unable to void, confusion  CHRONIC HTN: Reports chronic history of HTN for several years, had previously been rx anti-HTN meds including coreg, clonidine, hydralazine but did not take consistently, frequent missed doses up to 1 month at a time, unsure of prior dx HTN before this. Current Meds - since hospital discharge on Amlodipine, coreg, clonidine, hydralazine, losartan, spironolactone Reports good compliance, took meds today. Tolerating well, w/o complaints. 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 / HTN: - Previously followed by Cardiology [facility], Dr Clayborn Bigness) >2 years ago, reports prior  history of multiple abnormal EKGs, states there was concern for heart attack / ischemia, has had x 2 cardiac catheterizations, last [facility] 04/2015 (Dr Humphrey Rolls) with normal coronaries and normal LVEF, but severe LVH - See above anti-HTN meds - Additionally, describes chronic chest pains, sharp stabbing pain on left upper chest and rib radiates to his back, not triggered by exertion, same pain intermittently over past 2 years, improved with manual pressure or massage, has tried generic pain reliever occasionally. Last episodes during hemodialysis and in hospital, has gone up to weeks to month without any pain. - No longer taking Tylenol or NSAID. Previously has taken ASA 81, none currently. - Unable to take Tramadol, makes him sick and feel drained. Has tolerated a muscle relaxant - Denies active chest pain or dyspnea today, night-time awakening with CP/SOB - Admits to some exertional dyspnea, without any worsening   Anxiety - Chronic anxiety, now worsened with recent hospitalization and new diagnosis ESRD, concerned about possibly not being able to work, and sitting in the hospital. Now seems to be doing better. Has taken a non-prescribed xanax once before, no other anti-anxiety or anti-depressant medications in the past. - Denies problem with depression or sadness. 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 Anxiety   . Depression   . H/O cardiac catheterization   . Hypertension   . Irregular heart beats   . Kidney failure    15 %   . Seizures ([place])    when he was a  child  . Stroke (cerebrum) [facility])    Social History   Social History  . Marital status: Married    Spouse name: N/A  . Number of children: N/A  . Years of education: N/A   Occupational History  . Not on file.   Social History Main Topics  .  Smoking status: Former Smoker    Packs/day: 1.00    Years: 10.00    Types: Cigarettes    Quit date: 06/22/2016  . Smokeless tobacco: Former Systems developer    Quit date: 05/02/2015  . Alcohol use 2.4 oz/week    4 Cans of beer per week     Comment: occas.   . Drug use:     Types: Marijuana  . Sexual activity: Not on file   Other Topics Concern  . Not on file   Social History Narrative  . No narrative on file   Family History  Problem Relation Age of Onset  . Hypertension Other   . Cancer Father     liver  . Hypertension Brother   . Diabetes Paternal Aunt   . Diabetes Paternal Uncle    Current Outpatient Prescriptions on File Prior to Visit  Medication Sig  . amLODipine (NORVASC) 10 MG tablet Take 10 mg by mouth daily.  . carvedilol (COREG) 25 MG tablet Take 25 mg by mouth 2 (two) times daily with a meal.  . cloNIDine (CATAPRES) 0.2 MG tablet Take 1 tablet (0.2 mg total) by mouth 2 (two) times daily.  . hydrALAZINE (APRESOLINE) 50 MG tablet Take 1 tablet (50 mg total) by mouth 3 (three) times daily.  Marland Kitchen losartan (COZAAR) 100 MG tablet Take 1 tablet (100 mg total) by mouth daily.  Marland Kitchen spironolactone (ALDACTONE) 25 MG tablet Take 1 tablet (25 mg total) by mouth daily.   No current facility-administered medications on file prior to visit.     Review of Systems  Hematological: Bruises/bleeds easily (one new bruise right flank/groin).   Per HPI unless specifically indicated above     Objective:    BP (!) 152/93 (BP Location: Right Arm, Patient Position: Sitting, Cuff Size: Large)   Pulse 64   Temp 97.8 F (36.6 C) (Oral)   Resp 16   Ht 6\' 1"  (1.854 m)   Wt 254 lb 12.8 oz (115.6 kg)   BMI 33.62 kg/m   Wt Readings from Last 3 Encounters:  07/19/16 254 lb 12.8 oz (115.6 kg)  07/13/16 243 lb 6.4 oz (110.4 kg)  04/16/16 245 lb (111.1 kg)    Physical Exam  Constitutional: He is oriented to person, place, and time. He appears well-developed and well-nourished. No distress.    Well-appearing, muscular build, comfortable, cooperative  HENT:  Head: Normocephalic and atraumatic.  Mouth/Throat: Oropharynx is clear and moist.  Eyes: Conjunctivae and EOM are normal. Pupils are equal, round, and reactive to light.  Neck: Normal range of motion. Neck supple. No thyromegaly present.  Cardiovascular: Normal rate, regular rhythm, normal heart sounds and intact distal pulses.   No murmur heard. Pulmonary/Chest: Effort normal and breath sounds normal. No respiratory distress. He has no wheezes. He has no rales. He exhibits no tenderness (non-reproducible left sided chest tenderness).  Right upper chest, RIJ permcath, appears normal without significant tenderness, erythema, swelling or sign of infection.  Abdominal: Soft. Bowel sounds are normal. He exhibits no distension and no mass. There is no tenderness.  Musculoskeletal: Normal range of motion. He exhibits edema (bilateral symmetrical +1 pitting edema lower ext entire lower  extr). He exhibits no tenderness.  Lymphadenopathy:    He has no cervical adenopathy.  Neurological: He is alert and oriented to person, place, and time.  Skin: Skin is warm and dry. He is not diaphoretic.  Small very mildly tender 2 cm ecchymosis R lower flank / upper inguinal, no significant hematoma or extending erythema  Psychiatric: He has a normal mood and affect. His behavior is normal. Thought content normal.  Nursing note and vitals reviewed.   Results for orders placed or performed during the hospital encounter of 07/09/16  MRSA PCR Screening  Result Value Ref Range   MRSA by PCR NEGATIVE NEGATIVE  Basic metabolic panel  Result Value Ref Range   Sodium 131 (L) 135 - 145 mmol/L   Potassium 2.3 (LL) 3.5 - 5.1 mmol/L   Chloride 92 (L) 101 - 111 mmol/L   CO2 29 22 - 32 mmol/L   Glucose, Bld 126 (H) 65 - 99 mg/dL   BUN 53 (H) 6 - 20 mg/dL   Creatinine, Ser 6.01 (H) 0.61 - 1.24 mg/dL   Calcium 9.1 8.9 - 10.3 mg/dL   GFR calc non Af Amer  11 (L) >60 mL/min   GFR calc Af Amer 13 (L) >60 mL/min   Anion gap 10 5 - 15  CBC  Result Value Ref Range   WBC 8.5 3.8 - 10.6 K/uL   RBC 4.86 4.40 - 5.90 MIL/uL   Hemoglobin 14.0 13.0 - 18.0 g/dL   HCT 39.5 (L) 40.0 - 52.0 %   MCV 81.3 80.0 - 100.0 fL   MCH 28.7 26.0 - 34.0 pg   MCHC 35.3 32.0 - 36.0 g/dL   RDW 14.8 (H) 11.5 - 14.5 %   Platelets 160 150 - 440 K/uL  Troponin I  Result Value Ref Range   Troponin I 0.10 (HH) <0.03 ng/mL  Magnesium  Result Value Ref Range   Magnesium 1.9 1.7 - 2.4 mg/dL  Basic metabolic panel  Result Value Ref Range   Sodium 135 135 - 145 mmol/L   Potassium 2.5 (LL) 3.5 - 5.1 mmol/L   Chloride 96 (L) 101 - 111 mmol/L   CO2 28 22 - 32 mmol/L   Glucose, Bld 146 (H) 65 - 99 mg/dL   BUN 49 (H) 6 - 20 mg/dL   Creatinine, Ser 5.98 (H) 0.61 - 1.24 mg/dL   Calcium 8.6 (L) 8.9 - 10.3 mg/dL   GFR calc non Af Amer 11 (L) >60 mL/min   GFR calc Af Amer 13 (L) >60 mL/min   Anion gap 11 5 - 15  CBC  Result Value Ref Range   WBC 8.4 3.8 - 10.6 K/uL   RBC 4.75 4.40 - 5.90 MIL/uL   Hemoglobin 13.5 13.0 - 18.0 g/dL   HCT 38.9 (L) 40.0 - 52.0 %   MCV 81.9 80.0 - 100.0 fL   MCH 28.4 26.0 - 34.0 pg   MCHC 34.7 32.0 - 36.0 g/dL   RDW 14.6 (H) 11.5 - 14.5 %   Platelets 158 150 - 440 K/uL  Glucose, capillary  Result Value Ref Range   Glucose-Capillary 163 (H) 65 - 99 mg/dL  Potassium  Result Value Ref Range   Potassium 3.1 (L) 3.5 - 5.1 mmol/L  Hepatitis B surface antibody  Result Value Ref Range   Hep B S Ab Non Reactive   Hepatitis B core antibody, IgM  Result Value Ref Range   Hep B C IgM Negative Negative  Hepatitis B surface antigen  Result Value Ref Range   Hepatitis B Surface Ag Negative Negative  Hepatitis C antibody  Result Value Ref Range   HCV Ab <0.1 0.0 - 0.9 s/co ratio  Basic metabolic panel  Result Value Ref Range   Sodium 135 135 - 145 mmol/L   Potassium 2.8 (L) 3.5 - 5.1 mmol/L   Chloride 97 (L) 101 - 111 mmol/L   CO2 28 22 -  32 mmol/L   Glucose, Bld 104 (H) 65 - 99 mg/dL   BUN 52 (H) 6 - 20 mg/dL   Creatinine, Ser 5.99 (H) 0.61 - 1.24 mg/dL   Calcium 8.5 (L) 8.9 - 10.3 mg/dL   GFR calc non Af Amer 11 (L) >60 mL/min   GFR calc Af Amer 13 (L) >60 mL/min   Anion gap 10 5 - 15  Magnesium  Result Value Ref Range   Magnesium 2.2 1.7 - 2.4 mg/dL  Parathyroid hormone, intact (no Ca)  Result Value Ref Range   PTH 101 (H) 15 - 65 pg/mL  CBC  Result Value Ref Range   WBC 7.3 3.8 - 10.6 K/uL   RBC 4.27 (L) 4.40 - 5.90 MIL/uL   Hemoglobin 12.3 (L) 13.0 - 18.0 g/dL   HCT 35.3 (L) 40.0 - 52.0 %   MCV 82.8 80.0 - 100.0 fL   MCH 28.8 26.0 - 34.0 pg   MCHC 34.8 32.0 - 36.0 g/dL   RDW 14.7 (H) 11.5 - 14.5 %   Platelets 138 (L) 150 - 440 K/uL  Differential  Result Value Ref Range   Neutrophils Relative % 62 %   Neutro Abs 4.6 1.4 - 6.5 K/uL   Lymphocytes Relative 20 %   Lymphs Abs 1.5 1.0 - 3.6 K/uL   Monocytes Relative 14 %   Monocytes Absolute 1.0 0.2 - 1.0 K/uL   Eosinophils Relative 3 %   Eosinophils Absolute 0.2 0 - 0.7 K/uL   Basophils Relative 1 %   Basophils Absolute 0.0 0 - 0.1 K/uL  Renal function panel  Result Value Ref Range   Sodium 137 135 - 145 mmol/L   Potassium 3.1 (L) 3.5 - 5.1 mmol/L   Chloride 99 (L) 101 - 111 mmol/L   CO2 29 22 - 32 mmol/L   Glucose, Bld 101 (H) 65 - 99 mg/dL   BUN 44 (H) 6 - 20 mg/dL   Creatinine, Ser 5.93 (H) 0.61 - 1.24 mg/dL   Calcium 8.4 (L) 8.9 - 10.3 mg/dL   Phosphorus 3.9 2.5 - 4.6 mg/dL   Albumin 2.9 (L) 3.5 - 5.0 g/dL   GFR calc non Af Amer 11 (L) >60 mL/min   GFR calc Af Amer 13 (L) >60 mL/min   Anion gap 9 5 - 15  Hepatitis B surface antigen  Result Value Ref Range   Hepatitis B Surface Ag Negative Negative  Hepatitis B surface antibody  Result Value Ref Range   Hep B S Ab Non Reactive   Hepatitis B core antibody, total  Result Value Ref Range   Hep B Core Total Ab Negative Negative  Magnesium  Result Value Ref Range   Magnesium 2.2 1.7 - 2.4  mg/dL  Renal function panel  Result Value Ref Range   Sodium 136 135 - 145 mmol/L   Potassium 3.3 (L) 3.5 - 5.1 mmol/L   Chloride 101 101 - 111 mmol/L   CO2 29 22 - 32 mmol/L   Glucose, Bld 114 (H) 65 - 99 mg/dL   BUN 39 (  H) 6 - 20 mg/dL   Creatinine, Ser 5.44 (H) 0.61 - 1.24 mg/dL   Calcium 8.6 (L) 8.9 - 10.3 mg/dL   Phosphorus 3.4 2.5 - 4.6 mg/dL   Albumin 2.9 (L) 3.5 - 5.0 g/dL   GFR calc non Af Amer 13 (L) >60 mL/min   GFR calc Af Amer 15 (L) >60 mL/min   Anion gap 6 5 - 15  CBC  Result Value Ref Range   WBC 7.2 3.8 - 10.6 K/uL   RBC 4.47 4.40 - 5.90 MIL/uL   Hemoglobin 12.7 (L) 13.0 - 18.0 g/dL   HCT 37.2 (L) 40.0 - 52.0 %   MCV 83.3 80.0 - 100.0 fL   MCH 28.3 26.0 - 34.0 pg   MCHC 34.0 32.0 - 36.0 g/dL   RDW 14.9 (H) 11.5 - 14.5 %   Platelets 165 150 - 440 K/uL  Renal function panel  Result Value Ref Range   Sodium 138 135 - 145 mmol/L   Potassium 3.5 3.5 - 5.1 mmol/L   Chloride 101 101 - 111 mmol/L   CO2 30 22 - 32 mmol/L   Glucose, Bld 101 (H) 65 - 99 mg/dL   BUN 29 (H) 6 - 20 mg/dL   Creatinine, Ser 4.69 (H) 0.61 - 1.24 mg/dL   Calcium 8.7 (L) 8.9 - 10.3 mg/dL   Phosphorus 4.3 2.5 - 4.6 mg/dL   Albumin 3.0 (L) 3.5 - 5.0 g/dL   GFR calc non Af Amer 15 (L) >60 mL/min   GFR calc Af Amer 18 (L) >60 mL/min   Anion gap 7 5 - 15      Assessment & Plan:   Problem List Items Addressed This Visit    HTN (hypertension) - Primary    Chronic uncontrolled HTN with prior non-adherence to meds, recent hospitalization for HTN crisis, now diagnosed progress renal failure ESRD on HD, with HTN as likely etiology - [place]  Plan: 1. Continue Amlodipine 10mg  daily, Carvedilol 25mg  BID, Clonidine 0.2mg  BID, Losartan 100mg  daily, Hydralazine 50mg  TID, Spironolactone 25mg  daily 2. Remains off Lasix and K supplement 3. Referral back to Cardiology 4. Monitor BP outside clinic 5. Follow-up 1 month for HTN, given new start HD may need to continue adjusting regimen      Relevant  Medications   aspirin 81 MG tablet   Other Relevant Orders   Ambulatory referral to Cardiology   Heart failure, diastolic, due to HTN ([place])    Secondary to uncontrolled chronic HTN, last ECHO 03/2016, LVEF 65%, severe LVH, grade 1 diastolic dysfunction  Plan: 1. Referral to re-establish with Cardiology [facility] Dr Clayborn Bigness) 2. Start ASA 81mg  daily 3. Continue current anti-HTN / CHF regimen      Relevant Medications   aspirin 81 MG tablet   Other Relevant Orders   Ambulatory referral to Cardiology   ESRD (end stage renal disease) on dialysis [facility])    Progressive to now ESRD on HD, started 8/24. Secondary to chronic uncontrolled HTN, prior undiagnosed FSGS Followed by Nephrology, Dr Juleen China ([facility]). Fluid restriction 1200cc / 40 oz  Plan: 1. Continue HD Tues/Thurs/Sat 2. Follow-up with Nephrology as planned 3. Continue anti-HTN regimen      Costochondritis    History most suggestive of MSK / inflammatory etiology for intermittent atypical CPs possibly related as well to physical job and lifting, none actively. Possibly related to now progressive ESRD, no evidence of uremia or pericarditis. Unable to take NSAIDs. - Prior [facility] 04/2015 without any evidence of  coronary artery disease   Plan: 1. Start more conservative measures with regular Tylenol, avoid all NSAIDs, heating pad / ice packs, modify activities (limited by physical job) 2. Start rx Flexeril 5-10mg  PRN pain 3. Discussed alternative options - unable to tolerate tramadol      Relevant Medications   cyclobenzaprine (FLEXERIL) 10 MG tablet   Anxiety    Consistent with recent worsening due to adjustment anxiety with recent hospitalization. Now improving. No prior medications for anxiety / depression.  Plan: 1. Reassurance, discussion of current treatment and mutual decision to not start any new medications or treatments for anxiety today       Other Visit Diagnoses    Muscle spasm of back       Relevant  Medications   cyclobenzaprine (FLEXERIL) 10 MG tablet      Meds ordered this encounter  Medications  . aspirin 81 MG tablet    Sig: Take 1 tablet (81 mg total) by mouth daily.    Dispense:  30 tablet  . cyclobenzaprine (FLEXERIL) 10 MG tablet    Sig: Take 0.5-1 tablets (5-10 mg total) by mouth 3 (three) times daily as needed for muscle spasms.    Dispense:  30 tablet    Refill:  1      Follow up plan: Return in about 1 month (around 08/18/2016) for blood pressure, anxiety, ESRD.  Nobie Putnam, [facility] 07/19/2016, 4:48 PM

## 2016-07-19 NOTE — Patient Instructions (Signed)
Thank you for coming in to clinic today.  1. Referral sent to Cardiology - [facility] Dr Clayborn Bigness, to re-establish care  2. For muscle spasms and pain  Recommend to start taking Tylenol Extra Strength 500mg  tabs - take 1 to 2 tabs (max 1000mg  per dose) every 6 hours for pain (take regularly, don't skip a dose for next 3-7 days), max 24 hour daily dose is 6 to 8 tablets or 3000 to 4000mg   Start Cyclobenzapine (Flexeril) 10mg  tablets (muscle relaxant) - cut in half for 5mg  at night for muscle relaxant - may make you sedated or sleepy (be careful driving or working on this) if tolerated you can take half to whole tab 2 to 3 times daily or every 8 hours as needed  Use heating pad / ice packs / icy hot as well - these are very safe options  3. For Kidneys, follow-up with hemodialysis, good job so far, it sounds like you are doing your best and this will take some time for your body to adjust  The bruise will heal, may use ice pack on it  Please schedule a follow-up appointment with Dr. Parks Ranger in 1 to 3 months to follow-up HTN, ESRD, Anxiety - After you have seen Kidney and Heart Doctors  If you have any other questions or concerns, please feel free to call the clinic or send a message through [place]. You may also schedule an earlier appointment if necessary.  Nobie Putnam, DO [place]

## 2016-07-19 NOTE — Assessment & Plan Note (Signed)
Chronic uncontrolled HTN with prior non-adherence to meds, recent hospitalization for HTN crisis, now diagnosed progress renal failure ESRD on HD, with HTN as likely etiology - [place]  Plan: 1. Continue Amlodipine 10mg  daily, Carvedilol 25mg  BID, Clonidine 0.2mg  BID, Losartan 100mg  daily, Hydralazine 50mg  TID, Spironolactone 25mg  daily 2. Remains off Lasix and K supplement 3. Referral back to Cardiology 4. Monitor BP outside clinic 5. Follow-up 1 month for HTN, given new start HD may need to continue adjusting regimen

## 2016-07-19 NOTE — Assessment & Plan Note (Signed)
Consistent with recent worsening due to adjustment anxiety with recent hospitalization. Now improving. No prior medications for anxiety / depression.  Plan: 1. Reassurance, discussion of current treatment and mutual decision to not start any new medications or treatments for anxiety today

## 2016-08-16 ENCOUNTER — Other Ambulatory Visit: Payer: Self-pay | Admitting: Vascular Surgery

## 2016-08-16 DIAGNOSIS — Z01810 Encounter for preprocedural cardiovascular examination: Secondary | ICD-10-CM

## 2016-08-16 DIAGNOSIS — N186 End stage renal disease: Secondary | ICD-10-CM

## 2016-08-26 ENCOUNTER — Other Ambulatory Visit: Payer: Self-pay

## 2016-08-29 ENCOUNTER — Encounter: Payer: Self-pay | Admitting: Vascular Surgery

## 2016-09-03 ENCOUNTER — Ambulatory Visit (INDEPENDENT_AMBULATORY_CARE_PROVIDER_SITE_OTHER)
Admission: RE | Admit: 2016-09-03 | Discharge: 2016-09-03 | Disposition: A | Discharge status: Home / Self Care | Payer: 59 | Source: Ambulatory Visit | Attending: Vascular Surgery | Admitting: Vascular Surgery

## 2016-09-03 ENCOUNTER — Ambulatory Visit (HOSPITAL_COMMUNITY)
Admission: RE | Admit: 2016-09-03 | Discharge: 2016-09-03 | Disposition: A | Discharge status: Home / Self Care | Payer: 59 | Source: Ambulatory Visit | Attending: Vascular Surgery | Admitting: Vascular Surgery

## 2016-09-03 ENCOUNTER — Ambulatory Visit (INDEPENDENT_AMBULATORY_CARE_PROVIDER_SITE_OTHER): Payer: 59 | Admitting: Vascular Surgery

## 2016-09-03 ENCOUNTER — Encounter: Payer: Self-pay | Admitting: Vascular Surgery

## 2016-09-03 VITALS — BP 156/113 | HR 59 | Temp 98.2°F | Resp 18 | Ht 73.0 in | Wt 258.4 lb

## 2016-09-03 DIAGNOSIS — N186 End stage renal disease: Secondary | ICD-10-CM | POA: Insufficient documentation

## 2016-09-03 DIAGNOSIS — Z01810 Encounter for preprocedural cardiovascular examination: Secondary | ICD-10-CM | POA: Insufficient documentation

## 2016-09-03 DIAGNOSIS — Z992 Dependence on renal dialysis: Secondary | ICD-10-CM

## 2016-09-03 NOTE — Progress Notes (Signed)
Vitals:   09/03/16 0922 09/03/16 0925  BP: (!) 158/110 (!) 156/113  Pulse: (!) 59   Resp: 18   Temp: 98.2 F (36.8 C)   TempSrc: Oral   SpO2: 100%   Weight: 258 lb 6.4 oz (117.2 kg)   Height: 6\' 1"  (1.854 m)

## 2016-09-03 NOTE — Progress Notes (Signed)
Vascular and Vein Specialist of [facility]  Patient name: Alan Gross MRN: [id number] DOB: [birth year] Sex: male  REASON FOR CONSULT: Hemodialysis access  HPI: Alan Gross is a 30 y.o. male, who is day for discussion of hemodialysis access. He is currently being dialyzed via a right IJ catheter. His had no difficulty with this. He does work Architect work with [employer] and is very difficult due to this with a hemodialysis schedule is with his catheter.  Past Medical History:  Diagnosis Date  . Anxiety   . Depression   . H/O cardiac catheterization   . Hypertension   . Irregular heart beats   . Kidney failure    15 %   . Seizures ([place])    when he was a child  . Stroke (cerebrum) [facility])     Family History  Problem Relation Age of Onset  . Hypertension Other   . Cancer Father     liver  . Hypertension Brother   . Diabetes Paternal Aunt   . Diabetes Paternal Uncle     SOCIAL HISTORY: Social History   Social History  . Marital status: Married    Spouse name: N/A  . Number of children: N/A  . Years of education: N/A   Occupational History  . Not on file.   Social History Main Topics  . Smoking status: Former Smoker    Packs/day: 1.00    Years: 10.00    Types: Cigarettes    Quit date: 06/22/2016  . Smokeless tobacco: Former Systems developer    Quit date: 05/02/2015  . Alcohol use Yes     Comment: occas.   . Drug use:     Types: Marijuana  . Sexual activity: Not on file   Other Topics Concern  . Not on file   Social History Narrative  . No narrative on file    No Known Allergies  Current Outpatient Prescriptions  Medication Sig Dispense Refill  . amLODipine (NORVASC) 10 MG tablet Take 10 mg by mouth daily.    . carvedilol (COREG) 25 MG tablet Take 25 mg by mouth 2 (two) times daily with a meal.    . cloNIDine (CATAPRES) 0.2 MG tablet Take 1 tablet (0.2 mg total) by mouth 2 (two) times daily. 60 tablet 0  . hydrALAZINE  (APRESOLINE) 50 MG tablet Take 1 tablet (50 mg total) by mouth 3 (three) times daily. 90 tablet 0  . losartan (COZAAR) 100 MG tablet Take 1 tablet (100 mg total) by mouth daily. 30 tablet 0  . spironolactone (ALDACTONE) 25 MG tablet Take 1 tablet (25 mg total) by mouth daily. 30 tablet 0  . aspirin 81 MG tablet Take 1 tablet (81 mg total) by mouth daily. (Patient not taking: Reported on 09/03/2016) 30 tablet   . cyclobenzaprine (FLEXERIL) 10 MG tablet Take 0.5-1 tablets (5-10 mg total) by mouth 3 (three) times daily as needed for muscle spasms. (Patient not taking: Reported on 09/03/2016) 30 tablet 1   No current facility-administered medications for this visit.     REVIEW OF SYSTEMS:  [X]  denotes positive finding, [ ]  denotes negative finding Cardiac  Comments:  Chest pain or chest pressure:    Shortness of breath upon exertion:    Short of breath when lying flat:    Irregular heart rhythm:        Vascular    Pain in calf, thigh, or hip brought on by ambulation:    Pain in feet at night that wakes you  up from your sleep:     Blood clot in your veins:    Leg swelling:         Pulmonary    Oxygen at home:    Productive cough:     Wheezing:         Neurologic    Sudden weakness in arms or legs:     Sudden numbness in arms or legs:     Sudden onset of difficulty speaking or slurred speech:    Temporary loss of vision in one eye:     Problems with dizziness:         Gastrointestinal    Blood in stool:     Vomited blood:         Genitourinary    Burning when urinating:     Blood in urine:        Psychiatric    Major depression:         Hematologic    Bleeding problems:    Problems with blood clotting too easily:        Skin    Rashes or ulcers:        Constitutional    Fever or chills:      PHYSICAL EXAM: Vitals:   09/03/16 0922 09/03/16 0925  BP: (!) 158/110 (!) 156/113  Pulse: (!) 59   Resp: 18   Temp: 98.2 F (36.8 C)   TempSrc: Oral   SpO2: 100%     Weight: 258 lb 6.4 oz (117.2 kg)   Height: 6\' 1"  (1.854 m)     GENERAL: The patient is a well-nourished male, in no acute distress. The vital signs are documented above. CARDIOVASCULAR: Radial pulses bilaterally. Does have visible cephalic vein at the wrist bilaterally PULMONARY: There is good air exchange  ABDOMEN: Soft and non-tender  MUSCULOSKELETAL: There are no major deformities or cyanosis. NEUROLOGIC: No focal weakness or paresthesias are detected. SKIN: There are no ulcers or rashes noted. PSYCHIATRIC: The patient has a normal affect.  DATA:  Duplex reveals adequate the cephalic vein in the left wrist for attempted fistula creation  MEDICAL ISSUES: Discussed options. Have recommended left AV fistula creation since he is right hand dominant. He currently undergoes hemodialysis on Monday Wednesday Friday. Scheduled this electively at his convenience on a Tuesday or Thursday   Rosetta Posner, MD [facility] Vascular and Vein Specialists of [facility] Tel [phone] Pager [phone]

## 2016-09-04 ENCOUNTER — Other Ambulatory Visit: Payer: Self-pay

## 2016-09-04 ENCOUNTER — Encounter: Payer: Self-pay | Admitting: Family Medicine

## 2016-09-07 ENCOUNTER — Encounter (HOSPITAL_COMMUNITY): Payer: Self-pay | Admitting: *Deleted

## 2016-09-07 NOTE — Progress Notes (Signed)
Patient reports chest pain 2 days ago that was similar to what he was evaluated for by Dr. Donald Pore for in September. He reports intermittent shortness of breath. He also reported that sometimes when he stands up he gets dizzy, hot, feels like he is going to fall, and has to sit back down to keep from falling. Left message for Anesthesia PA to review. Request that patient refrain from marijuana use for at least 2 days prior to surgery. Patient verbalized understanding of same.

## 2016-09-09 NOTE — Progress Notes (Signed)
Anesthesia Chart Review:  Pt is a same day work up.   Pt is a 30 year old male scheduled for L AV fistula creation on 09/10/2016 with Adele Barthel, MD.   - PCP is Nobie Putnam, MD.  - Nephrologist at Dr. Juleen China at [facility] includes:  HTN, irregular heart beat, stroke, ESRD on dialysis.  Former smoker. BMI 34  BP was 158/110 and 156/113 on recheck at vascular surgery appointment 09/03/16.  I spoke with pt by telephone while he was at dialysis. His BP today pre-dialysis was 156/70.  He took only carvedilol, clonidine and losartan this morning.   Pt hospitalized 8/22-8/27/17 for hypertensive emergency (260s/140s), acute on chronic renal failure (dialysis initiated).    Medications include: amlodipine, carvedilol, clonidine, losartan. Pt reports he is out of amlodipine.   Labs will be obtained DOS.   CXR 07/12/16: Borderline cardiomegaly. There is right IJ dual-lumen catheter with tip in SVC right atrium junction. No pneumothorax. No infiltrate or pulmonary edema.  EKG 07/11/16: Sinus rhythm. Probable left atrial enlargement. LVH with secondary repolarization abnormality. Anterior ST elevation, probably due to LVH. Borderline prolonged QT interval.  Echo 04/17/16:  - Left ventricle: The cavity size was normal. There was severe concentric hypertrophy. Systolic function was normal. The estimated ejection fraction was 65%. Wall motion was normal; there were no regional wall motion abnormalities. Doppler parameters are consistent with abnormal left ventricular relaxation (grade 1 diastolic dysfunction). - Aortic valve: Valve area (Vmax): 2.6 cm^2. - Mitral valve: There was mild regurgitation. - Left atrium: The atrium was mildly dilated. - Right ventricle: The cavity size was mildly dilated. - Right atrium: The atrium was mildly dilated. - Atrial septum: No defect or patent foramen ovale was identified. - Impressions: Normal LVEF with normal wall motion but severe LVH,  and mild diastolic dysfunction.  Cardiac cath 05/02/15: normal coronaries.   If BP and labs acceptable DOS, I anticipate pt can proceed as scheduled.   Willeen Cass, FNP-BC [facility]/Anesthesiology Phone: [phone] 09/09/2016 1:45 PM

## 2016-09-10 ENCOUNTER — Encounter (HOSPITAL_COMMUNITY): Payer: Self-pay | Admitting: Certified Registered"

## 2016-09-10 ENCOUNTER — Encounter (HOSPITAL_COMMUNITY)
Admission: RE | Disposition: A | Discharge status: Home / Self Care | Payer: Self-pay | Source: Ambulatory Visit | Attending: Vascular Surgery

## 2016-09-10 ENCOUNTER — Ambulatory Visit (HOSPITAL_COMMUNITY): Payer: 59 | Admitting: Emergency Medicine

## 2016-09-10 ENCOUNTER — Ambulatory Visit (HOSPITAL_COMMUNITY)
Admission: RE | Admit: 2016-09-10 | Discharge: 2016-09-10 | Disposition: A | Discharge status: Home / Self Care | Payer: 59 | Source: Ambulatory Visit | Attending: Vascular Surgery | Admitting: Vascular Surgery

## 2016-09-10 DIAGNOSIS — Z8249 Family history of ischemic heart disease and other diseases of the circulatory system: Secondary | ICD-10-CM | POA: Diagnosis not present

## 2016-09-10 DIAGNOSIS — N186 End stage renal disease: Secondary | ICD-10-CM | POA: Insufficient documentation

## 2016-09-10 DIAGNOSIS — Z833 Family history of diabetes mellitus: Secondary | ICD-10-CM | POA: Diagnosis not present

## 2016-09-10 DIAGNOSIS — I2510 Atherosclerotic heart disease of native coronary artery without angina pectoris: Secondary | ICD-10-CM | POA: Diagnosis not present

## 2016-09-10 DIAGNOSIS — F329 Major depressive disorder, single episode, unspecified: Secondary | ICD-10-CM | POA: Insufficient documentation

## 2016-09-10 DIAGNOSIS — I120 Hypertensive chronic kidney disease with stage 5 chronic kidney disease or end stage renal disease: Secondary | ICD-10-CM | POA: Insufficient documentation

## 2016-09-10 DIAGNOSIS — Z87891 Personal history of nicotine dependence: Secondary | ICD-10-CM | POA: Insufficient documentation

## 2016-09-10 DIAGNOSIS — Z800 Family history of malignant neoplasm of digestive organs: Secondary | ICD-10-CM | POA: Diagnosis not present

## 2016-09-10 DIAGNOSIS — Z8673 Personal history of transient ischemic attack (TIA), and cerebral infarction without residual deficits: Secondary | ICD-10-CM | POA: Diagnosis not present

## 2016-09-10 DIAGNOSIS — N185 Chronic kidney disease, stage 5: Secondary | ICD-10-CM | POA: Diagnosis not present

## 2016-09-10 DIAGNOSIS — F419 Anxiety disorder, unspecified: Secondary | ICD-10-CM | POA: Diagnosis not present

## 2016-09-10 DIAGNOSIS — Z992 Dependence on renal dialysis: Secondary | ICD-10-CM | POA: Insufficient documentation

## 2016-09-10 DIAGNOSIS — I499 Cardiac arrhythmia, unspecified: Secondary | ICD-10-CM | POA: Insufficient documentation

## 2016-09-10 DIAGNOSIS — Z7982 Long term (current) use of aspirin: Secondary | ICD-10-CM | POA: Diagnosis not present

## 2016-09-10 HISTORY — DX: Dyspnea, unspecified: R06.00

## 2016-09-10 HISTORY — DX: Personal history of other specified conditions: Z87.898

## 2016-09-10 HISTORY — PX: AV FISTULA PLACEMENT: SHX1204

## 2016-09-10 LAB — POCT I-STAT 4, (NA,K, GLUC, HGB,HCT)
GLUCOSE: 113 mg/dL — AB (ref 65–99)
HCT: 38 % — ABNORMAL LOW (ref 39.0–52.0)
HEMOGLOBIN: 12.9 g/dL — AB (ref 13.0–17.0)
POTASSIUM: 3.6 mmol/L (ref 3.5–5.1)
SODIUM: 138 mmol/L (ref 135–145)

## 2016-09-10 SURGERY — ARTERIOVENOUS (AV) FISTULA CREATION
Anesthesia: Monitor Anesthesia Care | Site: Arm Lower | Laterality: Left

## 2016-09-10 MED ORDER — OXYCODONE-ACETAMINOPHEN 5-325 MG PO TABS
ORAL_TABLET | ORAL | Status: AC
  Filled 2016-09-10: qty 1

## 2016-09-10 MED ORDER — MIDAZOLAM HCL 5 MG/5ML IJ SOLN
INTRAMUSCULAR | Status: DC | PRN
Start: 2016-09-10 — End: 2016-09-10
  Administered 2016-09-10: 2 mg via INTRAVENOUS

## 2016-09-10 MED ORDER — CHLORHEXIDINE GLUCONATE CLOTH 2 % EX PADS: 6.0000 | MEDICATED_PAD | Freq: Once | CUTANEOUS | Status: DC

## 2016-09-10 MED ORDER — PHENYLEPHRINE HCL 10 MG/ML IJ SOLN
INTRAVENOUS | Status: DC | PRN
  Administered 2016-09-10: 50 ug/min via INTRAVENOUS

## 2016-09-10 MED ORDER — SODIUM CHLORIDE 0.9 % IV SOLN
INTRAVENOUS | Status: DC
  Administered 2016-09-10: 08:00:00 via INTRAVENOUS

## 2016-09-10 MED ORDER — DEXTROSE 5 % IV SOLN
1.5000 g | INTRAVENOUS | Status: AC
  Administered 2016-09-10: 1.5 g via INTRAVENOUS
  Filled 2016-09-10: qty 1.5

## 2016-09-10 MED ORDER — LIDOCAINE HCL (PF) 1 % IJ SOLN
INTRAMUSCULAR | Status: DC | PRN
  Administered 2016-09-10: 4 mL

## 2016-09-10 MED ORDER — HEPARIN SODIUM (PORCINE) 5000 UNIT/ML IJ SOLN
INTRAMUSCULAR | Status: DC | PRN
  Administered 2016-09-10: 500 mL

## 2016-09-10 MED ORDER — FENTANYL CITRATE (PF) 100 MCG/2ML IJ SOLN
INTRAMUSCULAR | Status: DC | PRN
  Administered 2016-09-10: 50 ug via INTRAVENOUS

## 2016-09-10 MED ORDER — FENTANYL CITRATE (PF) 100 MCG/2ML IJ SOLN
INTRAMUSCULAR | Status: AC
  Filled 2016-09-10: qty 2

## 2016-09-10 MED ORDER — OXYCODONE-ACETAMINOPHEN 5-325 MG PO TABS: 1.0000 | ORAL_TABLET | Freq: Four times a day (QID) | ORAL | 0 refills | Status: DC | PRN

## 2016-09-10 MED ORDER — PROPOFOL 500 MG/50ML IV EMUL
INTRAVENOUS | Status: DC | PRN
  Administered 2016-09-10: 100 ug/kg/min via INTRAVENOUS
  Administered 2016-09-10: 10:00:00 via INTRAVENOUS

## 2016-09-10 MED ORDER — HYDROMORPHONE HCL 1 MG/ML IJ SOLN: 0.2500 mg | INTRAMUSCULAR | Status: DC | PRN

## 2016-09-10 MED ORDER — PROMETHAZINE HCL 25 MG/ML IJ SOLN: 6.2500 mg | INTRAMUSCULAR | Status: DC | PRN

## 2016-09-10 MED ORDER — LIDOCAINE 2% (20 MG/ML) 5 ML SYRINGE
INTRAMUSCULAR | Status: AC
  Filled 2016-09-10: qty 5

## 2016-09-10 MED ORDER — OXYCODONE-ACETAMINOPHEN 5-325 MG PO TABS
1.0000 | ORAL_TABLET | Freq: Once | ORAL | Status: AC
  Administered 2016-09-10: 1 via ORAL

## 2016-09-10 MED ORDER — PHENYLEPHRINE HCL 10 MG/ML IJ SOLN
INTRAMUSCULAR | Status: DC | PRN
  Administered 2016-09-10 (×3): 80 ug via INTRAVENOUS

## 2016-09-10 MED ORDER — LIDOCAINE HCL (CARDIAC) 20 MG/ML IV SOLN
INTRAVENOUS | Status: DC | PRN
  Administered 2016-09-10: 80 mg via INTRAVENOUS

## 2016-09-10 MED ORDER — PHENYLEPHRINE 40 MCG/ML (10ML) SYRINGE FOR IV PUSH (FOR BLOOD PRESSURE SUPPORT)
PREFILLED_SYRINGE | INTRAVENOUS | Status: AC
  Filled 2016-09-10: qty 10

## 2016-09-10 MED ORDER — MIDAZOLAM HCL 2 MG/2ML IJ SOLN
INTRAMUSCULAR | Status: AC
  Filled 2016-09-10: qty 2

## 2016-09-10 MED ORDER — 0.9 % SODIUM CHLORIDE (POUR BTL) OPTIME
TOPICAL | Status: DC | PRN
  Administered 2016-09-10: 1000 mL

## 2016-09-10 SURGICAL SUPPLY — 37 items
ARMBAND PINK RESTRICT EXTREMIT (MISCELLANEOUS) ×3 IMPLANT
CANISTER SUCTION 2500CC (MISCELLANEOUS) ×3 IMPLANT
CLIP TI MEDIUM 6 (CLIP) ×3 IMPLANT
CLIP TI WIDE RED SMALL 6 (CLIP) ×3 IMPLANT
COVER PROBE W GEL 5X96 (DRAPES) ×3 IMPLANT
DECANTER SPIKE VIAL GLASS SM (MISCELLANEOUS) IMPLANT
DERMABOND ADVANCED (GAUZE/BANDAGES/DRESSINGS) ×2
DERMABOND ADVANCED .7 DNX12 (GAUZE/BANDAGES/DRESSINGS) ×1 IMPLANT
ELECT REM PT RETURN 9FT ADLT (ELECTROSURGICAL) ×3
ELECTRODE REM PT RTRN 9FT ADLT (ELECTROSURGICAL) ×1 IMPLANT
GLOVE BIO SURGEON STRL SZ 6.5 (GLOVE) ×4 IMPLANT
GLOVE BIO SURGEON STRL SZ7 (GLOVE) ×3 IMPLANT
GLOVE BIO SURGEONS STRL SZ 6.5 (GLOVE) ×2
GLOVE BIOGEL PI IND STRL 6.5 (GLOVE) ×1 IMPLANT
GLOVE BIOGEL PI IND STRL 7.0 (GLOVE) ×1 IMPLANT
GLOVE BIOGEL PI IND STRL 7.5 (GLOVE) ×1 IMPLANT
GLOVE BIOGEL PI INDICATOR 6.5 (GLOVE) ×2
GLOVE BIOGEL PI INDICATOR 7.0 (GLOVE) ×2
GLOVE BIOGEL PI INDICATOR 7.5 (GLOVE) ×2
GLOVE SURG SS PI 7.0 STRL IVOR (GLOVE) ×6 IMPLANT
GOWN STRL REUS W/ TWL LRG LVL3 (GOWN DISPOSABLE) ×3 IMPLANT
GOWN STRL REUS W/ TWL XL LVL3 (GOWN DISPOSABLE) ×1 IMPLANT
GOWN STRL REUS W/TWL LRG LVL3 (GOWN DISPOSABLE) ×6
GOWN STRL REUS W/TWL XL LVL3 (GOWN DISPOSABLE) ×2
HEMOSTAT SPONGE AVITENE ULTRA (HEMOSTASIS) IMPLANT
KIT BASIN OR (CUSTOM PROCEDURE TRAY) ×3 IMPLANT
KIT ROOM TURNOVER OR (KITS) ×3 IMPLANT
NS IRRIG 1000ML POUR BTL (IV SOLUTION) ×3 IMPLANT
PACK CV ACCESS (CUSTOM PROCEDURE TRAY) ×3 IMPLANT
PAD ARMBOARD 7.5X6 YLW CONV (MISCELLANEOUS) ×6 IMPLANT
SUT MNCRL AB 4-0 PS2 18 (SUTURE) ×3 IMPLANT
SUT PROLENE 6 0 BV (SUTURE) IMPLANT
SUT PROLENE 7 0 BV 1 (SUTURE) ×6 IMPLANT
SUT VIC AB 3-0 SH 27 (SUTURE) ×4
SUT VIC AB 3-0 SH 27X BRD (SUTURE) ×2 IMPLANT
UNDERPAD 30X30 (UNDERPADS AND DIAPERS) ×6 IMPLANT
WATER STERILE IRR 1000ML POUR (IV SOLUTION) IMPLANT

## 2016-09-10 NOTE — Anesthesia Preprocedure Evaluation (Addendum)
Anesthesia Evaluation  Patient identified by MRN, date of birth, ID band Patient awake    Reviewed: Allergy & Precautions, NPO status , Patient's Chart, lab work & pertinent test results, reviewed documented beta blocker date and time   History of Anesthesia Complications Negative for: history of anesthetic complications  Airway Mallampati: II  TM Distance: >3 FB Neck ROM: Full    Dental no notable dental hx. (+) Dental Advisory Given   Pulmonary former smoker,    Pulmonary exam normal        Cardiovascular hypertension, + CAD  Normal cardiovascular exam  Impressions:  - Normal LVEF with normal wall motion but severe LVH, and mild   diastolic dysfunction.    Neuro/Psych PSYCHIATRIC DISORDERS Anxiety Depression CVA, No Residual Symptoms    GI/Hepatic negative GI ROS, Neg liver ROS,   Endo/Other    Renal/GU      Musculoskeletal   Abdominal   Peds  Hematology   Anesthesia Other Findings   Reproductive/Obstetrics                           Anesthesia Physical Anesthesia Plan  ASA: III  Anesthesia Plan: MAC   Post-op Pain Management:    Induction:   Airway Management Planned: Simple Face Mask and Natural Airway  Additional Equipment:   Intra-op Plan:   Post-operative Plan:   Informed Consent: I have reviewed the patients History and Physical, chart, labs and discussed the procedure including the risks, benefits and alternatives for the proposed anesthesia with the patient or authorized representative who has indicated his/her understanding and acceptance.   Dental advisory given  Plan Discussed with: CRNA and Anesthesiologist  Anesthesia Plan Comments:         Anesthesia Quick Evaluation

## 2016-09-10 NOTE — H&P (View-Only) (Signed)
Vascular and Vein Specialist of [facility]  Patient name: Alan Gross MRN: [id number] DOB: [birth year] Sex: male  REASON FOR CONSULT: Hemodialysis access  HPI: Alan Gross is a 30 y.o. male, who is day for discussion of hemodialysis access. He is currently being dialyzed via a right IJ catheter. His had no difficulty with this. He does work Architect work with [employer] and is very difficult due to this with a hemodialysis schedule is with his catheter.  Past Medical History:  Diagnosis Date  . Anxiety   . Depression   . H/O cardiac catheterization   . Hypertension   . Irregular heart beats   . Kidney failure    15 %   . Seizures ([place])    when he was a child  . Stroke (cerebrum) [facility])     Family History  Problem Relation Age of Onset  . Hypertension Other   . Cancer Father     liver  . Hypertension Brother   . Diabetes Paternal Aunt   . Diabetes Paternal Uncle     SOCIAL HISTORY: Social History   Social History  . Marital status: Married    Spouse name: N/A  . Number of children: N/A  . Years of education: N/A   Occupational History  . Not on file.   Social History Main Topics  . Smoking status: Former Smoker    Packs/day: 1.00    Years: 10.00    Types: Cigarettes    Quit date: 06/22/2016  . Smokeless tobacco: Former Systems developer    Quit date: 05/02/2015  . Alcohol use Yes     Comment: occas.   . Drug use:     Types: Marijuana  . Sexual activity: Not on file   Other Topics Concern  . Not on file   Social History Narrative  . No narrative on file    No Known Allergies  Current Outpatient Prescriptions  Medication Sig Dispense Refill  . amLODipine (NORVASC) 10 MG tablet Take 10 mg by mouth daily.    . carvedilol (COREG) 25 MG tablet Take 25 mg by mouth 2 (two) times daily with a meal.    . cloNIDine (CATAPRES) 0.2 MG tablet Take 1 tablet (0.2 mg total) by mouth 2 (two) times daily. 60 tablet 0  . hydrALAZINE  (APRESOLINE) 50 MG tablet Take 1 tablet (50 mg total) by mouth 3 (three) times daily. 90 tablet 0  . losartan (COZAAR) 100 MG tablet Take 1 tablet (100 mg total) by mouth daily. 30 tablet 0  . spironolactone (ALDACTONE) 25 MG tablet Take 1 tablet (25 mg total) by mouth daily. 30 tablet 0  . aspirin 81 MG tablet Take 1 tablet (81 mg total) by mouth daily. (Patient not taking: Reported on 09/03/2016) 30 tablet   . cyclobenzaprine (FLEXERIL) 10 MG tablet Take 0.5-1 tablets (5-10 mg total) by mouth 3 (three) times daily as needed for muscle spasms. (Patient not taking: Reported on 09/03/2016) 30 tablet 1   No current facility-administered medications for this visit.     REVIEW OF SYSTEMS:  [X]  denotes positive finding, [ ]  denotes negative finding Cardiac  Comments:  Chest pain or chest pressure:    Shortness of breath upon exertion:    Short of breath when lying flat:    Irregular heart rhythm:        Vascular    Pain in calf, thigh, or hip brought on by ambulation:    Pain in feet at night that wakes you  up from your sleep:     Blood clot in your veins:    Leg swelling:         Pulmonary    Oxygen at home:    Productive cough:     Wheezing:         Neurologic    Sudden weakness in arms or legs:     Sudden numbness in arms or legs:     Sudden onset of difficulty speaking or slurred speech:    Temporary loss of vision in one eye:     Problems with dizziness:         Gastrointestinal    Blood in stool:     Vomited blood:         Genitourinary    Burning when urinating:     Blood in urine:        Psychiatric    Major depression:         Hematologic    Bleeding problems:    Problems with blood clotting too easily:        Skin    Rashes or ulcers:        Constitutional    Fever or chills:      PHYSICAL EXAM: Vitals:   09/03/16 0922 09/03/16 0925  BP: (!) 158/110 (!) 156/113  Pulse: (!) 59   Resp: 18   Temp: 98.2 F (36.8 C)   TempSrc: Oral   SpO2: 100%     Weight: 258 lb 6.4 oz (117.2 kg)   Height: 6\' 1"  (1.854 m)     GENERAL: The patient is a well-nourished male, in no acute distress. The vital signs are documented above. CARDIOVASCULAR: Radial pulses bilaterally. Does have visible cephalic vein at the wrist bilaterally PULMONARY: There is good air exchange  ABDOMEN: Soft and non-tender  MUSCULOSKELETAL: There are no major deformities or cyanosis. NEUROLOGIC: No focal weakness or paresthesias are detected. SKIN: There are no ulcers or rashes noted. PSYCHIATRIC: The patient has a normal affect.  DATA:  Duplex reveals adequate the cephalic vein in the left wrist for attempted fistula creation  MEDICAL ISSUES: Discussed options. Have recommended left AV fistula creation since he is right hand dominant. He currently undergoes hemodialysis on Monday Wednesday Friday. Scheduled this electively at his convenience on a Tuesday or Thursday   Rosetta Posner, MD [facility] V[facility] and Vein Specialists of [facility] Tel [phone] Pager [phone]

## 2016-09-10 NOTE — Discharge Instructions (Signed)
° ° °  09/10/2016 Alan Gross [id number] January 03, 1986  Surgeon(s): Conrad Jersey, MD  Procedure(s): Left radial cephalic AV fistula  x Do not stick fistula for 12 weeks

## 2016-09-10 NOTE — Op Note (Signed)
OPERATIVE NOTE   PROCEDURE: 1.  Left radiocephaic arteriovenous fistula placement  PRE-OPERATIVE DIAGNOSIS: end stage renal disease   POST-OPERATIVE DIAGNOSIS: same as above   SURGEON: Adele Barthel, MD  ASSISTANT(S): Leontine Locket, PAC   ANESTHESIA: general  ESTIMATED BLOOD LOSS: 50 cc  FINDING(S): 1.  Palpable thrill and radial pulse at end of case  SPECIMEN(S):  none  INDICATIONS:   Alan Gross is a 30 y.o. ([birth year]) male who presents with end stage renal disease.  The patient is scheduled for left arm arteriovenous fistula placement.  The patient is aware the risks include but are not limited to: bleeding, infection, steal syndrome, nerve damage, ischemic monomelic neuropathy, failure to mature, and need for additional procedures.  The patient is aware of the risks of the procedure and elects to proceed forward.  DESCRIPTION: After full informed written consent was obtained from the patient, the patient was brought back to the operating room and placed supine upon the operating table.  Prior to induction, the patient received IV antibiotics.   After obtaining adequate anesthesia, the patient was then prepped and draped in the standard fashion for a left arm access procedure.  I turned my attention first to identifying the patient's distal cephalic vein and radial artery.  Using SonoSite guidance, the location of these vessels were marked out on the skin.   I made a longitudinal incision at the level of the wrist and dissected through the subcutaneous tissue and fascia to gain exposure of the radial artery.  This was noted to be 2.0-2.5 mm in diameter externally.  This was dissected out proximally and distally and controlled with vessel loops .  I then made another longitudinal incision over the cephalic vein >3 cm distal to the radial artery.  I dissected out the cephalic vein.  This was noted to be 3-3.5 mm in diameter externally.  The distal segment of the vein was ligated  with a  2-0 silk, and the vein was transected.  The proximal segment was interrogated with serial dilators.  The vein accepted up to a 4.0 mm dilator without any difficulty.  I then instilled the heparinized saline into the vein and clamped it.  I dissected a subcutaneous tunnel from the radial exposure to the cephalic vein exposure.  I delivered the vein through the subcutaneous tunnel, taking care to maintain orientation.  At this point, I reset my exposure of the radial artery and placed the artery under tension proximally and distally.  I made an arteriotomy with a #11 blade, and then I extended the arteriotomy with a Potts scissor.  I injected heparinized saline proximal and distal to this arteriotomy.  The vein was then sewn to the artery in an end-to-side configuration with a running stitch of 7-0 Prolene.  Prior to completing this anastomosis, I allowed the vein and artery to backbleed.  There was no evidence of clot from any vessels.  I completed the anastomosis in the usual fashion and then released all vessel loops and clamps.  There was a faintly palpable thrill in the venous outflow, and there was a dopplerable radial signal.  At this point, I irrigated out the surgical incisions.  There was no further active bleeding.    The subcutaneous tissue in all incisions was reapproximated with a running stitch of 3-0 Vicryl.  The skin in all incisions was then reapproximated with a running subcuticular stitch of 4-0 Vicryl.  The skin in all incisions was then cleaned, dried, and reinforced  with Dermabond.  The patient tolerated this procedure well.    COMPLICATIONS: none  CONDITION: stable   Adele Barthel, MD, [facility] Vascular and Vein Specialists of [place] Office: [phone] Pager: [phone]  09/10/2016, 10:42 AM

## 2016-09-10 NOTE — Transfer of Care (Signed)
Immediate Anesthesia Transfer of Care Note  Patient: Alan Gross  Procedure(s) Performed: Procedure(s): LEFT ARTERIOVENOUS (AV) FISTULA CREATION (Left)  Patient Location: PACU  Anesthesia Type:MAC  Level of Consciousness: awake, alert , oriented and patient cooperative  Airway & Oxygen Therapy: Patient Spontanous Breathing and Patient connected to nasal cannula oxygen  Post-op Assessment: Report given to RN, Post -op Vital signs reviewed and stable and Patient moving all extremities  Post vital signs: Reviewed and stable  Last Vitals:  Vitals:   09/10/16 0748  BP: (!) 145/88  Resp: 20  Temp: 36.8 C    Last Pain: There were no vitals filed for this visit.    Patients Stated Pain Goal: 3 (32/25/67 2091)  Complications: No apparent anesthesia complications

## 2016-09-10 NOTE — Interval H&P Note (Signed)
History and Physical Interval Note:  09/10/2016 8:57 AM  Alan Gross  has presented today for surgery, with the diagnosis of End Stage Renal Disease N18.6  The various methods of treatment have been discussed with the patient and family. After consideration of risks, benefits and other options for treatment, the patient has consented to  Procedure(s): ARTERIOVENOUS (AV) FISTULA CREATION (Left) as a surgical intervention .  The patient's history has been reviewed, patient examined, no change in status, stable for surgery.  I have reviewed the patient's chart and labs.  Questions were answered to the patient's satisfaction.     Adele Barthel

## 2016-09-10 NOTE — Anesthesia Postprocedure Evaluation (Signed)
Anesthesia Post Note  Patient: Alan Gross  Procedure(s) Performed: Procedure(s) (LRB): LEFT ARTERIOVENOUS (AV) FISTULA CREATION (Left)  Patient location during evaluation: PACU Anesthesia Type: MAC Level of consciousness: awake and alert Pain management: pain level controlled Vital Signs Assessment: post-procedure vital signs reviewed and stable Respiratory status: spontaneous breathing and respiratory function stable Cardiovascular status: stable Anesthetic complications: no    Last Vitals:  Vitals:   09/10/16 1141 09/10/16 1149  BP: (!) 152/80 (!) 154/99  Pulse: 63 (!) 58  Resp: 19 20  Temp:      Last Pain:  Vitals:   09/10/16 1149  PainSc: (P) 3                  Verbena Boeding DANIEL

## 2016-09-10 NOTE — Progress Notes (Signed)
Report to Ericka RN as primary caregiver 

## 2016-09-11 ENCOUNTER — Telehealth: Payer: Self-pay | Admitting: Vascular Surgery

## 2016-09-11 ENCOUNTER — Encounter (HOSPITAL_COMMUNITY): Payer: Self-pay | Admitting: Vascular Surgery

## 2016-09-11 NOTE — Telephone Encounter (Signed)
Spoke to pt will mail letter with driving instructions to the office, 12/15

## 2016-09-11 NOTE — Telephone Encounter (Signed)
-----   Message from Denman George, RN sent at 09/10/2016 12:19 PM EDT ----- Regarding: needs 6 week f/u with Dr. Bridgett Larsson   ----- Message ----- From: Conrad [place], MD Sent: 09/10/2016  10:43 AM To: Vvs Charge Pool  Alan Gross [id number] 08/11/1986  PROCEDURE: 1.  Left radiocephaic arteriovenous fistula placement  Asst: Leontine Locket, PAC   Follow-up: 6 weeks

## 2016-10-23 ENCOUNTER — Encounter: Payer: Self-pay | Admitting: Vascular Surgery

## 2016-10-28 NOTE — Progress Notes (Signed)
    Postoperative Access Visit   History of Present Illness  Alan Gross is a 30 y.o. year old male who presents for postoperative follow-up for: L RC AVF (Date: 09/10/16).  The patient's wounds are healed.  The patient notes no steal symptoms.  The patient is able to complete their activities of daily living.  The patient's current symptoms are: none.  For VQI Use Only  PRE-ADM LIVING: Home  AMB STATUS: Ambulatory  Physical Examination Vitals:   11/01/16 1041 11/01/16 1044  BP: (!) 207/136 (!) 180/118  Pulse: 89   Resp: 18   Temp: 97.6 F (36.4 C)     LUE: Incisions are healed, skin feels warm, hand grip is 5/5, sensation in digits is intact, palpable thrill, bruit can be auscultated, on Sonosite: fistula > 6 mm  Medical Decision Making  Alan Gross is a 30 y.o. year old male who presents s/p L RC AVF.   The patient's access is ready for use.  The patient's tunneled dialysis catheter can be removed after two successful cannulations and completed dialysis treatments.  Thank you for allowing [place] to participate in this patient's care.   Adele Barthel, MD, FACS [facility] of [place] Office: [phone] Pager: [phone]

## 2016-11-01 ENCOUNTER — Encounter: Payer: Self-pay | Admitting: Vascular Surgery

## 2016-11-01 ENCOUNTER — Ambulatory Visit (INDEPENDENT_AMBULATORY_CARE_PROVIDER_SITE_OTHER): Payer: Self-pay | Admitting: Vascular Surgery

## 2016-11-01 VITALS — BP 180/118 | HR 89 | Temp 97.6°F | Resp 18 | Ht 73.0 in | Wt 273.3 lb

## 2016-11-01 DIAGNOSIS — N186 End stage renal disease: Secondary | ICD-10-CM

## 2016-11-01 DIAGNOSIS — Z992 Dependence on renal dialysis: Secondary | ICD-10-CM

## 2017-04-01 ENCOUNTER — Encounter: Payer: Self-pay | Admitting: Emergency Medicine

## 2017-04-01 ENCOUNTER — Inpatient Hospital Stay
Admission: EM | Admit: 2017-04-01 | Discharge: 2017-04-03 | DRG: 640 | Disposition: A | Discharge status: Home / Self Care | Payer: 59 | Attending: Internal Medicine | Admitting: Internal Medicine

## 2017-04-01 ENCOUNTER — Emergency Department: Payer: 59

## 2017-04-01 DIAGNOSIS — R0602 Shortness of breath: Secondary | ICD-10-CM | POA: Diagnosis not present

## 2017-04-01 DIAGNOSIS — F17210 Nicotine dependence, cigarettes, uncomplicated: Secondary | ICD-10-CM | POA: Diagnosis present

## 2017-04-01 DIAGNOSIS — I248 Other forms of acute ischemic heart disease: Secondary | ICD-10-CM | POA: Diagnosis present

## 2017-04-01 DIAGNOSIS — N2581 Secondary hyperparathyroidism of renal origin: Secondary | ICD-10-CM | POA: Diagnosis present

## 2017-04-01 DIAGNOSIS — E876 Hypokalemia: Secondary | ICD-10-CM | POA: Diagnosis present

## 2017-04-01 DIAGNOSIS — I160 Hypertensive urgency: Secondary | ICD-10-CM | POA: Diagnosis present

## 2017-04-01 DIAGNOSIS — Z992 Dependence on renal dialysis: Secondary | ICD-10-CM | POA: Diagnosis not present

## 2017-04-01 DIAGNOSIS — Z7982 Long term (current) use of aspirin: Secondary | ICD-10-CM

## 2017-04-01 DIAGNOSIS — Z8673 Personal history of transient ischemic attack (TIA), and cerebral infarction without residual deficits: Secondary | ICD-10-CM | POA: Diagnosis not present

## 2017-04-01 DIAGNOSIS — E8770 Fluid overload, unspecified: Secondary | ICD-10-CM | POA: Diagnosis not present

## 2017-04-01 DIAGNOSIS — F418 Other specified anxiety disorders: Secondary | ICD-10-CM | POA: Diagnosis present

## 2017-04-01 DIAGNOSIS — J810 Acute pulmonary edema: Secondary | ICD-10-CM | POA: Diagnosis not present

## 2017-04-01 DIAGNOSIS — I10 Essential (primary) hypertension: Secondary | ICD-10-CM

## 2017-04-01 DIAGNOSIS — I132 Hypertensive heart and chronic kidney disease with heart failure and with stage 5 chronic kidney disease, or end stage renal disease: Secondary | ICD-10-CM | POA: Diagnosis present

## 2017-04-01 DIAGNOSIS — I5030 Unspecified diastolic (congestive) heart failure: Secondary | ICD-10-CM | POA: Diagnosis present

## 2017-04-01 DIAGNOSIS — N186 End stage renal disease: Secondary | ICD-10-CM | POA: Diagnosis present

## 2017-04-01 DIAGNOSIS — Z888 Allergy status to other drugs, medicaments and biological substances status: Secondary | ICD-10-CM | POA: Diagnosis not present

## 2017-04-01 LAB — GLUCOSE, CAPILLARY: Glucose-Capillary: 121 mg/dL — ABNORMAL HIGH (ref 65–99)

## 2017-04-01 LAB — BASIC METABOLIC PANEL
ANION GAP: 11 (ref 5–15)
BUN: 43 mg/dL — ABNORMAL HIGH (ref 6–20)
CHLORIDE: 104 mmol/L (ref 101–111)
CO2: 25 mmol/L (ref 22–32)
CREATININE: 5.18 mg/dL — AB (ref 0.61–1.24)
Calcium: 8.6 mg/dL — ABNORMAL LOW (ref 8.9–10.3)
GFR calc non Af Amer: 13 mL/min — ABNORMAL LOW (ref >60)
GFR, EST AFRICAN AMERICAN: 15 mL/min — AB (ref >60)
Glucose, Bld: 103 mg/dL — ABNORMAL HIGH (ref 65–99)
POTASSIUM: 3 mmol/L — AB (ref 3.5–5.1)
SODIUM: 140 mmol/L (ref 135–145)

## 2017-04-01 LAB — CBC
HCT: 35.4 % — ABNORMAL LOW (ref 40.0–52.0)
HEMOGLOBIN: 12 g/dL — AB (ref 13.0–18.0)
MCH: 29.6 pg (ref 26.0–34.0)
MCHC: 33.9 g/dL (ref 32.0–36.0)
MCV: 87.5 fL (ref 80.0–100.0)
PLATELETS: 171 10*3/uL (ref 150–440)
RBC: 4.05 MIL/uL — AB (ref 4.40–5.90)
RDW: 16.3 % — ABNORMAL HIGH (ref 11.5–14.5)
WBC: 10.1 10*3/uL (ref 3.8–10.6)

## 2017-04-01 LAB — TROPONIN I: TROPONIN I: 0.11 ng/mL — AB (ref <0.03)

## 2017-04-01 LAB — MRSA PCR SCREENING: MRSA by PCR: NEGATIVE

## 2017-04-01 MED ORDER — AMLODIPINE BESYLATE 10 MG PO TABS
10.0000 mg | ORAL_TABLET | Freq: Every evening | ORAL | Status: DC
  Administered 2017-04-01 – 2017-04-02 (×2): 10 mg via ORAL
  Filled 2017-04-01 (×2): qty 1

## 2017-04-01 MED ORDER — CARVEDILOL 25 MG PO TABS
25.0000 mg | ORAL_TABLET | Freq: Two times a day (BID) | ORAL | Status: DC
  Administered 2017-04-02 – 2017-04-03 (×2): 25 mg via ORAL
  Filled 2017-04-01 (×3): qty 1

## 2017-04-01 MED ORDER — OXYCODONE-ACETAMINOPHEN 5-325 MG PO TABS
1.0000 | ORAL_TABLET | Freq: Four times a day (QID) | ORAL | Status: DC | PRN
  Administered 2017-04-01: 1 via ORAL
  Filled 2017-04-01 (×2): qty 1

## 2017-04-01 MED ORDER — LOSARTAN POTASSIUM 50 MG PO TABS: 100.0000 mg | ORAL_TABLET | Freq: Every day | ORAL | Status: DC

## 2017-04-01 MED ORDER — HYDRALAZINE HCL 20 MG/ML IJ SOLN
10.0000 mg | Freq: Once | INTRAMUSCULAR | Status: AC
  Administered 2017-04-01: 10 mg via INTRAVENOUS

## 2017-04-01 MED ORDER — NICARDIPINE HCL IN NACL 20-0.86 MG/200ML-% IV SOLN
INTRAVENOUS | Status: AC
  Administered 2017-04-01: 5 mg/h via INTRAVENOUS
  Filled 2017-04-01: qty 200

## 2017-04-01 MED ORDER — ACETAMINOPHEN 325 MG PO TABS
650.0000 mg | ORAL_TABLET | Freq: Four times a day (QID) | ORAL | Status: DC | PRN
  Administered 2017-04-02: 650 mg via ORAL
  Filled 2017-04-01 (×2): qty 2

## 2017-04-01 MED ORDER — NITROGLYCERIN 2 % TD OINT: 0.5000 [in_us] | TOPICAL_OINTMENT | Freq: Four times a day (QID) | TRANSDERMAL | Status: DC

## 2017-04-01 MED ORDER — LABETALOL HCL 5 MG/ML IV SOLN
10.0000 mg | INTRAVENOUS | Status: AC
  Administered 2017-04-01: 10 mg via INTRAVENOUS
  Filled 2017-04-01: qty 4

## 2017-04-01 MED ORDER — MORPHINE SULFATE (PF) 2 MG/ML IV SOLN
2.0000 mg | Freq: Once | INTRAVENOUS | Status: AC
  Administered 2017-04-01: 2 mg via INTRAVENOUS

## 2017-04-01 MED ORDER — MORPHINE SULFATE (PF) 2 MG/ML IV SOLN
INTRAVENOUS | Status: AC
  Filled 2017-04-01: qty 1

## 2017-04-01 MED ORDER — DOCUSATE SODIUM 100 MG PO CAPS
100.0000 mg | ORAL_CAPSULE | Freq: Two times a day (BID) | ORAL | Status: DC
  Administered 2017-04-03: 100 mg via ORAL
  Filled 2017-04-01 (×4): qty 1

## 2017-04-01 MED ORDER — NITROGLYCERIN 2 % TD OINT
0.5000 [in_us] | TOPICAL_OINTMENT | Freq: Once | TRANSDERMAL | Status: AC
  Administered 2017-04-01: 0.5 [in_us] via TOPICAL
  Filled 2017-04-01: qty 1

## 2017-04-01 MED ORDER — HYDRALAZINE HCL 20 MG/ML IJ SOLN
INTRAMUSCULAR | Status: AC
  Filled 2017-04-01: qty 1

## 2017-04-01 MED ORDER — HYDRALAZINE HCL 20 MG/ML IJ SOLN: 20.0000 mg | INTRAMUSCULAR | Status: AC

## 2017-04-01 MED ORDER — ONDANSETRON HCL 4 MG PO TABS: 4.0000 mg | ORAL_TABLET | Freq: Four times a day (QID) | ORAL | Status: DC | PRN

## 2017-04-01 MED ORDER — ONDANSETRON HCL 4 MG/2ML IJ SOLN
4.0000 mg | Freq: Four times a day (QID) | INTRAMUSCULAR | Status: DC | PRN
  Administered 2017-04-01: 4 mg via INTRAVENOUS
  Filled 2017-04-01: qty 2

## 2017-04-01 MED ORDER — HEPARIN SODIUM (PORCINE) 5000 UNIT/ML IJ SOLN
5000.0000 [IU] | Freq: Three times a day (TID) | INTRAMUSCULAR | Status: DC
  Administered 2017-04-01 – 2017-04-03 (×5): 5000 [IU] via SUBCUTANEOUS
  Filled 2017-04-01 (×5): qty 1

## 2017-04-01 MED ORDER — BUMETANIDE 0.25 MG/ML IJ SOLN
0.5000 mg | Freq: Once | INTRAMUSCULAR | Status: AC
  Administered 2017-04-01: 0.5 mg via INTRAVENOUS
  Filled 2017-04-01: qty 2

## 2017-04-01 MED ORDER — ACETAMINOPHEN 650 MG RE SUPP: 650.0000 mg | Freq: Four times a day (QID) | RECTAL | Status: DC | PRN

## 2017-04-01 MED ORDER — HYDRALAZINE HCL 20 MG/ML IJ SOLN
INTRAMUSCULAR | Status: AC
Start: 2017-04-01 — End: 2017-04-01
  Administered 2017-04-01: 10 mg via INTRAVENOUS
  Filled 2017-04-01: qty 1

## 2017-04-01 MED ORDER — NICARDIPINE HCL IN NACL 20-0.86 MG/200ML-% IV SOLN
3.0000 mg/h | INTRAVENOUS | Status: DC
  Administered 2017-04-01: 5 mg/h via INTRAVENOUS
  Administered 2017-04-02: 1 mg/h via INTRAVENOUS
  Administered 2017-04-02: 7.5 mg/h via INTRAVENOUS
  Filled 2017-04-01 (×3): qty 200

## 2017-04-01 MED ORDER — CLONIDINE HCL 0.1 MG PO TABS
0.1000 mg | ORAL_TABLET | Freq: Two times a day (BID) | ORAL | Status: DC
  Administered 2017-04-01: 0.1 mg via ORAL
  Filled 2017-04-01: qty 1

## 2017-04-01 MED ORDER — ASPIRIN 81 MG PO CHEW
81.0000 mg | CHEWABLE_TABLET | Freq: Every day | ORAL | Status: DC
  Administered 2017-04-02 – 2017-04-03 (×2): 81 mg via ORAL
  Filled 2017-04-01 (×2): qty 1

## 2017-04-01 NOTE — ED Notes (Signed)
Pt reports intermittent periods of "feeling like I can't breath." EDP aware and Is with pt. Pt states it comes and goes, after talking with EDP pt states he is feeling better.

## 2017-04-01 NOTE — Progress Notes (Addendum)
Name: Alan Gross MRN: [id number] DOB: [birth year]    ADMISSION DATE:  04/01/2017  CHIEF COMPLAINT:  Shortness of Breath  BRIEF PATIENT DESCRIPTION: 31 year old male with ESRD now presenting with hypertensive urgency.  SIGNIFICANT EVENTS  5/15 >> Patient admitted with hypertensive urgency on nicardipine drip.  STUDIES:  04/17/16 ECHO>>The cavity size was normal. There was severe  concentric hypertrophy. Systolic function was normal. The estimated ejection fraction was 65%.   HISTORY OF PRESENT ILLNESS:  Alan Gross is a 31 year old male with known history of ESRD, Hypertension and stroke. Patient presents to ED on 5/15 with shortness of breath.  He admits that he had missed at least 2 dialysis treatment as he is trying to establish care with the dialysis center closer to his home.  He became progressively sob over the period of past 4 days.  CXR was concerning for Pulmonary edema and received a dose of Bumex in the ED.  Patient was also noted to have  A very high BP and was started on Nicardipine.  Patient states that his normal systolic  blood pressure is 212.  Patient was admitted by the hospitalist and sent to the ICU for close observation.  PAST MEDICAL HISTORY :   has a past medical history of Anxiety; Depression; Dyspnea; H/O cardiac catheterization; History of febrile seizure; Hypertension; Irregular heart beats; Kidney failure; and Stroke (cerebrum) ([place]).  has a past surgical history that includes Coronary angioplasty; Cardiac catheterization (Left, 05/02/2015); Cardiac catheterization (N/A, 07/11/2016); and AV fistula placement (Left, 09/10/2016). Prior to Admission medications   Medication Sig Start Date End Date Taking? Authorizing Provider  amLODipine (NORVASC) 10 MG tablet Take 10 mg by mouth every evening.    Yes [provider]  carvedilol (COREG) 25 MG tablet Take 25 mg by mouth 2 (two) times daily with a meal.   Yes [provider]  cloNIDine  (CATAPRES) 0.1 MG tablet Take 0.1 mg by mouth 2 (two) times daily.   Yes [provider]  losartan (COZAAR) 100 MG tablet Take 1 tablet (100 mg total) by mouth daily. Patient taking differently: Take 100 mg by mouth every evening.  07/14/16  Yes Dustin Flock, MD  aspirin 81 MG tablet Take 1 tablet (81 mg total) by mouth daily. Patient not taking: Reported on 04/01/2017 07/19/16   [name], DO  oxyCODONE-acetaminophen (ROXICET) 5-325 MG tablet Take 1 tablet by mouth every 6 (six) hours as needed for severe pain. Patient not taking: Reported on 11/01/2016 09/10/16   Gabriel Earing, PA-C   Allergies  Allergen Reactions  . Lasix [Furosemide] Shortness Of Breath, Swelling and Anxiety    FAMILY HISTORY:  family history includes Cancer in his father; Diabetes in his paternal aunt and paternal uncle; Hypertension in his brother and other. SOCIAL HISTORY:  reports that he has been smoking Cigarettes.  He has a 2.50 pack-year smoking history. He has never used smokeless tobacco. He reports that he uses drugs, including Marijuana. He reports that he does not drink alcohol.  REVIEW OF SYSTEMS:   Constitutional: Negative for fever, chills, weight loss, malaise/fatigue and diaphoresis.  HENT: Negative for hearing loss, ear pain, nosebleeds, congestion, sore throat, neck pain, tinnitus and ear discharge.   Eyes: Negative for blurred vision, double vision, photophobia, pain, discharge and redness.  Respiratory: Negative for cough, hemoptysis, sputum production, shortness of breath, wheezing and stridor.   Cardiovascular: Negative for chest pain, palpitations, orthopnea, claudication, leg swelling and PND.  Gastrointestinal: Negative  for heartburn, nausea, vomiting, abdominal pain, diarrhea, constipation, blood in stool and melena.  Genitourinary: Negative for dysuria, urgency, frequency, hematuria and flank pain.  Musculoskeletal: Negative for myalgias, back pain, joint pain and  falls.  Skin: Negative for itching and rash.  Neurological: Negative for dizziness, tingling, tremors, sensory change, speech change, focal weakness, seizures, loss of consciousness, weakness and headaches.  Endo/Heme/Allergies: Negative for environmental allergies and polydipsia. Does not bruise/bleed easily.  SUBJECTIVE: Patient was complaining of headache.  VITAL SIGNS: Temp:  [99.1 F (37.3 C)-99.5 F (37.5 C)] 99.5 F (37.5 C) (05/15 2235) Pulse Rate:  [91-108] 108 (05/15 2300) Resp:  [17-31] 31 (05/15 2300) BP: (190-270)/(108-188) 202/108 (05/15 2300) SpO2:  [92 %-99 %] 92 % (05/15 2300) Weight:  [270 lb (122.5 kg)-277 lb 12.5 oz (126 kg)] 277 lb 12.5 oz (126 kg) (05/15 2235)  PHYSICAL EXAMINATION: General:  Young male, in no acute distress Neuro:  Awake, Alert and oriented  HEENT:  AT,[place], No jvd Cardiovascular:  Tachycardia,S1S2,No m/r/g Lungs:  Rhonchi noted, no wheezes, crackles noted Abdomen:  Soft, NT,ND Musculoskeletal:  No edema, cyanosis  Skin:  Warm.dry and Intact   Recent Labs Lab 04/01/17 1625  NA 140  K 3.0*  CL 104  CO2 25  BUN 43*  CREATININE 5.18*  GLUCOSE 103*    Recent Labs Lab 04/01/17 1625  HGB 12.0*  HCT 35.4*  WBC 10.1  PLT 171   Dg Chest 2 View  Result Date: 04/01/2017 CLINICAL DATA:  Chest pain and shortness of breath.  Renal failure. EXAM: CHEST  2 VIEW COMPARISON:  07/12/2016 FINDINGS: The right IJ dialysis catheter has been removed. The heart is enlarged. There is vascular congestion, interstitial thickening/ Kerley B-lines, pulmonary edema and small bilateral pleural effusions consistent with CHF. IMPRESSION: CHF Electronically Signed   By: Marijo Sanes M.D.   On: 04/01/2017 16:34    ASSESSMENT / PLAN:  Hypertensive urgency Pulmonary edema ESRD(M-W-F) Hypokalemia  Continue Nicardipine gtt to a goal of SBP <200  Bumex X1 in ED Nephrology consulted Continue coreg/clonidine/losartan/Amlodipine Plan for dialysis  5/16 Replace electrolytes per usual guidelines. Restrict fluids to -1200 ml.  Bincy Varughese,AG-ACNP Pulmonary and [place]   04/01/2017, 11:57 PM   Pt seen and examined with NP, agree with assessment, plan as amended. Acute pulmonary edema with hypertensive urgency after the patient missed 2 or more sessions of HD. Currently the patient is awake and arousable, he has no new complaints. His lungs are clear to auscultation, SBP in 160's on a low dose of nicardipine. I personally reviewed CXR image 04/01/17; which is consistent with pulmonary edema not seen on previous CXR.  Will start scheduled PO meds and wean off nicardipine drip as tolerated. HD per nephrology. Pt states that he has not been taking his home antihypertensives with any regularity, therefore will continue coreg and amlodipine, no clonidine and will cut losartan dose in half to 50 mg daily.   -Marda Stalker, M.D. 04/02/2017

## 2017-04-01 NOTE — ED Notes (Signed)
Pharmacy aware of bumex order, states they will tube it to ED.

## 2017-04-01 NOTE — ED Notes (Signed)
Pt placed on 2L of oxygen, pt states "I am afraid if I go to sleep I will wake up not able to breath." For pt comfort pt placed on 2L with O2 sat 97%.

## 2017-04-01 NOTE — ED Provider Notes (Signed)
[facility] Emergency Department Provider Note   ____________________________________________   I have reviewed the triage vital signs and the nursing notes.   HISTORY  Chief Complaint needs dialysis   History limited by: Not Limited   HPI Alan Gross is a 31 y.o. male who presents to the emergency department today because he states he needs dialysis. He states he has not had dialysis for roughly 1 week. This is because he is switching dialysis locations. He called his new dialysis center who stated it had been too long since he last had dialysis and he would have to go to his old center (which he could not get to) or the hospital. The patient has had associated shortness of breath. He still produces urine.   Past Medical History:  Diagnosis Date  . Anxiety   . Depression   . Dyspnea   . H/O cardiac catheterization   . History of febrile seizure   . Hypertension   . Irregular heart beats   . Kidney failure    M,W,F [facility] in [place]; 15 %   . 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 ESRD (end stage renal disease) on dialysis ([place]) 07/19/2016  . Anxiety 07/19/2016  . Heart failure, diastolic, due to HTN ([place]) 07/19/2016  . Costochondritis 07/19/2016  . HTN (hypertension) 07/09/2016    Past Surgical History:  Procedure Laterality Date  . AV FISTULA PLACEMENT Left 09/10/2016   Procedure: LEFT ARTERIOVENOUS (AV) FISTULA CREATION;  Surgeon: Conrad Bronx, MD;  Location: [facility];  Service: Vascular;  Laterality: Left;  . CARDIAC CATHETERIZATION Left 05/02/2015   Procedure: Left Heart Cath and Coronary Angiography;  Surgeon: Dionisio David, MD;  Location: [facility];  Service: Cardiovascular;  Laterality: Left;  . CORONARY ANGIOPLASTY    . PERIPHERAL VASCULAR CATHETERIZATION N/A 07/11/2016   Procedure: Dialysis/Perma Catheter Insertion;  Surgeon: Algernon Huxley, MD;  Location: [facility];  Service: Cardiovascular;  Laterality: N/A;    Prior to Admission medications   Medication Sig Start Date End Date Taking? Authorizing Provider  amLODipine (NORVASC) 10 MG tablet Take 10 mg by mouth every evening.     [provider]  aspirin 81 MG tablet Take 1 tablet (81 mg total) by mouth daily. 07/19/16   Karamalegos, Devonne Doughty, DO  B Complex-C-Folic Acid (RENA-VITE PO) Take 1 tablet by mouth daily.    [provider]  carvedilol (COREG) 25 MG tablet Take 25 mg by mouth 2 (two) times daily with a meal.    [provider]  cloNIDine (CATAPRES) 0.1 MG tablet Take 0.1 mg by mouth 2 (two) times daily.    [provider]  losartan (COZAAR) 100 MG tablet Take 1 tablet (100 mg total) by mouth daily. Patient taking differently: Take 100 mg by mouth every evening.  07/14/16   Dustin Flock, MD  oxyCODONE-acetaminophen (ROXICET) 5-325 MG tablet Take 1 tablet by mouth every 6 (six) hours as needed for severe pain. Patient not taking: Reported on 11/01/2016 09/10/16   Gabriel Earing, PA-C    Allergies Patient has no known allergies.  Family History  Problem Relation Age of Onset  . Hypertension Other   . Cancer Father        liver  . Hypertension Brother   . Diabetes Paternal Aunt   . Diabetes Paternal Uncle     Social History  Social History  Substance Use Topics  . Smoking status: Current Some Day Smoker    Packs/day: 0.25    Years: 10.00    Types: Cigarettes    Last attempt to quit: 06/22/2016  . Smokeless tobacco: Never Used     Comment: smokes 1-2 cigarettes daily  . Alcohol use No    Review of Systems Constitutional: No fever/chills Eyes: No visual changes. ENT: No sore throat. Cardiovascular: Denies chest pain. Respiratory: Positive for shortness of breath. Gastrointestinal: No abdominal pain.  No nausea, no vomiting.  No diarrhea.   Genitourinary: Negative for dysuria. Musculoskeletal: Negative for back  pain. Skin: Negative for rash. Neurological: Negative for headaches, focal weakness or numbness.  ____________________________________________   PHYSICAL EXAM:  VITAL SIGNS: ED Triage Vitals  Enc Vitals Group     BP 04/01/17 1559 (!) 229/170     Pulse Rate 04/01/17 1558 100     Resp 04/01/17 1558 (!) 24     Temp 04/01/17 1558 99.1 F (37.3 C)     Temp Source 04/01/17 1558 Oral     SpO2 04/01/17 1558 96 %     Weight 04/01/17 1558 270 lb (122.5 kg)     Height 04/01/17 1558 6\' 1"  (1.854 m)     Head Circumference --      Peak Flow --      Pain Score 04/01/17 1558 8   Constitutional: Alert and oriented. Well appearing and in no distress. Eyes: Conjunctivae are normal.  ENT   Head: Normocephalic and atraumatic.   Nose: No congestion/rhinnorhea.   Mouth/Throat: Mucous membranes are moist.   Neck: No stridor. Hematological/Lymphatic/Immunilogical: No cervical lymphadenopathy. Cardiovascular: Normal rate, regular rhythm.  No murmurs, rubs, or gallops.  Respiratory: Normal respiratory effort without tachypnea nor retractions. Diffuse rhonchi. Gastrointestinal: Soft and non tender. No rebound. No guarding.  Genitourinary: Deferred Musculoskeletal: Normal range of motion in all extremities. No lower extremity edema. Neurologic:  Normal speech and language. No gross focal neurologic deficits are appreciated.  Skin:  Skin is warm, dry and intact. No rash noted. Psychiatric: Mood and affect are normal. Speech and behavior are normal. Patient exhibits appropriate insight and judgment.  ____________________________________________    LABS (pertinent positives/negatives)  Labs Reviewed  BASIC METABOLIC PANEL - Abnormal; Notable for the following:       Result Value   Potassium 3.0 (*)    Glucose, Bld 103 (*)    BUN 43 (*)    Creatinine, Ser 5.18 (*)    Calcium 8.6 (*)    GFR calc non Af Amer 13 (*)    GFR calc Af Amer 15 (*)    All other components within normal  limits  CBC - Abnormal; Notable for the following:    RBC 4.05 (*)    Hemoglobin 12.0 (*)    HCT 35.4 (*)    RDW 16.3 (*)    All other components within normal limits  TROPONIN I - Abnormal; Notable for the following:    Troponin I 0.11 (*)    All other components within normal limits     ____________________________________________   EKG  I, Nance Pear, attending physician, personally viewed and interpreted this EKG  EKG Time: 1603 Rate: 99 Rhythm: normal sinus rhythm Axis: left axis deviation Intervals: qtc 485 QRS: LVH ST changes: ST elevation V3 Impression: abnormal ekg   ____________________________________________    RADIOLOGY  CXR  IMPRESSION:  CHF    ____________________________________________   PROCEDURES  Procedures  ____________________________________________  INITIAL IMPRESSION / ASSESSMENT AND PLAN / ED COURSE  Pertinent labs & imaging results that were available during my care of the patient were reviewed by me and considered in my medical decision making (see chart for details).  Patient presented to the emergency department today stating he needs dialysis. It has been roughly 1 week since his last dialysis. The patient's x-ray does show edema. Discussed with nephrology who will help arrange dialysis. Patient refused Lasix injection. Patient will be admitted to the medicine service.  ____________________________________________   FINAL CLINICAL IMPRESSION(S) / ED DIAGNOSES  Final diagnoses:  Shortness of breath  Hypertension, unspecified type     Note: This dictation was prepared with Dragon dictation. Any transcriptional errors that result from this process are unintentional     Nance Pear, MD 04/01/17 [id number]

## 2017-04-01 NOTE — ED Notes (Signed)
Pt with acute onset of coughing and shortness of breath, Dr. Archie Balboa in another room at this time, Dr. Owens Shark to bedside.  Pt alert, saturation 95% on room air, pt declines being placed on any oxygen, also refuses lasix, reporting possible allergy.  Episode soon passes, pt able to speak in full sentences.

## 2017-04-01 NOTE — ED Notes (Signed)
Pt given sandwich tray and drink by Dr. Sherald Hess well.

## 2017-04-01 NOTE — H&P (Signed)
Alan Gross is an 31 y.o. male.   Chief Complaint: Shortness of breath HPI: The patient with past medical history of end-stage renal disease on dialysis presents emergency department complaining of shortness of breath. He states that he has missed at least 2 dialysis treatments as he is trying to establish care with the dialysis center closer to his home. He states that he's become progressively more short of breath over the last 4 days. He also has some chest pain which is reproducible and he recognizes as secondary to his increased effort to breathe. Chest x-ray showed pulmonary edema. The patient received a dose of Bumex in the emergency department. He was found to have severely elevated blood pressure. He received multiple doses of antihypertensive medication that improved his SBP to 200. He was placed on a Cardene drip and admitted to the stepdown unit for further management.  Past Medical History:  Diagnosis Date  . Anxiety   . Depression   . Dyspnea   . H/O cardiac catheterization   . History of febrile seizure   . Hypertension   . Irregular heart beats   . Kidney failure    M,W,F [facility] in [place]; 15 %   . Stroke (cerebrum) ([place])    no residual effects    Past Surgical History:  Procedure Laterality Date  . AV FISTULA PLACEMENT Left 09/10/2016   Procedure: LEFT ARTERIOVENOUS (AV) FISTULA CREATION;  Surgeon: Conrad Alpha, MD;  Location: [facility];  Service: Vascular;  Laterality: Left;  . CARDIAC CATHETERIZATION Left 05/02/2015   Procedure: Left Heart Cath and Coronary Angiography;  Surgeon: Dionisio David, MD;  Location: [facility];  Service: Cardiovascular;  Laterality: Left;  . CORONARY ANGIOPLASTY    . PERIPHERAL VASCULAR CATHETERIZATION N/A 07/11/2016   Procedure: Dialysis/Perma Catheter Insertion;  Surgeon: Algernon Huxley, MD;  Location: [facility];  Service: Cardiovascular;  Laterality: N/A;    Family History  Problem Relation Age of Onset  . Hypertension  Other   . Cancer Father        liver  . Hypertension Brother   . Diabetes Paternal Aunt   . Diabetes Paternal Uncle    Social History:  reports that he has been smoking Cigarettes.  He has a 2.50 pack-year smoking history. He has never used smokeless tobacco. He reports that he uses drugs, including Marijuana. He reports that he does not drink alcohol.  Allergies:  Allergies  Allergen Reactions  . Lasix [Furosemide] Shortness Of Breath, Swelling and Anxiety    Medications Prior to Admission  Medication Sig Dispense Refill  . amLODipine (NORVASC) 10 MG tablet Take 10 mg by mouth every evening.     . carvedilol (COREG) 25 MG tablet Take 25 mg by mouth 2 (two) times daily with a meal.    . cloNIDine (CATAPRES) 0.1 MG tablet Take 0.1 mg by mouth 2 (two) times daily.    Marland Kitchen losartan (COZAAR) 100 MG tablet Take 1 tablet (100 mg total) by mouth daily. (Patient taking differently: Take 100 mg by mouth every evening. ) 30 tablet 0  . aspirin 81 MG tablet Take 1 tablet (81 mg total) by mouth daily. (Patient not taking: Reported on 04/01/2017) 30 tablet   . oxyCODONE-acetaminophen (ROXICET) 5-325 MG tablet Take 1 tablet by mouth every 6 (six) hours as needed for severe pain. (Patient not taking: Reported on 11/01/2016) 8 tablet 0    Results for orders placed or performed during the hospital encounter of 04/01/17 (  from the past 48 hour(s))  Basic metabolic panel     Status: Abnormal   Collection Time: 04/01/17  4:25 PM  Result Value Ref Range   Sodium 140 135 - 145 mmol/L   Potassium 3.0 (L) 3.5 - 5.1 mmol/L   Chloride 104 101 - 111 mmol/L   CO2 25 22 - 32 mmol/L   Glucose, Bld 103 (H) 65 - 99 mg/dL   BUN 43 (H) 6 - 20 mg/dL   Creatinine, Ser 5.18 (H) 0.61 - 1.24 mg/dL   Calcium 8.6 (L) 8.9 - 10.3 mg/dL   GFR calc non Af Amer 13 (L) >60 mL/min   GFR calc Af Amer 15 (L) >60 mL/min    Comment: (NOTE) The eGFR has been calculated using the CKD EPI equation. This calculation has not been  validated in all clinical situations. eGFR's persistently <60 mL/min signify possible Chronic Kidney Disease.    Anion gap 11 5 - 15  CBC     Status: Abnormal   Collection Time: 04/01/17  4:25 PM  Result Value Ref Range   WBC 10.1 3.8 - 10.6 K/uL   RBC 4.05 (L) 4.40 - 5.90 MIL/uL   Hemoglobin 12.0 (L) 13.0 - 18.0 g/dL   HCT 35.4 (L) 40.0 - 52.0 %   MCV 87.5 80.0 - 100.0 fL   MCH 29.6 26.0 - 34.0 pg   MCHC 33.9 32.0 - 36.0 g/dL   RDW 16.3 (H) 11.5 - 14.5 %   Platelets 171 150 - 440 K/uL  Troponin I     Status: Abnormal   Collection Time: 04/01/17  4:25 PM  Result Value Ref Range   Troponin I 0.11 (HH) <0.03 ng/mL    Comment: CRITICAL RESULT CALLED TO, READ BACK BY AND VERIFIED WITH CASEY ROBERTS 04/01/17 1703 KLW   Glucose, capillary     Status: Abnormal   Collection Time: 04/01/17 10:32 PM  Result Value Ref Range   Glucose-Capillary 121 (H) 65 - 99 mg/dL   Comment 1 Notify RN    Comment 2 Document in Chart    Dg Chest 2 View  Result Date: 04/01/2017 CLINICAL DATA:  Chest pain and shortness of breath.  Renal failure. EXAM: CHEST  2 VIEW COMPARISON:  07/12/2016 FINDINGS: The right IJ dialysis catheter has been removed. The heart is enlarged. There is vascular congestion, interstitial thickening/ Kerley B-lines, pulmonary edema and small bilateral pleural effusions consistent with CHF. IMPRESSION: CHF Electronically Signed   By: Marijo Sanes M.D.   On: 04/01/2017 16:34    Review of Systems  Constitutional: Negative for chills and fever.  HENT: Negative for sore throat and tinnitus.   Eyes: Negative for blurred vision and redness.  Respiratory: Positive for shortness of breath. Negative for cough.   Cardiovascular: Positive for chest pain. Negative for palpitations, orthopnea and PND.  Gastrointestinal: Negative for abdominal pain, diarrhea, nausea and vomiting.  Genitourinary: Negative for dysuria, frequency and urgency.  Musculoskeletal: Negative for joint pain and myalgias.   Skin: Negative for rash.       No lesions  Neurological: Negative for speech change, focal weakness and weakness.  Endo/Heme/Allergies: Does not bruise/bleed easily.       No temperature intolerance  Psychiatric/Behavioral: Negative for depression and suicidal ideas.    Blood pressure (!) 202/108, pulse (!) 108, temperature 99.5 F (37.5 C), temperature source Oral, resp. rate (!) 31, height 6' 1" (1.854 m), weight 126 kg (277 lb 12.5 oz), SpO2 92 %. Physical Exam  Nursing  note and vitals reviewed. Constitutional: He is oriented to person, place, and time. He appears well-developed and well-nourished. No distress.  HENT:  Head: Normocephalic and atraumatic.  Mouth/Throat: Oropharynx is clear and moist.  Eyes: Conjunctivae and EOM are normal. Pupils are equal, round, and reactive to light. No scleral icterus.  Neck: Normal range of motion. Neck supple. No JVD present. No tracheal deviation present. No thyromegaly present.  Cardiovascular: Regular rhythm, normal heart sounds and intact distal pulses.  Tachycardia present.  Exam reveals no gallop and no friction rub.   No murmur heard. GI: Soft. Bowel sounds are normal. He exhibits no distension. There is no tenderness.  Genitourinary:  Genitourinary Comments: Deferred  Musculoskeletal: Normal range of motion. He exhibits no edema.  Lymphadenopathy:    He has no cervical adenopathy.  Neurological: He is alert and oriented to person, place, and time. He has normal reflexes. No cranial nerve deficit.  Skin: Skin is warm and dry. No rash noted. No erythema.  Psychiatric: He has a normal mood and affect. His behavior is normal. Judgment and thought content normal.     Assessment/Plan This is a 31 year old male admitted for hypertensive urgency. 1. Hypertensive urgency: Systolic blood pressures greater than 570 and diastolic greater than 177. The patient states that he has never seen his blood pressure lower than 200. Started on Cardene  drip. Continue to monitor. Continue clonidine, amlodipine and losartan per home regimen. Hydralazine as needed. 2. End-stage renal disease: On hemodialysis Monday Wednesday Friday. Scheduled to initiate dialysis tomorrow. Nephrology consulted. 3. Elevated troponin: Chest pain is reproducible thus elevated troponin likely secondary to poor renal clearance and demand ischemia. Nonetheless follow cardiac enzymes. Consult cardiology. 4. Fluid overload: Causing pulmonary edema and shortness of breath. The patient makes urine thus we will monitor I and O's. Monitor for improvement following dialysis. 5. DVT prophylaxis: Heparin 6. GI prophylaxis: None The patient is a full code. Time spent on admission orders and critical care approximately 45 minutes  Harrie Foreman, MD 04/01/2017, 11:05 PM

## 2017-04-01 NOTE — ED Notes (Signed)
2nd IV obtained, will start the Cardene drip at this time

## 2017-04-01 NOTE — ED Notes (Signed)
EDP aware of pt's elevated BP. No new orders at this time.

## 2017-04-01 NOTE — ED Triage Notes (Signed)
Refused wheelchair 

## 2017-04-01 NOTE — ED Notes (Addendum)
Pt reports he takes carvedilol, losartan and amlodipine for BP, pt also states he was put on lasix however he stopped taking that medication. Pt reports he last took his BP medication "the day before yest."

## 2017-04-02 LAB — TROPONIN I
TROPONIN I: 0.3 ng/mL — AB (ref <0.03)
TROPONIN I: 0.23 ng/mL — AB (ref <0.03)
Troponin I: 0.27 ng/mL (ref <0.03)

## 2017-04-02 LAB — TSH: TSH: 0.921 u[IU]/mL (ref 0.350–4.500)

## 2017-04-02 LAB — PHOSPHORUS: PHOSPHORUS: 3.3 mg/dL (ref 2.5–4.6)

## 2017-04-02 MED ORDER — TUBERCULIN PPD 5 UNIT/0.1ML ID SOLN
5.0000 [IU] | Freq: Once | INTRADERMAL | Status: DC
  Filled 2017-04-02: qty 0.1

## 2017-04-02 MED ORDER — PENTAFLUOROPROP-TETRAFLUOROETH EX AERO
1.0000 "application " | INHALATION_SPRAY | CUTANEOUS | Status: DC | PRN
  Filled 2017-04-02: qty 30

## 2017-04-02 MED ORDER — HYDRALAZINE HCL 50 MG PO TABS
50.0000 mg | ORAL_TABLET | Freq: Three times a day (TID) | ORAL | Status: DC
  Administered 2017-04-02 – 2017-04-03 (×2): 50 mg via ORAL
  Filled 2017-04-02 (×2): qty 1

## 2017-04-02 MED ORDER — OXYCODONE-ACETAMINOPHEN 5-325 MG PO TABS
1.0000 | ORAL_TABLET | Freq: Once | ORAL | Status: AC
  Administered 2017-04-02: 1 via ORAL

## 2017-04-02 MED ORDER — SODIUM CHLORIDE 0.9 % IV SOLN: 100.0000 mL | INTRAVENOUS | Status: DC | PRN

## 2017-04-02 MED ORDER — LIDOCAINE HCL (PF) 1 % IJ SOLN
5.0000 mL | INTRAMUSCULAR | Status: DC | PRN
  Filled 2017-04-02: qty 5

## 2017-04-02 MED ORDER — LOSARTAN POTASSIUM 50 MG PO TABS: 50.0000 mg | ORAL_TABLET | Freq: Every day | ORAL | Status: DC

## 2017-04-02 MED ORDER — ALTEPLASE 2 MG IJ SOLR
2.0000 mg | Freq: Once | INTRAMUSCULAR | Status: DC | PRN
  Filled 2017-04-02: qty 2

## 2017-04-02 MED ORDER — HEPARIN SODIUM (PORCINE) 1000 UNIT/ML DIALYSIS
1000.0000 [IU] | INTRAMUSCULAR | Status: DC | PRN
  Filled 2017-04-02: qty 1

## 2017-04-02 MED ORDER — LIDOCAINE-PRILOCAINE 2.5-2.5 % EX CREA
1.0000 "application " | TOPICAL_CREAM | CUTANEOUS | Status: DC | PRN
  Filled 2017-04-02: qty 5

## 2017-04-02 MED ORDER — LOSARTAN POTASSIUM 50 MG PO TABS
100.0000 mg | ORAL_TABLET | Freq: Every day | ORAL | Status: DC
  Administered 2017-04-02 – 2017-04-03 (×2): 100 mg via ORAL
  Filled 2017-04-02 (×3): qty 2

## 2017-04-02 NOTE — Progress Notes (Signed)
Pt with transfer orders to 1-A.  Report called to receiving nurse.  Pt left floor via wheelchair with all personal belongings in tow.

## 2017-04-02 NOTE — Progress Notes (Signed)
Pt keeps taking Nasal cannula out of nose, will not leave in place while sleeping.  When Nasal Cannula is removed, pt will desaturate down to 87-90% while asleep.  Since pt received PO antihypertensives, SBP has decreased to 140-160.  Therefore have been titrating down on Cardene gtt.  Pt signs no signs of Hypoperfusion (remains alert and oriented, neurologically intact) with SBP currently at 140-160.

## 2017-04-02 NOTE — Clinical Social Work Note (Signed)
CSW received consult to set up hemodialysis as an outpatient. CSW does not arrange this however, CSW has notified the Dialysis Coordinator, Estill Bamberg of the need. Shela Leff MSW,LcSW [phone]

## 2017-04-02 NOTE — Progress Notes (Signed)
[place] at [place] NAME: Alan Gross    MR#:  [id number]  DATE OF BIRTH:  [birth year]  SUBJECTIVE:   Patient is no longer short of breath. Patient feels Bumex help with shortness of breath. REVIEW OF SYSTEMS:    Review of Systems  Constitutional: Negative for fever, chills weight loss HENT: Negative for ear pain, nosebleeds, congestion, facial swelling, rhinorrhea, neck pain, neck stiffness and ear discharge.   Respiratory: Negative for cough, shortness of breath, wheezing  Cardiovascular: Negative for chest pain, palpitations and leg swelling.  Gastrointestinal: Negative for heartburn, abdominal pain, vomiting, diarrhea or consitpation Genitourinary: Negative for dysuria, urgency, frequency, hematuria Musculoskeletal: Negative for back pain or joint pain Neurological: Negative for dizziness, seizures, syncope, focal weakness,  numbness and headaches.  Hematological: Does not bruise/bleed easily.  Psychiatric/Behavioral: Negative for hallucinations, confusion, dysphoric mood    Tolerating Diet:yes     DRUG ALLERGIES:   Allergies  Allergen Reactions  . Lasix [Furosemide] Shortness Of Breath, Swelling and Anxiety    VITALS:  Blood pressure (!) 164/100, pulse 94, temperature 97.8 F (36.6 C), temperature source Axillary, resp. rate 20, height 6\' 1"  (1.854 m), weight 125.3 kg (276 lb 3.8 oz), SpO2 90 %.  PHYSICAL EXAMINATION:  Constitutional: Appears well-developed and well-nourished. No distress. HENT: Normocephalic. Marland Kitchen Oropharynx is clear and moist.  Eyes: Conjunctivae and EOM are normal. PERRLA, no scleral icterus.  Neck: Normal ROM. Neck supple. No JVD. No tracheal deviation. CVS: RRR, S1/S2 +, no murmurs, no gallops, no carotid bruit.  Pulmonary: Minimal crackles at bases normal respiratory effort no stridor, rhonchi, wheezes, rales.  Abdominal: Soft. BS +,  no distension, tenderness, rebound or guarding.   Musculoskeletal: Normal range of motion. No edema and no tenderness.  Neuro: Alert. CN 2-12 grossly intact. No focal deficits. Skin: Skin is warm and dry. No rash noted. Psychiatric: Normal mood and affect.      LABORATORY PANEL:   CBC  Recent Labs Lab 04/01/17 1625  WBC 10.1  HGB 12.0*  HCT 35.4*  PLT 171   ------------------------------------------------------------------------------------------------------------------  Chemistries   Recent Labs Lab 04/01/17 1625  NA 140  K 3.0*  CL 104  CO2 25  GLUCOSE 103*  BUN 43*  CREATININE 5.18*  CALCIUM 8.6*   ------------------------------------------------------------------------------------------------------------------  Cardiac Enzymes  Recent Labs Lab 04/01/17 1625  TROPONINI 0.11*   ------------------------------------------------------------------------------------------------------------------  RADIOLOGY:  Dg Chest 2 View  Result Date: 04/01/2017 CLINICAL DATA:  Chest pain and shortness of breath.  Renal failure. EXAM: CHEST  2 VIEW COMPARISON:  07/12/2016 FINDINGS: The right IJ dialysis catheter has been removed. The heart is enlarged. There is vascular congestion, interstitial thickening/ Kerley B-lines, pulmonary edema and small bilateral pleural effusions consistent with CHF. IMPRESSION: CHF Electronically Signed   By: Marijo Sanes M.D.   On: 04/01/2017 16:34     ASSESSMENT AND PLAN:   31 year old male with ESRD who presented with hypertensive urgency  1. Hypertensive urgency: Blood pressure has improved from admission Continue Norvasc, Coreg, losartan Wean off nicardipine drip May increase losartan and add hydralazine if needed.  2. ESRD on hemodialysis: Patient reports that he will be going to [place]., Tuesday, Thursday and Saturday. We will need to confirm this with Education officer, museum. Plan for dialysis today which should also help blood pressure   3. Elevated troponin: Ruled out for  NSTEMI This is due to poor renal clearance and demnad ischemia from #1.  4. Tobacco dependence: Patient is encouraged  to quit smoking. Counseling was provided for 4 minutes.  Management plans discussed with the patient and he is in agreement.  CODE STATUS: full  TOTAL TIME TAKING CARE OF THIS PATIENT: 30 minutes.   CSW consult for dispo  POSSIBLE D/C 1-2 days, DEPENDING ON CLINICAL CONDITION.   Tanav Orsak M.D on 04/02/2017 at 8:24 AM  Between 7am to 6pm - Pager - [phone] After 6pm go to [email or web address] - password [email or web address] [facility]  Office  [phone]  CC: Primary care physician; [name], DO  Note: This dictation was prepared with Dragon dictation along with smaller phrase technology. Any transcriptional errors that result from this process are unintentional.

## 2017-04-02 NOTE — Progress Notes (Signed)
Pt alert and oriented, complaint of headache relieved with Oxycodone.  Since receiving PO antihypertensives, SBP decreased to 130, therefore weaned down on dose of Cardene gtt.  Currently on 1 mcg Cardene, with SBP 160-170's.  Pt continues to remain neurologically intact, no signs of hypoperfusion.  Adequate urine output.  Vital signs stable, afebrile.

## 2017-04-02 NOTE — Progress Notes (Signed)
[facility]  ROUNDING NOTE   Subjective:  Patient known to [place] from when he was living in [place] previously. He moved to [place]. However now he has moved back to the area. Patient has not had dialysis in 5 days. Blood pressure is also significantly high.   Objective:  Vital signs in last 24 hours:  Temp:  [97.8 F (36.6 C)-99.5 F (37.5 C)] 97.8 F (36.6 C) (05/16 0500) Pulse Rate:  [91-114] 94 (05/16 0630) Resp:  [17-32] 20 (05/16 0630) BP: (134-270)/(68-188) 164/100 (05/16 0630) SpO2:  [86 %-99 %] 90 % (05/16 0630) Weight:  [122.5 kg (270 lb)-126 kg (277 lb 12.5 oz)] 125.3 kg (276 lb 3.8 oz) (05/16 0500)  Weight change:  Filed Weights   04/01/17 1558 04/01/17 2235 04/02/17 0500  Weight: 122.5 kg (270 lb) 126 kg (277 lb 12.5 oz) 125.3 kg (276 lb 3.8 oz)    Intake/Output: I/O last 3 completed shifts: In: 240 [P.O.:240] Out: 2250 [Urine:2250]   Intake/Output this shift:  No intake/output data recorded.  Physical Exam: General: No acute distress  Head: Normocephalic, atraumatic. Moist oral mucosal membranes  Eyes: Anicteric  Neck: Supple, trachea midline  Lungs:  Clear to auscultation, normal effort  Heart: S1S2 no rubs  Abdomen:  Soft, nontender, bowel sounds present  Extremities:  peripheral edema.  Neurologic: Awake, alert, following commands  Skin: No lesions  Access: Left radial AVF    Basic Metabolic Panel:  Recent Labs Lab 04/01/17 1625  NA 140  K 3.0*  CL 104  CO2 25  GLUCOSE 103*  BUN 43*  CREATININE 5.18*  CALCIUM 8.6*    Liver Function Tests: No results for input(s): AST, ALT, ALKPHOS, BILITOT, PROT, ALBUMIN in the last 168 hours. No results for input(s): LIPASE, AMYLASE in the last 168 hours. No results for input(s): AMMONIA in the last 168 hours.  CBC:  Recent Labs Lab 04/01/17 1625  WBC 10.1  HGB 12.0*  HCT 35.4*  MCV 87.5  PLT 171    Cardiac Enzymes:  Recent Labs Lab 04/01/17 1625  TROPONINI  0.11*    BNP: Invalid input(s): POCBNP  CBG:  Recent Labs Lab 04/01/17 2232  GLUCAP 121*    Microbiology: Results for orders placed or performed during the hospital encounter of 04/01/17  MRSA PCR Screening     Status: None   Collection Time: 04/01/17 10:23 PM  Result Value Ref Range Status   MRSA by PCR NEGATIVE NEGATIVE Final    Comment:        The GeneXpert MRSA Assay (FDA approved for NASAL specimens only), is one component of a comprehensive MRSA colonization surveillance program. It is not intended to diagnose MRSA infection nor to guide or monitor treatment for MRSA infections.     Coagulation Studies: No results for input(s): LABPROT, INR in the last 72 hours.  Urinalysis: No results for input(s): COLORURINE, LABSPEC, PHURINE, GLUCOSEU, HGBUR, BILIRUBINUR, KETONESUR, PROTEINUR, UROBILINOGEN, NITRITE, LEUKOCYTESUR in the last 72 hours.  Invalid input(s): APPERANCEUR    Imaging: Dg Chest 2 View  Result Date: 04/01/2017 CLINICAL DATA:  Chest pain and shortness of breath.  Renal failure. EXAM: CHEST  2 VIEW COMPARISON:  07/12/2016 FINDINGS: The right IJ dialysis catheter has been removed. The heart is enlarged. There is vascular congestion, interstitial thickening/ Kerley B-lines, pulmonary edema and small bilateral pleural effusions consistent with CHF. IMPRESSION: CHF Electronically Signed   By: Marijo Sanes M.D.   On: 04/01/2017 16:34     Medications:   .  niCARDipine 1 mg/hr (04/02/17 0600)   . amLODipine  10 mg Oral QPM  . aspirin  81 mg Oral Daily  . carvedilol  25 mg Oral BID WC  . docusate sodium  100 mg Oral BID  . heparin  5,000 Units Subcutaneous Q8H  . hydrALAZINE  20 mg Intravenous STAT  . losartan  50 mg Oral Daily   acetaminophen **OR** acetaminophen, ondansetron **OR** ondansetron (ZOFRAN) IV, oxyCODONE-acetaminophen  Assessment/ Plan:  31 y.o. male hypertension, childhood seizures, TIA , ESRD on HD since 10/17, anemia of CKD, SHPTH.    1.  ESRD on HD:  Patient was previous to dialyzing in [facility]. He has now relocated back to the [place] area. He was attempting to get reestablished I will units. We will have the dialysis liaison look into this a bit further. Plan for hemodialysis today.  2. Anemia of chronic disease. Hemoglobin currently 12.0. Hold off on Epogen.  3. Secondary hyperparathyroidism. Check intact PTH and phosphorus.  4.  Hypertension. Increasing shortness 100 mg by mouth daily and added hydralazine 50 mg by mouth 3 times a day.    LOS: 1 Tanay Massiah 5/16/20189:34 AM

## 2017-04-02 NOTE — Progress Notes (Signed)
Pt continues to complain of headache 9/10.  Pt was given dose of 1 percocet at 2312, but post administration pt vomited.  Spoke with Lindwood Qua NP of pt headache and episode of vomiting.  Per Bincy, can give 1 percocet once.  Also, pt states his Baseline SBP is around 212.  Per Bincy, titrate Nicardipine gtt for SBP of around 200.

## 2017-04-03 LAB — CBC
HCT: 36.1 % — ABNORMAL LOW (ref 40.0–52.0)
HEMOGLOBIN: 11.9 g/dL — AB (ref 13.0–18.0)
MCH: 29 pg (ref 26.0–34.0)
MCHC: 33.1 g/dL (ref 32.0–36.0)
MCV: 87.7 fL (ref 80.0–100.0)
Platelets: 174 10*3/uL (ref 150–440)
RBC: 4.12 MIL/uL — ABNORMAL LOW (ref 4.40–5.90)
RDW: 16.3 % — AB (ref 11.5–14.5)
WBC: 9 10*3/uL (ref 3.8–10.6)

## 2017-04-03 LAB — HEMOGLOBIN A1C
Hgb A1c MFr Bld: 5.7 % — ABNORMAL HIGH (ref 4.8–5.6)
Mean Plasma Glucose: 117 mg/dL

## 2017-04-03 LAB — BASIC METABOLIC PANEL
Anion gap: 9 (ref 5–15)
BUN: 33 mg/dL — ABNORMAL HIGH (ref 6–20)
CALCIUM: 8.2 mg/dL — AB (ref 8.9–10.3)
CO2: 28 mmol/L (ref 22–32)
CREATININE: 4.68 mg/dL — AB (ref 0.61–1.24)
Chloride: 101 mmol/L (ref 101–111)
GFR calc non Af Amer: 15 mL/min — ABNORMAL LOW (ref >60)
GFR, EST AFRICAN AMERICAN: 17 mL/min — AB (ref >60)
Glucose, Bld: 100 mg/dL — ABNORMAL HIGH (ref 65–99)
Potassium: 2.8 mmol/L — ABNORMAL LOW (ref 3.5–5.1)
SODIUM: 138 mmol/L (ref 135–145)

## 2017-04-03 LAB — HEPATITIS B SURFACE ANTIBODY,QUALITATIVE: HEP B S AB: REACTIVE

## 2017-04-03 LAB — HEPATITIS B SURFACE ANTIGEN: Hepatitis B Surface Ag: NEGATIVE

## 2017-04-03 LAB — PARATHYROID HORMONE, INTACT (NO CA): PTH: 242 pg/mL — AB (ref 15–65)

## 2017-04-03 MED ORDER — HYDRALAZINE HCL 50 MG PO TABS: 50.0000 mg | ORAL_TABLET | Freq: Three times a day (TID) | ORAL | 0 refills | Status: DC

## 2017-04-03 MED ORDER — POTASSIUM CHLORIDE 20 MEQ/15ML (10%) PO SOLN
40.0000 meq | Freq: Once | ORAL | Status: DC
  Filled 2017-04-03: qty 30

## 2017-04-03 MED ORDER — LOSARTAN POTASSIUM 100 MG PO TABS: 100.0000 mg | ORAL_TABLET | Freq: Every evening | ORAL | 0 refills | Status: DC

## 2017-04-03 MED ORDER — CARVEDILOL 25 MG PO TABS: 25.0000 mg | ORAL_TABLET | Freq: Two times a day (BID) | ORAL | 0 refills | Status: DC

## 2017-04-03 NOTE — Progress Notes (Signed)
Patient continues to want to leave.

## 2017-04-03 NOTE — Progress Notes (Signed)
[place] at [place] NAME: Alan Gross    MR#:  [id number]  DATE OF BIRTH:  07-04-1986  SUBJECTIVE:   Patient does not want dialysis today. He wants to stick to Monday, Wednesday and Friday schedule. He wants to go home. Denies shortness of breath or chest pain REVIEW OF SYSTEMS:    Review of Systems  Constitutional: Negative for fever, chills weight loss HENT: Negative for ear pain, nosebleeds, congestion, facial swelling, rhinorrhea, neck pain, neck stiffness and ear discharge.   Respiratory: Negative for cough, shortness of breath, wheezing  Cardiovascular: Negative for chest pain, palpitations and leg swelling.  Gastrointestinal: Negative for heartburn, abdominal pain, vomiting, diarrhea or consitpation Genitourinary: Negative for dysuria, urgency, frequency, hematuria Musculoskeletal: Negative for back pain or joint pain Neurological: Negative for dizziness, seizures, syncope, focal weakness,  numbness and headaches.  Hematological: Does not bruise/bleed easily.  Psychiatric/Behavioral: Negative for hallucinations, confusion, dysphoric mood    Tolerating Diet:yes     DRUG ALLERGIES:   Allergies  Allergen Reactions  . Lasix [Furosemide] Shortness Of Breath, Swelling and Anxiety    VITALS:  Blood pressure (!) 137/111, pulse 71, temperature 98.3 F (36.8 C), temperature source Oral, resp. rate 18, height 6\' 1"  (1.854 m), weight 119.7 kg (264 lb), SpO2 98 %.  PHYSICAL EXAMINATION:  Constitutional: Appears well-developed and well-nourished. No distress. HENT: Normocephalic. Marland Kitchen Oropharynx is clear and moist.  Eyes: Conjunctivae and EOM are normal. PERRLA, no scleral icterus.  Neck: Normal ROM. Neck supple. No JVD. No tracheal deviation. CVS: RRR, S1/S2 +, no murmurs, no gallops, no carotid bruit.  Pulmonary: No audible crackles normal respiratory effort no stridor, rhonchi, wheezes, rales.  Abdominal: Soft. BS +,  no  distension, tenderness, rebound or guarding.  Musculoskeletal: Normal range of motion. No edema and no tenderness.  Neuro: Alert. CN 2-12 grossly intact. No focal deficits. Skin: Skin is warm and dry. No rash noted. Psychiatric: Normal mood and affect.      LABORATORY PANEL:   CBC  Recent Labs Lab 04/03/17 0355  WBC 9.0  HGB 11.9*  HCT 36.1*  PLT 174   ------------------------------------------------------------------------------------------------------------------  Chemistries   Recent Labs Lab 04/03/17 0355  NA 138  K 2.8*  CL 101  CO2 28  GLUCOSE 100*  BUN 33*  CREATININE 4.68*  CALCIUM 8.2*   ------------------------------------------------------------------------------------------------------------------  Cardiac Enzymes  Recent Labs Lab 04/02/17 0950 04/02/17 1422 04/02/17 2026  TROPONINI 0.27* 0.30* 0.23*   ------------------------------------------------------------------------------------------------------------------  RADIOLOGY:  Dg Chest 2 View  Result Date: 04/01/2017 CLINICAL DATA:  Chest pain and shortness of breath.  Renal failure. EXAM: CHEST  2 VIEW COMPARISON:  07/12/2016 FINDINGS: The right IJ dialysis catheter has been removed. The heart is enlarged. There is vascular congestion, interstitial thickening/ Kerley B-lines, pulmonary edema and small bilateral pleural effusions consistent with CHF. IMPRESSION: CHF Electronically Signed   By: Marijo Sanes M.D.   On: 04/01/2017 16:34     ASSESSMENT AND PLAN:   31 year old male with ESRD who presented with hypertensive urgency  1. Hypertensive urgency: Blood pressure has improved from admission Continue Norvasc, Coreg, losartan And hydralazine. Blood pressure medications will need to be just as outpatient. He has been weaned off of  nicardipine drip   2. ESRD on hemodialysis with hypokalemia: Case manager assisting with discharge planning for outpatient dialysis Patient will need  dialysis today as potassium level is low.  3. Elevated troponin: He has been Ruled out for NSTEMI Elevated troponin is  due to poor renal clearance and demand ischemia from #1.  4. Tobacco dependence: Patient is encouraged to quit smoking. Counseling was provided for 4 minutes.  Management plans discussed with the patient and he is in agreement.  CODE STATUS: full  TOTAL TIME TAKING CARE OF THIS PATIENT: 24 minutes.   CSW consult for dispo  POSSIBLE D/C TODAY IF OUTPATIENT DIALYSIS IS SET UP.  Iden Stripling M.D on 04/03/2017 at 8:48 AM  Between 7am to 6pm - Pager - [phone] After 6pm go to [email or web address] - password [email or web address] [facility]  Office  [phone]  CC: Primary care physician; [name], DO  Note: This dictation was prepared with Dragon dictation along with smaller phrase technology. Any transcriptional errors that result from this process are unintentional.

## 2017-04-03 NOTE — Progress Notes (Signed)
Patient discharge summary reviewed with verbal understanding. Elevated BP, MD aware. Alerted patient and stated he was going to leave regardless. Rxs given upon discharge.

## 2017-04-03 NOTE — Care Management (Signed)
RNCM has made several attempts to reach dialysis coordinator without success. Unable to leave VM since it is full. Will continue to try to contact and establish what dialysis arrangements have been made.

## 2017-04-03 NOTE — Progress Notes (Signed)
Patient called this Probation officer and "stated he was going to leave, he couldn't wait any longer."  Education given regarding Hx and current hospital stay. Not received well. Notified MD (Mody). This Probation officer explained he would be leaving against medical advise. Awaiting Nephro MD to round, will reassess after rounding visit.

## 2017-04-03 NOTE — Discharge Summary (Signed)
[place] at [place] NAME: Alan Gross    MR#:  [id number]  DATE OF BIRTH:  06-20-1986  DATE OF ADMISSION:  04/01/2017 ADMITTING PHYSICIAN: Harrie Foreman, MD  DATE OF DISCHARGE: 04/03/2017  PRIMARY CARE PHYSICIAN: [name], DO    ADMISSION DIAGNOSIS:  Shortness of breath [R06.02] Hypertension, unspecified type [I10] Hypertensive urgency [I16.0]  DISCHARGE DIAGNOSIS:  Active Problems:   Hypertensive urgency   SECONDARY DIAGNOSIS:   Past Medical History:  Diagnosis Date  . Anxiety   . Depression   . Dyspnea   . H/O cardiac catheterization   . History of febrile seizure   . Hypertension   . Irregular heart beats   . Kidney failure    M,W,F [facility] in [place]; 15 %   . 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 Hypertensive urgency: Blood pressure has improved from admission Continue Norvasc, Coreg, losartan And hydralazine. Blood pressure medications will need to be just as outpatient. He has been weaned off of  nicardipine drip   2. ESRD on hemodialysis with hypokalemia:He received oral potassium. He has scheduled office Tuesday, Thursday and Saturday. His next dialysis session will be Saturday. Nephrology consultation was appreciated.  3. Elevated troponin: He has been Ruled out for NSTEMI Elevated troponin is  due to poor renal clearance and demand ischemia from #1.  4. Tobacco dependence: Patient is encouraged to quit smoking. Counseling was provided for 4 minutes   DISCHARGE CONDITIONS AND DIET:   Stable for discharge on renal diet  CONSULTS OBTAINED:  Treatment Team:  Anthonette Legato, MD  DRUG ALLERGIES:   Allergies  Allergen Reactions  . Lasix [Furosemide] Shortness Of Breath, Swelling and Anxiety    DISCHARGE MEDICATIONS:   Current Discharge Medication List    START taking  these medications   Details  hydrALAZINE (APRESOLINE) 50 MG tablet Take 1 tablet (50 mg total) by mouth every 8 (eight) hours. Qty: 90 tablet, Refills: 0      CONTINUE these medications which have CHANGED   Details  carvedilol (COREG) 25 MG tablet Take 1 tablet (25 mg total) by mouth 2 (two) times daily with a meal. Qty: 60 tablet, Refills: 0    losartan (COZAAR) 100 MG tablet Take 1 tablet (100 mg total) by mouth every evening. Qty: 30 tablet, Refills: 0      CONTINUE these medications which have NOT CHANGED   Details  amLODipine (NORVASC) 10 MG tablet Take 10 mg by mouth every evening.     aspirin 81 MG tablet Take 1 tablet (81 mg total) by mouth daily. Qty: 30 tablet   Associated Diagnoses: Heart failure, diastolic, due to HTN (HCC)    oxyCODONE-acetaminophen (ROXICET) 5-325 MG tablet Take 1 tablet by mouth every 6 (six) hours as needed for severe pain. Qty: 8 tablet, Refills: 0      STOP taking these medications     cloNIDine (CATAPRES) 0.1 MG tablet           Today   CHIEF COMPLAINT:  Patient wants to go home. Denies shortness of breath or chest pain   VITAL SIGNS:  Blood pressure (!) 137/111, pulse 71, temperature 98.3 F (36.8 C), temperature source Oral, resp. rate 18, height 6\' 1"  (1.854 m), weight 119.7 kg (264 lb), SpO2 98 %.   REVIEW OF SYSTEMS:  Review of Systems  Constitutional:  Negative.  Negative for chills, fever and malaise/fatigue.  HENT: Negative.  Negative for ear discharge, ear pain, hearing loss, nosebleeds and sore throat.   Eyes: Negative.  Negative for blurred vision and pain.  Respiratory: Negative.  Negative for cough, hemoptysis, shortness of breath and wheezing.   Cardiovascular: Negative.  Negative for chest pain, palpitations and leg swelling.  Gastrointestinal: Negative.  Negative for abdominal pain, blood in stool, diarrhea, nausea and vomiting.  Genitourinary: Negative.  Negative for dysuria.  Musculoskeletal: Negative.   Negative for back pain.  Skin: Negative.   Neurological: Negative for dizziness, tremors, speech change, focal weakness, seizures and headaches.  Endo/Heme/Allergies: Negative.  Does not bruise/bleed easily.  Psychiatric/Behavioral: Negative.  Negative for depression, hallucinations and suicidal ideas.     PHYSICAL EXAMINATION:  GENERAL:  31 y.o.-year-old patient lying in the bed with no acute distress.  NECK:  Supple, no jugular venous distention. No thyroid enlargement, no tenderness.  LUNGS: Normal breath sounds bilaterally, no wheezing, rales,rhonchi  No use of accessory muscles of respiration.  CARDIOVASCULAR: S1, S2 normal. No murmurs, rubs, or gallops.  ABDOMEN: Soft, non-tender, non-distended. Bowel sounds present. No organomegaly or mass.  EXTREMITIES: No pedal edema, cyanosis, or clubbing.  PSYCHIATRIC: The patient is alert and oriented x 3.  SKIN: No obvious rash, lesion, or ulcer.   DATA REVIEW:   CBC  Recent Labs Lab 04/03/17 0355  WBC 9.0  HGB 11.9*  HCT 36.1*  PLT 174    Chemistries   Recent Labs Lab 04/03/17 0355  NA 138  K 2.8*  CL 101  CO2 28  GLUCOSE 100*  BUN 33*  CREATININE 4.68*  CALCIUM 8.2*    Cardiac Enzymes  Recent Labs Lab 04/02/17 0950 04/02/17 1422 04/02/17 2026  TROPONINI 0.27* 0.30* 0.23*    Microbiology Results  @[email or web address] @  RADIOLOGY:  Dg Chest 2 View  Result Date: 04/01/2017 CLINICAL DATA:  Chest pain and shortness of breath.  Renal failure. EXAM: CHEST  2 VIEW COMPARISON:  07/12/2016 FINDINGS: The right IJ dialysis catheter has been removed. The heart is enlarged. There is vascular congestion, interstitial thickening/ Kerley B-lines, pulmonary edema and small bilateral pleural effusions consistent with CHF. IMPRESSION: CHF Electronically Signed   By: Marijo Sanes M.D.   On: 04/01/2017 16:34      Current Discharge Medication List    START taking these medications   Details  hydrALAZINE (APRESOLINE) 50 MG  tablet Take 1 tablet (50 mg total) by mouth every 8 (eight) hours. Qty: 90 tablet, Refills: 0      CONTINUE these medications which have CHANGED   Details  carvedilol (COREG) 25 MG tablet Take 1 tablet (25 mg total) by mouth 2 (two) times daily with a meal. Qty: 60 tablet, Refills: 0    losartan (COZAAR) 100 MG tablet Take 1 tablet (100 mg total) by mouth every evening. Qty: 30 tablet, Refills: 0      CONTINUE these medications which have NOT CHANGED   Details  amLODipine (NORVASC) 10 MG tablet Take 10 mg by mouth every evening.     aspirin 81 MG tablet Take 1 tablet (81 mg total) by mouth daily. Qty: 30 tablet   Associated Diagnoses: Heart failure, diastolic, due to HTN (HCC)    oxyCODONE-acetaminophen (ROXICET) 5-325 MG tablet Take 1 tablet by mouth every 6 (six) hours as needed for severe pain. Qty: 8 tablet, Refills: 0      STOP taking these medications     cloNIDine (CATAPRES) 0.1 MG  tablet            Management plans discussed with the patient and he is in agreement. Stable for discharge home  Patient should follow up with pcp  CODE STATUS:     Code Status Orders        Start     Ordered   04/01/17 2222  Full code  Continuous     04/01/17 2221    Code Status History    Date Active Date Inactive Code Status Order ID Comments User Context   07/09/2016  7:50 PM 07/14/2016  2:49 PM Full Code [id number]  Lytle Butte, MD ED   04/16/2016 10:51 PM 04/18/2016  8:14 PM Full Code [id number]  Quintella Baton, MD Inpatient   05/02/2015  7:22 PM 05/04/2015  2:49 PM Full Code [id number]  Demetrios Loll, MD Inpatient   05/02/2015  3:32 PM 05/02/2015  7:22 PM Full Code [id number]  Dionisio David, MD ED      TOTAL TIME TAKING CARE OF THIS PATIENT: 37 minutes.    Note: This dictation was prepared with Dragon dictation along with smaller phrase technology. Any transcriptional errors that result from this process are unintentional.  Linh Johannes M.D on 04/03/2017 at 11:37  AM  Between 7am to 6pm - Pager - [phone] After 6pm go to [email or web address] - password [email or web address] [facility]  Office  [phone]  CC: Primary care physician; [name], DO

## 2017-04-04 LAB — QUANTIFERON IN TUBE
QFT TB AG MINUS NIL VALUE: 0 [IU]/mL
QUANTIFERON MITOGEN VALUE: 7.18 IU/mL
QUANTIFERON TB AG VALUE: 0.07 [IU]/mL
QUANTIFERON TB GOLD: NEGATIVE
Quantiferon Nil Value: 0.07 IU/mL

## 2017-04-04 LAB — QUANTIFERON TB GOLD ASSAY (BLOOD)

## 2017-04-30 ENCOUNTER — Other Ambulatory Visit
Admission: RE | Admit: 2017-04-30 | Discharge: 2017-04-30 | Disposition: A | Discharge status: Home / Self Care | Payer: 59 | Source: Ambulatory Visit | Attending: Nephrology | Admitting: Nephrology

## 2017-04-30 DIAGNOSIS — E876 Hypokalemia: Secondary | ICD-10-CM | POA: Insufficient documentation

## 2017-04-30 LAB — POTASSIUM: POTASSIUM: 3 mmol/L — AB (ref 3.5–5.1)

## 2017-05-06 ENCOUNTER — Encounter (INDEPENDENT_AMBULATORY_CARE_PROVIDER_SITE_OTHER): Payer: Self-pay

## 2017-05-06 ENCOUNTER — Other Ambulatory Visit (INDEPENDENT_AMBULATORY_CARE_PROVIDER_SITE_OTHER): Payer: Self-pay | Admitting: Vascular Surgery

## 2017-05-06 MED ORDER — CEFAZOLIN SODIUM-DEXTROSE 1-4 GM/50ML-% IV SOLN: 1.0000 g | Freq: Once | INTRAVENOUS | Status: DC

## 2017-05-07 ENCOUNTER — Encounter
Admission: RE | Disposition: A | Discharge status: Home / Self Care | Payer: Self-pay | Source: Ambulatory Visit | Attending: Vascular Surgery

## 2017-05-07 ENCOUNTER — Ambulatory Visit
Admission: RE | Admit: 2017-05-07 | Discharge: 2017-05-07 | Disposition: A | Discharge status: Home / Self Care | Payer: 59 | Source: Ambulatory Visit | Attending: Vascular Surgery | Admitting: Vascular Surgery

## 2017-05-07 DIAGNOSIS — Z888 Allergy status to other drugs, medicaments and biological substances status: Secondary | ICD-10-CM | POA: Insufficient documentation

## 2017-05-07 DIAGNOSIS — Z955 Presence of coronary angioplasty implant and graft: Secondary | ICD-10-CM | POA: Insufficient documentation

## 2017-05-07 DIAGNOSIS — T82858A Stenosis of vascular prosthetic devices, implants and grafts, initial encounter: Secondary | ICD-10-CM | POA: Insufficient documentation

## 2017-05-07 DIAGNOSIS — Z992 Dependence on renal dialysis: Secondary | ICD-10-CM | POA: Insufficient documentation

## 2017-05-07 DIAGNOSIS — I10 Essential (primary) hypertension: Secondary | ICD-10-CM | POA: Diagnosis not present

## 2017-05-07 DIAGNOSIS — I120 Hypertensive chronic kidney disease with stage 5 chronic kidney disease or end stage renal disease: Secondary | ICD-10-CM | POA: Diagnosis not present

## 2017-05-07 DIAGNOSIS — I2510 Atherosclerotic heart disease of native coronary artery without angina pectoris: Secondary | ICD-10-CM | POA: Insufficient documentation

## 2017-05-07 DIAGNOSIS — Z8673 Personal history of transient ischemic attack (TIA), and cerebral infarction without residual deficits: Secondary | ICD-10-CM | POA: Diagnosis not present

## 2017-05-07 DIAGNOSIS — F17210 Nicotine dependence, cigarettes, uncomplicated: Secondary | ICD-10-CM | POA: Diagnosis not present

## 2017-05-07 DIAGNOSIS — Z808 Family history of malignant neoplasm of other organs or systems: Secondary | ICD-10-CM | POA: Diagnosis not present

## 2017-05-07 DIAGNOSIS — N186 End stage renal disease: Secondary | ICD-10-CM

## 2017-05-07 DIAGNOSIS — Z833 Family history of diabetes mellitus: Secondary | ICD-10-CM | POA: Insufficient documentation

## 2017-05-07 DIAGNOSIS — Y832 Surgical operation with anastomosis, bypass or graft as the cause of abnormal reaction of the patient, or of later complication, without mention of misadventure at the time of the procedure: Secondary | ICD-10-CM | POA: Insufficient documentation

## 2017-05-07 DIAGNOSIS — Z8249 Family history of ischemic heart disease and other diseases of the circulatory system: Secondary | ICD-10-CM | POA: Insufficient documentation

## 2017-05-07 DIAGNOSIS — Z9889 Other specified postprocedural states: Secondary | ICD-10-CM | POA: Diagnosis not present

## 2017-05-07 DIAGNOSIS — T82868A Thrombosis of vascular prosthetic devices, implants and grafts, initial encounter: Secondary | ICD-10-CM | POA: Diagnosis not present

## 2017-05-07 HISTORY — PX: A/V FISTULAGRAM: CATH118298

## 2017-05-07 HISTORY — PX: PERIPHERAL VASCULAR THROMBECTOMY: CATH118306

## 2017-05-07 LAB — POTASSIUM (ARMC VASCULAR LAB ONLY): POTASSIUM (ARMC VASCULAR LAB): 3.7 (ref 3.5–5.1)

## 2017-05-07 SURGERY — PERIPHERAL VASCULAR THROMBECTOMY
Anesthesia: Moderate Sedation

## 2017-05-07 MED ORDER — MORPHINE SULFATE (PF) 4 MG/ML IV SOLN: 2.0000 mg | INTRAVENOUS | Status: DC | PRN

## 2017-05-07 MED ORDER — METHYLPREDNISOLONE SODIUM SUCC 125 MG IJ SOLR: 125.0000 mg | INTRAMUSCULAR | Status: DC | PRN

## 2017-05-07 MED ORDER — MIDAZOLAM HCL 5 MG/5ML IJ SOLN
INTRAMUSCULAR | Status: AC
  Filled 2017-05-07: qty 5

## 2017-05-07 MED ORDER — HYDROMORPHONE HCL 1 MG/ML IJ SOLN: 1.0000 mg | Freq: Once | INTRAMUSCULAR | Status: DC | PRN

## 2017-05-07 MED ORDER — ONDANSETRON HCL 4 MG/2ML IJ SOLN: 4.0000 mg | Freq: Four times a day (QID) | INTRAMUSCULAR | Status: DC | PRN

## 2017-05-07 MED ORDER — METOPROLOL TARTRATE 5 MG/5ML IV SOLN: 2.0000 mg | INTRAVENOUS | Status: DC | PRN

## 2017-05-07 MED ORDER — FENTANYL CITRATE (PF) 100 MCG/2ML IJ SOLN
INTRAMUSCULAR | Status: DC | PRN
  Administered 2017-05-07 (×2): 50 ug via INTRAVENOUS

## 2017-05-07 MED ORDER — ACETAMINOPHEN 325 MG RE SUPP
325.0000 mg | RECTAL | Status: DC | PRN
  Filled 2017-05-07: qty 2

## 2017-05-07 MED ORDER — MIDAZOLAM HCL 2 MG/2ML IJ SOLN
INTRAMUSCULAR | Status: DC | PRN
  Administered 2017-05-07: 1 mg via INTRAVENOUS
  Administered 2017-05-07: 2 mg via INTRAVENOUS

## 2017-05-07 MED ORDER — LIDOCAINE-EPINEPHRINE (PF) 2 %-1:200000 IJ SOLN
INTRAMUSCULAR | Status: AC
Start: 2017-05-07 — End: 2017-05-07
  Filled 2017-05-07: qty 20

## 2017-05-07 MED ORDER — HYDRALAZINE HCL 20 MG/ML IJ SOLN: 5.0000 mg | INTRAMUSCULAR | Status: DC | PRN

## 2017-05-07 MED ORDER — ACETAMINOPHEN 325 MG PO TABS
325.0000 mg | ORAL_TABLET | ORAL | Status: DC | PRN
Start: 2017-05-07 — End: 2017-05-07

## 2017-05-07 MED ORDER — GUAIFENESIN-DM 100-10 MG/5ML PO SYRP
15.0000 mL | ORAL_SOLUTION | ORAL | Status: DC | PRN
  Filled 2017-05-07: qty 15

## 2017-05-07 MED ORDER — HEPARIN SODIUM (PORCINE) 1000 UNIT/ML IJ SOLN
INTRAMUSCULAR | Status: AC
  Filled 2017-05-07: qty 1

## 2017-05-07 MED ORDER — LABETALOL HCL 5 MG/ML IV SOLN
INTRAVENOUS | Status: DC | PRN
Start: 2017-05-07 — End: 2017-05-07
  Administered 2017-05-07 (×2): 10 mg via INTRAVENOUS

## 2017-05-07 MED ORDER — LABETALOL HCL 5 MG/ML IV SOLN: 10.0000 mg | INTRAVENOUS | Status: DC | PRN

## 2017-05-07 MED ORDER — CEFAZOLIN SODIUM-DEXTROSE 2-4 GM/100ML-% IV SOLN
2.0000 g | Freq: Once | INTRAVENOUS | Status: AC
  Administered 2017-05-07: 2 g via INTRAVENOUS

## 2017-05-07 MED ORDER — HEPARIN (PORCINE) IN NACL 2-0.9 UNIT/ML-% IJ SOLN
INTRAMUSCULAR | Status: AC
  Filled 2017-05-07: qty 1000

## 2017-05-07 MED ORDER — SODIUM CHLORIDE 0.9 % IV SOLN: 500.0000 mL | Freq: Once | INTRAVENOUS | Status: DC | PRN

## 2017-05-07 MED ORDER — FENTANYL CITRATE (PF) 100 MCG/2ML IJ SOLN
INTRAMUSCULAR | Status: AC
  Filled 2017-05-07: qty 2

## 2017-05-07 MED ORDER — SODIUM CHLORIDE 0.9 % IV SOLN
INTRAVENOUS | Status: DC
  Administered 2017-05-07: 11:00:00 via INTRAVENOUS

## 2017-05-07 MED ORDER — FAMOTIDINE 20 MG PO TABS: 40.0000 mg | ORAL_TABLET | ORAL | Status: DC | PRN

## 2017-05-07 MED ORDER — PHENOL 1.4 % MT LIQD
1.0000 | OROMUCOSAL | Status: DC | PRN
  Filled 2017-05-07: qty 177

## 2017-05-07 MED ORDER — IOPAMIDOL (ISOVUE-300) INJECTION 61%
INTRAVENOUS | Status: DC | PRN
  Administered 2017-05-07: 25 mL via INTRA_ARTERIAL

## 2017-05-07 MED ORDER — ALUM & MAG HYDROXIDE-SIMETH 200-200-20 MG/5ML PO SUSP: 15.0000 mL | ORAL | Status: DC | PRN

## 2017-05-07 MED ORDER — HEPARIN SODIUM (PORCINE) 1000 UNIT/ML IJ SOLN
INTRAMUSCULAR | Status: DC | PRN
  Administered 2017-05-07: 3000 [IU] via INTRAVENOUS

## 2017-05-07 MED ORDER — OXYCODONE-ACETAMINOPHEN 5-325 MG PO TABS: 1.0000 | ORAL_TABLET | ORAL | Status: DC | PRN

## 2017-05-07 SURGICAL SUPPLY — 15 items
BALLN DORADO 7X40X80 (BALLOONS) ×3
BALLN LUTONIX DCB 5X80X130 (BALLOONS) ×3
BALLOON DORADO 7X40X80 (BALLOONS) ×2 IMPLANT
BALLOON LUTONIX DCB 5X80X130 (BALLOONS) ×2 IMPLANT
CANNULA 5F STIFF (CANNULA) ×3 IMPLANT
CATH BEACON 5 .035 40 KMP TP (CATHETERS) ×2 IMPLANT
CATH BEACON 5 .038 40 KMP TP (CATHETERS) ×1
DEVICE PRESTO INFLATION (MISCELLANEOUS) ×3 IMPLANT
DRAPE BRACHIAL (DRAPES) ×3 IMPLANT
PACK ANGIOGRAPHY (CUSTOM PROCEDURE TRAY) ×3 IMPLANT
SHEATH BRITE TIP 6FRX5.5 (SHEATH) ×3 IMPLANT
SUT MNCRL AB 4-0 PS2 18 (SUTURE) ×3 IMPLANT
TOWEL OR 17X26 4PK STRL BLUE (TOWEL DISPOSABLE) ×3 IMPLANT
TUBING CONTRAST HIGH PRESS 72 (TUBING) ×3 IMPLANT
WIRE MAGIC TOR.035 180C (WIRE) ×3 IMPLANT

## 2017-05-07 NOTE — Progress Notes (Signed)
Escorted pt. To front of hospital & watched pt. Get in mother"s car. Left arm fistula site clean, dry, intact wihout complications at site. Stable for DC home. Pt. Voided x 1 before leaving.

## 2017-05-07 NOTE — Op Note (Signed)
[facility] AND VASCULAR SURGERY    OPERATIVE NOTE   PROCEDURE: 1.   Left radiocephalic arteriovenous fistula cannulation under ultrasound guidance 2.   Left arm fistulagram including central venogram 3.   Percutaneous transluminal angioplasty of the perianastomotic cephalic Gross with 5 mm diameter drug coated and 7 mm high pressure angioplasty balloon  PRE-OPERATIVE DIAGNOSIS: 1. ESRD 2. Poorly functional left radiocephalic AVF  POST-OPERATIVE DIAGNOSIS: same as above   SURGEON: Leotis Pain, MD  ANESTHESIA: local with MCS  ESTIMATED BLOOD LOSS: minimal  FINDING(S): 1. Perianastomotic stenosis in the cephalic Gross over several centimeters with the highest degree of stenosis being in the 75-80% range. The outflow in the upper arm is largely through the basilic Gross but was widely patent and the central venous circulation was patent.  SPECIMEN(S):  None  CONTRAST: 25 cc  FLUORO TIME: 2.7 minutes  MODERATE CONSCIOUS SEDATION TIME: Approximately 20 minutes with 3 mg of Versed and 100 mcg of Fentanyl   INDICATIONS: Alan Gross is a 31 y.o. male who presents with malfunctioning left radiocephalic arteriovenous fistula. The dialysis center was unable to access this and feared it was clotted. The patient is scheduled for left arm fistulagram.  The patient is aware the risks include but are not limited to: bleeding, infection, thrombosis of the cannulated access, and possible anaphylactic reaction to the contrast.  The patient is aware of the risks of the procedure and elects to proceed forward.  DESCRIPTION: After full informed written consent was obtained, the patient was brought back to the angiography suite and placed supine upon the angiography table.  The patient was connected to monitoring equipment. Moderate conscious sedation was administered with a face to face encounter with the patient throughout the procedure with my supervision of the RN administering medicines and  monitoring the patient's vital signs and mental status throughout from the start of the procedure until the patient was taken to the recovery room. The left arm was prepped and draped in the standard fashion for a percutaneous access intervention.  Under ultrasound guidance, the left radiocephalic arteriovenous fistula was cannulated with a micropuncture needle under direct ultrasound guidance in a retrograde fashion just below the antecubital fossa and a permanent image was performed.  The microwire was advanced into the fistula and the needle was exchanged for the a microsheath.  I then upsized to a 6 Fr Sheath and imaging was performed. A Kumpe catheter and a Magic torque wire were used to cross the anastomosis and get the catheter just into the radial artery.  Hand injections were completed to image the access including the central venous system. This demonstrated perianastomotic stenosis in the cephalic Gross over several centimeters with the highest degree of stenosis being in the 75-80% range. The outflow in the upper arm is largely through the basilic Gross but was widely patent and the central venous circulation was patent.  Based on the images, this patient will need intervention to the cephalic Gross just beyond the anastomosis. I then gave the patient 3000 units of intravenous heparin.  I then crossed the stenosis with a Magic Tourqe wire.  Based on the imaging, a 5 mm x 6 cm  Lutonix drug-coated angioplasty balloon was selected.  The balloon was centered around the perianastomotic cephalic Gross stenosis and inflated to 10 ATM for 1 minute(s). The area immediately at the anastomosis now had only about a 30% residual stenosis, but there was still a greater than 50% residual stenosis about 4-5 cm beyond  the anastomosis at the level of the first venous branch. Being further away from the anastomosis, I upsized to a 7 mm diameter by 4 cm length high pressure angioplasty balloon and inflated this to 18 atm in  this area. The inflation was held for about 1 minute. On completion imaging, a 20-25 % residual stenosis was present.     Based on the completion imaging, no further intervention is necessary.  The wire and balloon were removed from the sheath.  A 4-0 Monocryl purse-string suture was sewn around the sheath.  The sheath was removed while tying down the suture.  A sterile bandage was applied to the puncture site.  COMPLICATIONS: None  CONDITION: Stable   Leotis Pain  05/07/2017 12:13 PM   This note was created with Dragon Medical transcription system. Any errors in dictation are purely unintentional.

## 2017-05-07 NOTE — Progress Notes (Signed)
Pt.stating he has no ride home until 6 pm tonight. Mgr. Kevan Ny, RN, PhD and nsg. Supervisor informed. Taxi called, then pt. Made phone calls to arrange a ride for himself.

## 2017-05-07 NOTE — H&P (Signed)
[facility] Admission History & Physical  MRN : [id number]  Alan Gross is a 31 y.o. ([birth year]) male who presents with chief complaint of No chief complaint on file. Marland Kitchen  History of Present Illness: I am asked to evaluate the patient by the dialysis center. The patient was sent here because they were unable to cannulate the access this morning. Furthermore the Center states there is no thrill or bruit. The patient states this is the first dialysis run to be missed. This problem is acute in onset and has been present for approximately 2 days. The patient is unaware of any other change.  Patient denies pain or tenderness overlying the access.  There is no pain with dialysis.  The patient denies hand pain or finger pain consistent with steal syndrome.   There have not been recent past interventions or declots of this access.  The patient is not chronically hypotensive on dialysis.  Current Facility-Administered Medications  Medication Dose Route Frequency Provider Last Rate Last Dose  . 0.9 %  sodium chloride infusion   Intravenous Continuous Stegmayer, Kimberly A, PA-C 10 mL/hr at 05/07/17 1058    . ceFAZolin (ANCEF) IVPB 2g/100 mL premix  2 g Intravenous Once Algernon Huxley, MD      . famotidine (PEPCID) tablet 40 mg  40 mg Oral PRN Stegmayer, Janalyn Harder, PA-C      . HYDROmorphone (DILAUDID) injection 1 mg  1 mg Intravenous Once PRN Stegmayer, Kimberly A, PA-C      . methylPREDNISolone sodium succinate (SOLU-MEDROL) 125 mg/2 mL injection 125 mg  125 mg Intravenous PRN Stegmayer, Kimberly A, PA-C      . ondansetron (ZOFRAN) injection 4 mg  4 mg Intravenous Q6H PRN Stegmayer, Janalyn Harder, PA-C        Past Medical History:  Diagnosis Date  . Anxiety   . Depression   . Dyspnea   . H/O cardiac catheterization   . History of febrile seizure   . Hypertension   . Irregular heart beats   . Kidney failure    M,W,F [facility] in [place]; 15 %   . Stroke (cerebrum) ([place])     no residual effects    Past Surgical History:  Procedure Laterality Date  . AV FISTULA PLACEMENT Left 09/10/2016   Procedure: LEFT ARTERIOVENOUS (AV) FISTULA CREATION;  Surgeon: Conrad [place], MD;  Location: [facility];  Service: Vascular;  Laterality: Left;  . CARDIAC CATHETERIZATION Left 05/02/2015   Procedure: Left Heart Cath and Coronary Angiography;  Surgeon: Dionisio David, MD;  Location: [facility];  Service: Cardiovascular;  Laterality: Left;  . CORONARY ANGIOPLASTY    . PERIPHERAL VASCULAR CATHETERIZATION N/A 07/11/2016   Procedure: Dialysis/Perma Catheter Insertion;  Surgeon: Algernon Huxley, MD;  Location: [facility];  Service: Cardiovascular;  Laterality: N/A;    Social History Social History  Substance Use Topics  . Smoking status: Current Some Day Smoker    Packs/day: 0.25    Years: 10.00    Types: Cigarettes    Last attempt to quit: 06/22/2016  . Smokeless tobacco: Never Used     Comment: smokes 1-2 cigarettes daily  . Alcohol use No    Family History Family History  Problem Relation Age of Onset  . Hypertension Other   . Cancer Father        liver  . Hypertension Brother   . Diabetes Paternal Aunt   . Diabetes Paternal Uncle  No family history of bleeding or clotting disorders, autoimmune disease or porphyria  Allergies  Allergen Reactions  . Lasix [Furosemide] Shortness Of Breath, Swelling and Anxiety     REVIEW OF SYSTEMS (Negative unless checked)  Constitutional: [] Weight loss  [] Fever  [] Chills Cardiac: [] Chest pain   [] Chest pressure   [x] Palpitations   [] Shortness of breath when laying flat   [] Shortness of breath at rest   [x] Shortness of breath with exertion. Vascular:  [] Pain in legs with walking   [] Pain in legs at rest   [] Pain in legs when laying flat   [] Claudication   [] Pain in feet when walking  [] Pain in feet at rest  [] Pain in feet when laying flat   [] History of DVT   [] Phlebitis   [] Swelling in legs   [] Varicose veins    [] Non-healing ulcers Pulmonary:   [] Uses home oxygen   [] Productive cough   [] Hemoptysis   [] Wheeze  [] COPD   [] Asthma Neurologic:  [] Dizziness  [] Blackouts   [] Seizures   [] History of stroke   [] History of TIA  [] Aphasia   [] Temporary blindness   [] Dysphagia   [] Weakness or numbness in arms   [] Weakness or numbness in legs Musculoskeletal:  [] Arthritis   [] Joint swelling   [] Joint pain   [] Low back pain Hematologic:  [] Easy bruising  [] Easy bleeding   [] Hypercoagulable state   [] Anemic  [] Hepatitis Gastrointestinal:  [] Blood in stool   [] Vomiting blood  [] Gastroesophageal reflux/heartburn   [] Difficulty swallowing. Genitourinary:  [x] Chronic kidney disease   [] Difficult urination  [] Frequent urination  [] Burning with urination   [] Blood in urine Skin:  [] Rashes   [] Ulcers   [] Wounds Psychological:  [] History of anxiety   []  History of major depression.  Physical Examination  Vitals:   05/07/17 1038  BP: (!) 177/106  Pulse: 83  Resp: 17  Temp: 98.5 F (36.9 C)  TempSrc: Oral  SpO2: 97%  Weight: 115.7 kg (255 lb)  Height: 6\' 1"  (1.854 m)   Body mass index is 33.64 kg/m. Gen: WD/WN, NAD Head: [place]/AT, No temporalis wasting. Ear/Nose/Throat: Hearing grossly intact, nares w/o erythema or drainage, oropharynx w/o Erythema/Exudate,  Eyes: Conjunctiva clear, sclera non-icteric Neck: Trachea midline.  No JVD.  Pulmonary:  Good air movement, respirations not labored, no use of accessory muscles.  Cardiac: RRR, normal S1, S2. Vascular: no thrill in left arm access Vessel Right Left  Radial Palpable Palpable  Ulnar Not Palpable Not Palpable  Brachial Palpable Palpable  Carotid Palpable, without bruit Palpable, without bruit  Gastrointestinal: soft, non-tender/non-distended. No guarding/reflex.  Musculoskeletal: M/S 5/5 throughout.  Extremities without ischemic changes.  No deformity or atrophy.  Neurologic: Sensation grossly intact in extremities.  Symmetrical.  Speech is fluent. Motor  exam as listed above. Psychiatric: Judgment intact, Mood & affect appropriate for pt's clinical situation. Dermatologic: No rashes or ulcers noted.  No cellulitis or open wounds.    CBC Lab Results  Component Value Date   WBC 9.0 04/03/2017   HGB 11.9 (L) 04/03/2017   HCT 36.1 (L) 04/03/2017   MCV 87.7 04/03/2017   PLT 174 04/03/2017    BMET    Component Value Date/Time   NA 138 04/03/2017 0355   NA 139 02/14/2015 1944   K 3.0 (L) 04/30/2017 1053   K 3.4 (L) 02/14/2015 1944   CL 101 04/03/2017 0355   CL 101 02/14/2015 1944   CO2 28 04/03/2017 0355   CO2 32 02/14/2015 1944   GLUCOSE 100 (H) 04/03/2017 0355  GLUCOSE 105 (H) 02/14/2015 1944   BUN 33 (H) 04/03/2017 0355   BUN 13 02/14/2015 1944   CREATININE 4.68 (H) 04/03/2017 0355   CREATININE 1.12 02/14/2015 1944   CALCIUM 8.2 (L) 04/03/2017 0355   CALCIUM 9.4 02/14/2015 1944   GFRNONAA 15 (L) 04/03/2017 0355   GFRNONAA >60 02/14/2015 1944   GFRAA 17 (L) 04/03/2017 0355   GFRAA >60 02/14/2015 1944   CrCl cannot be calculated (Patient's most recent lab result is older than the maximum 21 days allowed.).  COAG Lab Results  Component Value Date   INR 1.0 05/13/2012    Radiology No results found.  Assessment/Plan 1.  Complication dialysis device with thrombosis AV access:  Patient's left arm dialysis access appears thrombosed. The patient will undergo thrombectomy using interventional techniques.  The risks and benefits were described to the patient.  All questions were answered.  The patient agrees to proceed with angiography and intervention. Potassium will be drawn to ensure that it is an appropriate level prior to performing thrombectomy. 2.  End-stage renal disease requiring hemodialysis:  Patient will continue dialysis therapy without further interruption if a successful thrombectomy is not achieved then catheter will be placed. Dialysis has already been arranged since the patient missed their previous  session 3.  Hypertension:  Patient will continue medical management; nephrology is following no changes in oral medications. 4.  Coronary artery disease:  EKG will be monitored. Nitrates will be used if needed. The patient's oral cardiac medications will be continued.    Leotis Pain, MD  05/07/2017 11:10 AM

## 2017-06-20 ENCOUNTER — Encounter (INDEPENDENT_AMBULATORY_CARE_PROVIDER_SITE_OTHER): Payer: Self-pay | Admitting: Vascular Surgery

## 2017-06-20 ENCOUNTER — Encounter (INDEPENDENT_AMBULATORY_CARE_PROVIDER_SITE_OTHER): Payer: 59

## 2017-07-09 ENCOUNTER — Encounter: Payer: Self-pay | Admitting: Emergency Medicine

## 2017-07-09 ENCOUNTER — Inpatient Hospital Stay
Admission: EM | Admit: 2017-07-09 | Discharge: 2017-07-10 | DRG: 189 | Disposition: A | Discharge status: Home / Self Care | Payer: 59 | Attending: Pulmonary Disease | Admitting: Pulmonary Disease

## 2017-07-09 ENCOUNTER — Emergency Department: Payer: 59

## 2017-07-09 DIAGNOSIS — I132 Hypertensive heart and chronic kidney disease with heart failure and with stage 5 chronic kidney disease, or end stage renal disease: Secondary | ICD-10-CM | POA: Diagnosis present

## 2017-07-09 DIAGNOSIS — Z833 Family history of diabetes mellitus: Secondary | ICD-10-CM | POA: Diagnosis not present

## 2017-07-09 DIAGNOSIS — E669 Obesity, unspecified: Secondary | ICD-10-CM | POA: Diagnosis present

## 2017-07-09 DIAGNOSIS — E876 Hypokalemia: Secondary | ICD-10-CM | POA: Diagnosis present

## 2017-07-09 DIAGNOSIS — I161 Hypertensive emergency: Secondary | ICD-10-CM

## 2017-07-09 DIAGNOSIS — Z9861 Coronary angioplasty status: Secondary | ICD-10-CM

## 2017-07-09 DIAGNOSIS — N2581 Secondary hyperparathyroidism of renal origin: Secondary | ICD-10-CM | POA: Diagnosis present

## 2017-07-09 DIAGNOSIS — F419 Anxiety disorder, unspecified: Secondary | ICD-10-CM | POA: Diagnosis present

## 2017-07-09 DIAGNOSIS — Z6835 Body mass index (BMI) 35.0-35.9, adult: Secondary | ICD-10-CM

## 2017-07-09 DIAGNOSIS — I5033 Acute on chronic diastolic (congestive) heart failure: Secondary | ICD-10-CM | POA: Diagnosis present

## 2017-07-09 DIAGNOSIS — I160 Hypertensive urgency: Secondary | ICD-10-CM | POA: Diagnosis present

## 2017-07-09 DIAGNOSIS — Z8249 Family history of ischemic heart disease and other diseases of the circulatory system: Secondary | ICD-10-CM

## 2017-07-09 DIAGNOSIS — R000 Tachycardia, unspecified: Secondary | ICD-10-CM | POA: Diagnosis present

## 2017-07-09 DIAGNOSIS — I248 Other forms of acute ischemic heart disease: Secondary | ICD-10-CM | POA: Diagnosis present

## 2017-07-09 DIAGNOSIS — Z8673 Personal history of transient ischemic attack (TIA), and cerebral infarction without residual deficits: Secondary | ICD-10-CM | POA: Diagnosis not present

## 2017-07-09 DIAGNOSIS — D631 Anemia in chronic kidney disease: Secondary | ICD-10-CM | POA: Diagnosis present

## 2017-07-09 DIAGNOSIS — Z888 Allergy status to other drugs, medicaments and biological substances status: Secondary | ICD-10-CM

## 2017-07-09 DIAGNOSIS — F17210 Nicotine dependence, cigarettes, uncomplicated: Secondary | ICD-10-CM | POA: Diagnosis present

## 2017-07-09 DIAGNOSIS — R0902 Hypoxemia: Secondary | ICD-10-CM | POA: Diagnosis present

## 2017-07-09 DIAGNOSIS — N186 End stage renal disease: Secondary | ICD-10-CM | POA: Diagnosis present

## 2017-07-09 DIAGNOSIS — Z800 Family history of malignant neoplasm of digestive organs: Secondary | ICD-10-CM

## 2017-07-09 DIAGNOSIS — Z992 Dependence on renal dialysis: Secondary | ICD-10-CM

## 2017-07-09 DIAGNOSIS — J810 Acute pulmonary edema: Secondary | ICD-10-CM | POA: Diagnosis not present

## 2017-07-09 DIAGNOSIS — J9601 Acute respiratory failure with hypoxia: Principal | ICD-10-CM | POA: Diagnosis present

## 2017-07-09 DIAGNOSIS — Z79899 Other long term (current) drug therapy: Secondary | ICD-10-CM | POA: Diagnosis not present

## 2017-07-09 DIAGNOSIS — R7989 Other specified abnormal findings of blood chemistry: Secondary | ICD-10-CM | POA: Diagnosis present

## 2017-07-09 DIAGNOSIS — I214 Non-ST elevation (NSTEMI) myocardial infarction: Secondary | ICD-10-CM

## 2017-07-09 LAB — CBC
HCT: 34.9 % — ABNORMAL LOW (ref 40.0–52.0)
Hemoglobin: 11.5 g/dL — ABNORMAL LOW (ref 13.0–18.0)
MCH: 27.4 pg (ref 26.0–34.0)
MCHC: 33.1 g/dL (ref 32.0–36.0)
MCV: 82.8 fL (ref 80.0–100.0)
PLATELETS: 155 10*3/uL (ref 150–440)
RBC: 4.21 MIL/uL — AB (ref 4.40–5.90)
RDW: 16.4 % — ABNORMAL HIGH (ref 11.5–14.5)
WBC: 9.2 10*3/uL (ref 3.8–10.6)

## 2017-07-09 LAB — URINE DRUG SCREEN, QUALITATIVE (ARMC ONLY)
Amphetamines, Ur Screen: NOT DETECTED
Barbiturates, Ur Screen: NOT DETECTED
Benzodiazepine, Ur Scrn: NOT DETECTED
Cannabinoid 50 Ng, Ur ~~LOC~~: POSITIVE — AB
Cocaine Metabolite,Ur ~~LOC~~: NOT DETECTED
MDMA (Ecstasy)Ur Screen: NOT DETECTED
Methadone Scn, Ur: NOT DETECTED
Opiate, Ur Screen: NOT DETECTED
Phencyclidine (PCP) Ur S: NOT DETECTED
Tricyclic, Ur Screen: NOT DETECTED

## 2017-07-09 LAB — TROPONIN I
TROPONIN I: 0.1 ng/mL — AB (ref <0.03)
TROPONIN I: 0.09 ng/mL — AB (ref <0.03)
Troponin I: 0.16 ng/mL (ref <0.03)

## 2017-07-09 LAB — MRSA PCR SCREENING: MRSA by PCR: NEGATIVE

## 2017-07-09 LAB — BASIC METABOLIC PANEL WITH GFR
Anion gap: 9 (ref 5–15)
BUN: 33 mg/dL — ABNORMAL HIGH (ref 6–20)
CO2: 28 mmol/L (ref 22–32)
Calcium: 8.9 mg/dL (ref 8.9–10.3)
Chloride: 98 mmol/L — ABNORMAL LOW (ref 101–111)
Creatinine, Ser: 5.37 mg/dL — ABNORMAL HIGH (ref 0.61–1.24)
GFR calc Af Amer: 15 mL/min — ABNORMAL LOW
GFR calc non Af Amer: 13 mL/min — ABNORMAL LOW
Glucose, Bld: 114 mg/dL — ABNORMAL HIGH (ref 65–99)
Potassium: 3.5 mmol/L (ref 3.5–5.1)
Sodium: 135 mmol/L (ref 135–145)

## 2017-07-09 LAB — PROTIME-INR
INR: 1.16
PROTHROMBIN TIME: 14.9 s (ref 11.4–15.2)

## 2017-07-09 LAB — HEPATIC FUNCTION PANEL
ALT: 16 U/L — ABNORMAL LOW (ref 17–63)
AST: 22 U/L (ref 15–41)
Albumin: 3.6 g/dL (ref 3.5–5.0)
Alkaline Phosphatase: 62 U/L (ref 38–126)
Bilirubin, Direct: 0.1 mg/dL (ref 0.1–0.5)
Indirect Bilirubin: 0.5 mg/dL (ref 0.3–0.9)
Total Bilirubin: 0.6 mg/dL (ref 0.3–1.2)
Total Protein: 6.6 g/dL (ref 6.5–8.1)

## 2017-07-09 LAB — BRAIN NATRIURETIC PEPTIDE: B Natriuretic Peptide: 3509 pg/mL — ABNORMAL HIGH (ref 0.0–100.0)

## 2017-07-09 LAB — GLUCOSE, CAPILLARY: Glucose-Capillary: 118 mg/dL — ABNORMAL HIGH (ref 65–99)

## 2017-07-09 LAB — APTT: APTT: 37 s — AB (ref 24–36)

## 2017-07-09 MED ORDER — ASPIRIN EC 81 MG PO TBEC
81.0000 mg | DELAYED_RELEASE_TABLET | Freq: Every day | ORAL | Status: DC
  Administered 2017-07-10: 81 mg via ORAL
  Filled 2017-07-09: qty 1

## 2017-07-09 MED ORDER — BISACODYL 5 MG PO TBEC
5.0000 mg | DELAYED_RELEASE_TABLET | Freq: Every day | ORAL | Status: DC | PRN
  Filled 2017-07-09: qty 1

## 2017-07-09 MED ORDER — LOSARTAN POTASSIUM 50 MG PO TABS
100.0000 mg | ORAL_TABLET | Freq: Every evening | ORAL | Status: DC
  Administered 2017-07-09: 100 mg via ORAL
  Filled 2017-07-09: qty 2

## 2017-07-09 MED ORDER — ACETAMINOPHEN 325 MG PO TABS
650.0000 mg | ORAL_TABLET | Freq: Once | ORAL | Status: AC
  Administered 2017-07-09: 650 mg via ORAL
  Filled 2017-07-09: qty 2

## 2017-07-09 MED ORDER — ACETAMINOPHEN 650 MG RE SUPP: 650.0000 mg | Freq: Four times a day (QID) | RECTAL | Status: DC | PRN

## 2017-07-09 MED ORDER — HEPARIN SODIUM (PORCINE) 5000 UNIT/ML IJ SOLN
4000.0000 [IU] | Freq: Once | INTRAMUSCULAR | Status: AC
Start: 2017-07-09 — End: 2017-07-09
  Administered 2017-07-09: 4000 [IU] via INTRAVENOUS

## 2017-07-09 MED ORDER — LABETALOL HCL 5 MG/ML IV SOLN
10.0000 mg | Freq: Once | INTRAVENOUS | Status: AC
  Administered 2017-07-09: 10 mg via INTRAVENOUS
  Filled 2017-07-09: qty 4

## 2017-07-09 MED ORDER — DEXTROSE 5 % IV SOLN
Freq: Once | INTRAVENOUS | Status: DC
  Filled 2017-07-09: qty 100

## 2017-07-09 MED ORDER — ASPIRIN 81 MG PO CHEW
324.0000 mg | CHEWABLE_TABLET | Freq: Once | ORAL | Status: AC
  Administered 2017-07-09: 324 mg via ORAL
  Filled 2017-07-09: qty 4

## 2017-07-09 MED ORDER — NICARDIPINE HCL IN NACL 20-0.86 MG/200ML-% IV SOLN
3.0000 mg/h | INTRAVENOUS | Status: DC
  Administered 2017-07-09: 13 mg/h via INTRAVENOUS
  Administered 2017-07-09: 8 mg/h via INTRAVENOUS
  Administered 2017-07-09: 3 mg/h via INTRAVENOUS
  Administered 2017-07-09: 15 mg/h via INTRAVENOUS
  Administered 2017-07-09: 13 mg/h via INTRAVENOUS
  Administered 2017-07-09: 11.5 mg/h via INTRAVENOUS
  Administered 2017-07-09: 10 mg/h via INTRAVENOUS
  Administered 2017-07-10: 4 mg/h via INTRAVENOUS
  Filled 2017-07-09 (×11): qty 200

## 2017-07-09 MED ORDER — ONDANSETRON HCL 4 MG/2ML IJ SOLN: 4.0000 mg | Freq: Four times a day (QID) | INTRAMUSCULAR | Status: DC | PRN

## 2017-07-09 MED ORDER — AMLODIPINE BESYLATE 10 MG PO TABS
10.0000 mg | ORAL_TABLET | Freq: Every evening | ORAL | Status: DC
  Administered 2017-07-09: 10 mg via ORAL
  Filled 2017-07-09: qty 1

## 2017-07-09 MED ORDER — ACETAMINOPHEN 325 MG PO TABS
650.0000 mg | ORAL_TABLET | Freq: Four times a day (QID) | ORAL | Status: DC | PRN
  Filled 2017-07-09: qty 2

## 2017-07-09 MED ORDER — ONDANSETRON HCL 4 MG PO TABS: 4.0000 mg | ORAL_TABLET | Freq: Four times a day (QID) | ORAL | Status: DC | PRN

## 2017-07-09 MED ORDER — HEPARIN (PORCINE) IN NACL 100-0.45 UNIT/ML-% IJ SOLN
1300.0000 [IU]/h | INTRAMUSCULAR | Status: DC
  Administered 2017-07-09: 1300 [IU]/h via INTRAVENOUS
  Filled 2017-07-09: qty 250

## 2017-07-09 MED ORDER — NITROGLYCERIN 0.4 MG SL SUBL
0.4000 mg | SUBLINGUAL_TABLET | SUBLINGUAL | Status: DC | PRN
  Administered 2017-07-09 (×2): 0.4 mg via SUBLINGUAL
  Filled 2017-07-09 (×2): qty 1

## 2017-07-09 MED ORDER — NITROGLYCERIN 2 % TD OINT
1.0000 [in_us] | TOPICAL_OINTMENT | Freq: Once | TRANSDERMAL | Status: AC
  Administered 2017-07-09: 1 [in_us] via TOPICAL
  Filled 2017-07-09: qty 1

## 2017-07-09 MED ORDER — ETHACRYNATE SODIUM 50 MG IV SOLR
100.0000 mg | Freq: Once | INTRAVENOUS | Status: DC
  Filled 2017-07-09: qty 100

## 2017-07-09 MED ORDER — CALCIUM GLUCONATE 10 % IV SOLN
1.0000 g | Freq: Once | INTRAVENOUS | Status: AC
  Administered 2017-07-09: 1 g via INTRAVENOUS
  Filled 2017-07-09: qty 10

## 2017-07-09 MED ORDER — HYDROCODONE-ACETAMINOPHEN 5-325 MG PO TABS
1.0000 | ORAL_TABLET | ORAL | Status: DC | PRN
  Administered 2017-07-09: 2 via ORAL
  Filled 2017-07-09: qty 2

## 2017-07-09 MED ORDER — ONDANSETRON HCL 4 MG/2ML IJ SOLN
4.0000 mg | Freq: Once | INTRAMUSCULAR | Status: AC
  Administered 2017-07-09: 4 mg via INTRAVENOUS
  Filled 2017-07-09: qty 2

## 2017-07-09 MED ORDER — CARVEDILOL 25 MG PO TABS
25.0000 mg | ORAL_TABLET | Freq: Two times a day (BID) | ORAL | Status: DC
  Administered 2017-07-10: 25 mg via ORAL
  Filled 2017-07-09: qty 1

## 2017-07-09 MED ORDER — DOCUSATE SODIUM 100 MG PO CAPS
100.0000 mg | ORAL_CAPSULE | Freq: Two times a day (BID) | ORAL | Status: DC
  Administered 2017-07-09 – 2017-07-10 (×2): 100 mg via ORAL
  Filled 2017-07-09 (×2): qty 1

## 2017-07-09 NOTE — ED Notes (Signed)
Pt states that his chest pain is gone at this time.

## 2017-07-09 NOTE — Progress Notes (Signed)
PRE HD assessment 

## 2017-07-09 NOTE — ED Notes (Addendum)
Chest pain X 4 days.  Patient states he receives dialysis.

## 2017-07-09 NOTE — Progress Notes (Signed)
[facility], [place] 07/09/17  Subjective:   Patient is known to our practice from previous admissions. He states that he had his last treatment on Monday but completed only 1 hour because he started to have chest pain. Now he presents to the emergency room for complaints of chest pain for 4 days with associated with shortness of breath, nausea, vomiting and back pain. He states he was not able to keep blood pressure pills down. His blood pressure is noted to be critically elevated in the emergency room, peaking at 214/155 Patient is currently on nasal cannula. He is able to talk in complete sentences. He states his estimated dry weight is 116 kg but he may have lost some weight recently. He reports swelling in his legs.  Objective:  Vital signs in last 24 hours:  Temp:  [98.5 F (36.9 C)] 98.5 F (36.9 C) (08/22 1150) Pulse Rate:  [31-113] 100 (08/22 1215) Resp:  [17-44] 36 (08/22 1215) BP: (195-226)/(124-178) 195/124 (08/22 1215) SpO2:  [87 %-98 %] 97 % (08/22 1215) FiO2 (%):  [50 %] 50 % (08/22 1159) Weight:  [116 kg (255 lb 11.7 oz)] 116 kg (255 lb 11.7 oz) (08/22 0857)  Weight change:  Filed Weights   07/09/17 0857  Weight: 116 kg (255 lb 11.7 oz)    Intake/Output:   No intake or output data in the 24 hours ending 07/09/17 1226   Physical Exam: General: No acute distress, laying in the bed   HEENT Anicteric, moist oral mucous membranes   Neck Supple, neck veins are not distended   Pulm/lungs Mild basilar crackles, no wheezing   CVS/Heart Irregular, tachycardic   Abdomen:  Soft, nontender   Extremities: 1-2+ pitting edema   Neurologic: Alert, oriented   Skin: No acute rashes   Access: Left forearm AV fistula        Basic Metabolic Panel:   Recent Labs Lab 07/09/17 0922  NA 135  K 3.5  CL 98*  CO2 28  GLUCOSE 114*  BUN 33*  CREATININE 5.37*  CALCIUM 8.9     CBC:  Recent Labs Lab 07/09/17 0922  WBC 9.2  HGB 11.5*  HCT  34.9*  MCV 82.8  PLT 155     Lab Results  Component Value Date   HEPBSAG Negative 04/02/2017   HEPBSAB Reactive 04/02/2017   HEPBIGM Negative 07/10/2016      Microbiology:  No results found for this or any previous visit (from the past 240 hour(s)).  Coagulation Studies: No results for input(s): LABPROT, INR in the last 72 hours.  Urinalysis: No results for input(s): COLORURINE, LABSPEC, PHURINE, GLUCOSEU, HGBUR, BILIRUBINUR, KETONESUR, PROTEINUR, UROBILINOGEN, NITRITE, LEUKOCYTESUR in the last 72 hours.  Invalid input(s): APPERANCEUR    Imaging: Dg Chest 2 View  Result Date: 07/09/2017 CLINICAL DATA:  31 year old male with chest pain and shortness of breath beginning 4 days ago. Vomiting. EXAM: CHEST  2 VIEW COMPARISON:  04/01/2017 and earlier. FINDINGS: Moderate to severe cardiomegaly, and cardiac silhouette appears increased compared to August 2017, and also the chest radiographs from May. Other mediastinal contours remain stable. Visualized tracheal air column is within normal limits. Diffuse increased pulmonary interstitial opacity with parahilar and infrahilar predominance. Only trace pleural fluid evident in the fissures. No pneumothorax. No acute osseous abnormality identified. Negative visible bowel gas pattern. IMPRESSION: 1. Increased interstitial opacity favored to be Acute Pulmonary Edema, viral/atypical respiratory infection less likely. 2. Chronic but increased cardiac silhouette suspicious for Pericardial Effusion. Electronically Signed  By: Genevie Ann M.D.   On: 07/09/2017 09:50     Medications:   . heparin 1,300 Units/hr (07/09/17 1159)  . niCARDipine 13 mg/hr (07/09/17 1226)   . aspirin EC  81 mg Oral Daily  . docusate sodium  100 mg Oral BID   acetaminophen **OR** acetaminophen, bisacodyl, HYDROcodone-acetaminophen, nitroGLYCERIN, ondansetron **OR** ondansetron (ZOFRAN) IV  Assessment/ Plan:  31 y.o. African-American male with hypertension, childhood  seizures, TIA, ESRD on hemodialysis since October 2017, presents with hypertensive emergency, acute shortness of breath  1. End-stage renal disease on hemodialysis MWF. [name] nephrology. [place] 2. Hypertensive emergency with elevated troponin/non-STEMI 3. Acute pulmonary edema 4. Anemia of chronic kidney disease 5. Secondary hyperparathyroidism  Plan: - Patient will be set up for emergency hemodialysis today - Fluid removal with hemodialysis as tolerated. Goal 2-2.5 kg . - Hold ESA due to accelerated hypertension - Monitor phosphorus during this hospitalization - Currently getting nitroglycerin and IV nicardipine for blood pressure management in ICU - We'll continue to follow   LOS: 0 [facility] 8/22/201812:26 PM  [place], [place]

## 2017-07-09 NOTE — ED Triage Notes (Signed)
Pt reports left side chest pain for four days. Pt reports associated SOB, nausea, vomiting and back pain. Pt reports does dialysis MWF and was unable complete on Monday due to the pain. Pt reports has been unable to keep BP medications down for two days.

## 2017-07-09 NOTE — Consult Note (Signed)
[facility]  CARDIOLOGY CONSULT NOTE  Patient ID: Alan Gross MRN: [id number] DOB/AGE: May 30, 1986 31 y.o.  Admit date: 07/09/2017 Referring Physician Dr. Vianne Bulls Primary Physician   Primary Cardiologist [facility] Reason for Consultation hypertensive crisis.  HPI: Pt is a 31 yo male with history of esrd on m-w-f hd, history of hypertension who was admitted after missing all of his meds for 4-6 days. He states he has been compliant with his hd visits however. In the er he was markedly hypertensive with probable mild pulmonary edema. He has a slight troponin elevation to 0.10. He has normal coronary arteries by cath in 2016. Urine tox screen was positive for cannabinoid. He was hypokalemic on admission. His blood pressure has improved with aggressive treatemnt and resuming his hoome meds. He complained of chest pian on presentation but is pain free now.   Review of Systems  HENT: Negative.   Eyes: Negative.   Respiratory: Positive for shortness of breath.   Cardiovascular: Positive for chest pain and leg swelling.  Gastrointestinal: Negative.   Genitourinary: Negative.   Musculoskeletal: Negative.   Skin: Negative.   Neurological: Positive for weakness.  Endo/Heme/Allergies: Negative.   Psychiatric/Behavioral: Negative.     Past Medical History:  Diagnosis Date  . Anxiety   . Depression   . Dyspnea   . H/O cardiac catheterization   . History of febrile seizure   . Hypertension   . Irregular heart beats   . Kidney failure    M,W,F [facility] in [place]; 15 %   . Stroke (cerebrum) ([facility])    no residual effects    Family History  Problem Relation Age of Onset  . Hypertension Other   . Cancer Father        liver  . Hypertension Brother   . Diabetes Paternal Aunt   . Diabetes Paternal Uncle     Social History   Social History  . Marital status: Married    Spouse name: N/A  . Number of children: N/A  . Years of  education: N/A   Occupational History  . 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 Smoking status: Current Some Day Smoker    Packs/day: 0.25    Years: 10.00    Types: Cigarettes    Last attempt to quit: 06/22/2016  . Smokeless tobacco: Never Used     Comment: smokes 1-2 cigarettes daily  . Alcohol use No  . Drug use: Yes    Types: Marijuana  . Sexual activity: Not on file   Other Topics Concern  . Not on file   Social History Narrative  . No narrative on file    Past Surgical History:  Procedure Laterality Date  . A/V FISTULAGRAM N/A 05/07/2017   Procedure: A/V Fistulagram;  Surgeon: Algernon Huxley, MD;  Location: [facility];  Service: Cardiovascular;  Laterality: N/A;  . AV FISTULA PLACEMENT Left 09/10/2016   Procedure: LEFT ARTERIOVENOUS (AV) FISTULA CREATION;  Surgeon: Conrad [place], MD;  Location: [facility];  Service: Vascular;  Laterality: Left;  . CARDIAC CATHETERIZATION Left 05/02/2015   Procedure: Left Heart Cath and Coronary Angiography;  Surgeon: Dionisio David, MD;  Location: [facility];  Service: Cardiovascular;  Laterality: Left;  . CORONARY ANGIOPLASTY    . PERIPHERAL VASCULAR CATHETERIZATION N/A 07/11/2016   Procedure: Dialysis/Perma Catheter Insertion;  Surgeon: Algernon Huxley, MD;  Location: [facility];  Service: Cardiovascular;  Laterality: N/A;  . PERIPHERAL VASCULAR THROMBECTOMY Left 05/07/2017   Procedure: Peripheral Vascular Thrombectomy;  Surgeon: Algernon Huxley, MD;  Location: [facility];  Service: Cardiovascular;  Laterality: Left;     Prescriptions Prior to Admission  Medication Sig Dispense Refill Last Dose  . amLODipine (NORVASC) 10 MG tablet Take 10 mg by mouth every evening.    Past Week at Unknown time  . carvedilol (COREG) 25 MG tablet Take 1 tablet (25 mg total) by mouth 2 (two) times daily with a meal. 60 tablet 0 Past Week at Unknown time  . losartan (COZAAR) 100 MG tablet Take 1 tablet (100 mg  total) by mouth every evening. 30 tablet 0 Past Week at Unknown time  . aspirin 81 MG tablet Take 1 tablet (81 mg total) by mouth daily. (Patient not taking: Reported on 05/07/2017) 30 tablet  Not Taking at Unknown time  . hydrALAZINE (APRESOLINE) 50 MG tablet Take 1 tablet (50 mg total) by mouth every 8 (eight) hours. (Patient not taking: Reported on 05/07/2017) 90 tablet 0 Not Taking at Unknown time  . oxyCODONE-acetaminophen (ROXICET) 5-325 MG tablet Take 1 tablet by mouth every 6 (six) hours as needed for severe pain. (Patient not taking: Reported on 05/07/2017) 8 tablet 0 Not Taking at Unknown time    Physical Exam: Blood pressure (!) 147/93, pulse (!) 109, temperature 98.5 F (36.9 C), temperature source Oral, resp. rate (!) 23, height 6\' 1"  (1.854 m), weight 122.2 kg (269 lb 6.4 oz), SpO2 98 %.   Wt Readings from Last 1 Encounters:  07/09/17 122.2 kg (269 lb 6.4 oz)     General appearance: cooperative Resp: rhonchi bilaterally Chest wall: no tenderness Cardio: tachycardia GI: soft, non-tender; bowel sounds normal; no masses,  no organomegaly Extremities: edema 2+ Neurologic: Grossly normal  Labs:   Lab Results  Component Value Date   WBC 9.2 07/09/2017   HGB 11.5 (L) 07/09/2017   HCT 34.9 (L) 07/09/2017   MCV 82.8 07/09/2017   PLT 155 07/09/2017    Recent Labs Lab 07/09/17 0922  NA 135  K 3.5  CL 98*  CO2 28  BUN 33*  CREATININE 5.37*  CALCIUM 8.9  PROT 6.6  BILITOT 0.6  ALKPHOS 62  ALT 16*  AST 22  GLUCOSE 114*   Lab Results  Component Value Date   CKTOTAL 451 (H) 09/14/2012   CKMB 4.8 (H) 09/14/2012   TROPONINI 0.09 ([place]) 07/09/2017      Radiology: cardiomegaly with pulmonary edema  EKG: sinus tachycaria   ASSESSMENT AND PLAN:  31 yo with esrd admittd with hypertensive crisis due to missing all of his meds for 4-5 days. He was tahycardic and hypoxic with hypokalemia. He has ruled out for an mi with a mild troponin elevation. Symptoms are likely  secondary to failing to take his meds. Would continue to replace his potassium and follow his blood pressure. [place] regardiing compliance with meds and avoiding sodium. Conitnue hd. Will follow with you.   Signed: Teodoro Spray MD, [facility] 07/09/2017, 6:21 PM

## 2017-07-09 NOTE — Progress Notes (Signed)
PRE HD   

## 2017-07-09 NOTE — Progress Notes (Signed)
HD TX started  

## 2017-07-09 NOTE — H&P (Signed)
[name] [place] NAME: Alan Gross    MR#:  [id number]  DATE OF BIRTH:  08-11-1986  DATE OF ADMISSION:  07/09/2017  PRIMARY CARE PHYSICIAN: [name], DO   REQUESTING/REFERRING PHYSICIAN: [name]  CHIEF COMPLAINT: chest  Pain and shortness of breath    Chief Complaint  Patient presents with  . Chest Pain    HISTORY OF PRESENT ILLNESS:  Alan Gross  is a 31 y.o. male with a known history of Essential hypertension, ESRD on hemodialysis Monday, Wednesday, Friday, at came in because of severe nausea, shortness of breath and left-sided chest pain. Patient has been having chest pain on the left side going to the left arm and got worse since yesterday associated shortness of breath. Osteoporosis was on Monday but tablet got terminated and hourly because of his symptoms of chest pain and shortness of breath. Patient says that whenever his blood pressure is high we feels chest pain. Patient also has nausea for the past 4 days and vomiting also. That did not take any BP medications because of nausea and vomiting for the past 4 days. And today shortness of breath got so worse that the patient could not even walk a few steps without getting short of breath. Found to have severe hypoxia, right now he is on 3 L of oxygen and saturation is 92%. He was hypoxic with saturation 87% on room air. Patient also has severely elevated blood pressure and blood. He is to undergo 20/166. Patient received a total of 10 mg IV 1 time. Started on heparin drip by ER physician because of his pain, slightly elevated troponins of 0.10. He has also admitted in May patient was admitted in May for similar complaints. Chest x-ray showed CHF.  PAST MEDICAL HISTORY:   Past Medical History:  Diagnosis Date  . Anxiety   . Depression   . Dyspnea   . H/O cardiac catheterization   . History of febrile seizure   . Hypertension   . Irregular heart beats   .  Kidney failure    M,W,F [facility] in [place]; 15 %   . Stroke (cerebrum) (HCC)    no residual effects    PAST SURGICAL HISTOIRY:   Past Surgical History:  Procedure Laterality Date  . A/V FISTULAGRAM N/A 05/07/2017   Procedure: A/V Fistulagram;  Surgeon: Algernon Huxley, MD;  Location: [facility];  Service: Cardiovascular;  Laterality: N/A;  . AV FISTULA PLACEMENT Left 09/10/2016   Procedure: LEFT ARTERIOVENOUS (AV) FISTULA CREATION;  Surgeon: Conrad Medon, MD;  Location: [facility];  Service: Vascular;  Laterality: Left;  . CARDIAC CATHETERIZATION Left 05/02/2015   Procedure: Left Heart Cath and Coronary Angiography;  Surgeon: Dionisio David, MD;  Location: [facility];  Service: Cardiovascular;  Laterality: Left;  . CORONARY ANGIOPLASTY    . PERIPHERAL VASCULAR CATHETERIZATION N/A 07/11/2016   Procedure: Dialysis/Perma Catheter Insertion;  Surgeon: Algernon Huxley, MD;  Location: [facility];  Service: Cardiovascular;  Laterality: N/A;  . PERIPHERAL VASCULAR THROMBECTOMY Left 05/07/2017   Procedure: Peripheral Vascular Thrombectomy;  Surgeon: Algernon Huxley, MD;  Location: [facility];  Service: Cardiovascular;  Laterality: Left;    SOCIAL HISTORY:   Social History  Substance Use Topics  . Smoking status: Current Some Day Smoker    Packs/day: 0.25    Years: 10.00    Types: Cigarettes    Last attempt to quit: 06/22/2016  .  Smokeless tobacco: Never Used     Comment: smokes 1-2 cigarettes daily  . Alcohol use No    FAMILY HISTORY:   Family History  Problem Relation Age of Onset  . Hypertension Other   . Cancer Father        liver  . Hypertension Brother   . Diabetes Paternal Aunt   . Diabetes Paternal Uncle     DRUG ALLERGIES:   Allergies  Allergen Reactions  . Lasix [Furosemide] Shortness Of Breath, Swelling and Anxiety    REVIEW OF SYSTEMS:  CONSTITUTIONAL: No fever, fatigue or weakness.  EYES: No blurred or double vision.  EARS, NOSE, AND  THROAT: No tinnitus or ear pain.  RESPIRATORY: cough,SOB,chest pain. CARDIOVASCULAR: No chest pain, orthopnea, edema.  GASTROINTESTINAL:nausea,vomiting for last 4 days Not able to keep anything down.  GENITOURINARY: No dysuria, hematuria.  ENDOCRINE: No polyuria, nocturia,  HEMATOLOGY: No anemia, easy bruising or bleeding SKIN: No rash or lesion. MUSCULOSKELETAL: No joint pain or arthritis.   NEUROLOGIC: No tingling, numbness, weakness.  PSYCHIATRY: No anxiety or depression.   MEDICATIONS AT HOME:   Prior to Admission medications   Medication Sig Start Date End Date Taking? Authorizing Provider  amLODipine (NORVASC) 10 MG tablet Take 10 mg by mouth every evening.    Yes [provider]  carvedilol (COREG) 25 MG tablet Take 1 tablet (25 mg total) by mouth 2 (two) times daily with a meal. 04/03/17  Yes Mody, Sital, MD  losartan (COZAAR) 100 MG tablet Take 1 tablet (100 mg total) by mouth every evening. 04/03/17  Yes Bettey Costa, MD  aspirin 81 MG tablet Take 1 tablet (81 mg total) by mouth daily. Patient not taking: Reported on 05/07/2017 07/19/16   [name], DO  hydrALAZINE (APRESOLINE) 50 MG tablet Take 1 tablet (50 mg total) by mouth every 8 (eight) hours. Patient not taking: Reported on 05/07/2017 04/03/17   Bettey Costa, MD  oxyCODONE-acetaminophen (ROXICET) 5-325 MG tablet Take 1 tablet by mouth every 6 (six) hours as needed for severe pain. Patient not taking: Reported on 05/07/2017 09/10/16   Gabriel Earing, PA-C      VITAL SIGNS:  Blood pressure (!) 220/164, pulse 95, temperature 98.5 F (36.9 C), temperature source Oral, resp. rate (!) 24, weight 116 kg (255 lb 11.7 oz), SpO2 94 %.  PHYSICAL EXAMINATION:  GENERAL:  31 y.o.-year-old patient lying in the bed with no acute distress.  EYES: Pupils equal, round, reactive to light and accommodation. No scleral icterus. Extraocular muscles intact.  HEENT: Head atraumatic, normocephalic. Oropharynx and  nasopharynx clear.  NECK:  Supple, no jugular venous distention. No thyroid enlargement, no tenderness.  LUNGS: has bilateral basilar crepitations present no wheezing.  CARDIOVASCULAR: S1, S2 normal. No murmurs, rubs, or gallops.  ABDOMEN: Soft, nontender, nondistended. Bowel sounds present. No organomegaly or mass.  EXTREMITIES: The bilateral extremity edema . NEUROLOGIC: Cranial nerves II through XII are intact. Muscle strength 5/5 in all extremities. Sensation intact. Gait not checked.  PSYCHIATRIC: The patient is alert and oriented x 3.  SKIN: No obvious rash, lesion, or ulcer.   LABORATORY PANEL:   CBC  Recent Labs Lab 07/09/17 0922  WBC 9.2  HGB 11.5*  HCT 34.9*  PLT 155   ------------------------------------------------------------------------------------------------------------------  Chemistries   Recent Labs Lab 07/09/17 0922  NA 135  K 3.5  CL 98*  CO2 28  GLUCOSE 114*  BUN 33*  CREATININE 5.37*  CALCIUM 8.9  AST 22  ALT 16*  ALKPHOS  62  BILITOT 0.6   ------------------------------------------------------------------------------------------------------------------  Cardiac Enzymes  Recent Labs Lab 07/09/17 0922  TROPONINI 0.10*   ------------------------------------------------------------------------------------------------------------------  RADIOLOGY:  Dg Chest 2 View  Result Date: 07/09/2017 CLINICAL DATA:  31 year old male with chest pain and shortness of breath beginning 4 days ago. Vomiting. EXAM: CHEST  2 VIEW COMPARISON:  04/01/2017 and earlier. FINDINGS: Moderate to severe cardiomegaly, and cardiac silhouette appears increased compared to August 2017, and also the chest radiographs from May. Other mediastinal contours remain stable. Visualized tracheal air column is within normal limits. Diffuse increased pulmonary interstitial opacity with parahilar and infrahilar predominance. Only trace pleural fluid evident in the fissures. No  pneumothorax. No acute osseous abnormality identified. Negative visible bowel gas pattern. IMPRESSION: 1. Increased interstitial opacity favored to be Acute Pulmonary Edema, viral/atypical respiratory infection less likely. 2. Chronic but increased cardiac silhouette suspicious for Pericardial Effusion. Electronically Signed   By: Genevie Ann M.D.   On: 07/09/2017 09:50    EKG:   Orders placed or performed during the hospital encounter of 07/09/17  . EKG 12-Lead  . EKG 12-Lead  . ED EKG within 10 minutes  . ED EKG within 10 minutes  . ED EKG  . ED EKG  EKG shows sinus I cardiovascular PACs 1 12 bpm/ST-T changes.  IMPRESSION AND PLAN:  #1. Hypertensive urgency with severely elevated blood pressure 220/126. Admitted to intensive care unit, started on Cardene drip. Has been admitted to [facility] and also this hospital for the similar problems.  Each ear motor and blood pressure medications including amlodipine 10 mg, Coreg 25 mg, losartan 100 mg once his nausea 2 ESRD: Incomplete hemodialysis on Monday. Now with the evidence of hypertensive urgency, acute respiratory failure with hypoxia and fluid overload needs emergency hemodialysis. I spoke with Dr. Murlean Iba. #3 .elevated troponins with chest pain likely secondary to demand ischemia from shortness of breath, hypertensive urgency. 0.10. Admitted to intensive care unit, follow the trend. Patient had a CAT scan 2 years ago and had normal coronaries. Patient has history of elevated troponins in the contest of shortness of breath, hypertensive urgency. Obtain cardiology consult. on heparin drip, For nausea, vomiting: Continue IV Zofran, IV PPIs.  All the records are reviewed and case discussed with ED provider. Management plans discussed with the patient, family and they are in agreement.  CODE STATUS: full  TOTAL TIME TAKING CARE OF THIS PATIENT: 55 minutes. (Critical care)   Kaleia Longhi M.D on 07/09/2017 at  11:09 AM  Between 7am to 6pm - Pager - [phone]  After 6pm go to [email or web address] - password [email or web address] [facility]  Office  [phone]  CC: Primary care physician; [name], DO  Note: This dictation was prepared with Dragon dictation along with smaller phrase technology. Any transcriptional errors that result from this process are unintentional.

## 2017-07-09 NOTE — Progress Notes (Signed)
Pt refuses Bipap at this time.

## 2017-07-09 NOTE — Consult Note (Signed)
Name: Alan Gross MRN: [id number] DOB: Mar 29, 1986    ADMISSION DATE:  07/09/2017 CONSULTATION DATE: 07/09/2017  REFERRING MD :  Dr. Vianne Bulls   CHIEF COMPLAINT: Shortness of Breath and Chest Pain  BRIEF PATIENT DESCRIPTION:  31 year old male admitted 08/22 with acute hypoxic respiratory failure secondary to pulmonary edema, hypertensive emergency, and chest pain with elevated troponin's requiring Bipap and Nicardipine gtt  SIGNIFICANT EVENTS  08/22-Pt admitted to the Stepdown Unit   STUDIES:  Echo 08/22>>  HISTORY OF PRESENT ILLNESS:   This is a 31 year old male with a past medical history of stroke no residual deficits, CKD on hemodialysis M-W-F, hypertension, febrile seizures, dyspnea, depression, and anxiety.  He presented to the ER 08/22 with c/o chest pain, shortness of breath, nausea, vomiting, and back pain onset of symptoms 4 days prior to presentation.  Due to symptoms he has been unable to keep his blood pressures medications down for the past 4 days, in the ER blood pressure 214/155. His last dialysis treatment was on 08/20, however he was only able to complete 1 hour of the session due to chest pain and shortness of breath.  In the ER he was found to be hypoxic with O2 sats 87% on room air, therefore he was placed on 3L O2. CXR revealed pulmonary edema and troponin's were slightly elevated at 0.10, he was placed on a heparin gtt and given 10 mg iv labetalol x 1dose.  He was subsequently admitted to the stepdown unit by hospitalist team for further workup and treatment PCCM consulted, upon arrival patient placed on continuous BiPAP due to worsening respiratory failure.   PAST MEDICAL HISTORY :   has a past medical history of Anxiety; Depression; Dyspnea; H/O cardiac catheterization; History of febrile seizure; Hypertension; Irregular heart beats; Kidney failure; and Stroke (cerebrum) ([place]).  has a past surgical history that includes Coronary angioplasty; Cardiac  catheterization (Left, 05/02/2015); Cardiac catheterization (N/A, 07/11/2016); AV fistula placement (Left, 09/10/2016); PERIPHERAL VASCULAR THROMBECTOMY (Left, 05/07/2017); and A/V Fistulagram (N/A, 05/07/2017). Prior to Admission medications   Medication Sig Start Date End Date Taking? Authorizing Provider  amLODipine (NORVASC) 10 MG tablet Take 10 mg by mouth every evening.    Yes [provider]  carvedilol (COREG) 25 MG tablet Take 1 tablet (25 mg total) by mouth 2 (two) times daily with a meal. 04/03/17  Yes Mody, Sital, MD  losartan (COZAAR) 100 MG tablet Take 1 tablet (100 mg total) by mouth every evening. 04/03/17  Yes Bettey Costa, MD  aspirin 81 MG tablet Take 1 tablet (81 mg total) by mouth daily. Patient not taking: Reported on 05/07/2017 07/19/16   [name], DO  hydrALAZINE (APRESOLINE) 50 MG tablet Take 1 tablet (50 mg total) by mouth every 8 (eight) hours. Patient not taking: Reported on 05/07/2017 04/03/17   Bettey Costa, MD  oxyCODONE-acetaminophen (ROXICET) 5-325 MG tablet Take 1 tablet by mouth every 6 (six) hours as needed for severe pain. Patient not taking: Reported on 05/07/2017 09/10/16   Gabriel Earing, PA-C   Allergies  Allergen Reactions  . Lasix [Furosemide] Shortness Of Breath, Swelling and Anxiety    FAMILY HISTORY:  family history includes Cancer in his father; Diabetes in his paternal aunt and paternal uncle; Hypertension in his brother and other. SOCIAL HISTORY:  reports that he has been smoking Cigarettes.  He has a 2.50 pack-year smoking history. He has never used smokeless tobacco. He reports that he uses drugs, including Marijuana. He reports that he does not  drink alcohol.  REVIEW OF SYSTEMS: Positives in BOLD  Constitutional: Negative for fever, chills, weight loss, malaise/fatigue and diaphoresis.  HENT: Negative for hearing loss, ear pain, nosebleeds, congestion, sore throat, neck pain, tinnitus and ear discharge.   Eyes: Negative for  blurred vision, double vision, photophobia, pain, discharge and redness.  Respiratory: cough, hemoptysis, sputum production, shortness of breath, wheezing and stridor.   Cardiovascular: chest pain, palpitations, orthopnea, claudication, leg swelling and PND.  Gastrointestinal: heartburn, nausea, vomiting, abdominal pain, diarrhea, constipation, blood in stool and melena.  Genitourinary: Negative for dysuria, urgency, frequency, hematuria and flank pain.  Musculoskeletal: myalgias, back pain, joint pain and falls.  Skin: Negative for itching and rash.  Neurological: Negative for dizziness, tingling, tremors, sensory change, speech change, focal weakness, seizures, loss of consciousness, weakness and headaches.  Endo/Heme/Allergies: Negative for environmental allergies and polydipsia. Does not bruise/bleed easily.  SUBJECTIVE:  Patient currently on BiPAP states shortness of breath has decreased.   VITAL SIGNS: Temp:  [98.5 F (36.9 C)] 98.5 F (36.9 C) (08/22 1150) Pulse Rate:  [31-113] 97 (08/22 1230) Resp:  [17-44] 24 (08/22 1230) BP: (179-226)/(112-178) 179/114 (08/22 1230) SpO2:  [87 %-98 %] 93 % (08/22 1230) FiO2 (%):  [50 %] 50 % (08/22 1159) Weight:  [116 kg (255 lb 11.7 oz)-122.2 kg (269 lb 6.4 oz)] 122.2 kg (269 lb 6.4 oz) (08/22 1155)  PHYSICAL EXAMINATION: General: Well-developed, well-nourished, AA male, NAD Neuro: Alert and oriented, follows commands HEENT: Supple, no JVD Cardiovascular: sinus tach, s1s2, no M/R/G Lungs: Faint crackles bilateral bases, even, nonlabored; no wheezes, rales, or rhonchi, on BiPAP Abdomen: Positive bowel sounds 4, obese, soft, nontender, nondistended Musculoskeletal: Normal tone, 1+ bilateral lower extremity edema Skin: Intact, no rashes or lesions, left wrist AV fistula positive bruit and thrill   Recent Labs Lab 07/09/17 0922  NA 135  K 3.5  CL 98*  CO2 28  BUN 33*  CREATININE 5.37*  GLUCOSE 114*    Recent Labs Lab  07/09/17 0922  HGB 11.5*  HCT 34.9*  WBC 9.2  PLT 155   Dg Chest 2 View  Result Date: 07/09/2017 CLINICAL DATA:  31 year old male with chest pain and shortness of breath beginning 4 days ago. Vomiting. EXAM: CHEST  2 VIEW COMPARISON:  04/01/2017 and earlier. FINDINGS: Moderate to severe cardiomegaly, and cardiac silhouette appears increased compared to August 2017, and also the chest radiographs from May. Other mediastinal contours remain stable. Visualized tracheal air column is within normal limits. Diffuse increased pulmonary interstitial opacity with parahilar and infrahilar predominance. Only trace pleural fluid evident in the fissures. No pneumothorax. No acute osseous abnormality identified. Negative visible bowel gas pattern. IMPRESSION: 1. Increased interstitial opacity favored to be Acute Pulmonary Edema, viral/atypical respiratory infection less likely. 2. Chronic but increased cardiac silhouette suspicious for Pericardial Effusion. Electronically Signed   By: Genevie Ann M.D.   On: 07/09/2017 09:50    ASSESSMENT / PLAN: Acute hypoxic respiratory failure secondary to pulmonary edema Elevated troponin's secondary to demand ischemia vs. NSTEMI Hypertensive emergency CKD Hx: Stroke P: Continue BiPAP for now wean as tolerated Maintain O2 sats greater than 92% Repeat chest x-ray in the a.m Nephrology consulted appreciate input-plans for emergent HD today  Cardiology consulted appreciate input Continue Heparin and Cardene gtt Continue aspirin Trend troponins Continuous telemetry monitoring Echo pending Trend BMP Replace electrolytes as indicated Urine drug screen pending Daily weights Trend CBC Monitor for s/sx of bleeding Prn sublingual nitroglycerin and Norco for pain management  Marda Stalker,  [place] Pager [phone] (please enter 7 digits) PCCM Consult Pager [phone] (please enter 7 digits)  STAFF NOTE: I. Dr. Ashby Dawes, have personally  reviewed the patient's available data including medical history , events of notes, physican examination and test results as part of my evaluation. I have discussed with the  Care with the NP and other care providers including  pharmacist, ICU RN, RRT, dietary.  Physical Exam Lungs - Decreased air entry bilaterally.   The patient is a 31 year old male with a history of stroke, chronic kidney disease, on hemodialysis Monday, Wednesday, Friday, hypertension, febrile seizures, dyspnea, depression, anxiety. The patient presented to the ED with dyspnea, nausea, vomiting for 4 days. He was thus unable to keep his blood pressure medications down, he was not able to complete dialysis due to dyspnea. He was therefore sent to the emergency room. He was found to be hypoxic with dyspnea. I personally reviewed, images, chest x-ray shows cardiomegaly with pulmonary edema. The patient was placed on BiPAP in sent to the emergency room. Currently, his breathing is somewhat better, he still continues to be dyspneic, with acute hypoxic respiratory failure due to acute pulmonary edema. Upon presentation in the ED his initial blood pressure was 225/178, he is now on nicardipine drip with improvement to 151/87. We'll continue with nicardipine drip, wean down as tolerated as he is able to take his oral antihypertensive medications, we'll wean off BiPAP as tolerated. Will monitor in the intensive care unit.  Marda Stalker, MD.   Board Certified in Internal Medicine, Pulmonary Medicine, [place], and Sleep Medicine.  [facility] Office Number: [phone] Pager: [phone]  Patricia Pesa, M.D.  Merton Border, M.D

## 2017-07-09 NOTE — ED Notes (Signed)
Patient transported to X-ray 

## 2017-07-09 NOTE — Progress Notes (Signed)
Seen by Rosary Lively NP. Pt on Max cardene gtt 15mg /hr now.  BP 174/109.  Dr Candiss Norse notified of need for BIPAP, rate of cardene, and current bp. Await further orders. Pt to have dialysis asap. Fistula left arm with good bruit and thrill

## 2017-07-09 NOTE — ED Provider Notes (Addendum)
[facility] Emergency Department Provider Note  ____________________________________________  Time seen: Approximately 9:49 AM  I have reviewed the triage vital signs and the nursing notes.   HISTORY  Chief Complaint Chest Pain    HPI Alan Gross is a 31 y.o. male history of ESRD on HD, HTN, CVA, presenting with nausea and vomiting, hypertension, chest pain and shortness of breath. The patient reports that he regularly experiences "needle like" left-sided chest pain. However, since Saturday, he has reported a sharp pain in the left axilla, which now has become a tightening sensation. The pain is not pleuritic. He has associated shortness of breath but not does not notice that is worse with exertion. He has also been having nausea and vomiting for the past 3 days, and has not been able to keep his blood pressure medications down. 2 days ago, he underwent one hour out of for his dialysis, which had to be stopped due to pain. He came to the emergency department for evaluation but left without being seen. His chest pain and shortness of breath have intermittently persisted since then. On arrival to the emergency department, the patient was tachycardic, significantly hypertensive, and hypoxic to 86% on room air.   Past Medical History:  Diagnosis Date  . Anxiety   . Depression   . Dyspnea   . H/O cardiac catheterization   . History of febrile seizure   . Hypertension   . Irregular heart beats   . Kidney failure    M,W,F [facility] in [place]; 15 %   . 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 Hypertensive urgency 04/01/2017  . ESRD (end stage renal disease) on dialysis ([place]) 07/19/2016  . Anxiety 07/19/2016  . Heart failure, diastolic, due to HTN ([place]) 07/19/2016  . Costochondritis 07/19/2016  . HTN (hypertension) 07/09/2016    Past Surgical History:  Procedure Laterality Date  . A/V  FISTULAGRAM N/A 05/07/2017   Procedure: A/V Fistulagram;  Surgeon: Algernon Huxley, MD;  Location: [facility];  Service: Cardiovascular;  Laterality: N/A;  . AV FISTULA PLACEMENT Left 09/10/2016   Procedure: LEFT ARTERIOVENOUS (AV) FISTULA CREATION;  Surgeon: Conrad Dotyville, MD;  Location: [facility];  Service: Vascular;  Laterality: Left;  . CARDIAC CATHETERIZATION Left 05/02/2015   Procedure: Left Heart Cath and Coronary Angiography;  Surgeon: Dionisio David, MD;  Location: [facility];  Service: Cardiovascular;  Laterality: Left;  . CORONARY ANGIOPLASTY    . PERIPHERAL VASCULAR CATHETERIZATION N/A 07/11/2016   Procedure: Dialysis/Perma Catheter Insertion;  Surgeon: Algernon Huxley, MD;  Location: [facility];  Service: Cardiovascular;  Laterality: N/A;  . PERIPHERAL VASCULAR THROMBECTOMY Left 05/07/2017   Procedure: Peripheral Vascular Thrombectomy;  Surgeon: Algernon Huxley, MD;  Location: [facility];  Service: Cardiovascular;  Laterality: Left;    Current Outpatient Rx  . Order #: [id number] Class: Historical Med  . Order #: [id number] Class: OTC  . Order #: [id number] Class: Print  . Order #: [id number] Class: Print  . Order #: [id number] Class: Print  . Order #: [id number] Class: Print    Allergies Lasix [furosemide]  Family History  Problem Relation Age of Onset  . Hypertension Other   . Cancer Father        liver  . Hypertension Brother   . Diabetes Paternal Aunt   . Diabetes Paternal Uncle     Social History Social History  Substance Use Topics  . Smoking status: Current Some Day Smoker    Packs/day: 0.25    Years: 10.00    Types: Cigarettes    Last attempt to quit: 06/22/2016  . Smokeless tobacco: Never Used     Comment: smokes 1-2 cigarettes daily  . Alcohol use No    Review of Systems Constitutional: No fever/chills.No lightheadedness or syncope. Positive decreased exercise tolerance. Eyes: No visual changes. No blurred or double vision. ENT: No sore  throat. No congestion or rhinorrhea. Cardiovascular: Positive chest tightness under the left axilla that does not radiate. Denies palpitations. Respiratory: Positive shortness of breath.  No cough. Gastrointestinal: No abdominal pain.  Positive nausea, positive vomiting.  No diarrhea.  No constipation. Genitourinary: Negative for dysuria. Musculoskeletal: Negative for back pain. Positive bilateral lower extremity edema. Skin: Negative for rash. Neurological: Negative for headaches. No focal numbness, tingling or weakness.     ____________________________________________   PHYSICAL EXAM:  VITAL SIGNS: ED Triage Vitals  Enc Vitals Group     BP 07/09/17 0917 (!) 225/178     Pulse Rate 07/09/17 0859 (!) 113     Resp 07/09/17 0859 (!) 24     Temp 07/09/17 0859 98.5 F (36.9 C)     Temp Source 07/09/17 0859 Oral     SpO2 07/09/17 0859 95 %     Weight 07/09/17 0857 255 lb 11.7 oz (116 kg)     Height --      Head Circumference --      Peak Flow --      Pain Score 07/09/17 0856 8     Pain Loc --      Pain Edu? --      Excl. in [place]? --     Constitutional: Alert and oriented. Chronically ill appearing and mildly uncomfortable, but nontoxic. Answers questions appropriately. Eyes: Conjunctivae are normal.  EOMI. No scleral icterus. Head: Atraumatic. Nose: No congestion/rhinnorhea. Mouth/Throat: Mucous membranes are moist.  Neck: No stridor.  Supple.  Positive JVD. Cardiovascular: Fast rate, regular rhythm. No murmurs, rubs or gallops.  Respiratory: Normal respiratory effort.  No accessory muscle use or retractions. Lungs CTAB.  No wheezes, rales or ronchi. Gastrointestinal: Soft, nontender and nondistended.  No guarding or rebound.  No peritoneal signs. Musculoskeletal: Bilateral symmetric LE edema. No ttp in the calves or palpable cords.  Negative Homan's sign. Left upper cavity fistula in the distal forearm with palpable thrill. Neurologic:  A&Ox3.  Speech is clear.  Face and smile  are symmetric.  EOMI.  Moves all extremities well. Skin:  Skin is warm, dry and intact. No rash noted. Psychiatric: Mood and affect are normal. Speech and behavior are normal.  Normal judgement.  ____________________________________________   LABS (all labs ordered are listed, but only abnormal results are displayed)  Labs Reviewed  BASIC METABOLIC PANEL - Abnormal; Notable for the following:       Result Value   Chloride 98 (*)    Glucose, Bld 114 (*)    BUN 33 (*)    Creatinine, Ser 5.37 (*)    GFR calc non Af Amer 13 (*)    GFR calc Af Amer 15 (*)    All other components within normal limits  CBC - Abnormal; Notable for the following:    RBC 4.21 (*)    Hemoglobin 11.5 (*)    HCT 34.9 (*)    RDW 16.4 (*)    All other components within normal limits  TROPONIN I - Abnormal; Notable  for the following:    Troponin I 0.10 (*)    All other components within normal limits  URINE DRUG SCREEN, QUALITATIVE ([facility] ONLY)  BRAIN NATRIURETIC PEPTIDE  HEPATIC FUNCTION PANEL   ____________________________________________  EKG  ED ECG REPORT I, Eula Listen, the attending physician, personally viewed and interpreted this ECG.   Date: 07/09/2017  EKG Time: 856  Rate: 112  Rhythm: sinus tachycardia  Axis: leftward  Intervals:none  ST&T Change: 41mm ST elevation in V1, V3 without reciprocal changes.  Twaves are mildly peaked in V4, V5.  No QRS widening.  ____________________________________________  RADIOLOGY  Dg Chest 2 View  Result Date: 07/09/2017 CLINICAL DATA:  31 year old male with chest pain and shortness of breath beginning 4 days ago. Vomiting. EXAM: CHEST  2 VIEW COMPARISON:  04/01/2017 and earlier. FINDINGS: Moderate to severe cardiomegaly, and cardiac silhouette appears increased compared to August 2017, and also the chest radiographs from May. Other mediastinal contours remain stable. Visualized tracheal air column is within normal limits. Diffuse increased  pulmonary interstitial opacity with parahilar and infrahilar predominance. Only trace pleural fluid evident in the fissures. No pneumothorax. No acute osseous abnormality identified. Negative visible bowel gas pattern. IMPRESSION: 1. Increased interstitial opacity favored to be Acute Pulmonary Edema, viral/atypical respiratory infection less likely. 2. Chronic but increased cardiac silhouette suspicious for Pericardial Effusion. Electronically Signed   By: Genevie Ann M.D.   On: 07/09/2017 09:50    ____________________________________________   PROCEDURES  Procedure(s) performed: None  Procedures  Critical Care performed: Yes, see critical care note(s) ____________________________________________   INITIAL IMPRESSION / ASSESSMENT AND PLAN / ED COURSE  Pertinent labs & imaging results that were available during my care of the patient were reviewed by me and considered in my medical decision making (see chart for details).  31 y.o. male with ESRD on HD, CVA, presenting with chest pain, shortness of breath has been going on for 5 days. Overall, I am concerned about the patient's significant hypertension, tachycardia and hypoxia. Some of these symptoms may be related to fluid overload, but we'll evaluate for ACS, congestive heart failure, hypertensive emergency. I do not see any evidence of acute CVA or infectious etiology at this time. I have initiated subungual much glycerin and labetalol for his blood pressure, laboratory studies are pending at this time. Plan admission for further evaluation and treatment.  ----------------------------------------- 10:30 AM on 07/09/2017 -----------------------------------------  After a single submental that she glycerin, the patient's chest pain did go down to 1 out of 10. It continued nitroglycerin therapy due to continued hypertension. At this time, his blood pressure has improved to 210/145 from 225 before 178. He has been given labetalol as well as  nitroglycerin glycerin ointment, but dialysis will provide definitive treatment for this. The patient does have a troponin of 0.10. He has had elevated troponin in the past, but I am concerned that with his chest pain this is an indicator of an and STEMI. He has been treated with aspirin, and heparin has been ordered. His chest x-ray is concerning for an enlarged cardiac silhouette, and I have related this to the hospitalist so that the patient may undergo echocardiogram evaluation. At this time, the patient has an improving clinical course, but still has several significant abnormalities which require admission at this time.  CRITICAL CARE Performed by: Eula Listen   Total critical care time: 50 minutes  Critical care time was exclusive of separately billable procedures and treating other patients.  Critical care was necessary to  treat or prevent imminent or life-threatening deterioration.  Critical care was time spent personally by me on the following activities: development of treatment plan with patient and/or surrogate as well as nursing, discussions with consultants, evaluation of patient's response to treatment, examination of patient, obtaining history from patient or surrogate, ordering and performing treatments and interventions, ordering and review of laboratory studies, ordering and review of radiographic studies, pulse oximetry and re-evaluation of patient's condition.     ____________________________________________  FINAL CLINICAL IMPRESSION(S) / ED DIAGNOSES  Final diagnoses:  NSTEMI (non-ST elevated myocardial infarction) ([place])  Hypoxia  Hypertensive emergency         NEW MEDICATIONS STARTED DURING THIS VISIT:  New Prescriptions   No medications on file      Eula Listen, MD 07/09/17 1000    Eula Listen, MD 07/09/17 1034

## 2017-07-09 NOTE — ED Notes (Signed)
RA O2 88%, patient placed on 2L O2

## 2017-07-09 NOTE — Progress Notes (Signed)
Arrived from ED at 1150 am. With hypertensive crisis and fluid overload. Alert and Oriented. Pain free on arrival. Tachypneic and labored initially. BNP 3509. Wore BIPAP first few hours and now on 4 L/[place]. Cardene gtt was started immediately upon admission. Initial BP 226/167. Currently at 7mcg with bp 147/92. Dialysis is in progress and he tolerates well. Functioning fistula left forearm. Seen by Dr Ubaldo Glassing. Heparin gtt was DC'd. Edecrin was not mixed prior to dialysis and was not given. Edecrin was to be used as a bridge until dialysis available. Pt states he is allergic to lasix.

## 2017-07-09 NOTE — Progress Notes (Signed)
ANTICOAGULATION CONSULT NOTE - Initial Consult  Pharmacy Consult for Heparin Drip Indication: chest pain/ACS  Allergies  Allergen Reactions  . Lasix [Furosemide] Shortness Of Breath, Swelling and Anxiety    Patient Measurements: Weight: 255 lb 11.7 oz (116 kg) Heparin Dosing Weight: 100.7 kg  Vital Signs: Temp: 98.5 F (36.9 C) (08/22 1150) Temp Source: Axillary (08/22 1150) BP: 184/112 (08/22 1224) Pulse Rate: 99 (08/22 1224)  Labs:  Recent Labs  07/09/17 0922  HGB 11.5*  HCT 34.9*  PLT 155  CREATININE 5.37*  TROPONINI 0.10*    Estimated Creatinine Clearance: 26.1 mL/min (A) (by C-G formula based on SCr of 5.37 mg/dL (H)).   Medical History: Past Medical History:  Diagnosis Date  . Anxiety   . Depression   . Dyspnea   . H/O cardiac catheterization   . History of febrile seizure   . Hypertension   . Irregular heart beats   . Kidney failure    M,W,F [facility] in [place]; 15 %   . Stroke (cerebrum) (HCC)    no residual effects    Medications:  Scheduled:  . aspirin EC  81 mg Oral Daily  . docusate sodium  100 mg Oral BID   Infusions:  . heparin 1,300 Units/hr (07/09/17 1159)  . niCARDipine 13 mg/hr (07/09/17 1226)    Assessment: 31 yo male with ESRD on HD presented to ED with chest pain. Heparin started due to hx of elevated troponins w/ SOB and hypertensive urgency.   Goal of Therapy:  Heparin level 0.3-0.7 units/ml Monitor platelets by anticoagulation protocol: Yes   Plan:  Give 4000 units bolus x 1 Start heparin infusion at 1300 units/hr Check anti-Xa level in 8 hours and daily while on heparin Continue to monitor H&H and platelets  HL ordered for tonight at 20:00.   Olivia Canter, RPH 07/09/2017,12:29 PM

## 2017-07-09 NOTE — ED Notes (Signed)
ED Provider at bedside. 

## 2017-07-10 ENCOUNTER — Inpatient Hospital Stay
Admit: 2017-07-10 | Discharge: 2017-07-10 | Disposition: A | Discharge status: Home / Self Care | Payer: 59 | Attending: Critical Care Medicine | Admitting: Critical Care Medicine

## 2017-07-10 ENCOUNTER — Encounter: Payer: Self-pay | Admitting: *Deleted

## 2017-07-10 DIAGNOSIS — J810 Acute pulmonary edema: Secondary | ICD-10-CM

## 2017-07-10 LAB — CBC
HCT: 31.2 % — ABNORMAL LOW (ref 40.0–52.0)
Hemoglobin: 10.5 g/dL — ABNORMAL LOW (ref 13.0–18.0)
MCH: 27.9 pg (ref 26.0–34.0)
MCHC: 33.6 g/dL (ref 32.0–36.0)
MCV: 83 fL (ref 80.0–100.0)
PLATELETS: 153 10*3/uL (ref 150–440)
RBC: 3.76 MIL/uL — AB (ref 4.40–5.90)
RDW: 16.5 % — ABNORMAL HIGH (ref 11.5–14.5)
WBC: 8 10*3/uL (ref 3.8–10.6)

## 2017-07-10 LAB — BASIC METABOLIC PANEL
Anion gap: 9 (ref 5–15)
BUN: 30 mg/dL — ABNORMAL HIGH (ref 6–20)
CHLORIDE: 98 mmol/L — AB (ref 101–111)
CO2: 30 mmol/L (ref 22–32)
CREATININE: 4.85 mg/dL — AB (ref 0.61–1.24)
Calcium: 7.9 mg/dL — ABNORMAL LOW (ref 8.9–10.3)
GFR calc non Af Amer: 14 mL/min — ABNORMAL LOW (ref >60)
GFR, EST AFRICAN AMERICAN: 17 mL/min — AB (ref >60)
GLUCOSE: 90 mg/dL (ref 65–99)
Potassium: 3.2 mmol/L — ABNORMAL LOW (ref 3.5–5.1)
Sodium: 137 mmol/L (ref 135–145)

## 2017-07-10 LAB — TROPONIN I: Troponin I: 0.4 ng/mL (ref <0.03)

## 2017-07-10 LAB — HIV ANTIBODY (ROUTINE TESTING W REFLEX): HIV SCREEN 4TH GENERATION: NONREACTIVE

## 2017-07-10 LAB — ECHOCARDIOGRAM COMPLETE
HEIGHTINCHES: 73 in
WEIGHTICAEL: 4324.54 [oz_av]

## 2017-07-10 LAB — GLUCOSE, CAPILLARY: GLUCOSE-CAPILLARY: 108 mg/dL — AB (ref 65–99)

## 2017-07-10 MED ORDER — CARVEDILOL 25 MG PO TABS: 25.0000 mg | ORAL_TABLET | Freq: Two times a day (BID) | ORAL | 1 refills | Status: DC

## 2017-07-10 MED ORDER — LOSARTAN POTASSIUM 100 MG PO TABS: 100.0000 mg | ORAL_TABLET | Freq: Every evening | ORAL | 1 refills | Status: DC

## 2017-07-10 MED ORDER — AMLODIPINE BESYLATE 10 MG PO TABS: 10.0000 mg | ORAL_TABLET | Freq: Every evening | ORAL | 1 refills | Status: AC

## 2017-07-10 NOTE — Progress Notes (Signed)
[facility], [place] 07/10/17  Subjective:   2.5 L removed with dialysis yesterday About 500 cc urine output with IV ethacrynic acid This morning, blood pressure is much better controlled with home regimen Patient is asking about going home No nausea, vomiting. Ate a good breakfast without any problem.  Objective:  Vital signs in last 24 hours:  Temp:  [98 F (36.7 C)-98.8 F (37.1 C)] 98 F (36.7 C) (08/23 0700) Pulse Rate:  [65-125] 65 (08/23 0930) Resp:  [17-44] 17 (08/23 0930) BP: (115-226)/(55-167) 123/55 (08/23 0930) SpO2:  [88 %-100 %] 93 % (08/23 0930) FiO2 (%):  [50 %] 50 % (08/22 1400) Weight:  [122.2 kg (269 lb 6.4 oz)-122.6 kg (270 lb 4.5 oz)] 122.6 kg (270 lb 4.5 oz) (08/23 0500)  Weight change:  Filed Weights   07/09/17 1155 07/09/17 1630 07/10/17 0500  Weight: 122.2 kg (269 lb 6.4 oz) 122.2 kg (269 lb 6.4 oz) 122.6 kg (270 lb 4.5 oz)    Intake/Output:    Intake/Output Summary (Last 24 hours) at 07/10/17 1154 Last data filed at 07/10/17 0500  Gross per 24 hour  Intake           1161.5 ml  Output            -1550 ml  Net           2711.5 ml     Physical Exam: General: No acute distress, laying in the bed   HEENT Anicteric, moist oral mucous membranes   Neck Supple, neck veins are not distended   Pulm/lungs Clear to auscultation bilaterally, no wheezing   CVS/Heart regular, tachycardic   Abdomen:  Soft, nontender   Extremities: no pitting edema   Neurologic: Alert, oriented   Skin: No acute rashes   Access: Left forearm AV fistula        Basic Metabolic Panel:   Recent Labs Lab 07/09/17 0922 07/10/17 0303  NA 135 137  K 3.5 3.2*  CL 98* 98*  CO2 28 30  GLUCOSE 114* 90  BUN 33* 30*  CREATININE 5.37* 4.85*  CALCIUM 8.9 7.9*     CBC:  Recent Labs Lab 07/09/17 0922 07/10/17 0303  WBC 9.2 8.0  HGB 11.5* 10.5*  HCT 34.9* 31.2*  MCV 82.8 83.0  PLT 155 153      Lab Results  Component Value Date   HEPBSAG Negative 04/02/2017   HEPBSAB Reactive 04/02/2017   HEPBIGM Negative 07/10/2016      Microbiology:  Recent Results (from the past 240 hour(s))  MRSA PCR Screening     Status: None   Collection Time: 07/09/17 11:51 AM  Result Value Ref Range Status   MRSA by PCR NEGATIVE NEGATIVE Final    Comment:        The GeneXpert MRSA Assay (FDA approved for NASAL specimens only), is one component of a comprehensive MRSA colonization surveillance program. It is not intended to diagnose MRSA infection nor to guide or monitor treatment for MRSA infections.     Coagulation Studies:  Recent Labs  07/09/17 0922  LABPROT 14.9  INR 1.16    Urinalysis: No results for input(s): COLORURINE, LABSPEC, PHURINE, GLUCOSEU, HGBUR, BILIRUBINUR, KETONESUR, PROTEINUR, UROBILINOGEN, NITRITE, LEUKOCYTESUR in the last 72 hours.  Invalid input(s): APPERANCEUR    Imaging: Dg Chest 2 View  Result Date: 07/09/2017 CLINICAL DATA:  31 year old male with chest pain and shortness of breath beginning 4 days ago. Vomiting. EXAM: CHEST  2 VIEW COMPARISON:  04/01/2017 and  earlier. FINDINGS: Moderate to severe cardiomegaly, and cardiac silhouette appears increased compared to August 2017, and also the chest radiographs from May. Other mediastinal contours remain stable. Visualized tracheal air column is within normal limits. Diffuse increased pulmonary interstitial opacity with parahilar and infrahilar predominance. Only trace pleural fluid evident in the fissures. No pneumothorax. No acute osseous abnormality identified. Negative visible bowel gas pattern. IMPRESSION: 1. Increased interstitial opacity favored to be Acute Pulmonary Edema, viral/atypical respiratory infection less likely. 2. Chronic but increased cardiac silhouette suspicious for Pericardial Effusion. Electronically Signed   By: Genevie Ann M.D.   On: 07/09/2017 09:50     Medications:   . small volume/piggyback builder    . niCARDipine  Stopped (07/10/17 0900)   . amLODipine  10 mg Oral QPM  . aspirin EC  81 mg Oral Daily  . carvedilol  25 mg Oral BID WC  . docusate sodium  100 mg Oral BID  . losartan  100 mg Oral QPM   acetaminophen **OR** acetaminophen, bisacodyl, HYDROcodone-acetaminophen, nitroGLYCERIN, ondansetron **OR** ondansetron (ZOFRAN) IV  Assessment/ Plan:  31 y.o. African-American male with hypertension, childhood seizures, TIA, ESRD on hemodialysis since October 2017, presents with hypertensive emergency, acute shortness of breath  1. End-stage renal disease on hemodialysis MWF. [facility] nephrology. [place] 2. Hypertensive emergency/malignant HTN with elevated troponin/non-STEMI 3. Acute pulmonary edema 4. Anemia of chronic kidney disease 5. Secondary hyperparathyroidism  Plan: - Patient feels a lot better after volume removal with dialysis - His blood pressure is much better controlled with home regimen - Okay to discharge from renal standpoint   LOS: 1 [facility] 8/23/201811:54 [place], [place]

## 2017-07-10 NOTE — Progress Notes (Signed)
[facility] [place] NAME: Alan Gross    MR#:  [id number]  DATE OF BIRTH:  1986/03/28  SUBJECTIVE:  Patient reports missing a few sessions of dialysis since he was out of town. He also ran out of his blood pressure medications came in yesterday with increasing shortness of breath chest pain found to be in malignant hypertension and acute flash pulmonary edema. Underwent urgent hemodialysis and had significant amount of ultrafiltration currently sats more than 92% on room air blood pressure is 130/86. Patient is feeling better once to go home.  REVIEW OF SYSTEMS:   Review of Systems  Constitutional: Negative for chills, fever and weight loss.  HENT: Negative for ear discharge, ear pain and nosebleeds.   Eyes: Negative for blurred vision, pain and discharge.  Respiratory: Negative for sputum production, wheezing and stridor.   Cardiovascular: Negative for chest pain, palpitations, orthopnea and PND.  Gastrointestinal: Negative for abdominal pain, diarrhea, nausea and vomiting.  Genitourinary: Negative for frequency and urgency.  Musculoskeletal: Negative for back pain and joint pain.  Neurological: Positive for weakness. Negative for sensory change, speech change and focal weakness.  Psychiatric/Behavioral: Negative for depression and hallucinations. The patient is not nervous/anxious.    Tolerating Diet:yes Tolerating PT: Not needed  DRUG ALLERGIES:   Allergies  Allergen Reactions  . Lasix [Furosemide] Shortness Of Breath, Swelling and Anxiety    VITALS:  Blood pressure (!) 123/55, pulse 65, temperature 98 F (36.7 C), resp. rate 17, height 6\' 1"  (1.854 m), weight 122.6 kg (270 lb 4.5 oz), SpO2 93 %.  PHYSICAL EXAMINATION:   Physical Exam  GENERAL:  31 y.o.-year-old patient lying in the bed with no acute distress.  EYES: Pupils equal, round, reactive to light and accommodation. No scleral icterus. Extraocular muscles  intact.  HEENT: Head atraumatic, normocephalic. Oropharynx and nasopharynx clear.  NECK:  Supple, no jugular venous distention. No thyroid enlargement, no tenderness.  LUNGS: Normal breath sounds bilaterally, no wheezing, rales, rhonchi. No use of accessory muscles of respiration.  CARDIOVASCULAR: S1, S2 normal. No murmurs, rubs, or gallops.  ABDOMEN: Soft, nontender, nondistended. Bowel sounds present. No organomegaly or mass.  EXTREMITIES: No cyanosis, clubbing or edema b/l.    NEUROLOGIC: Cranial nerves II through XII are intact. No focal Motor or sensory deficits b/l.   PSYCHIATRIC:  patient is alert and oriented x 3.  SKIN: No obvious rash, lesion, or ulcer.   LABORATORY PANEL:  CBC  Recent Labs Lab 07/10/17 0303  WBC 8.0  HGB 10.5*  HCT 31.2*  PLT 153    Chemistries   Recent Labs Lab 07/09/17 0922 07/10/17 0303  NA 135 137  K 3.5 3.2*  CL 98* 98*  CO2 28 30  GLUCOSE 114* 90  BUN 33* 30*  CREATININE 5.37* 4.85*  CALCIUM 8.9 7.9*  AST 22  --   ALT 16*  --   ALKPHOS 62  --   BILITOT 0.6  --    Cardiac Enzymes  Recent Labs Lab 07/10/17 0303  TROPONINI 0.40*   RADIOLOGY:  Dg Chest 2 View  Result Date: 07/09/2017 CLINICAL DATA:  31 year old male with chest pain and shortness of breath beginning 4 days ago. Vomiting. EXAM: CHEST  2 VIEW COMPARISON:  04/01/2017 and earlier. FINDINGS: Moderate to severe cardiomegaly, and cardiac silhouette appears increased compared to August 2017, and also the chest radiographs from May. Other mediastinal contours remain stable. Visualized tracheal air column is within normal limits. Diffuse increased  pulmonary interstitial opacity with parahilar and infrahilar predominance. Only trace pleural fluid evident in the fissures. No pneumothorax. No acute osseous abnormality identified. Negative visible bowel gas pattern. IMPRESSION: 1. Increased interstitial opacity favored to be Acute Pulmonary Edema, viral/atypical respiratory infection  less likely. 2. Chronic but increased cardiac silhouette suspicious for Pericardial Effusion. Electronically Signed   By: Genevie Ann M.D.   On: 07/09/2017 09:50   ASSESSMENT AND PLAN:  Alan Gross  is a 31 y.o. male with a known history of Essential hypertension, ESRD on hemodialysis Monday, Wednesday, Friday, at came in because of severe nausea, shortness of breath and left-sided chest pain. Patient has been having chest pain on the left side going to the left arm and got worse since yesterday associated shortness of breath.    #1. Hypertensive urgency with severely elevated blood pressure 220/126. -Patient was started on Cardene drip.--- Weaned off and patient is back on his oral home meds  -Continue amlodipine 10 mg, Coreg 25 mg, losartan 100 mg daily  2 acute hypoxic respiratory failure secondary to acute pulmonary edema in the setting of malignant hypertension  -Patient has ESRD: On hemodialysis -Patient was found to be in flash pulmonary edema -He underwent urgent ultrafiltration and now much better sats improved he is off oxygen Patient will resume outpatient dialysis schedule   #3 .elevated troponins with chest pain likely secondary to demand ischemia from shortness of breath, hypertensive urgency. 0.10.   #4 DVT prophylaxis subcutaneous heparin  #5 urine drug screen positive for cannabinoids patient advised abstinence Patient is hemodynamically stable. Patient will discharge to home. Spoke with Hinton Dyer, ICU nurse practitioner.  Case discussed with Care Management/Social Worker. Management plans discussed with the patient, family and they are in agreement.  CODE STATUS: Full  DVT Prophylaxis: Heparin  TOTAL TIME TAKING CARE OF THIS PATIENT: 30 minutes.  >50% time spent on counselling and coordination of care    Note: This dictation was prepared with Dragon dictation along with smaller phrase technology. Any transcriptional errors that result from this process are  unintentional.  Khaliah Barnick M.D on 07/10/2017 at 12:32 PM  Between 7am to 6pm - Pager - [phone]  After 6pm go to [email or web address] - password [email or web address] [facility]  Office  [phone]  CC: Primary care physician; [name], DO

## 2017-07-10 NOTE — Progress Notes (Signed)
Name: Alan Gross MRN:   [id number] DOB:   Apr 05, 1986          BRIEF PATIENT DESCRIPTION:  31 year old male admitted 08/22 with acute hypoxic respiratory failure secondary to pulmonary edema, hypertensive emergency, and chest pain with elevated troponin's requiring Bipap and Nicardipine gtt  SIGNIFICANT EVENTS  08/22-Pt admitted to the Stepdown Unit   STUDIES:  Echo 08/22>>  SUBJECTIVE: Pt denies shortness of breath, bp improved nicardipine gtt stopped at 09:00.  Pt stating he would like to go home today.   VITAL SIGNS: Temp:  [98.5 F (36.9 C)] 98.5 F (36.9 C) (08/22 1150) Pulse Rate:  [31-113] 97 (08/22 1230) Resp:  [17-44] 24 (08/22 1230) BP: (179-226)/(112-178) 179/114 (08/22 1230) SpO2:  [87 %-98 %] 93 % (08/22 1230) FiO2 (%):  [50 %] 50 % (08/22 1159) Weight:  [116 kg (255 lb 11.7 oz)-122.2 kg (269 lb 6.4 oz)] 122.2 kg (269 lb 6.4 oz) (08/22 1155)  PHYSICAL EXAMINATION: General: Well-developed, well-nourished, AA male, NAD Neuro: Alert and oriented, follows commands HEENT: Supple, no JVD Cardiovascular: sinus tach, s1s2, no M/R/G Lungs: clear throughout, even, nonlabored; no wheezes, rales, or rhonchi, on BiPAP Abdomen: Positive bowel sounds 4, obese, soft, nontender, nondistended Musculoskeletal: Normal tone, 1+ bilateral lower extremity edema Skin: Intact, no rashes or lesions, left wrist AV fistula positive bruit and thrill   ASSESSMENT / PLAN: Acute hypoxic respiratory failure secondary to pulmonary edema Elevated troponin's secondary to demand ischemia Hypertensive emergency CKD Hx: Stroke P: Supplemental O2 to maintain O2 sats >92% Nephrology consulted appreciate input-HD per recommendations  Cardiology consulted appreciate input  Continue aspirin, amlodipine, carvedilol, and losartan Continuous telemetry monitoring Trend BMP Replace electrolytes as indicated Daily weights Trend CBC Monitor for s/sx of bleeding Prn sublingual nitroglycerin  and Norco for pain management  If pts bp remains stable will discharge pt home Nephrology has seen pt and agrees with potential discharge later today.  Marda Stalker, [place] Pager [phone] (please enter 7 digits) PCCM Consult Pager [phone] (please enter 7 digits)  PCCM ATTENDING ATTESTATION:  I have evaluated patient with the APP [place], reviewed database in its entirety and discussed care plan in detail. In addition, this patient was discussed on multidisciplinary rounds.   Important exam findings: No distress No neurologic findings HEENT WNL Chest clear Regular, no M NABS, NT Trace symmetric BLE edema  Major problems addressed by PCCM team: Pulmonary edema - resolved Hypertensive emergency - resolved Hypertension, controlled ESRD   PLAN/REC: Transfer to [facility] regarding need to comply with antihypertensive regimen After transfer, PCCM will sign off. Please call if we can be of further assistance    Merton Border, MD PCCM service Mobile [phone] Pager [phone] 07/10/2017 12:09 PM

## 2017-07-10 NOTE — Progress Notes (Signed)
Patient alert and oriented. No complaints of pain. Has been on room air, oxygenating mid 90's. Tolerating diet and using urinal. Removed IV's and helped get dressed. Dr Candiss Norse aware of patients blood pressure being in the 140-160's. Patient being discharged home.

## 2017-07-10 NOTE — Discharge Summary (Signed)
Physician Discharge Summary  Patient ID: Alan Gross MRN: [id number] DOB/AGE: 33-Dec-1987 33 y.o.  Admit date: 07/09/2017 Discharge date: 07/10/2017    Discharge Diagnoses:        Acute hypoxic respiratory failure secondary to pulmonary edema-resolved                                        Elevated troponin's secondary to demand ischemia due to hypertension ruled out for       NSTEMI       Hypertensive emergency with nausea and vomiting-resolved       CKD-Hemodialysis M-W-F                                                                      DISCHARGE PLAN BY DIAGNOSIS    Acute hypoxic respiratory failure secondary to pulmonary edema-resolved CKD-Hemodialysis M-W-F Plan: Continue HD schedule per outpatient Nephrology recommendations: M-W-F Discussed with pt importance of medication compliance and compliance with Hemodialysis sessions  Instructed pt to return to ER if symptoms reoccur   Hypertension  Plan: Nicardipine gtt weaned off bp stable prior to discharge  Continue outpatient amlodipine, carvedilol, losartan, and aspirin  Follow-up with PCP in 1-2 weeks of discharge.  Pt states he is in the process of getting established as a new patient with Dr. Lavera Guise at [place]   Alan Gross is a 31 y.o. y/o male with a PMH of stroke no residual deficits, CKD on hemodialysis M-W-F, hypertension, febrile seizures, dyspnea, depression, and anxiety.  He presented to the ER 08/22 with c/o chest pain, shortness of breath, nausea, vomiting, and back pain onset of symptoms 4 days prior to presentation.  Due to symptoms he was unable to keep his blood pressure medications down, therefore in the ER blood pressure was 214/155. He had an outpatient dialysis treatment on 08/20, however he was only able to complete 1 hour of the session due to chest pain and shortness of breath.  In the ER he was found to be hypoxic with O2 sats 87% on room  air, therefore he was placed on 3L O2. CXR revealed pulmonary edema and troponin's were slightly elevated at 0.10, he was placed on heparin and nicardipine gtt's and given 10 mg iv labetalol x 1 dose and admitted to the ICU.  He ultimately received emergent hemodialysis 08/22 with improvement of respiratory status and was transitioned off Bipap.  The nicardipine gtt was discontinued on 08/23 and his outpatient antihypertensive medications were restarted bp stabilized.  Cardiology evaluated pt due to elevated troponin's and ruled pt out for an MI determining elevated troponin's secondary to demand ischemia due to hypertension and pulmonary edema.  SIGNIFICANT DIAGNOSTIC STUDIES Echo 08/23>>  SIGNIFICANT EVENTS 08/22-Pt admitted to the [facility] Unit   MICRO DATA  None   ANTIBIOTICS None   CONSULTS [facility]   TUBES / LINES None   Discharge Exam: General: well developed, well nourished AA male, resting in bed, in NAD. Neuro: A&O x 3, non-focal.  HEENT: [place]/AT. PERRL, sclerae anicteric. Cardiovascular:  RRR, no M/R/G.  Lungs: Respirations even and unlabored. CTA bilaterally, No W/R/R.  Abdomen: BS x 4, soft, NT/ND.  Musculoskeletal: No gross deformities, no edema.  Skin: Intact, warm, no rashes.   Vitals:   07/10/17 0719 07/10/17 0830 07/10/17 0900 07/10/17 0930  BP: 134/84 (!) 147/78 (!) 115/57 (!) 123/55  Pulse:  84 79 65  Resp: (!) 22 (!) 23 (!) 23 17  Temp:      TempSrc:      SpO2: 96% 91% (!) 88% 93%  Weight:      Height:         Discharge Labs  BMET  Recent Labs Lab 07/09/17 0922 07/10/17 0303  NA 135 137  K 3.5 3.2*  CL 98* 98*  CO2 28 30  GLUCOSE 114* 90  BUN 33* 30*  CREATININE 5.37* 4.85*  CALCIUM 8.9 7.9*    CBC  Recent Labs Lab 07/09/17 0922 07/10/17 0303  HGB 11.5* 10.5*  HCT 34.9* 31.2*  WBC 9.2 8.0  PLT 155 153    Anti-Coagulation  Recent Labs Lab 07/09/17 0922  INR 1.16       Follow-up Information     [name], DO .   Specialty:  Family Medicine Contact information: [address] [place] [address] [phone]        PCP. Schedule an appointment as soon as possible for a visit in 2 week(s).   Why:  Follow-up with PCP 1-2 weeks within discharge            Allergies as of 07/10/2017      Reactions   Lasix [furosemide] Shortness Of Breath, Swelling, Anxiety      Medication List    STOP taking these medications   hydrALAZINE 50 MG tablet Commonly known as:  APRESOLINE     TAKE these medications   amLODipine 10 MG tablet Commonly known as:  NORVASC Take 1 tablet (10 mg total) by mouth every evening.   aspirin 81 MG tablet Take 1 tablet (81 mg total) by mouth daily.   carvedilol 25 MG tablet Commonly known as:  COREG Take 1 tablet (25 mg total) by mouth 2 (two) times daily with a meal.   losartan 100 MG tablet Commonly known as:  COZAAR Take 1 tablet (100 mg total) by mouth every evening.   oxyCODONE-acetaminophen 5-325 MG tablet Commonly known as:  ROXICET Take 1 tablet by mouth every 6 (six) hours as needed for severe pain.            Discharge Care Instructions        Start     Ordered   07/10/17 0000  amLODipine (NORVASC) 10 MG tablet  Every evening     07/10/17 1212   07/10/17 0000  losartan (COZAAR) 100 MG tablet  Every evening     07/10/17 1212   07/10/17 0000  carvedilol (COREG) 25 MG tablet  2 times daily with meals     07/10/17 1212       Disposition: Pt stable to discharge home.  Discharged Condition: LONN IM has met maximum benefit of inpatient care and is medically stable and cleared for discharge.  Patient is pending follow up as above.     Marda Stalker, [place] Pager [phone] (please enter 7 digits) PCCM Consult Pager [phone] (please enter 7 digits)

## 2017-07-10 NOTE — Progress Notes (Signed)
*  PRELIMINARY RESULTS* Echocardiogram 2D Echocardiogram has been performed.  Sherrie Sport 07/10/2017, 9:53 AM

## 2017-08-18 ENCOUNTER — Emergency Department: Payer: 59

## 2017-08-18 ENCOUNTER — Encounter: Payer: Self-pay | Admitting: Emergency Medicine

## 2017-08-18 ENCOUNTER — Inpatient Hospital Stay
Admission: EM | Admit: 2017-08-18 | Discharge: 2017-08-18 | DRG: 313 | Discharge status: Against Medical Advice | Payer: 59 | Attending: Internal Medicine | Admitting: Internal Medicine

## 2017-08-18 DIAGNOSIS — Z8249 Family history of ischemic heart disease and other diseases of the circulatory system: Secondary | ICD-10-CM | POA: Diagnosis not present

## 2017-08-18 DIAGNOSIS — R079 Chest pain, unspecified: Secondary | ICD-10-CM | POA: Diagnosis present

## 2017-08-18 DIAGNOSIS — E876 Hypokalemia: Secondary | ICD-10-CM

## 2017-08-18 DIAGNOSIS — Z888 Allergy status to other drugs, medicaments and biological substances status: Secondary | ICD-10-CM | POA: Diagnosis not present

## 2017-08-18 DIAGNOSIS — F419 Anxiety disorder, unspecified: Secondary | ICD-10-CM | POA: Diagnosis present

## 2017-08-18 DIAGNOSIS — I120 Hypertensive chronic kidney disease with stage 5 chronic kidney disease or end stage renal disease: Secondary | ICD-10-CM | POA: Diagnosis present

## 2017-08-18 DIAGNOSIS — Z79899 Other long term (current) drug therapy: Secondary | ICD-10-CM | POA: Diagnosis not present

## 2017-08-18 DIAGNOSIS — N186 End stage renal disease: Secondary | ICD-10-CM | POA: Diagnosis present

## 2017-08-18 DIAGNOSIS — R45850 Homicidal ideations: Secondary | ICD-10-CM | POA: Diagnosis present

## 2017-08-18 DIAGNOSIS — I509 Heart failure, unspecified: Secondary | ICD-10-CM

## 2017-08-18 DIAGNOSIS — I10 Essential (primary) hypertension: Secondary | ICD-10-CM

## 2017-08-18 DIAGNOSIS — F4325 Adjustment disorder with mixed disturbance of emotions and conduct: Secondary | ICD-10-CM

## 2017-08-18 DIAGNOSIS — Z992 Dependence on renal dialysis: Secondary | ICD-10-CM

## 2017-08-18 DIAGNOSIS — Z87891 Personal history of nicotine dependence: Secondary | ICD-10-CM | POA: Diagnosis not present

## 2017-08-18 DIAGNOSIS — Z7982 Long term (current) use of aspirin: Secondary | ICD-10-CM | POA: Diagnosis not present

## 2017-08-18 DIAGNOSIS — R748 Abnormal levels of other serum enzymes: Secondary | ICD-10-CM | POA: Diagnosis present

## 2017-08-18 DIAGNOSIS — R778 Other specified abnormalities of plasma proteins: Secondary | ICD-10-CM

## 2017-08-18 DIAGNOSIS — R7989 Other specified abnormal findings of blood chemistry: Secondary | ICD-10-CM | POA: Diagnosis present

## 2017-08-18 LAB — CBC
HCT: 40.7 % (ref 40.0–52.0)
HEMOGLOBIN: 13.7 g/dL (ref 13.0–18.0)
MCH: 27.8 pg (ref 26.0–34.0)
MCHC: 33.6 g/dL (ref 32.0–36.0)
MCV: 82.7 fL (ref 80.0–100.0)
Platelets: 183 10*3/uL (ref 150–440)
RBC: 4.93 MIL/uL (ref 4.40–5.90)
RDW: 18 % — ABNORMAL HIGH (ref 11.5–14.5)
WBC: 10.2 10*3/uL (ref 3.8–10.6)

## 2017-08-18 LAB — BASIC METABOLIC PANEL
ANION GAP: 11 (ref 5–15)
BUN: 27 mg/dL — ABNORMAL HIGH (ref 6–20)
CALCIUM: 9.2 mg/dL (ref 8.9–10.3)
CO2: 28 mmol/L (ref 22–32)
Chloride: 99 mmol/L — ABNORMAL LOW (ref 101–111)
Creatinine, Ser: 4.27 mg/dL — ABNORMAL HIGH (ref 0.61–1.24)
GFR calc non Af Amer: 17 mL/min — ABNORMAL LOW (ref >60)
GFR, EST AFRICAN AMERICAN: 20 mL/min — AB (ref >60)
Glucose, Bld: 104 mg/dL — ABNORMAL HIGH (ref 65–99)
Potassium: 3.2 mmol/L — ABNORMAL LOW (ref 3.5–5.1)
Sodium: 138 mmol/L (ref 135–145)

## 2017-08-18 LAB — TROPONIN I: TROPONIN I: 0.07 ng/mL — AB (ref <0.03)

## 2017-08-18 LAB — BRAIN NATRIURETIC PEPTIDE: B NATRIURETIC PEPTIDE 5: 1545 pg/mL — AB (ref 0.0–100.0)

## 2017-08-18 MED ORDER — NITROGLYCERIN 0.4 MG SL SUBL
0.4000 mg | SUBLINGUAL_TABLET | SUBLINGUAL | Status: DC | PRN
  Administered 2017-08-18: 0.4 mg via SUBLINGUAL

## 2017-08-18 MED ORDER — AMLODIPINE BESYLATE 5 MG PO TABS: 10.0000 mg | ORAL_TABLET | Freq: Every day | ORAL | Status: DC

## 2017-08-18 MED ORDER — ASPIRIN 81 MG PO CHEW
324.0000 mg | CHEWABLE_TABLET | Freq: Once | ORAL | Status: AC
  Administered 2017-08-18: 324 mg via ORAL

## 2017-08-18 MED ORDER — CARVEDILOL 25 MG PO TABS: 25.0000 mg | ORAL_TABLET | Freq: Two times a day (BID) | ORAL | Status: DC

## 2017-08-18 MED ORDER — HYDRALAZINE HCL 20 MG/ML IJ SOLN
10.0000 mg | Freq: Four times a day (QID) | INTRAMUSCULAR | Status: DC | PRN
  Filled 2017-08-18: qty 1

## 2017-08-18 MED ORDER — NITROGLYCERIN 2 % TD OINT
0.5000 [in_us] | TOPICAL_OINTMENT | Freq: Four times a day (QID) | TRANSDERMAL | Status: DC
Start: 2017-08-18 — End: 2017-08-18
  Filled 2017-08-18: qty 1

## 2017-08-18 NOTE — ED Provider Notes (Addendum)
[facility] Emergency Department Provider Note  ____________________________________________  Time seen: Approximately 12:09 PM  I have reviewed the triage vital signs and the nursing notes.   HISTORY  Chief Complaint Chest Pain    HPI Alan Gross is a 31 y.o. male with a recent history of an STEMI 07/09/17, ESRD on HD, presenting with chest pain and shortness of breath. The patient reports that he was approximately one hour into his hemodialysis when he developed a tight sensation just below the neck associated with shortness of breath "like someone was strangling me." This sensation persisted until he was in the ambulance and is now a 1 out of 10. He denies any associated diaphoresis, lightheadedness or syncope, nausea or vomiting. He has not had any recent illness. He was admitted for hypertensive emergency 8/22 to 07/10/2017, and underwent echocardiogram showing significant left ventricular concentric hypertrophy but otherwise no significant abnormality. Hislast cardiac catheterization was in 2016.   Past Medical History:  Diagnosis Date  . Anxiety   . Depression   . Dyspnea   . H/O cardiac catheterization   . History of febrile seizure   . Hypertension   . Irregular heart beats   . Kidney failure    M,W,F [facility] in [place]; 15 %   . 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 Hypertensive urgency 04/01/2017  . ESRD (end stage renal disease) on dialysis ([place]) 07/19/2016  . Anxiety 07/19/2016  . Heart failure, diastolic, due to HTN ([place]) 07/19/2016  . Costochondritis 07/19/2016  . HTN (hypertension) 07/09/2016    Past Surgical History:  Procedure Laterality Date  . A/V FISTULAGRAM N/A 05/07/2017   Procedure: A/V Fistulagram;  Surgeon: Algernon Huxley, MD;  Location: [place] [facility];  Service: Cardiovascular;  Laterality: N/A;  . AV FISTULA PLACEMENT Left 09/10/2016   Procedure:  LEFT ARTERIOVENOUS (AV) FISTULA CREATION;  Surgeon: Conrad [place], MD;  Location: [place];  Service: Vascular;  Laterality: Left;  . CARDIAC CATHETERIZATION Left 05/02/2015   Procedure: Left Heart Cath and Coronary Angiography;  Surgeon: Dionisio David, MD;  Location: [place] [facility];  Service: Cardiovascular;  Laterality: Left;  . CORONARY ANGIOPLASTY    . PERIPHERAL VASCULAR CATHETERIZATION N/A 07/11/2016   Procedure: Dialysis/Perma Catheter Insertion;  Surgeon: Algernon Huxley, MD;  Location: [place] [facility];  Service: Cardiovascular;  Laterality: N/A;  . PERIPHERAL VASCULAR THROMBECTOMY Left 05/07/2017   Procedure: Peripheral Vascular Thrombectomy;  Surgeon: Algernon Huxley, MD;  Location: [place] [facility];  Service: Cardiovascular;  Laterality: Left;    Current Outpatient Rx  . Order #: [id number] Class: Normal  . Order #: [id number] Class: Normal  . Order #: [id number] Class: Normal  . Order #: [id number] Class: OTC  . Order #: [id number] Class: Print    Allergies Lasix [furosemide]  Family History  Problem Relation Age of Onset  . Hypertension Other   . Cancer Father        liver  . Hypertension Brother   . Diabetes Paternal Aunt   . Diabetes Paternal Uncle     Social History Social History  Substance Use Topics  . Smoking status: Former Smoker    Packs/day: 0.25    Years: 10.00    Types: Cigarettes    Quit date: 06/22/2016  . Smokeless tobacco: Never Used     Comment: smokes 1-2 cigarettes daily  . Alcohol use No  Review of Systems Constitutional: No fever/chills.lightheadedness or syncope. Positive general malaise. Eyes: No visual changes. ENT: No sore throat. No congestion or rhinorrhea. Cardiovascular: positivechest pain. Denies palpitations. Respiratory: positiveshortness of breath.  No cough. Gastrointestinal: No abdominal pain.  No nausea, no vomiting.  No diarrhea.  No constipation. Genitourinary: Negative for dysuria. Musculoskeletal: Negative for back  pain. Skin: Negative for rash. Neurological: Negative for headaches. No focal numbness, tingling or weakness.     ____________________________________________   PHYSICAL EXAM:  VITAL SIGNS: ED Triage Vitals  Enc Vitals Group     BP --      Pulse Rate 08/18/17 1154 85     Resp 08/18/17 1154 16     Temp 08/18/17 1154 97.8 F (36.6 C)     Temp Source 08/18/17 1154 Oral     SpO2 08/18/17 1154 94 %     Weight 08/18/17 1155 260 lb (117.9 kg)     Height 08/18/17 1155 6\' 1"  (1.854 m)     Head Circumference --      Peak Flow --      Pain Score 08/18/17 1154 2     Pain Loc --      Pain Edu? --      Excl. in [place]? --     Constitutional: Alert and oriented. Well appearing and in no acute distress. Answers questions appropriately. Eyes: Conjunctivae are normal.  EOMI. No scleral icterus. Head: Atraumatic. Nose: No congestion/rhinnorhea. Mouth/Throat: Mucous membranes are moist.  Neck: No stridor.  Supple.  Positive JVD. No meningismus. Cardiovascular: Normal rate, regular rhythm. No murmurs, rubs or gallops.  Respiratory: Normal respiratory effort.  No accessory muscle use or retractions. Lungs CTAB.  No wheezes, rales or ronchi. Gastrointestinal: Soft, nontender and nondistended.  No guarding or rebound.  No peritoneal signs. Musculoskeletal: No LE edema. No ttp in the calves or palpable cords.  Negative Homan's sign. Neurologic:  A&Ox3.  Speech is clear.  Face and smile are symmetric.  EOMI.  Moves all extremities well. Skin:  Skin is warm, dry and intact. No rash noted. Psychiatric: Mood and affect are normal. Speech and behavior are normal.  Normal judgement  ____________________________________________   LABS (all labs ordered are listed, but only abnormal results are displayed)  Labs Reviewed  BASIC METABOLIC PANEL - Abnormal; Notable for the following:       Result Value   Potassium 3.2 (*)    Chloride 99 (*)    Glucose, Bld 104 (*)    BUN 27 (*)    Creatinine, Ser  4.27 (*)    GFR calc non Af Amer 17 (*)    GFR calc Af Amer 20 (*)    All other components within normal limits  CBC - Abnormal; Notable for the following:    RDW 18.0 (*)    All other components within normal limits  TROPONIN I - Abnormal; Notable for the following:    Troponin I 0.07 (*)    All other components within normal limits  BRAIN NATRIURETIC PEPTIDE - Abnormal; Notable for the following:    B Natriuretic Peptide 1,545.0 (*)    All other components within normal limits   ____________________________________________  EKG  ED ECG REPORT I, Eula Listen, the attending physician, personally viewed and interpreted this ECG.   Date: 08/18/2017  EKG Time: 1155  Rate: 85  Rhythm: normal sinus rhythm  Axis: leftward  Intervals:first degree block  ST&T Change: 0.17mm ST elevation V1 V3, V4 with 0.7mm ST depression V6  Overall, this is grossly unchanged in morphology compared to his prior EKG from last hospitalization, but is still concerning for ischemic changes per  ____________________________________________  RADIOLOGY  Dg Chest 2 View  Result Date: 08/18/2017 CLINICAL DATA:  Chest pain while on dialysis EXAM: CHEST  2 VIEW COMPARISON:  07/09/2017 FINDINGS: Cardiac shadow is mildly enlarged. The lungs are well aerated bilaterally. No focal infiltrate or sizable effusion is seen. No bony abnormality is noted. IMPRESSION: No active cardiopulmonary disease. Electronically Signed   By: Inez Catalina M.D.   On: 08/18/2017 12:59    ____________________________________________   PROCEDURES  Procedure(s) performed: None  Procedures  Critical Care performed: No ____________________________________________   INITIAL IMPRESSION / ASSESSMENT AND PLAN / ED COURSE  Pertinent labs & imaging results that were available during my care of the patient were reviewed by me and considered in my medical decision making (see chart for details).  31 y.o. Male with a  history of ESRD on HD, recent and STEMI with left ventricular hypertrophy but EF 50-60% on last ECHO resenting for chest pain while on dialysis, now mostly resolved. Overall, the patient is hypertensive, but mentating normally. He has a normal cardiopulmonary examination without evidence of significant fluid overload. I am concerned about ACS or MI and while the patient has had an echo that is reassuring other than the hypertrophy recently. He has not had any stress imaging nor cardiac catheterization for risk stratification in the recent past. Aortic pathology is very unlikely. The patient does not have pleuritic pain or other clinical or history findings consistent with PE, this diagnosis is unlikely. There is no evidence for an acute infectious process today. Patient will recheck keep nitroglycerin for his hypertension and chest pain  ASA has been ordered. Plan reevaluation for final disposition.  ----------------------------------------- 1:39 PM on 08/18/2017 -----------------------------------------  At this time, the patient is completely symptomatic and his blood pressure has improved although he remains hypertensive in the 170s/110's. He does have a positive troponin, which may be a down trending troponin from his last hospitalization, but given his active chest pain today, I am concerned about new ACS or MI. The patient has not had his coronary arteries looked at directly, and I have recommended admission to the hospital. At this time, the patient states that "I got something to do in [place]" and he is requesting to leave AMA after long discussion about the risks including worsening symptoms, lifelong morbidity, mortality.After some discussion, he states that his marriage dissolved yesterday, and does state that he wants to hurt somebody although he does not have a specific person in mind he states "I just want to inflict pain." He denies SI or hallucinations. At this time, the patient is  criteria for IVC, as well as admission to the hospital.  The patient does have some hypokalemia today, but given that he has end-stage renal disease and did not complete dialysis, I will not supplement this at this time.  ----------------------------------------- 2:29 PM on 08/18/2017 -----------------------------------------  The patient has been evaluated by Dr. Carlena Hurl, who feels that he had an acute stress reaction and is unlikely to be harmful to himself or anybody else. Dr. Carlena Hurl has taken the patient off of involuntary commitment at this time. He continues to refuse inpatient admission for his chest pain with elevated troponin as well as hypertension. At this time, the patient will be leaving [place]. He understands my return precautions.  CRITICAL CARE Performed by: [name], [place]   Total critical  care time: 60 minutes  Critical care time was exclusive of separately billable procedures and treating other patients.  Critical care was necessary to treat or prevent imminent or life-threatening deterioration.  Critical care was time spent personally by me on the following activities: development of treatment plan with patient and/or surrogate as well as nursing, discussions with consultants, evaluation of patient's response to treatment, examination of patient, obtaining history from patient or surrogate, ordering and performing treatments and interventions, ordering and review of laboratory studies, ordering and review of radiographic studies, pulse oximetry and re-evaluation of patient's condition.  ____________________________________________  FINAL CLINICAL IMPRESSION(S) / ED DIAGNOSES  Final diagnoses:  Chest pain, unspecified type  Elevated troponin  Homicidal ideation         NEW MEDICATIONS STARTED DURING THIS VISIT:  New Prescriptions   No medications on file      Eula Listen, MD 08/18/17 1344    Eula Listen,  MD 08/18/17 1430    Eula Listen, MD 08/18/17 1501

## 2017-08-18 NOTE — Consult Note (Signed)
Medical Consultation  Alan Gross XMI:[id number] DOB: Nov 15, 1986 DOA: 08/18/2017 PCP: Alan Hauser, DO   Requesting physician: dr [name] Date of consultation: 08/18/2017 Reason for consultation: chest pain  Impression/Recommendations  31 year old male with end-stage renal disease on hemodialysis, recent discharge from the hospital with hypertensive urgency and elevated troponin due to demand ischemia who presents from dialysis due to chest pain.   1. Elevated troponin with chest pain with concerns of non-ST elevation MI It has been explained to patient that he should stay in the hospital for further evaluation. If this is a non-ST elevation MI he could pop [place] suffer further damage or even death He accepts the risks.  2. Accelerated hypertension: Patient will resume his outpatient medications and have outpatient follow-up for his blood pressure.  3. ESRD on hemodialysis: Patient will continue with dialysis on Monday, Wednesday and Friday  4. Homicidal ideation: Patient of value by psychiatry  Chief Complaint:  Chest pain  HPI:  31 year old male with end-stage renal disease on hemodialysis who presents from dialysis due to chest pain. Patient reports that he developed chest pressure which radiated to his jaw that woke him up during dialysis. He thinks this is due to anxiety. He has been struggling over the past few weeks with his marriage and feels that he is under a great amount of stress and anxiety. His chest pressure lasted approximately 5 minutes. He does not want to stay in the hospital. While speaking with the ER physician he endorsed some homicidal thoughts I asked psychiatrist to see patient in consultation as the ER physician placed an IVC.   Review of Systems  Constitutional: Negative for fever, chills weight loss HENT: Negative for ear pain, nosebleeds, congestion, facial swelling, rhinorrhea, neck pain, neck stiffness and ear discharge.   Respiratory:  Negative for cough, shortness of breath, wheezing  Cardiovascular: He was having anxiety with chest pressure which is resolved  Gastrointestinal: Negative for heartburn, abdominal pain, vomiting, diarrhea or consitpation Genitourinary: Negative for dysuria, urgency, frequency, hematuria Musculoskeletal: Negative for back pain or joint pain Neurological: Negative for dizziness, seizures, syncope, focal weakness,  numbness and headaches.  Hematological: Does not bruise/bleed easily.  Psychiatric/Behavioral: Negative for hallucinations, confusion, dysphoric mood positive anxiety   Past Medical History:  Diagnosis Date  . Anxiety   . Depression   . Dyspnea   . H/O cardiac catheterization   . History of febrile seizure   . Hypertension   . Irregular heart beats   . Kidney failure    M,W,F [facility] in [place]; 15 %   . Stroke (cerebrum) ([place])    no residual effects   Past Surgical History:  Procedure Laterality Date  . A/V FISTULAGRAM N/A 05/07/2017   Procedure: A/V Fistulagram;  Surgeon: Algernon Huxley, MD;  Location: [place] [facility];  Service: Cardiovascular;  Laterality: N/A;  . AV FISTULA PLACEMENT Left 09/10/2016   Procedure: LEFT ARTERIOVENOUS (AV) FISTULA CREATION;  Surgeon: Conrad Napoleon, MD;  Location: [place];  Service: Vascular;  Laterality: Left;  . CARDIAC CATHETERIZATION Left 05/02/2015   Procedure: Left Heart Cath and Coronary Angiography;  Surgeon: Dionisio David, MD;  Location: [place] [facility];  Service: Cardiovascular;  Laterality: Left;  . CORONARY ANGIOPLASTY    . PERIPHERAL VASCULAR CATHETERIZATION N/A 07/11/2016   Procedure: Dialysis/Perma Catheter Insertion;  Surgeon: Algernon Huxley, MD;  Location: [place] [facility];  Service: Cardiovascular;  Laterality: N/A;  . PERIPHERAL VASCULAR THROMBECTOMY Left 05/07/2017   Procedure: Peripheral  Vascular Thrombectomy;  Surgeon: Algernon Huxley, MD;  Location: [place] [facility];  Service: Cardiovascular;  Laterality: Left;    Social History:  reports that he quit smoking about 13 months ago. His smoking use included Cigarettes. He has a 2.50 pack-year smoking history. He has never used smokeless tobacco. He reports that he uses drugs, including Marijuana. He reports that he does not drink alcohol.  Allergies  Allergen Reactions  . Lasix [Furosemide] Shortness Of Breath, Swelling and Anxiety   Family History  Problem Relation Age of Onset  . Hypertension Other   . Cancer Father        liver  . Hypertension Brother   . Diabetes Paternal Aunt   . Diabetes Paternal Uncle     Prior to Admission medications   Medication Sig Start Date End Date Taking? Authorizing Provider  amLODipine (NORVASC) 10 MG tablet Take 1 tablet (10 mg total) by mouth every evening. 07/10/17  Yes Awilda Bill, NP  carvedilol (COREG) 25 MG tablet Take 1 tablet (25 mg total) by mouth 2 (two) times daily with a meal. 07/10/17  Yes Awilda Bill, NP  losartan (COZAAR) 100 MG tablet Take 1 tablet (100 mg total) by mouth every evening. 07/10/17  Yes Awilda Bill, NP  aspirin 81 MG tablet Take 1 tablet (81 mg total) by mouth daily. Patient not taking: Reported on 05/07/2017 07/19/16   Alan Hauser, DO  oxyCODONE-acetaminophen (ROXICET) 5-325 MG tablet Take 1 tablet by mouth every 6 (six) hours as needed for severe pain. Patient not taking: Reported on 05/07/2017 09/10/16   Gabriel Earing, PA-C    Physical Exam: Blood pressure (!) 172/112, pulse 66, temperature 97.8 F (36.6 C), temperature source Oral, resp. rate 20, height 6\' 1"  (1.854 m), weight 117.9 kg (260 lb), SpO2 100 %. @VITALS2 @ Filed Weights   08/18/17 1155  Weight: 117.9 kg (260 lb)   No intake or output data in the 24 hours ending 08/18/17 1404   Constitutional: Appears well-developed and well-nourished. No distress. HENT: Normocephalic. Marland Kitchen Oropharynx is clear and moist.  Eyes: Conjunctivae and EOM are normal. PERRLA, no scleral icterus.  Neck: Normal  ROM. Neck supple. No JVD. No tracheal deviation. CVS: RRR, S1/S2 +, no murmurs, no gallops, no carotid bruit.  Pulmonary: Effort and breath sounds normal, no stridor, rhonchi, wheezes, rales.  Abdominal: Soft. BS +,  no distension, tenderness, rebound or guarding.  Musculoskeletal: Normal range of motion. No edema and no tenderness.  Neuro: Alert. CN 2-12 grossly intact. No focal deficits. Skin: Skin is warm and dry. No rash noted. Psychiatric: Normal mood and affect.    Labs  Basic Metabolic Panel:  Recent Labs Lab 08/18/17 1202  NA 138  K 3.2*  CL 99*  CO2 28  GLUCOSE 104*  BUN 27*  CREATININE 4.27*  CALCIUM 9.2   Liver Function Tests: No results for input(s): AST, ALT, ALKPHOS, BILITOT, PROT, ALBUMIN in the last 168 hours. No results for input(s): LIPASE, AMYLASE in the last 168 hours.  CBC:  Recent Labs Lab 08/18/17 1202  WBC 10.2  HGB 13.7  HCT 40.7  MCV 82.7  PLT 183   Cardiac Enzymes:  Recent Labs Lab 08/18/17 1202  TROPONINI 0.07*   BNP: Invalid input(s): POCBNP CBG: No results for input(s): GLUCAP in the last 168 hours.  Radiological Exams: Dg Chest 2 View  Result Date: 08/18/2017 CLINICAL DATA:  Chest pain while on dialysis EXAM: CHEST  2 VIEW COMPARISON:  07/09/2017 FINDINGS: Cardiac shadow is mildly enlarged. The lungs are well aerated bilaterally. No focal infiltrate or sizable effusion is seen. No bony abnormality is noted. IMPRESSION: No active cardiopulmonary disease. Electronically Signed   By: Inez Catalina M.D.   On: 08/18/2017 12:59    EKG: Normal sinus rhythm   No ST elevation       ECHO 8/18 nml EF with LVH  Thank you for allowing me to participate in the care of your patient.   Note: This dictation was prepared with Dragon dictation along with smaller phrase technology. Any transcriptional errors that result from this process are unintentional.  Time spent: 45 minutes  Robertha Staples, MD   Patient will leave AMA. He has been  cleared by psychiatry. Case discussed with ED physician.

## 2017-08-18 NOTE — ED Notes (Signed)
Pt back from dialysis to discontinue dialysis needles/IV.

## 2017-08-18 NOTE — Discharge Instructions (Signed)
Today you're leaving [place]. We have discussed that your symptoms may worsen, you may have lifelong problems including heart abnormalities, or you may die. You have shown that you understand these risks, and are willing to take them by leaving today.  Return to the emergency department at any time.

## 2017-08-18 NOTE — ED Notes (Signed)
Chaplain notified and at bedside

## 2017-08-18 NOTE — Consult Note (Signed)
[facility] Face-to-Face Psychiatry Consult   Reason for Consult:  Consult for 31 year old man who came to the emergency room with chest pain. Concern was raised about "homicidal ideation" Referring Physician:  [name] Patient Identification: Alan Gross MRN:  [id number] Principal Diagnosis: Adjustment disorder with mixed disturbance of emotions and conduct Diagnosis:   Patient Active Problem List   Diagnosis Date Noted  . Elevated troponin [R74.8] 08/18/2017  . Adjustment disorder with mixed disturbance of emotions and conduct [F43.25] 08/18/2017  . Hypertensive urgency [I16.0] 04/01/2017  . ESRD (end stage renal disease) on dialysis ([place]) [N18.6, Z99.2] 07/19/2016  . Anxiety [F41.9] 07/19/2016  . Heart failure, diastolic, due to HTN ([place]) [I11.0, I50.30] 07/19/2016  . Costochondritis [M94.0] 07/19/2016  . HTN (hypertension) [I10] 07/09/2016    Total Time spent with patient: 1 hour  Subjective:   Alan Gross is a 31 y.o. male patient admitted with "I just had a lot of stress this morning".  HPI:  atient interviewed chart reviewed. Case reviewed with ER physician hospitalist and nursing. 31 year old man who is on chronic hemodialysis. He was brought to the emergency room from his dialysis center today because of complaints about chest discomfort. During the workup he made comments about having thoughts about wanting to "cause somebody pain". Patient tells me thatthis morning he had a major emotional stress. He and his wife, who have been in what sounds like a difficult, on again off again relationship for years, had an argument over the patient discovering that she was still having an affair with another person. He was presented with a bunch of computer information by his wife's allegedly lover. This all happened about 5:00 in the morning. Patient felt very angry and upset. He went to his dialysis session and continued to feel very stressed out. Patient made comments to treatment staff  about howhe had allegedly homicidal thoughts. He asked to speak to the chaplain. He says that after speaking with the chaplain and praying together he feels like his anger has all gone out of him. He is very clear stating to me that he never actually had any intention or plan of doing anything violent or hurting anyone and he never made any statements specifying any person he would hurt. He says that before yesterday his mood has been fine. He does not report any history of recent depression and sleep disturbance psychosisor anger.He is not drinking or actively abusing drugs. Patient articulates multiple positive things in his life to live for including his work and his young daughter who is by another woman and with whom he has a good relationship.Patient is now requesting to leave the emergency room [place] and was placed under commitment based on the purported homicidal thought.  Social history: Patient and his wife had been living together. He states that he intends to now stay either with his mother here in town or with a friend in [place] where his work is located. 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 His reason for wanting to leave [facility] today as he says there is crucial financial business that he has to transact this afternoon.  Medical history: Patient is on hemodialysis. He attributes his dialysis to be high blood pressure which she says was caused by the stress of his marriage.  Substance abuse history: He says that as a younger man he did have problems with alcohol and some drugs but he has given up alcohol entirely. He admits  that he still occasionally uses marijuana. Does not see it as being an active problem.  Past Psychiatric History: atient has anxiety and depression listed as problems in his chart but is not on any medicine for anxiety or depression. He denies ever seeing a mental health professional. Denies ever being prescribed  any psychiatric medicine. No history of inpatient treatment. Never been on any "nerve pills". Denies any history of suicide attempt. He said when he was a much younger person he got into fights but at low the last 15-20 years he has never been violent. He denies any history of violence to women.  Risk to Self: Is patient at risk for suicide?: No Risk to Others:   Prior Inpatient Therapy:   Prior Outpatient Therapy:    Past Medical History:  Past Medical History:  Diagnosis Date  . Anxiety   . Depression   . Dyspnea   . H/O cardiac catheterization   . History of febrile seizure   . Hypertension   . Irregular heart beats   . Kidney failure    M,W,F [facility] in [place]; 15 %   . Stroke (cerebrum) ([place])    no residual effects    Past Surgical History:  Procedure Laterality Date  . A/V FISTULAGRAM N/A 05/07/2017   Procedure: A/V Fistulagram;  Surgeon: Algernon Huxley, MD;  Location: [facility];  Service: Cardiovascular;  Laterality: N/A;  . AV FISTULA PLACEMENT Left 09/10/2016   Procedure: LEFT ARTERIOVENOUS (AV) FISTULA CREATION;  Surgeon: Conrad Cimarron, MD;  Location: [facility];  Service: Vascular;  Laterality: Left;  . CARDIAC CATHETERIZATION Left 05/02/2015   Procedure: Left Heart Cath and Coronary Angiography;  Surgeon: Dionisio David, MD;  Location: [place] [facility];  Service: Cardiovascular;  Laterality: Left;  . CORONARY ANGIOPLASTY    . PERIPHERAL VASCULAR CATHETERIZATION N/A 07/11/2016   Procedure: Dialysis/Perma Catheter Insertion;  Surgeon: Algernon Huxley, MD;  Location: [facility];  Service: Cardiovascular;  Laterality: N/A;  . PERIPHERAL VASCULAR THROMBECTOMY Left 05/07/2017   Procedure: Peripheral Vascular Thrombectomy;  Surgeon: Algernon Huxley, MD;  Location: [place];  Service: Cardiovascular;  Laterality: Left;   Family History:  Family History  Problem Relation Age of Onset  . Hypertension Other   . Cancer Father        liver  . Hypertension  Brother   . Diabetes Paternal Aunt   . Diabetes Paternal Uncle    Family Psychiatric  History: he denies there being any family history of mental health or substance abuse problems Social History:  History  Alcohol Use No     History  Drug Use  . Types: Marijuana    Social History   Social History  . Marital status: Legally Separated    Spouse name: N/A  . Number of children: N/A  . Years of education: N/A   Occupational History  . 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 Smoking status: Former Smoker    Packs/day: 0.25    Years: 10.00    Types: Cigarettes    Quit date: 06/22/2016  . Smokeless tobacco: Never Used     Comment: smokes 1-2 cigarettes daily  . Alcohol use No  . Drug use: Yes    Types: Marijuana  . Sexual activity: Not Asked   Other Topics Concern  . None   Social History Narrative  . None   Additional Social History:    Allergies:  Allergies  Allergen Reactions  . Lasix [Furosemide] Shortness Of Breath, Swelling and Anxiety    Labs:  Results for orders placed or performed during the hospital encounter of 08/18/17 (from the past 48 hour(s))  Basic metabolic panel     Status: Abnormal   Collection Time: 08/18/17 12:02 PM  Result Value Ref Range   Sodium 138 135 - 145 mmol/L   Potassium 3.2 (L) 3.5 - 5.1 mmol/L   Chloride 99 (L) 101 - 111 mmol/L   CO2 28 22 - 32 mmol/L   Glucose, Bld 104 (H) 65 - 99 mg/dL   BUN 27 (H) 6 - 20 mg/dL   Creatinine, Ser 4.27 (H) 0.61 - 1.24 mg/dL   Calcium 9.2 8.9 - 10.3 mg/dL   GFR calc non Af Amer 17 (L) >60 mL/min   GFR calc Af Amer 20 (L) >60 mL/min    Comment: (NOTE) The eGFR has been calculated using the CKD EPI equation. This calculation has not been validated in all clinical situations. eGFR's persistently <60 mL/min signify possible Chronic Kidney Disease.    Anion gap 11 5 - 15  CBC     Status: Abnormal   Collection Time: 08/18/17 12:02 PM  Result Value Ref Range   WBC 10.2  3.8 - 10.6 K/uL   RBC 4.93 4.40 - 5.90 MIL/uL   Hemoglobin 13.7 13.0 - 18.0 g/dL   HCT 40.7 40.0 - 52.0 %   MCV 82.7 80.0 - 100.0 fL   MCH 27.8 26.0 - 34.0 pg   MCHC 33.6 32.0 - 36.0 g/dL   RDW 18.0 (H) 11.5 - 14.5 %   Platelets 183 150 - 440 K/uL  Troponin I     Status: Abnormal   Collection Time: 08/18/17 12:02 PM  Result Value Ref Range   Troponin I 0.07 (HH) <0.03 ng/mL    Comment: CRITICAL RESULT CALLED TO, READ BACK BY AND VERIFIED WITH SHANNIN HATCH @ 1250 08/18/17 [place]   Brain natriuretic peptide     Status: Abnormal   Collection Time: 08/18/17 12:02 PM  Result Value Ref Range   B Natriuretic Peptide 1,545.0 (H) 0.0 - 100.0 pg/mL    Current Facility-Administered Medications  Medication Dose Route Frequency Provider Last Rate Last Dose  . amLODipine (NORVASC) tablet 10 mg  10 mg Oral Daily Mody, Sital, MD      . carvedilol (COREG) tablet 25 mg  25 mg Oral BID WC Mody, Sital, MD      . hydrALAZINE (APRESOLINE) injection 10 mg  10 mg Intravenous Q6H PRN Mody, Sital, MD      . nitroGLYCERIN (NITROGLYN) 2 % ointment 0.5 inch  0.5 inch Topical Q6H Mody, Sital, MD      . nitroGLYCERIN (NITROSTAT) SL tablet 0.4 mg  0.4 mg Sublingual Q5 min PRN Eula Listen, MD   0.4 mg at 08/18/17 1240   Current Outpatient Prescriptions  Medication Sig Dispense Refill  . amLODipine (NORVASC) 10 MG tablet Take 1 tablet (10 mg total) by mouth every evening. 30 tablet 1  . carvedilol (COREG) 25 MG tablet Take 1 tablet (25 mg total) by mouth 2 (two) times daily with a meal. 60 tablet 1  . losartan (COZAAR) 100 MG tablet Take 1 tablet (100 mg total) by mouth every evening. 30 tablet 1  . aspirin 81 MG tablet Take 1 tablet (81 mg total) by mouth daily. (Patient not taking: Reported on 05/07/2017) 30 tablet   . oxyCODONE-acetaminophen (ROXICET) 5-325 MG tablet Take 1 tablet  by mouth every 6 (six) hours as needed for severe pain. (Patient not taking: Reported on 05/07/2017) 8 tablet 0     Musculoskeletal: Strength & Muscle Tone: within normal limits Gait & Station: normal Patient leans: N/A  Psychiatric Specialty Exam: Physical Exam  Nursing note and vitals reviewed. Constitutional: He appears well-developed and well-nourished.  HENT:  Head: Normocephalic and atraumatic.  Eyes: Pupils are equal, round, and reactive to light. Conjunctivae are normal.  Neck: Normal range of motion.  Cardiovascular: Regular rhythm and normal heart sounds.   Respiratory: Effort normal. No respiratory distress.  GI: Soft.  Musculoskeletal: Normal range of motion.  Neurological: He is alert.  Skin: Skin is warm and dry.  Psychiatric: He has a normal mood and affect. His speech is normal and behavior is normal. Judgment and thought content normal. Cognition and memory are normal.    Review of Systems  Constitutional: Negative.   HENT: Negative.   Eyes: Negative.   Respiratory: Negative.   Cardiovascular: Positive for chest pain.  Gastrointestinal: Negative.   Musculoskeletal: Negative.   Skin: Negative.   Neurological: Negative.   Psychiatric/Behavioral: Negative for depression, hallucinations, memory loss, substance abuse and suicidal ideas. The patient is nervous/anxious. The patient does not have insomnia.     Blood pressure (!) 172/112, pulse 66, temperature 97.8 F (36.6 C), temperature source Oral, resp. rate 20, height '6\' 1"'$  (1.854 m), weight 117.9 kg (260 lb), SpO2 100 %.Body mass index is 34.3 kg/m.  General Appearance: Fairly Groomed  Eye Contact:  Good  Speech:  Clear and Coherent  Volume:  Normal  Mood:  Anxious  Affect:  Congruent  Thought Process:  Goal Directed  Orientation:  Full (Time, Place, and Person)  Thought Content:  Logical  Suicidal Thoughts:  No  Homicidal Thoughts:  No  Memory:  Immediate;   Good Recent;   Good Remote;   Good  Judgement:  Fair  Insight:  Fair  Psychomotor Activity:  Normal  Concentration:  Concentration: Fair  Recall:   [employer] of Knowledge:  Fair  Language:  Fair  Akathisia:  No  Handed:  Right  AIMS (if indicated):     Assets:  Communication Skills Housing Resilience Social Support  ADL's:  Intact  Cognition:  WNL  Sleep:        Treatment Plan Summary: Plan 31 year old man who convincingly describes a acute emotional stressor today which caused him to be upset and make some comments today. He has not displayed any actual violent behavior. He has coped with his problem appropriately so far. During my interview I did not find any evidence of major depression, mania, psychosis or other acutely treatable mental health problem other than adjustment disorder symptoms. Patient is clear that he has no intention of doing anything violent. He has a clear understanding of the consequences of this including the catastrophic consequences to his own life. Patient does not appear to be psychotic. Says that he does not own a firearm. He is appropriate and not rageful or out of control during the interview. I do not think he meets commitment criteria or represents either a mental illness or an increased risk of dangerousness. Supportive counseling with the patient and encouragement to consider seeking therapy if mood problems continue. I'm going to discontinue the involuntary commitment. Case reviewed with emergency room physician and hospitalist. Patient has the capacity to make decisions about his care including his stated intention to leave the emergency room [place]. 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 No further treatment no prescriptions.  Disposition: No evidence of imminent risk to self or others at present.   Patient does not meet criteria for psychiatric inpatient admission.  Alethia Berthold, MD 08/18/2017 2:30 PM

## 2017-08-18 NOTE — Progress Notes (Signed)
[place] responded to a PG from ED concerning Pt in ED-11. Pt presented very anxious and stated he had very bad thoughts in his head. Pt stated that his marriage ended earlier today and that he feels this is the reason he is in the ED. Pt continually asked about his blood pressure and heart rate, both of which seemed elevated. [place] spent time with Pt hearing his story and presenting calm. CH provided prayer. [place] informed nurse of the Pt's anxiety and his "very bad thoughts." [place] feels that there are deeper issues that perhaps should be addressed through the behavioral health unit.    08/18/17 1300  Clinical Encounter Type  Visited With Patient;Health care provider  Visit Type Initial;Spiritual support;ED  Referral From Nurse  Consult/Referral To Chaplain;Social work  Spiritual Encounters  Spiritual Needs Prayer;Emotional

## 2017-08-18 NOTE — ED Triage Notes (Addendum)
Pt to ED via EMS from dialysis center, pt was being dialyzed aprox an hour into treatment when he started having CP. Pt had aprox 734mL taken off during treatment. Pt is HTN at 208/125. Pt rates pain 2/10. Per EMS pt states increased stress this week.

## 2017-08-18 NOTE — BH Assessment (Signed)
Writer unable to complete consult. Patient seen by Psych MD and will be discharging when medically cleared.

## 2017-08-19 ENCOUNTER — Encounter (INDEPENDENT_AMBULATORY_CARE_PROVIDER_SITE_OTHER): Payer: Self-pay | Admitting: Vascular Surgery

## 2017-08-19 ENCOUNTER — Encounter (INDEPENDENT_AMBULATORY_CARE_PROVIDER_SITE_OTHER): Payer: 59

## 2017-09-03 ENCOUNTER — Encounter: Payer: Self-pay | Admitting: Emergency Medicine

## 2017-09-03 ENCOUNTER — Emergency Department: Payer: No Typology Code available for payment source

## 2017-09-03 ENCOUNTER — Observation Stay
Admission: EM | Admit: 2017-09-03 | Discharge: 2017-09-04 | Disposition: A | Discharge status: Home / Self Care | Payer: No Typology Code available for payment source | Attending: Internal Medicine | Admitting: Internal Medicine

## 2017-09-03 DIAGNOSIS — F17210 Nicotine dependence, cigarettes, uncomplicated: Secondary | ICD-10-CM | POA: Diagnosis not present

## 2017-09-03 DIAGNOSIS — Z9114 Patient's other noncompliance with medication regimen: Secondary | ICD-10-CM | POA: Insufficient documentation

## 2017-09-03 DIAGNOSIS — Z8673 Personal history of transient ischemic attack (TIA), and cerebral infarction without residual deficits: Secondary | ICD-10-CM | POA: Diagnosis not present

## 2017-09-03 DIAGNOSIS — Z7982 Long term (current) use of aspirin: Secondary | ICD-10-CM | POA: Diagnosis not present

## 2017-09-03 DIAGNOSIS — I110 Hypertensive heart disease with heart failure: Secondary | ICD-10-CM

## 2017-09-03 DIAGNOSIS — I160 Hypertensive urgency: Secondary | ICD-10-CM | POA: Diagnosis present

## 2017-09-03 DIAGNOSIS — F419 Anxiety disorder, unspecified: Secondary | ICD-10-CM | POA: Insufficient documentation

## 2017-09-03 DIAGNOSIS — Z992 Dependence on renal dialysis: Secondary | ICD-10-CM | POA: Diagnosis not present

## 2017-09-03 DIAGNOSIS — N186 End stage renal disease: Secondary | ICD-10-CM

## 2017-09-03 DIAGNOSIS — R9439 Abnormal result of other cardiovascular function study: Secondary | ICD-10-CM | POA: Diagnosis not present

## 2017-09-03 DIAGNOSIS — R079 Chest pain, unspecified: Secondary | ICD-10-CM

## 2017-09-03 DIAGNOSIS — R008 Other abnormalities of heart beat: Secondary | ICD-10-CM | POA: Insufficient documentation

## 2017-09-03 DIAGNOSIS — I1311 Hypertensive heart and chronic kidney disease without heart failure, with stage 5 chronic kidney disease, or end stage renal disease: Secondary | ICD-10-CM | POA: Diagnosis not present

## 2017-09-03 DIAGNOSIS — F4325 Adjustment disorder with mixed disturbance of emotions and conduct: Secondary | ICD-10-CM | POA: Insufficient documentation

## 2017-09-03 DIAGNOSIS — E876 Hypokalemia: Secondary | ICD-10-CM | POA: Insufficient documentation

## 2017-09-03 DIAGNOSIS — I5030 Unspecified diastolic (congestive) heart failure: Secondary | ICD-10-CM

## 2017-09-03 DIAGNOSIS — I428 Other cardiomyopathies: Secondary | ICD-10-CM | POA: Diagnosis not present

## 2017-09-03 HISTORY — DX: End stage renal disease: N18.6

## 2017-09-03 LAB — CBC WITH DIFFERENTIAL/PLATELET
BASOS ABS: 0.1 10*3/uL (ref 0–0.1)
Basophils Relative: 1 %
EOS ABS: 0.2 10*3/uL (ref 0–0.7)
EOS PCT: 2 %
HEMATOCRIT: 40.4 % (ref 40.0–52.0)
Hemoglobin: 13.4 g/dL (ref 13.0–18.0)
LYMPHS PCT: 18 %
Lymphs Abs: 1.4 10*3/uL (ref 1.0–3.6)
MCH: 27.8 pg (ref 26.0–34.0)
MCHC: 33.3 g/dL (ref 32.0–36.0)
MCV: 83.7 fL (ref 80.0–100.0)
Monocytes Absolute: 0.8 10*3/uL (ref 0.2–1.0)
Monocytes Relative: 10 %
NEUTROS PCT: 69 %
Neutro Abs: 5.6 10*3/uL (ref 1.4–6.5)
Platelets: 163 10*3/uL (ref 150–440)
RBC: 4.83 MIL/uL (ref 4.40–5.90)
RDW: 17.5 % — AB (ref 11.5–14.5)
WBC: 8.1 10*3/uL (ref 3.8–10.6)

## 2017-09-03 LAB — BASIC METABOLIC PANEL
Anion gap: 12 (ref 5–15)
BUN: 12 mg/dL (ref 6–20)
CALCIUM: 9.1 mg/dL (ref 8.9–10.3)
CHLORIDE: 94 mmol/L — AB (ref 101–111)
CO2: 28 mmol/L (ref 22–32)
CREATININE: 3.11 mg/dL — AB (ref 0.61–1.24)
GFR calc non Af Amer: 25 mL/min — ABNORMAL LOW (ref >60)
GFR, EST AFRICAN AMERICAN: 29 mL/min — AB (ref >60)
GLUCOSE: 105 mg/dL — AB (ref 65–99)
Potassium: 3 mmol/L — ABNORMAL LOW (ref 3.5–5.1)
Sodium: 134 mmol/L — ABNORMAL LOW (ref 135–145)

## 2017-09-03 LAB — TROPONIN I
Troponin I: 0.09 ng/mL (ref <0.03)
Troponin I: 0.09 ng/mL (ref <0.03)

## 2017-09-03 LAB — MRSA PCR SCREENING: MRSA by PCR: NEGATIVE

## 2017-09-03 MED ORDER — ONDANSETRON HCL 4 MG PO TABS: 4.0000 mg | ORAL_TABLET | Freq: Four times a day (QID) | ORAL | Status: DC | PRN

## 2017-09-03 MED ORDER — CARVEDILOL 25 MG PO TABS
25.0000 mg | ORAL_TABLET | Freq: Two times a day (BID) | ORAL | Status: DC
  Administered 2017-09-03: 25 mg via ORAL
  Filled 2017-09-03: qty 1

## 2017-09-03 MED ORDER — ASPIRIN 81 MG PO CHEW
324.0000 mg | CHEWABLE_TABLET | Freq: Once | ORAL | Status: AC
  Administered 2017-09-03: 324 mg via ORAL
  Filled 2017-09-03: qty 4

## 2017-09-03 MED ORDER — AMLODIPINE BESYLATE 10 MG PO TABS
10.0000 mg | ORAL_TABLET | Freq: Every evening | ORAL | Status: DC
  Administered 2017-09-03 – 2017-09-04 (×2): 10 mg via ORAL
  Filled 2017-09-03 (×2): qty 1

## 2017-09-03 MED ORDER — CLONIDINE HCL 0.1 MG PO TABS
0.2000 mg | ORAL_TABLET | Freq: Three times a day (TID) | ORAL | Status: DC
  Administered 2017-09-03 – 2017-09-04 (×2): 0.2 mg via ORAL
  Filled 2017-09-03 (×2): qty 2

## 2017-09-03 MED ORDER — HEPARIN SODIUM (PORCINE) 5000 UNIT/ML IJ SOLN
5000.0000 [IU] | Freq: Three times a day (TID) | INTRAMUSCULAR | Status: DC
  Administered 2017-09-03 – 2017-09-04 (×3): 5000 [IU] via SUBCUTANEOUS
  Filled 2017-09-03 (×3): qty 1

## 2017-09-03 MED ORDER — LABETALOL HCL 5 MG/ML IV SOLN
5.0000 mg | Freq: Once | INTRAVENOUS | Status: AC
  Administered 2017-09-03: 5 mg via INTRAVENOUS
  Filled 2017-09-03: qty 4

## 2017-09-03 MED ORDER — LOSARTAN POTASSIUM 50 MG PO TABS
100.0000 mg | ORAL_TABLET | Freq: Every evening | ORAL | Status: DC
  Administered 2017-09-03 – 2017-09-04 (×2): 100 mg via ORAL
  Filled 2017-09-03 (×2): qty 2

## 2017-09-03 MED ORDER — ONDANSETRON HCL 4 MG/2ML IJ SOLN: 4.0000 mg | Freq: Four times a day (QID) | INTRAMUSCULAR | Status: DC | PRN

## 2017-09-03 MED ORDER — HYDRALAZINE HCL 20 MG/ML IJ SOLN
10.0000 mg | Freq: Four times a day (QID) | INTRAMUSCULAR | Status: DC | PRN
  Administered 2017-09-03: 10 mg via INTRAVENOUS
  Filled 2017-09-03: qty 1

## 2017-09-03 MED ORDER — NITROGLYCERIN 2 % TD OINT
0.5000 [in_us] | TOPICAL_OINTMENT | Freq: Once | TRANSDERMAL | Status: AC
  Administered 2017-09-03: 0.5 [in_us] via TOPICAL
  Filled 2017-09-03: qty 1

## 2017-09-03 MED ORDER — CARVEDILOL 25 MG PO TABS
25.0000 mg | ORAL_TABLET | Freq: Two times a day (BID) | ORAL | Status: DC
  Administered 2017-09-04: 25 mg via ORAL
  Filled 2017-09-03: qty 1

## 2017-09-03 MED ORDER — ACETAMINOPHEN 325 MG PO TABS
650.0000 mg | ORAL_TABLET | Freq: Four times a day (QID) | ORAL | Status: DC | PRN
  Administered 2017-09-03: 650 mg via ORAL
  Filled 2017-09-03: qty 2

## 2017-09-03 MED ORDER — POTASSIUM CHLORIDE CRYS ER 20 MEQ PO TBCR
40.0000 meq | EXTENDED_RELEASE_TABLET | Freq: Once | ORAL | Status: AC
Start: 2017-09-03 — End: 2017-09-03
  Administered 2017-09-03: 40 meq via ORAL
  Filled 2017-09-03: qty 2

## 2017-09-03 MED ORDER — NITROGLYCERIN 2 % TD OINT
0.5000 [in_us] | TOPICAL_OINTMENT | Freq: Four times a day (QID) | TRANSDERMAL | Status: DC
  Administered 2017-09-04 (×2): 0.5 [in_us] via TOPICAL
  Filled 2017-09-03 (×3): qty 1

## 2017-09-03 MED ORDER — NITROGLYCERIN 0.4 MG SL SUBL
0.4000 mg | SUBLINGUAL_TABLET | SUBLINGUAL | Status: DC | PRN
  Administered 2017-09-03: 0.4 mg via SUBLINGUAL
  Filled 2017-09-03: qty 1

## 2017-09-03 MED ORDER — ASPIRIN 81 MG PO CHEW
81.0000 mg | CHEWABLE_TABLET | Freq: Every day | ORAL | Status: DC
  Administered 2017-09-04: 81 mg via ORAL
  Filled 2017-09-03: qty 1

## 2017-09-03 MED ORDER — LORAZEPAM 1 MG PO TABS
1.0000 mg | ORAL_TABLET | Freq: Once | ORAL | Status: AC
  Administered 2017-09-03: 1 mg via ORAL
  Filled 2017-09-03: qty 1

## 2017-09-03 MED ORDER — ACETAMINOPHEN 650 MG RE SUPP: 650.0000 mg | Freq: Four times a day (QID) | RECTAL | Status: DC | PRN

## 2017-09-03 NOTE — ED Triage Notes (Signed)
Pt in via [facility] from home; pt had dialysis today, unable to finish treatment due to onset of chest pain.  Pt reports chest pain is intermittent x 3-4 months, reports left arm numbness, dizziness with pain.  Pt hypertensive upon arrival 217/151, states, "that is good for me."  Pt ambulatory to room, other vitals WDL, NAD noted at this time.

## 2017-09-03 NOTE — ED Notes (Signed)
Pain to palpation on left chest. Pt doesn't do any exercising or heavy lifting.

## 2017-09-03 NOTE — ED Notes (Signed)
Pt states he did not take his BP medications today due to dialysis. States his BP drops during dialysis so he takes it afterwards

## 2017-09-03 NOTE — ED Provider Notes (Signed)
[facility] Emergency Department Provider Note    First MD Initiated Contact with Patient 09/03/17 1705     (approximate)  I have reviewed the triage vital signs and the nursing notes.   HISTORY  Chief Complaint Chest Pain    HPI Alan Gross is a 31 y.o. male resents with chief complaint of chest pain that started during dialysis. Patient has a history of chest pain and extensive chronic medical conditions including poorly controlled hypertension now on dialysis as well as LVH and persistent troponin elevation.. States that he was otherwise feeling well today, held his blood pressure medication and then went to dialysis where he started having chest tightness and pressure radiating to his left arm. States his pain is worse when palpating his left chest wall. Denies any fevers. No cough.   Past Medical History:  Diagnosis Date  . Anxiety   . Depression   . Dyspnea   . ESRD (end stage renal disease) ([place])   . H/O cardiac catheterization   . History of febrile seizure   . Hypertension   . Irregular heart beats   . Kidney failure    M,W,F [facility] in [place]; 15 %   . 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 Hypertension Other   . Cancer Father        liver  . Hypertension Brother   . Diabetes Paternal Aunt   . Diabetes Paternal Uncle    Past Surgical History:  Procedure Laterality Date  . A/V FISTULAGRAM N/A 05/07/2017   Procedure: A/V Fistulagram;  Surgeon: Algernon Huxley, MD;  Location: [place] [facility];  Service: Cardiovascular;  Laterality: N/A;  . AV FISTULA PLACEMENT Left 09/10/2016   Procedure: LEFT ARTERIOVENOUS (AV) FISTULA CREATION;  Surgeon: Conrad Nobles, MD;  Location: [place];  Service: Vascular;  Laterality: Left;  . CARDIAC CATHETERIZATION Left 05/02/2015   Procedure: Left Heart Cath and Coronary Angiography;  Surgeon: Dionisio David, MD;  Location: [place] [facility];   Service: Cardiovascular;  Laterality: Left;  . CORONARY ANGIOPLASTY    . PERIPHERAL VASCULAR CATHETERIZATION N/A 07/11/2016   Procedure: Dialysis/Perma Catheter Insertion;  Surgeon: Algernon Huxley, MD;  Location: [place] [facility];  Service: Cardiovascular;  Laterality: N/A;  . PERIPHERAL VASCULAR THROMBECTOMY Left 05/07/2017   Procedure: Peripheral Vascular Thrombectomy;  Surgeon: Algernon Huxley, MD;  Location: [place] [facility];  Service: Cardiovascular;  Laterality: Left;   Patient Active Problem List   Diagnosis Date Noted  . Chest pain 09/03/2017  . Elevated troponin 08/18/2017  . 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 Hypertensive urgency 04/01/2017  . ESRD (end stage renal disease) on dialysis ([place]) 07/19/2016  . Anxiety 07/19/2016  . Heart failure, diastolic, due to HTN ([place]) 07/19/2016  . Costochondritis 07/19/2016  . HTN (hypertension) 07/09/2016      Prior to Admission medications   Medication Sig Start Date End Date Taking? Authorizing Provider  amLODipine (NORVASC) 10 MG tablet Take 1 tablet (10 mg total) by mouth every evening. 07/10/17  Yes Awilda Bill, NP  carvedilol (COREG) 25 MG tablet Take 1 tablet (25 mg total) by mouth 2 (two) times daily with a meal. 07/10/17  Yes Awilda Bill, NP  losartan (COZAAR) 100 MG tablet Take 1 tablet (100 mg total) by mouth every evening. 07/10/17  Yes Awilda Bill, NP  aspirin 81 MG tablet Take 1 tablet (81 mg total) by mouth daily. Patient not taking: Reported on 05/07/2017 07/19/16   [name], DO  cloNIDine (CATAPRES) 0.2 MG tablet Take 0.2 mg by mouth 3 (three) times daily. 03/12/17   [provider]  oxyCODONE-acetaminophen (ROXICET) 5-325 MG tablet Take 1 tablet by mouth every 6 (six) hours as needed for severe pain. Patient not taking: Reported on 05/07/2017 09/10/16   Gabriel Earing, PA-C    Allergies Lasix [furosemide]    Social History Social  History  Substance Use Topics  . Smoking status: Current Every Day Smoker    Packs/day: 0.25    Years: 10.00    Types: Cigarettes    Last attempt to quit: 06/22/2016  . Smokeless tobacco: Never Used  . Alcohol use No    Review of Systems Patient denies headaches, rhinorrhea, blurry vision, numbness, shortness of breath, chest pain, edema, cough, abdominal pain, nausea, vomiting, diarrhea, dysuria, fevers, rashes or hallucinations unless otherwise stated above in HPI. ____________________________________________   PHYSICAL EXAM:  VITAL SIGNS: Vitals:   09/03/17 1845 09/03/17 2025  BP: (!) 198/140 (!) 194/131  Pulse: 80   Resp: (!) 22   Temp:    SpO2: 97%     Constitutional: Alert and oriented.  Ambulated into the ER in no distress. Well appearing and in no acute distress. Eyes: Conjunctivae are normal.  Head: Atraumatic. Nose: No congestion/rhinnorhea. Mouth/Throat: Mucous membranes are moist.   Neck: No stridor. Painless ROM.  Cardiovascular: Normal rate, regular rhythm. Grossly normal heart sounds.  Good peripheral circulation. Respiratory: Normal respiratory effort.  No retractions. Lungs CTAB. Gastrointestinal: Soft and nontender. No distention. No abdominal bruits. No CVA tenderness. Genitourinary:  Musculoskeletal: No lower extremity tenderness nor edema.  No joint effusions. Neurologic:  Normal speech and language. No gross focal neurologic deficits are appreciated. No facial droop Skin:  Skin is warm, dry and intact. No rash noted. Psychiatric: Mood and affect are normal. Speech and behavior are normal.  ____________________________________________   LABS (all labs ordered are listed, but only abnormal results are displayed)  Results for orders placed or performed during the hospital encounter of 09/03/17 (from the past 24 hour(s))  CBC with Differential/Platelet     Status: Abnormal   Collection Time: 09/03/17  5:13 PM  Result Value Ref Range   WBC 8.1 3.8 -  10.6 K/uL   RBC 4.83 4.40 - 5.90 MIL/uL   Hemoglobin 13.4 13.0 - 18.0 g/dL   HCT 40.4 40.0 - 52.0 %   MCV 83.7 80.0 - 100.0 fL   MCH 27.8 26.0 - 34.0 pg   MCHC 33.3 32.0 - 36.0 g/dL   RDW 17.5 (H) 11.5 - 14.5 %   Platelets 163 150 - 440 K/uL   Neutrophils Relative % 69 %   Neutro Abs 5.6 1.4 - 6.5 K/uL   Lymphocytes Relative 18 %   Lymphs Abs 1.4 1.0 - 3.6 K/uL   Monocytes Relative 10 %   Monocytes Absolute 0.8 0.2 - 1.0 K/uL   Eosinophils Relative 2 %   Eosinophils Absolute 0.2 0 - 0.7 K/uL   Basophils Relative 1 %   Basophils Absolute 0.1 0 - 0.1 K/uL  Basic metabolic panel     Status: Abnormal   Collection Time: 09/03/17  5:13 PM  Result Value Ref Range   Sodium 134 (L) 135 - 145 mmol/L   Potassium 3.0 (L) 3.5 - 5.1 mmol/L   Chloride 94 (L) 101 - 111  mmol/L   CO2 28 22 - 32 mmol/L   Glucose, Bld 105 (H) 65 - 99 mg/dL   BUN 12 6 - 20 mg/dL   Creatinine, Ser 3.11 (H) 0.61 - 1.24 mg/dL   Calcium 9.1 8.9 - 10.3 mg/dL   GFR calc non Af Amer 25 (L) >60 mL/min   GFR calc Af Amer 29 (L) >60 mL/min   Anion gap 12 5 - 15  Troponin I     Status: Abnormal   Collection Time: 09/03/17  5:13 PM  Result Value Ref Range   Troponin I 0.09 (HH) <0.03 ng/mL   ____________________________________________  EKG My review and personal interpretation at Time: 17:08   Indication: chest pain  Rate: 80  Rhythm: sinus Axis: normal Other: BER with non specific st changes without significant change of morphology as compared to previous ____________________________________________  RADIOLOGY  I personally reviewed all radiographic images ordered to evaluate for the above acute complaints and reviewed radiology reports and findings.  These findings were personally discussed with the patient.  Please see medical record for radiology report.  ____________________________________________   PROCEDURES  Procedure(s) performed:  Procedures    Critical Care performed:  no ____________________________________________   INITIAL IMPRESSION / ASSESSMENT AND PLAN / ED COURSE  Pertinent labs & imaging results that were available during my care of the patient were reviewed by me and considered in my medical decision making (see chart for details).  DDX: ACS, pericarditis, esophagitis, boerhaaves, pe, dissection, pna, bronchitis, costochondritis   Alan Gross is a 31 y.o. who presents to the ED with chest pain as described above patient also markedly hypertensive. Patient is a known vasculopath already on end-stage renal disease with dialysis. Of EKG shows nonspecific changes not significantly changed from previous. I do suspect large component of his symptoms are secondary to hypertensive urgency therefore we'll treat with nitroglycerin. Blood work will be sent for the above differential. Do not believe is consistent with pulmonary and was more dissection. His abdominal exam is soft and benign.  Clinical Course as of Sep 04 2027  Wed Sep 03, 2017  1810 Patient persistently elevated blood pressure will give IV labetalol and his oral carvedilol.  [PR]  1901 discussed case with patient he still having persistent discomfort. Has not had any provocative testing done since 2016. Based on his risk factors I have recommended admission for further risk stratification. We'll touch base with hospitalist.  [PR]    Clinical Course User Index [PR] Merlyn Lot, MD     ____________________________________________   FINAL CLINICAL IMPRESSION(S) / ED DIAGNOSES  Final diagnoses:  Hypertensive urgency  Chest pain, unspecified type  ESRD (end stage renal disease) on dialysis [facility])      NEW MEDICATIONS STARTED DURING THIS VISIT:  New Prescriptions   No medications on file     Note:  This document was prepared using Dragon voice recognition software and may include unintentional dictation errors.    Merlyn Lot, MD 09/03/17 2028

## 2017-09-03 NOTE — ED Notes (Signed)
Pt transported to room 257 

## 2017-09-03 NOTE — H&P (Signed)
[place] [facility] [place] NAME: Alan Gross    MR#:  [id number]  DATE OF BIRTH:  09/20/1986  DATE OF ADMISSION:  09/03/2017  PRIMARY CARE PHYSICIAN: Patient, No Pcp Per   REQUESTING/REFERRING PHYSICIAN: Dr. Merlyn Lot  CHIEF COMPLAINT:   Chief Complaint  Patient presents with  . Chest Pain    HISTORY OF PRESENT ILLNESS:  Alan Gross  is a 31 y.o. male with a known history of uncontrolled hypertension, end-stage renal disease on hemodialysis Monday, Wednesday and Friday, depression and anxiety presents to hospital after dialysis today secondary to chest pain. Patient states he's been having the chest pain under his left arm and left chest for almost 2 days now. Denies lifting any heavy stuff. Denies any cold, cough or congestion. No fevers or chills. Not associated with any nausea or diaphoresis. Pain got worse during dialysis today and so was sent over to the emergency room. EKG shows LVH changes. Patient's blood pressure is significantly elevated with systolic pressure greater than 200. He hasn't started taking all his blood pressure medications yet. Prior cardiac catheterization in 2016 shows normal coronaries. His first troponin is slightly elevated at 0.09. So being admitted for the same.  PAST MEDICAL HISTORY:   Past Medical History:  Diagnosis Date  . Anxiety   . Depression   . Dyspnea   . ESRD (end stage renal disease) ([place])   . H/O cardiac catheterization   . History of febrile seizure   . Hypertension   . Irregular heart beats   . Kidney failure    M,W,F [facility] in [place]; 15 %   . 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 A/V FISTULAGRAM N/A 05/07/2017   Procedure: A/V Fistulagram;  Surgeon: Algernon Huxley, MD;  Location: [place] [facility];  Service: Cardiovascular;  Laterality: N/A;  . AV FISTULA PLACEMENT Left 09/10/2016     Procedure: LEFT ARTERIOVENOUS (AV) FISTULA CREATION;  Surgeon: Conrad Greenwood, MD;  Location: [place];  Service: Vascular;  Laterality: Left;  . CARDIAC CATHETERIZATION Left 05/02/2015   Procedure: Left Heart Cath and Coronary Angiography;  Surgeon: Dionisio David, MD;  Location: [place] [facility];  Service: Cardiovascular;  Laterality: Left;  . CORONARY ANGIOPLASTY    . PERIPHERAL VASCULAR CATHETERIZATION N/A 07/11/2016   Procedure: Dialysis/Perma Catheter Insertion;  Surgeon: Algernon Huxley, MD;  Location: [place] [facility];  Service: Cardiovascular;  Laterality: N/A;  . PERIPHERAL VASCULAR THROMBECTOMY Left 05/07/2017   Procedure: Peripheral Vascular Thrombectomy;  Surgeon: Algernon Huxley, MD;  Location: [place] [facility];  Service: Cardiovascular;  Laterality: Left;    SOCIAL HISTORY:   Social History  Substance Use Topics  . Smoking status: Current Every Day Smoker    Packs/day: 0.25    Years: 10.00    Types: Cigarettes    Last attempt to quit: 06/22/2016  . Smokeless tobacco: Never Used  . Alcohol use No    FAMILY HISTORY:   Family History  Problem Relation Age of Onset  . Hypertension Other   . Cancer Father        liver  . Hypertension Brother   . Diabetes Paternal Aunt   . Diabetes Paternal Uncle     DRUG ALLERGIES:   Allergies  Allergen Reactions  . Lasix [Furosemide] Shortness Of Breath, Swelling and Anxiety  REVIEW OF SYSTEMS:   Review of Systems  Constitutional: Negative for chills, fever, malaise/fatigue and weight loss.  HENT: Negative for ear discharge, ear pain, hearing loss, nosebleeds and tinnitus.   Eyes: Negative for blurred vision, double vision and photophobia.  Respiratory: Negative for cough, hemoptysis, shortness of breath and wheezing.   Cardiovascular: Positive for chest pain. Negative for palpitations, orthopnea and leg swelling.  Gastrointestinal: Negative for abdominal pain, constipation, diarrhea, heartburn, melena, nausea and  vomiting.  Genitourinary: Negative for dysuria, frequency, hematuria and urgency.  Musculoskeletal: Negative for back pain, myalgias and neck pain.  Skin: Negative for rash.  Neurological: Negative for dizziness, tingling, tremors, sensory change, speech change, focal weakness and headaches.  Endo/Heme/Allergies: Does not bruise/bleed easily.  Psychiatric/Behavioral: Negative for depression.    MEDICATIONS AT HOME:   Prior to Admission medications   Medication Sig Start Date End Date Taking? Authorizing Provider  amLODipine (NORVASC) 10 MG tablet Take 1 tablet (10 mg total) by mouth every evening. 07/10/17  Yes Awilda Bill, NP  carvedilol (COREG) 25 MG tablet Take 1 tablet (25 mg total) by mouth 2 (two) times daily with a meal. 07/10/17  Yes Awilda Bill, NP  losartan (COZAAR) 100 MG tablet Take 1 tablet (100 mg total) by mouth every evening. 07/10/17  Yes Awilda Bill, NP  aspirin 81 MG tablet Take 1 tablet (81 mg total) by mouth daily. Patient not taking: Reported on 05/07/2017 07/19/16   [name], DO  cloNIDine (CATAPRES) 0.2 MG tablet Take 0.2 mg by mouth 3 (three) times daily. 03/12/17   [provider]  oxyCODONE-acetaminophen (ROXICET) 5-325 MG tablet Take 1 tablet by mouth every 6 (six) hours as needed for severe pain. Patient not taking: Reported on 05/07/2017 09/10/16   Gabriel Earing, PA-C      VITAL SIGNS:  Blood pressure (!) 198/140, pulse 80, temperature 98.4 F (36.9 C), temperature source Oral, resp. rate (!) 22, height 6\' 1"  (1.854 m), weight 117.9 kg (260 lb), SpO2 97 %.  PHYSICAL EXAMINATION:   Physical Exam  GENERAL:  31 y.o.-year-old patient lying in the bed with no acute distress.  EYES: Pupils equal, round, reactive to light and accommodation. No scleral icterus. Extraocular muscles intact.  HEENT: Head atraumatic, normocephalic. Oropharynx and nasopharynx clear.  NECK:  Supple, no jugular venous distention. No thyroid  enlargement, no tenderness.  LUNGS: Normal breath sounds bilaterally, no wheezing, rales,rhonchi or crepitation. No use of accessory muscles of respiration.  CARDIOVASCULAR: S1, S2 normal. No murmurs, rubs, or gallops. Tender to touch left chest wall muscles close to anterior axillary line ABDOMEN: Soft, nontender, nondistended. Bowel sounds present. No organomegaly or mass.  EXTREMITIES: No pedal edema, cyanosis, or clubbing. Left forearm AV fistula with good thrill NEUROLOGIC: Cranial nerves II through XII are intact. Muscle strength 5/5 in all extremities. Sensation intact. Gait not checked.  PSYCHIATRIC: The patient is alert and oriented x 3.  SKIN: No obvious rash, lesion, or ulcer.   LABORATORY PANEL:   CBC  Recent Labs Lab 09/03/17 1713  WBC 8.1  HGB 13.4  HCT 40.4  PLT 163   ------------------------------------------------------------------------------------------------------------------  Chemistries   Recent Labs Lab 09/03/17 1713  NA 134*  K 3.0*  CL 94*  CO2 28  GLUCOSE 105*  BUN 12  CREATININE 3.11*  CALCIUM 9.1   ------------------------------------------------------------------------------------------------------------------  Cardiac Enzymes  Recent Labs Lab 09/03/17 1713  TROPONINI 0.09*   ------------------------------------------------------------------------------------------------------------------  RADIOLOGY:  Dg Chest 2 View  Result Date:  09/03/2017 CLINICAL DATA:  Chest pain and hypertension EXAM: CHEST  2 VIEW COMPARISON:  Chest radiograph 08/18/2017 FINDINGS: Unchanged cardiomegaly. No focal airspace consolidation. Mild pulmonary vascular congestion but no overt edema. No pleural effusion or pneumothorax. IMPRESSION: Unchanged cardiomegaly without overt pulmonary edema. Electronically Signed   By: Ulyses Jarred M.D.   On: 09/03/2017 17:44    EKG:   Orders placed or performed during the hospital encounter of 09/03/17  . EKG 12-Lead  .  EKG 12-Lead    IMPRESSION AND PLAN:   Alan Gross  is a 31 y.o. male with a known history of uncontrolled hypertension, end-stage renal disease on hemodialysis Monday, Wednesday and Friday, depression and anxiety presents to hospital after dialysis today secondary to chest pain.  #1 chest pain-likely musculoskeletal chest pain or stable angina from elevated blood pressure. Apparently admitted to telemetry under observation. Recycle troponins. -First troponin is slightly elevated-either it could be from his end-stage renal disease or demand ischemia from elevated blood pressure. -We'll hold off on heparin drip unless troponins are significantly elevated. Started on aspirin. -Exercise stress test in a.m. -Continue his other cardiac medications.  #2 hypertensive urgency-home medications are being continued. Patient on clonidine, Coreg, losartan and amlodipine. -IV hydralazine as needed for now  #3 end-stage renal disease on hemodialysis-on Monday, Wednesday and Friday dialysis. Finished dialysis today. If possible discharge tomorrow, can follow up with nephrology as outpatient for Friday dialysis.  #4 hypokalemia-being replaced  #5 DVT prophylaxis-subcutaneous heparin    All the records are reviewed and case discussed with ED provider. Management plans discussed with the patient, family and they are in agreement.  CODE STATUS: Full Code  TOTAL TIME TAKING CARE OF THIS PATIENT: 50 minutes.    Alan Gross M.D on 09/03/2017 at 7:42 PM  Between 7am to 6pm - Pager - [phone]  After 6pm go to [email or web address] - password [email or web address] [place] [facility]  Office  [phone]  CC: Primary care physician; Patient, No Pcp Per

## 2017-09-04 ENCOUNTER — Observation Stay (HOSPITAL_BASED_OUTPATIENT_CLINIC_OR_DEPARTMENT_OTHER): Payer: No Typology Code available for payment source

## 2017-09-04 DIAGNOSIS — R0789 Other chest pain: Secondary | ICD-10-CM | POA: Diagnosis not present

## 2017-09-04 DIAGNOSIS — R079 Chest pain, unspecified: Secondary | ICD-10-CM

## 2017-09-04 LAB — CBC
HCT: 38.3 % — ABNORMAL LOW (ref 40.0–52.0)
Hemoglobin: 12.6 g/dL — ABNORMAL LOW (ref 13.0–18.0)
MCH: 27.6 pg (ref 26.0–34.0)
MCHC: 33 g/dL (ref 32.0–36.0)
MCV: 83.7 fL (ref 80.0–100.0)
PLATELETS: 157 10*3/uL (ref 150–440)
RBC: 4.58 MIL/uL (ref 4.40–5.90)
RDW: 18.4 % — ABNORMAL HIGH (ref 11.5–14.5)
WBC: 5.9 10*3/uL (ref 3.8–10.6)

## 2017-09-04 LAB — BASIC METABOLIC PANEL
Anion gap: 11 (ref 5–15)
BUN: 18 mg/dL (ref 6–20)
CALCIUM: 8.9 mg/dL (ref 8.9–10.3)
CHLORIDE: 95 mmol/L — AB (ref 101–111)
CO2: 27 mmol/L (ref 22–32)
CREATININE: 4.49 mg/dL — AB (ref 0.61–1.24)
GFR, EST AFRICAN AMERICAN: 18 mL/min — AB (ref >60)
GFR, EST NON AFRICAN AMERICAN: 16 mL/min — AB (ref >60)
Glucose, Bld: 102 mg/dL — ABNORMAL HIGH (ref 65–99)
Potassium: 3.4 mmol/L — ABNORMAL LOW (ref 3.5–5.1)
SODIUM: 133 mmol/L — AB (ref 135–145)

## 2017-09-04 LAB — MAGNESIUM: MAGNESIUM: 1.9 mg/dL (ref 1.7–2.4)

## 2017-09-04 LAB — NM MYOCAR MULTI W/SPECT W/WALL MOTION / EF
CHL CUP RESTING HR STRESS: 78 {beats}/min
LV dias vol: 255 mL (ref 62–150)
LVSYSVOL: 186 mL
NUC STRESS TID: 1.23
Peak HR: 104 {beats}/min
Percent HR: 55 %
SDS: 6
SRS: 3
SSS: 9

## 2017-09-04 LAB — TROPONIN I: TROPONIN I: 0.08 ng/mL — AB (ref <0.03)

## 2017-09-04 MED ORDER — TECHNETIUM TC 99M TETROFOSMIN IV KIT
31.8600 | PACK | Freq: Once | INTRAVENOUS | Status: AC | PRN
  Administered 2017-09-04: 31.86 via INTRAVENOUS

## 2017-09-04 MED ORDER — LOSARTAN POTASSIUM 100 MG PO TABS: 100.0000 mg | ORAL_TABLET | Freq: Every evening | ORAL | 1 refills | Status: AC

## 2017-09-04 MED ORDER — POTASSIUM CHLORIDE CRYS ER 20 MEQ PO TBCR
40.0000 meq | EXTENDED_RELEASE_TABLET | Freq: Once | ORAL | Status: AC
  Administered 2017-09-04: 40 meq via ORAL
  Filled 2017-09-04: qty 2

## 2017-09-04 MED ORDER — REGADENOSON 0.4 MG/5ML IV SOLN
0.4000 mg | Freq: Once | INTRAVENOUS | Status: AC
  Administered 2017-09-04: 0.4 mg via INTRAVENOUS
  Filled 2017-09-04: qty 5

## 2017-09-04 MED ORDER — ASPIRIN 81 MG PO TABS: 81.0000 mg | ORAL_TABLET | Freq: Every day | ORAL | 1 refills | Status: DC

## 2017-09-04 MED ORDER — HYDRALAZINE HCL 50 MG PO TABS: 50.0000 mg | ORAL_TABLET | Freq: Two times a day (BID) | ORAL | Status: DC

## 2017-09-04 MED ORDER — TECHNETIUM TC 99M TETROFOSMIN IV KIT
12.6780 | PACK | Freq: Once | INTRAVENOUS | Status: AC | PRN
  Administered 2017-09-04: 12.678 via INTRAVENOUS

## 2017-09-04 MED ORDER — HYDRALAZINE HCL 50 MG PO TABS: 50.0000 mg | ORAL_TABLET | Freq: Two times a day (BID) | ORAL | 1 refills | Status: DC

## 2017-09-04 MED ORDER — CLONIDINE HCL 0.2 MG PO TABS: 0.2000 mg | ORAL_TABLET | Freq: Three times a day (TID) | ORAL | 2 refills | Status: DC

## 2017-09-04 NOTE — Care Management (Signed)
Amanda Morris HD liaison notified of admission.  

## 2017-09-04 NOTE — Progress Notes (Signed)
[place] [facility] [place] NAME: Alan Gross    MR#:  [id number]  DATE OF BIRTH:  07/11/1986  SUBJECTIVE:  CHIEF COMPLAINT:   Chief Complaint  Patient presents with  . Chest Pain   - admitted with chest pain and stress test done this morning. However bigeminy noted on the monitor. Patient is asymptomatic at this time.  REVIEW OF SYSTEMS:  Review of Systems  Constitutional: Negative for chills, fever and malaise/fatigue.  HENT: Negative for congestion, ear discharge, hearing loss and nosebleeds.   Eyes: Negative for blurred vision, double vision and photophobia.  Respiratory: Negative for cough, shortness of breath and wheezing.   Cardiovascular: Negative for chest pain, palpitations and leg swelling.  Gastrointestinal: Negative for abdominal pain, constipation, diarrhea, nausea and vomiting.  Genitourinary: Negative for dysuria and urgency.  Musculoskeletal: Negative for myalgias.  Neurological: Positive for dizziness. Negative for speech change, focal weakness, seizures and headaches.  Psychiatric/Behavioral: Negative for depression.    DRUG ALLERGIES:   Allergies  Allergen Reactions  . Lasix [Furosemide] Shortness Of Breath, Swelling and Anxiety    VITALS:  Blood pressure 122/78, pulse (!) 55, temperature 98.1 F (36.7 C), temperature source Oral, resp. rate 12, height 6\' 1"  (1.854 m), weight 116.3 kg (256 lb 8 oz), SpO2 (!) 88 %.  PHYSICAL EXAMINATION:  Physical Exam  GENERAL:  31 y.o.-year-old patient lying in the bed with no acute distress.  EYES: Pupils equal, round, reactive to light and accommodation. No scleral icterus. Extraocular muscles intact.  HEENT: Head atraumatic, normocephalic. Oropharynx and nasopharynx clear.  NECK:  Supple, no jugular venous distention. No thyroid enlargement, no tenderness.  LUNGS: Normal breath sounds bilaterally, no wheezing, rales,rhonchi or crepitation. No use of accessory muscles of  respiration.  CARDIOVASCULAR: S1, S2 normal. No murmurs, rubs, or gallops.  ABDOMEN: Soft, nontender, nondistended. Bowel sounds present. No organomegaly or mass.  EXTREMITIES: No pedal edema, cyanosis, or clubbing. Left forearm AV fistula with good thrill NEUROLOGIC: Cranial nerves II through XII are intact. Muscle strength 5/5 in all extremities. Sensation intact. Gait not checked.  PSYCHIATRIC: The patient is alert and oriented x 3.  SKIN: No obvious rash, lesion, or ulcer  LABORATORY PANEL:   CBC  Recent Labs Lab 09/04/17 0453  WBC 5.9  HGB 12.6*  HCT 38.3*  PLT 157   ------------------------------------------------------------------------------------------------------------------  Chemistries   Recent Labs Lab 09/04/17 0453  NA 133*  K 3.4*  CL 95*  CO2 27  GLUCOSE 102*  BUN 18  CREATININE 4.49*  CALCIUM 8.9  MG 1.9   ------------------------------------------------------------------------------------------------------------------  Cardiac Enzymes  Recent Labs Lab 09/04/17 0453  TROPONINI 0.08*   ------------------------------------------------------------------------------------------------------------------  RADIOLOGY:  Dg Chest 2 View  Result Date: 09/03/2017 CLINICAL DATA:  Chest pain and hypertension EXAM: CHEST  2 VIEW COMPARISON:  Chest radiograph 08/18/2017 FINDINGS: Unchanged cardiomegaly. No focal airspace consolidation. Mild pulmonary vascular congestion but no overt edema. No pleural effusion or pneumothorax. IMPRESSION: Unchanged cardiomegaly without overt pulmonary edema. Electronically Signed   By: Ulyses Jarred M.D.   On: 09/03/2017 17:44   Nm Myocar Multi W/spect W/wall Motion / Ef  Result Date: 09/04/2017  This is an intermediate risk study due to reduced EF.  The left ventricular ejection fraction is moderately decreased (30-44%).  Normal perfusion. No evidence of ischemia or infarct.  Suspect hypertensive heart disease with  cardiomyopathy.     EKG:   Orders placed or performed during the hospital encounter of 09/03/17  .  EKG 12-Lead  . EKG 12-Lead    ASSESSMENT AND PLAN:   Alan Gross  is a 31 y.o. male with a known history of uncontrolled hypertension, end-stage renal disease on hemodialysis Monday, Wednesday and Friday, depression and anxiety presents to hospital after dialysis today secondary to chest pain.  #1 chest pain-likely musculoskeletal chest pain or stable angina from elevated blood pressure. -troponins were trended and stable at this time. -First troponin is slightly elevated-it could be from his end-stage renal disease or demand ischemia from elevated blood pressure. Started on low-dose aspirin. Exercise stress test done this morning and results are pending -Exercise stress test in a.m. -Continue his other cardiac medications.  #2 hypertensive urgency-home medications are being continued. Patient on clonidine, Coreg, losartan and amlodipine.oral hydralazine added as well -IV hydralazine as needed for now  #3 end-stage renal disease on hemodialysis-on Monday, Wednesday and Friday dialysis. Finished dialysis yesterday prior to admission. -if ends up staying, we'll call nephrology for dialysis tomorrow.  #4 bigeminy on the monitor-ordered echocardiogram for ejection fraction. -Cardiology consult. Magnesium is a 1.9, potassium is low but he is a dialysis patient.  #5 DVT prophylaxis-subcutaneous heparin     All the records are reviewed and case discussed with Care Management/Social Workerr. Management plans discussed with the patient, family and they are in agreement.  CODE STATUS: Full Code  TOTAL TIME TAKING CARE OF THIS PATIENT: 38 minutes.   POSSIBLE D/C IN 1-2 DAYS, DEPENDING ON CLINICAL CONDITION.   Latona Krichbaum M.D on 09/04/2017 at 3:23 PM  Between 7am to 6pm - Pager - [phone]  After 6pm go to [email or web address] - password [email or web address]  Office  [phone]  CC: Primary care physician; Patient, No Pcp Per

## 2017-09-04 NOTE — Progress Notes (Signed)
CMD reports bigemmany. Pt is asymptomatic. RN identifies bigemany on telemetry. MD notified. No new orders. I will continue to assess.

## 2017-09-04 NOTE — Plan of Care (Signed)
Problem: Safety: Goal: Ability to remain free from injury will improve Outcome: Completed/Met Date Met: 09/04/17 Pt remained injury free while under my care.    

## 2017-09-04 NOTE — Consult Note (Signed)
Cardiology Consultation Note  Patient ID: Alan Gross, MRN: [id number], DOB/AGE: 1986/10/04 31 y.o. Admit date: 09/03/2017   Date of Consult: 09/04/2017 Primary Physician: Patient, No Pcp Per Primary Cardiologist: New to [facility] - consult by Fletcher Anon Requesting Physician: Dr. Tressia Miners, MD  Chief Complaint: Chest pain Reason for Consult: Abnormal stress test/ventricular bigeminy   HPI: Alan Gross is a 31 y.o. male who is being seen today for the evaluation of ventricular bigeminy at the request of Dr. Tressia Miners, MD. Patient has a h/o normal coronary arteries by LHC on 05/02/2015, ESRD on HD MWF, hypertensive heart disease, prior stroke, medication noncompliance, anxiety and depression who presented to [facility] with chest pain.  Patient has previously been seen by Dr. Humphrey Rolls in 2016, undergoing [place] on 05/02/2015 for chest pain in the setting of hypertensive emergency with a minimally elevated troponin of 0.09. LHC showed normal coronary arteries with EF 60%. He was also positive on UDS for THC and benzos. He has undergone secondary hypertension work up, which was negative. TSH has been normal. He has had frequent ED visits and hospital admissions for hypertensive emergencies. He was recently admitted in late August 2018 for chest pain and SOB. He was noted to be hypertensive with BP of 215/155 in the setting of medication noncompliance. Troponin peaked at 0.40. BNP 3509. He was also hypokalemic. This was felt to be demand ischemia. Echo showed EF of 55-60% with severe concentric LVH. He was consulted on by Dr. Ubaldo Glassing who recommended treatment of his BP and repletion of potassium. Patient was recently seen in the ED on 10/1 with chest pain following HD. Initial blood pressure is not documented in the ED note, though there is mention of BP improved at 924Q systolic. Troponin noted to be 0.07, not cycled. BNP 1545. CXR not acute. He left AMA.   Patient returned to [facility] on 10/17 with development of chest  pain following HD. BP was noted to be 217/151 upon arrival. Troponin peaked at 0.09. Potassium low at 3.0. EKG showed lateral TWI. CXR showed unchanged CM without pulmonary edema. His BP was treated to 683 systolic currently. He underwent nuclear stress per IM this morning. Initially started as treadmill Myoview though the patient was not able to complete due to hypertensive BP response with BP of 210/120 in stage 2 and noted SOB. He was changed to [facility] that required aminophylline reversal. Results of the stress test were read as intermediate due to EF of 30-44%. There were no perfusion defects. It was recommended the patient have his BP controlled and follow up with cardiology. IM MD was notified of ventricular bigeminy, asymptomatic. Magnesium and TSH are pending.     Past Medical History:  Diagnosis Date  . Anxiety   . Depression   . Dyspnea   . ESRD (end stage renal disease) ([place])   . H/O cardiac catheterization   . History of febrile seizure   . Hypertension   . Irregular heart beats   . Kidney failure    M,W,F [place] in [place]; 15 %   . Stroke (cerebrum) ([facility])    no residual effects      Most Recent Cardiac Studies: Echo 07/10/2017: Study Conclusions  - Left ventricle: The cavity size was normal. There was severe   concentric hypertrophy. Systolic function was normal. The   estimated ejection fraction was in the range of 55% to 60%. - Aortic valve: Valve area (Vmax): 2.43 cm^2.  Lexiscan Myoview 09/04/2017: Study Result  This is an intermediate risk study due to reduced EF.  The left ventricular ejection fraction is moderately decreased (30-44%).  Normal perfusion. No evidence of ischemia or infarct.  Suspect hypertensive heart disease with cardiomyopathy.     Surgical History:  Past Surgical History:  Procedure Laterality Date  . A/V FISTULAGRAM N/A 05/07/2017   Procedure: A/V Fistulagram;  Surgeon: Algernon Huxley, MD;  Location: [place] [facility];   Service: Cardiovascular;  Laterality: N/A;  . AV FISTULA PLACEMENT Left 09/10/2016   Procedure: LEFT ARTERIOVENOUS (AV) FISTULA CREATION;  Surgeon: Conrad Cavalier, MD;  Location: [place];  Service: Vascular;  Laterality: Left;  . CARDIAC CATHETERIZATION Left 05/02/2015   Procedure: Left Heart Cath and Coronary Angiography;  Surgeon: Dionisio David, MD;  Location: [place] [facility];  Service: Cardiovascular;  Laterality: Left;  . CORONARY ANGIOPLASTY    . PERIPHERAL VASCULAR CATHETERIZATION N/A 07/11/2016   Procedure: Dialysis/Perma Catheter Insertion;  Surgeon: Algernon Huxley, MD;  Location: [place] [facility];  Service: Cardiovascular;  Laterality: N/A;  . PERIPHERAL VASCULAR THROMBECTOMY Left 05/07/2017   Procedure: Peripheral Vascular Thrombectomy;  Surgeon: Algernon Huxley, MD;  Location: [place] [facility];  Service: Cardiovascular;  Laterality: Left;     Home Meds: Prior to Admission medications   Medication Sig Start Date End Date Taking? Authorizing Provider  amLODipine (NORVASC) 10 MG tablet Take 1 tablet (10 mg total) by mouth every evening. 07/10/17  Yes Awilda Bill, NP  carvedilol (COREG) 25 MG tablet Take 1 tablet (25 mg total) by mouth 2 (two) times daily with a meal. 07/10/17  Yes Awilda Bill, NP  aspirin 81 MG tablet Take 1 tablet (81 mg total) by mouth daily. 09/04/17   Gladstone Lighter, MD  cloNIDine (CATAPRES) 0.2 MG tablet Take 1 tablet (0.2 mg total) by mouth 3 (three) times daily. 09/04/17   Gladstone Lighter, MD  hydrALAZINE (APRESOLINE) 50 MG tablet Take 1 tablet (50 mg total) by mouth 2 (two) times daily. 09/04/17   Gladstone Lighter, MD  losartan (COZAAR) 100 MG tablet Take 1 tablet (100 mg total) by mouth every evening. 09/04/17   Gladstone Lighter, MD  oxyCODONE-acetaminophen (ROXICET) 5-325 MG tablet Take 1 tablet by mouth every 6 (six) hours as needed for severe pain. Patient not taking: Reported on 05/07/2017 09/10/16   Gabriel Earing, PA-C     Inpatient Medications:  . amLODipine  10 mg Oral QPM  . aspirin  81 mg Oral Daily  . carvedilol  25 mg Oral BID WC  . cloNIDine  0.2 mg Oral TID  . heparin  5,000 Units Subcutaneous Q8H  . losartan  100 mg Oral QPM     Allergies:  Allergies  Allergen Reactions  . Lasix [Furosemide] Shortness Of Breath, Swelling and Anxiety    Social History   Social History  . Marital status: Legally Separated    Spouse name: N/A  . Number of children: N/A  . Years of education: N/A   Occupational History  . 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 Smoking status: Current Every Day Smoker    Packs/day: 0.25    Years: 10.00    Types: Cigarettes    Last attempt to quit: 06/22/2016  . Smokeless tobacco: Never Used  . Alcohol use No  . Drug use: Yes    Types: Marijuana  . Sexual activity: Yes   Other Topics Concern  . Not on file  Social History Narrative   Active and independent at baseline     Family History  Problem Relation Age of Onset  . Hypertension Other   . Cancer Father        liver  . Hypertension Brother   . Diabetes Paternal Aunt   . Diabetes Paternal Uncle      Review of Systems: Review of Systems  Constitutional: Positive for malaise/fatigue. Negative for chills, diaphoresis, fever and weight loss.  HENT: Negative for congestion.   Eyes: Negative for discharge and redness.  Respiratory: Positive for shortness of breath. Negative for cough, hemoptysis, sputum production and wheezing.   Cardiovascular: Positive for chest pain. Negative for palpitations, orthopnea, claudication, leg swelling and PND.  Gastrointestinal: Negative for abdominal pain, blood in stool, heartburn, melena, nausea and vomiting.  Genitourinary: Negative for hematuria.  Musculoskeletal: Negative for falls and myalgias.  Skin: Negative for rash.  Neurological: Positive for weakness. Negative for dizziness, tingling, tremors, sensory change, speech change, focal  weakness and loss of consciousness.  Endo/Heme/Allergies: Does not bruise/bleed easily.  Psychiatric/Behavioral: Negative for substance abuse. The patient is not nervous/anxious.   All other systems reviewed and are negative.   Labs:  Recent Labs  09/03/17 1713 09/03/17 2312 09/04/17 0453  TROPONINI 0.09* 0.09* 0.08*   Lab Results  Component Value Date   WBC 5.9 09/04/2017   HGB 12.6 (L) 09/04/2017   HCT 38.3 (L) 09/04/2017   MCV 83.7 09/04/2017   PLT 157 09/04/2017    Recent Labs Lab 09/04/17 0453  NA 133*  K 3.4*  CL 95*  CO2 27  BUN 18  CREATININE 4.49*  CALCIUM 8.9  GLUCOSE 102*   Lab Results  Component Value Date   CHOL 146 05/03/2015   HDL 40 (L) 05/03/2015   LDLCALC 96 05/03/2015   TRIG 51 05/03/2015   No results found for: DDIMER  Radiology/Studies:  Dg Chest 2 View  Result Date: 09/03/2017 CLINICAL DATA:  Chest pain and hypertension EXAM: CHEST  2 VIEW COMPARISON:  Chest radiograph 08/18/2017 FINDINGS: Unchanged cardiomegaly. No focal airspace consolidation. Mild pulmonary vascular congestion but no overt edema. No pleural effusion or pneumothorax. IMPRESSION: Unchanged cardiomegaly without overt pulmonary edema. Electronically Signed   By: Ulyses Jarred M.D.   On: 09/03/2017 17:44    Nm Myocar Multi W/spect W/wall Motion / Ef  Result Date: 09/04/2017  This is an intermediate risk study due to reduced EF.  The left ventricular ejection fraction is moderately decreased (30-44%).  Normal perfusion. No evidence of ischemia or infarct.  Suspect hypertensive heart disease with cardiomyopathy.     EKG: Interpreted by me showed: NSR, 82 bpm, LVH with early repolarization, lateral TWI Telemetry: Interpreted by me showed: NSR with frequent PVCs in a pattern of bigeminy   Weights: Filed Weights   09/03/17 2137 09/03/17 2143 09/04/17 0500  Weight: 256 lb 11.2 oz (116.4 kg) 256 lb 8 oz (116.3 kg) 256 lb 8 oz (116.3 kg)     Physical Exam: Blood  pressure 122/78, pulse (!) 55, temperature 98.1 F (36.7 C), temperature source Oral, resp. rate 12, height 6\' 1"  (1.854 m), weight 256 lb 8 oz (116.3 kg), SpO2 (!) 88 %. Body mass index is 33.84 kg/m. General: Well developed, well nourished, in no acute distress. Head: Normocephalic, atraumatic, sclera non-icteric, no xanthomas, nares are without discharge.  Neck: Negative for carotid bruits. JVD not elevated. Lungs: Clear bilaterally to auscultation without wheezes, rales, or rhonchi. Breathing is unlabored. Heart: RRR with S1  S2. No murmurs, rubs, or gallops appreciated. Abdomen: Soft, non-tender, non-distended with normoactive bowel sounds. No hepatomegaly. No rebound/guarding. No obvious abdominal masses. Msk:  Strength and tone appear normal for age. Extremities: No clubbing or cyanosis. No edema. Distal pedal pulses are 2+ and equal bilaterally. Neuro: Alert and oriented X 3. No facial asymmetry. No focal deficit. Moves all extremities spontaneously. Psych:  Responds to questions appropriately with a normal affect.    Assessment and Plan:  Active Problems:   Chest pain    1. Ventricular bigeminy: -Asymptomatic  -Check magnesium and TSH -Recommend repletion of potassium, will defer to renal given he is ESRD on HD MWF -Echo -Continue Coreg  2. NICM: -Likely 2/2 hypertensive heart disease -He needs optimal BP control -Volume managed by HD  -Echo pending to confirm EF -If EF is found to be low on echo would favor discontinuing amlodipine and placing patient on Imdur/hjydralazine   3. Elevated troponin/chest pain: -Recent LHC 04/2015 that showed normal coronary arteries  -Stress test without perfusion defects as above -Medical management as above  4. Hypokalemia: -Recommend repletion to goal > 4.0, defer to Renal given HD   Signed, Christell Faith, PA-C [place] Pager: [phone] 09/04/2017, 2:52 PM

## 2017-09-05 NOTE — Discharge Summary (Signed)
[facility] [place] NAME: Alan Gross    MR#:  [id number]  DATE OF BIRTH:  [birth year]  DATE OF ADMISSION:  09/03/2017   ADMITTING PHYSICIAN: Gladstone Lighter, MD  DATE OF DISCHARGE: 09/04/2017  5:58 PM  PRIMARY CARE PHYSICIAN: Patient, No Pcp Per   ADMISSION DIAGNOSIS:   ESRD (end stage renal disease) on dialysis ([place]) [N18.6, Z99.2] Hypertensive urgency [I16.0] Chest pain, unspecified type [R07.9]  DISCHARGE DIAGNOSIS:   Active Problems:   Chest pain   SECONDARY DIAGNOSIS:   Past Medical History:  Diagnosis Date  . Anxiety   . Depression   . Dyspnea   . ESRD (end stage renal disease) ([place])   . H/O cardiac catheterization   . History of febrile seizure   . Hypertension   . Irregular heart beats   . Kidney failure    M,W,F [facility] in [place]; 15 %   . Stroke (cerebrum) [facility])    no residual effects    HOSPITAL COURSE:   Alan Gross a 31 y.o. malewith a known history of uncontrolled hypertension, end-stage renal disease on hemodialysis Monday, Wednesday and Friday, depression and anxiety presents to hospital after dialysis today secondary to chest pain.  #1 chest pain-likely musculoskeletal chest pain -troponins were trended - slightly elevated but stable at this time. -First troponin is slightly elevated-it could be from his end-stage renal disease or demand ischemia from elevated blood pressure. - on low-dose aspirin. Exercise stress test done this morning Showed no ischemia but had reduced ejection fraction and wall motion abnormalities. -Cardiology recommended echocardiogram and further monitoring. However patient wanted to leave and requested for outpatient follow-up -Continue his other cardiac medications.  #2 hypertensive urgency-home medications are being continued. Patient on clonidine, Coreg, losartan and amlodipine.oral hydralazine added as well  #3 end-stage renal disease on hemodialysis-on  Monday, Wednesday and Friday dialysis. Finished dialysis prior to admission. -Dialysis as outpatient on Friday  #4 bigeminy on the monitor-echocardiogram for ejection fraction and wall motion abnormalities. -Cardiology consulted. Magnesium is a 1.9,  -Outpatient follow-up as per patient's request.  Patient wanted to be discharged  DISCHARGE CONDITIONS:   Guarded  CONSULTS OBTAINED:   Treatment Team:  Wellington Hampshire, MD  Dr. Lavonia Dana, M.D, Nephrology  DRUG ALLERGIES:   Allergies  Allergen Reactions  . Lasix [Furosemide] Shortness Of Breath, Swelling and Anxiety   DISCHARGE MEDICATIONS:   Allergies as of 09/04/2017      Reactions   Lasix [furosemide] Shortness Of Breath, Swelling, Anxiety      Medication List    STOP taking these medications   oxyCODONE-acetaminophen 5-325 MG tablet Commonly known as:  ROXICET     TAKE these medications   amLODipine 10 MG tablet Commonly known as:  NORVASC Take 1 tablet (10 mg total) by mouth every evening.   aspirin 81 MG tablet Take 1 tablet (81 mg total) by mouth daily.   carvedilol 25 MG tablet Commonly known as:  COREG Take 1 tablet (25 mg total) by mouth 2 (two) times daily with a meal.   cloNIDine 0.2 MG tablet Commonly known as:  CATAPRES Take 1 tablet (0.2 mg total) by mouth 3 (three) times daily.   hydrALAZINE 50 MG tablet Commonly known as:  APRESOLINE Take 1 tablet (50 mg total) by mouth 2 (two) times daily.   losartan 100 MG tablet Commonly known as:  COZAAR Take 1 tablet (100 mg total) by mouth every evening.  DISCHARGE INSTRUCTIONS:   1. PCP f/u in 1-2 weeks 2. Cardiology f/u in 1-2 weeks 3. For dialysis on 09/05/17  DIET:   Renal diet  ACTIVITY:   Activity as tolerated  OXYGEN:   Home Oxygen: No.  Oxygen Delivery: room air  DISCHARGE LOCATION:   home   If you experience worsening of your admission symptoms, develop shortness of breath, life threatening emergency,  suicidal or homicidal thoughts you must seek medical attention immediately by calling 911 or calling your MD immediately  if symptoms less severe.  You Must read complete instructions/literature along with all the possible adverse reactions/side effects for all the Medicines you take and that have been prescribed to you. Take any new Medicines after you have completely understood and accpet all the possible adverse reactions/side effects.   Please note  You were cared for by a hospitalist during your hospital stay. If you have any questions about your discharge medications or the care you received while you were in the hospital after you are discharged, you can call the unit and asked to speak with the hospitalist on call if the hospitalist that took care of you is not available. Once you are discharged, your primary care physician will handle any further medical issues. Please note that NO REFILLS for any discharge medications will be authorized once you are discharged, as it is imperative that you return to your primary care physician (or establish a relationship with a primary care physician if you do not have one) for your aftercare needs so that they can reassess your need for medications and monitor your lab values.    On the day of Discharge:  VITAL SIGNS:   Blood pressure (!) 133/95, pulse (!) 55, temperature 98.1 F (36.7 C), temperature source Oral, resp. rate 12, height 6\' 1"  (1.854 m), weight 116.3 kg (256 lb 8 oz), SpO2 (!) 88 %.  PHYSICAL EXAMINATION:    GENERAL: 31 y.o.-year-old patient lying in the bed with no acute distress.  EYES: Pupils equal, round, reactive to light and accommodation. No scleral icterus. Extraocular muscles intact.  HEENT: Head atraumatic, normocephalic. Oropharynx and nasopharynx clear.  NECK: Supple, no jugular venous distention. No thyroid enlargement, no tenderness.  LUNGS: Normal breath sounds bilaterally, no wheezing, rales,rhonchi or crepitation.  No use of accessory muscles of respiration.  CARDIOVASCULAR: S1, S2 normal. No murmurs, rubs, or gallops.  ABDOMEN: Soft, nontender, nondistended. Bowel sounds present. No organomegaly or mass.  EXTREMITIES: No pedal edema, cyanosis, or clubbing. Left forearm AV fistula with good thrill NEUROLOGIC: Cranial nerves II through XII are intact. Muscle strength 5/5 in all extremities. Sensation intact. Gait not checked.  PSYCHIATRIC: The patient is alert and oriented x 3.  SKIN: No obvious rash, lesion, or ulcer   DATA REVIEW:   CBC  Recent Labs Lab 09/04/17 0453  WBC 5.9  HGB 12.6*  HCT 38.3*  PLT 157    Chemistries   Recent Labs Lab 09/04/17 0453  NA 133*  K 3.4*  CL 95*  CO2 27  GLUCOSE 102*  BUN 18  CREATININE 4.49*  CALCIUM 8.9  MG 1.9     Microbiology Results  Results for orders placed or performed during the hospital encounter of 09/03/17  MRSA PCR Screening     Status: None   Collection Time: 09/03/17 10:30 PM  Result Value Ref Range Status   MRSA by PCR NEGATIVE NEGATIVE Final    Comment:        The GeneXpert MRSA Assay (  FDA approved for NASAL specimens only), is one component of a comprehensive MRSA colonization surveillance program. It is not intended to diagnose MRSA infection nor to guide or monitor treatment for MRSA infections.     RADIOLOGY:  No results found.   Management plans discussed with the patient, family and they are in agreement.  CODE STATUS:  Code Status History    Date Active Date Inactive Code Status Order ID Comments User Context   09/03/2017  9:41 PM 09/04/2017  9:12 PM Full Code [id number]  Gladstone Lighter, MD Inpatient   07/09/2017 11:03 AM 07/10/2017  7:14 PM Full Code [id number]  Epifanio Lesches, MD ED   05/07/2017  1:26 PM 05/07/2017  5:48 PM Full Code [id number]  Algernon Huxley, MD Inpatient   04/01/2017 10:21 PM 04/03/2017  3:48 PM Full Code [id number]  Harrie Foreman, MD Inpatient   07/09/2016  7:50 PM 07/14/2016   2:49 PM Full Code [id number]  Hower, Aaron Mose, MD ED   04/16/2016 10:51 PM 04/18/2016  8:14 PM Full Code [id number]  Quintella Baton, MD Inpatient   05/02/2015  7:22 PM 05/04/2015  2:49 PM Full Code [id number]  Demetrios Loll, MD Inpatient   05/02/2015  3:32 PM 05/02/2015  7:22 PM Full Code [id number]  Dionisio David, MD ED      TOTAL TIME TAKING CARE OF THIS PATIENT: 38  minutes.    Gladstone Lighter M.D on 09/05/2017 at 2:57 PM  Between 7am to 6pm - Pager - [phone]  After 6pm go to [email or web address] - Proofreader  [employer]  Hospitalists  Office  [phone]  CC: Primary care physician; Patient, No Pcp Per   Note: This dictation was prepared with Dragon dictation along with smaller phrase technology. Any transcriptional errors that result from this process are unintentional.

## 2017-10-26 ENCOUNTER — Emergency Department: Payer: No Typology Code available for payment source

## 2017-10-26 ENCOUNTER — Observation Stay
Admission: EM | Admit: 2017-10-26 | Discharge: 2017-10-27 | DRG: 640 | Discharge status: Against Medical Advice | Payer: No Typology Code available for payment source | Attending: Internal Medicine | Admitting: Internal Medicine

## 2017-10-26 DIAGNOSIS — D631 Anemia in chronic kidney disease: Secondary | ICD-10-CM | POA: Diagnosis present

## 2017-10-26 DIAGNOSIS — E8770 Fluid overload, unspecified: Secondary | ICD-10-CM | POA: Diagnosis not present

## 2017-10-26 DIAGNOSIS — I161 Hypertensive emergency: Secondary | ICD-10-CM | POA: Diagnosis present

## 2017-10-26 DIAGNOSIS — Z992 Dependence on renal dialysis: Secondary | ICD-10-CM | POA: Diagnosis not present

## 2017-10-26 DIAGNOSIS — F419 Anxiety disorder, unspecified: Secondary | ICD-10-CM | POA: Diagnosis present

## 2017-10-26 DIAGNOSIS — Z91040 Latex allergy status: Secondary | ICD-10-CM | POA: Diagnosis not present

## 2017-10-26 DIAGNOSIS — Z79899 Other long term (current) drug therapy: Secondary | ICD-10-CM

## 2017-10-26 DIAGNOSIS — N186 End stage renal disease: Secondary | ICD-10-CM | POA: Diagnosis present

## 2017-10-26 DIAGNOSIS — Z9861 Coronary angioplasty status: Secondary | ICD-10-CM | POA: Diagnosis not present

## 2017-10-26 DIAGNOSIS — F17210 Nicotine dependence, cigarettes, uncomplicated: Secondary | ICD-10-CM | POA: Diagnosis present

## 2017-10-26 DIAGNOSIS — R778 Other specified abnormalities of plasma proteins: Secondary | ICD-10-CM | POA: Diagnosis present

## 2017-10-26 DIAGNOSIS — Z7982 Long term (current) use of aspirin: Secondary | ICD-10-CM | POA: Diagnosis not present

## 2017-10-26 DIAGNOSIS — Z8673 Personal history of transient ischemic attack (TIA), and cerebral infarction without residual deficits: Secondary | ICD-10-CM | POA: Diagnosis not present

## 2017-10-26 DIAGNOSIS — F329 Major depressive disorder, single episode, unspecified: Secondary | ICD-10-CM | POA: Diagnosis present

## 2017-10-26 DIAGNOSIS — I10 Essential (primary) hypertension: Secondary | ICD-10-CM

## 2017-10-26 DIAGNOSIS — N2581 Secondary hyperparathyroidism of renal origin: Secondary | ICD-10-CM | POA: Diagnosis present

## 2017-10-26 DIAGNOSIS — J810 Acute pulmonary edema: Secondary | ICD-10-CM | POA: Diagnosis present

## 2017-10-26 DIAGNOSIS — I1311 Hypertensive heart and chronic kidney disease without heart failure, with stage 5 chronic kidney disease, or end stage renal disease: Secondary | ICD-10-CM | POA: Diagnosis present

## 2017-10-26 DIAGNOSIS — R0602 Shortness of breath: Secondary | ICD-10-CM

## 2017-10-26 LAB — BRAIN NATRIURETIC PEPTIDE: B NATRIURETIC PEPTIDE 5: 2478 pg/mL — AB (ref 0.0–100.0)

## 2017-10-26 LAB — CBC
HEMATOCRIT: 34.8 % — AB (ref 40.0–52.0)
Hemoglobin: 11.8 g/dL — ABNORMAL LOW (ref 13.0–18.0)
MCH: 28.9 pg (ref 26.0–34.0)
MCHC: 33.9 g/dL (ref 32.0–36.0)
MCV: 85.2 fL (ref 80.0–100.0)
PLATELETS: 150 10*3/uL (ref 150–440)
RBC: 4.09 MIL/uL — ABNORMAL LOW (ref 4.40–5.90)
RDW: 16.2 % — AB (ref 11.5–14.5)
WBC: 7.4 10*3/uL (ref 3.8–10.6)

## 2017-10-26 LAB — COMPREHENSIVE METABOLIC PANEL
ALBUMIN: 3.4 g/dL — AB (ref 3.5–5.0)
ALT: 12 U/L — ABNORMAL LOW (ref 17–63)
ANION GAP: 12 (ref 5–15)
AST: 17 U/L (ref 15–41)
Alkaline Phosphatase: 66 U/L (ref 38–126)
BILIRUBIN TOTAL: 0.7 mg/dL (ref 0.3–1.2)
BUN: 37 mg/dL — AB (ref 6–20)
CHLORIDE: 98 mmol/L — AB (ref 101–111)
CO2: 24 mmol/L (ref 22–32)
Calcium: 8.6 mg/dL — ABNORMAL LOW (ref 8.9–10.3)
Creatinine, Ser: 6.63 mg/dL — ABNORMAL HIGH (ref 0.61–1.24)
GFR calc Af Amer: 11 mL/min — ABNORMAL LOW (ref >60)
GFR calc non Af Amer: 10 mL/min — ABNORMAL LOW (ref >60)
GLUCOSE: 118 mg/dL — AB (ref 65–99)
POTASSIUM: 3.3 mmol/L — AB (ref 3.5–5.1)
SODIUM: 134 mmol/L — AB (ref 135–145)
TOTAL PROTEIN: 6.5 g/dL (ref 6.5–8.1)

## 2017-10-26 LAB — LIPASE, BLOOD: Lipase: 26 U/L (ref 11–51)

## 2017-10-26 LAB — TROPONIN I: Troponin I: 0.12 ng/mL (ref <0.03)

## 2017-10-26 MED ORDER — ONDANSETRON HCL 4 MG/2ML IJ SOLN
4.0000 mg | Freq: Once | INTRAMUSCULAR | Status: AC
  Administered 2017-10-26: 4 mg via INTRAVENOUS
  Filled 2017-10-26: qty 2

## 2017-10-26 NOTE — ED Notes (Signed)
Spoke with dr. Dahlia Client regarding pt's hypertension. No new orders received at this time.

## 2017-10-26 NOTE — ED Triage Notes (Signed)
Pt states several days of vomiting, nasal congestion. Pt states he is short of breath and has chest pain. Pt states has dialysis on MWF. Pt then states he is short of breath "all the time".

## 2017-10-26 NOTE — ED Provider Notes (Signed)
[facility] Emergency Department Provider Note   ____________________________________________   First MD Initiated Contact with Patient 10/26/17 2300     (approximate)  I have reviewed the triage vital signs and the nursing notes.   HISTORY  Chief Complaint Shortness of Breath; Emesis; Chest Pain; and Nasal Congestion    HPI Alan Gross is a 31 y.o. male with a history of end-stage renal disease on dialysis Monday Wednesday who comes into the hospital today with some vomiting chest pains and shortness of breath.  According to EMS the patient states that every time he tries to eat he has nausea and vomiting.  He feels like he has a gagging sensation in his throat and has lots of mucus and sinus drainage.  The patient had dialysis on Friday but his blood pressure has been elevated.  He states that he is unsure if he is keeping down his medicines.  He also reports that he is missed his losartan multiple times this week.  The patient states that his shortness of breath is worse when he lays flat.  He reports that the chest pain and shortness of breath has been for the past 2 days.  He is also had some chills with some mild cough.  He states that his pain is on the left side of his chest laterally and is a 2 out of 10 in intensity currently.  He has no diarrhea and no abdominal pain.  The patient also denies any sick contacts.  He is here for evaluation today.   Past Medical History:  Diagnosis Date  . Anxiety   . Depression   . Dyspnea   . ESRD (end stage renal disease) ([place])   . H/O cardiac catheterization   . History of febrile seizure   . Hypertension   . Irregular heart beats   . Kidney failure    M,W,F [facility] in [place]; 15 %   . 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 Fluid overload 10/27/2017  . Chest pain 09/03/2017  . Elevated troponin 08/18/2017  . Adjustment disorder with  mixed disturbance of emotions and conduct 08/18/2017  . Hypertensive urgency 04/01/2017  . ESRD (end stage renal disease) on dialysis ([place]) 07/19/2016  . Anxiety 07/19/2016  . Heart failure, diastolic, due to HTN ([place]) 07/19/2016  . Costochondritis 07/19/2016  . HTN (hypertension) 07/09/2016    Past Surgical History:  Procedure Laterality Date  . A/V FISTULAGRAM N/A 05/07/2017   Procedure: A/V Fistulagram;  Surgeon: Algernon Huxley, MD;  Location: [place] [facility];  Service: Cardiovascular;  Laterality: N/A;  . AV FISTULA PLACEMENT Left 09/10/2016   Procedure: LEFT ARTERIOVENOUS (AV) FISTULA CREATION;  Surgeon: Conrad Mellette, MD;  Location: [facility];  Service: Vascular;  Laterality: Left;  . CARDIAC CATHETERIZATION Left 05/02/2015   Procedure: Left Heart Cath and Coronary Angiography;  Surgeon: Dionisio David, MD;  Location: [facility] [facility];  Service: Cardiovascular;  Laterality: Left;  . CORONARY ANGIOPLASTY    . PERIPHERAL VASCULAR CATHETERIZATION N/A 07/11/2016   Procedure: Dialysis/Perma Catheter Insertion;  Surgeon: Algernon Huxley, MD;  Location: [place] [facility];  Service: Cardiovascular;  Laterality: N/A;  . PERIPHERAL VASCULAR THROMBECTOMY Left 05/07/2017   Procedure: Peripheral Vascular Thrombectomy;  Surgeon: Algernon Huxley, MD;  Location: [place] [facility];  Service: Cardiovascular;  Laterality: Left;    Prior to Admission medications  Medication Sig Start Date End Date Taking? Authorizing Provider  amLODipine (NORVASC) 10 MG tablet Take 1 tablet (10 mg total) by mouth every evening. 07/10/17   Awilda Bill, NP  aspirin 81 MG tablet Take 1 tablet (81 mg total) by mouth daily. 09/04/17   Gladstone Lighter, MD  carvedilol (COREG) 25 MG tablet Take 1 tablet (25 mg total) by mouth 2 (two) times daily with a meal. 07/10/17   Awilda Bill, NP  cloNIDine (CATAPRES) 0.2 MG tablet Take 1 tablet (0.2 mg total) by mouth 3 (three) times daily. 09/04/17   Gladstone Lighter, MD    hydrALAZINE (APRESOLINE) 50 MG tablet Take 1 tablet (50 mg total) by mouth 2 (two) times daily. 09/04/17   Gladstone Lighter, MD  losartan (COZAAR) 100 MG tablet Take 1 tablet (100 mg total) by mouth every evening. 09/04/17   Gladstone Lighter, MD    Allergies Lasix [furosemide]  Family History  Problem Relation Age of Onset  . Hypertension Other   . Cancer Father        liver  . Hypertension Brother   . Diabetes Paternal Aunt   . Diabetes Paternal Uncle     Social History Social History   Tobacco Use  . Smoking status: Current Every Day Smoker    Packs/day: 0.25    Years: 10.00    Pack years: 2.50    Types: Cigarettes    Last attempt to quit: 06/22/2016    Years since quitting: 1.3  . Smokeless tobacco: Never Used  Substance Use Topics  . Alcohol use: No  . Drug use: Yes    Types: Marijuana    Review of Systems  Constitutional: No fever/chills Eyes: No visual changes. ENT: Nasal congestion Cardiovascular: chest pain. Respiratory:  shortness of breath. Gastrointestinal: Nausea and vomiting no abdominal pain.  No diarrhea.  No constipation. Genitourinary: Negative for dysuria. Musculoskeletal: Negative for back pain. Skin: Negative for rash. Neurological: Negative for headaches, focal weakness or numbness.   ____________________________________________   PHYSICAL EXAM:  VITAL SIGNS: ED Triage Vitals  Enc Vitals Group     BP 10/26/17 2306 (!) 225/151     Pulse Rate 10/26/17 2304 91     Resp 10/26/17 2304 (!) 22     Temp 10/26/17 2304 98.5 F (36.9 C)     Temp Source 10/26/17 2304 Oral     SpO2 10/26/17 2304 96 %     Weight 10/26/17 2305 248 lb (112.5 kg)     Height 10/26/17 2305 6\' 1"  (1.854 m)     Head Circumference --      Peak Flow --      Pain Score 10/26/17 2304 4     Pain Loc --      Pain Edu? --      Excl. in [place]? --     Constitutional: Alert and oriented. Well appearing and in mild distress. Eyes: Conjunctivae are normal. PERRL.  EOMI. Head: Atraumatic. Nose: No congestion/rhinnorhea. Mouth/Throat: Mucous membranes are moist.  Oropharynx non-erythematous. Cardiovascular: Normal rate, regular rhythm. Grossly normal heart sounds.  Good peripheral circulation. Respiratory: Normal respiratory effort.  No retractions. Lungs CTAB. Gastrointestinal: Soft and nontender. No distention.  Positive bowel sounds Musculoskeletal: No lower extremity tenderness nor edema.   Neurologic:  Normal speech and language.  Skin:  Skin is warm, dry and intact.  Psychiatric: Mood and affect are normal.   ____________________________________________   LABS (all labs ordered are listed, but only abnormal results are displayed)  Labs  Reviewed  CBC - Abnormal; Notable for the following components:      Result Value   RBC 4.09 (*)    Hemoglobin 11.8 (*)    HCT 34.8 (*)    RDW 16.2 (*)    All other components within normal limits  COMPREHENSIVE METABOLIC PANEL - Abnormal; Notable for the following components:   Sodium 134 (*)    Potassium 3.3 (*)    Chloride 98 (*)    Glucose, Bld 118 (*)    BUN 37 (*)    Creatinine, Ser 6.63 (*)    Calcium 8.6 (*)    Albumin 3.4 (*)    ALT 12 (*)    GFR calc non Af Amer 10 (*)    GFR calc Af Amer 11 (*)    All other components within normal limits  TROPONIN I - Abnormal; Notable for the following components:   Troponin I 0.12 (*)    All other components within normal limits  BRAIN NATRIURETIC PEPTIDE - Abnormal; Notable for the following components:   B Natriuretic Peptide 2,478.0 (*)    All other components within normal limits  LIPASE, BLOOD  BASIC METABOLIC PANEL  TROPONIN I  TROPONIN I  TROPONIN I   ____________________________________________  EKG  ED ECG REPORT I, Loney Hering, the attending physician, personally viewed and interpreted this ECG.   Date: 10/26/2017  EKG Time: 2304  Rate: 92  Rhythm: normal sinus rhythm  Axis: normal  Intervals:none  ST&T Change:  Flipped T waves in leads I, aVL, V6, seen on EKG from October 2018.  ____________________________________________  RADIOLOGY  Dg Chest 2 View  Result Date: 10/26/2017 CLINICAL DATA:  Vomiting, nasal congestion, shortness of breath and chest pain. History of end-stage renal disease on dialysis. EXAM: CHEST  2 VIEW COMPARISON:  Chest radiograph September 03, 2017 FINDINGS: Cardiac silhouette is moderate to severely enlarged, unchanged. Pulmonary vascular congestion with coarsened interstitium. Patchy perihilar airspace opacities. No pleural effusion. Bibasilar Kerley B-lines. No pneumothorax. Soft tissue planes and included osseous structures are nonsuspicious. IMPRESSION: Stable cardiomegaly with interstitial edema. Perihilar confluent edema, less likely pneumonia. Electronically Signed   By: Elon Alas M.D.   On: 10/26/2017 23:53    ____________________________________________   PROCEDURES  Procedure(s) performed: None  Procedures  Critical Care performed: No  ____________________________________________   INITIAL IMPRESSION / ASSESSMENT AND PLAN / ED COURSE  As part of my medical decision making, I reviewed the following data within the electronic MEDICAL RECORD NUMBER Notes from prior ED visits and [place] Controlled Substance Database   This is a 31 year old male who comes into the hospital today with some nausea vomiting chest pain and shortness of breath.  The patient is a dialysis patient.  His blood pressure is elevated.  He told EMS that his blood pressure normally runs 240/120 but he has been vomiting and he is unsure if he has kept on his medications.  My differential diagnosis includes fluid overload, and STEMI, gastroenteritis, upper respiratory infection, pneumonia.  I will check some blood work on the patient to include a CBC CMP troponin lipase.  I will also check a chest x-ray.  I will give the patient a dose of Zofran and he will be reassessed once have received his  results.  His chest pain could be due from pleurisy as it is lateral and he does not have any pleuritic component to his pain.  He will be reassessed.     The patient's blood work returned  and his BNP was elevated as well as his troponin.  Although he has end-stage renal disease it is slightly more elevated than normal.  The patient also had some pulmonary edema on x-ray.  While the patient was sleeping his O2 saturations did decrease into the high 80s.  I feel that the patient has some fluid overload that is causing his chest pain and his shortness of breath.  I contacted the admitting physician to admit the patient to the hospitalist service.  He did receive a Nitropaste to his chest as well as a dose of hydralazine.  He will be admitted. ____________________________________________   FINAL CLINICAL IMPRESSION(S) / ED DIAGNOSES  Final diagnoses:  Acute pulmonary edema ([place])  Hypertension, unspecified type  Shortness of breath     ED Discharge Orders    None       Note:  This document was prepared using Dragon voice recognition software and may include unintentional dictation errors.    Loney Hering, MD 10/27/17 [phone]

## 2017-10-26 NOTE — ED Notes (Signed)
Unsuccessful IV start RAC x1

## 2017-10-27 ENCOUNTER — Other Ambulatory Visit: Payer: Self-pay

## 2017-10-27 DIAGNOSIS — E8770 Fluid overload, unspecified: Secondary | ICD-10-CM | POA: Diagnosis present

## 2017-10-27 DIAGNOSIS — I248 Other forms of acute ischemic heart disease: Secondary | ICD-10-CM | POA: Diagnosis not present

## 2017-10-27 LAB — BASIC METABOLIC PANEL
Anion gap: 11 (ref 5–15)
BUN: 37 mg/dL — AB (ref 6–20)
CHLORIDE: 100 mmol/L — AB (ref 101–111)
CO2: 25 mmol/L (ref 22–32)
CREATININE: 6.67 mg/dL — AB (ref 0.61–1.24)
Calcium: 8.7 mg/dL — ABNORMAL LOW (ref 8.9–10.3)
GFR calc Af Amer: 11 mL/min — ABNORMAL LOW (ref >60)
GFR calc non Af Amer: 10 mL/min — ABNORMAL LOW (ref >60)
GLUCOSE: 124 mg/dL — AB (ref 65–99)
Potassium: 3.2 mmol/L — ABNORMAL LOW (ref 3.5–5.1)
Sodium: 136 mmol/L (ref 135–145)

## 2017-10-27 LAB — TROPONIN I
Troponin I: 0.13 ng/mL (ref <0.03)
Troponin I: 0.11 ng/mL (ref <0.03)
Troponin I: 0.12 ng/mL (ref <0.03)

## 2017-10-27 LAB — MAGNESIUM: MAGNESIUM: 2.2 mg/dL (ref 1.7–2.4)

## 2017-10-27 LAB — PHOSPHORUS: Phosphorus: 3.7 mg/dL (ref 2.5–4.6)

## 2017-10-27 MED ORDER — ACETAMINOPHEN 650 MG RE SUPP: 650.0000 mg | Freq: Four times a day (QID) | RECTAL | Status: DC | PRN

## 2017-10-27 MED ORDER — ALTEPLASE 2 MG IJ SOLR: 2.0000 mg | Freq: Once | INTRAMUSCULAR | Status: DC | PRN

## 2017-10-27 MED ORDER — CARVEDILOL 25 MG PO TABS
25.0000 mg | ORAL_TABLET | Freq: Two times a day (BID) | ORAL | Status: DC
  Administered 2017-10-27 (×2): 25 mg via ORAL
  Filled 2017-10-27 (×2): qty 1

## 2017-10-27 MED ORDER — HEPARIN SODIUM (PORCINE) 5000 UNIT/ML IJ SOLN
5000.0000 [IU] | Freq: Three times a day (TID) | INTRAMUSCULAR | Status: DC
  Administered 2017-10-27 (×2): 5000 [IU] via SUBCUTANEOUS
  Filled 2017-10-27 (×2): qty 1

## 2017-10-27 MED ORDER — ASPIRIN EC 81 MG PO TBEC
81.0000 mg | DELAYED_RELEASE_TABLET | Freq: Every day | ORAL | Status: DC
Start: 2017-10-27 — End: 2017-10-27
  Administered 2017-10-27: 81 mg via ORAL
  Filled 2017-10-27 (×2): qty 1

## 2017-10-27 MED ORDER — SENNOSIDES-DOCUSATE SODIUM 8.6-50 MG PO TABS: 1.0000 | ORAL_TABLET | Freq: Every evening | ORAL | Status: DC | PRN

## 2017-10-27 MED ORDER — SODIUM CHLORIDE 0.9 % IV SOLN: 100.0000 mL | INTRAVENOUS | Status: DC | PRN

## 2017-10-27 MED ORDER — LABETALOL HCL 5 MG/ML IV SOLN
20.0000 mg | Freq: Once | INTRAVENOUS | Status: AC
  Administered 2017-10-27: 20 mg via INTRAVENOUS
  Filled 2017-10-27: qty 4

## 2017-10-27 MED ORDER — NITROGLYCERIN 2 % TD OINT
1.0000 [in_us] | TOPICAL_OINTMENT | Freq: Once | TRANSDERMAL | Status: AC
  Administered 2017-10-27: 1 [in_us] via TOPICAL
  Filled 2017-10-27: qty 1

## 2017-10-27 MED ORDER — PREMIER PROTEIN SHAKE: 11.0000 [oz_av] | Freq: Two times a day (BID) | ORAL | Status: DC

## 2017-10-27 MED ORDER — SODIUM CHLORIDE 0.9% FLUSH: 3.0000 mL | INTRAVENOUS | Status: DC | PRN

## 2017-10-27 MED ORDER — PENTAFLUOROPROP-TETRAFLUOROETH EX AERO
1.0000 "application " | INHALATION_SPRAY | CUTANEOUS | Status: DC | PRN
  Filled 2017-10-27: qty 30

## 2017-10-27 MED ORDER — ONDANSETRON HCL 4 MG/2ML IJ SOLN
4.0000 mg | Freq: Four times a day (QID) | INTRAMUSCULAR | Status: DC | PRN
  Administered 2017-10-27: 4 mg via INTRAVENOUS
  Filled 2017-10-27: qty 2

## 2017-10-27 MED ORDER — SODIUM CHLORIDE 0.9 % IV SOLN: 250.0000 mL | INTRAVENOUS | Status: DC | PRN

## 2017-10-27 MED ORDER — AMLODIPINE BESYLATE 5 MG PO TABS
10.0000 mg | ORAL_TABLET | Freq: Every evening | ORAL | Status: DC
  Administered 2017-10-27: 10 mg via ORAL
  Filled 2017-10-27: qty 2

## 2017-10-27 MED ORDER — SODIUM CHLORIDE 0.9% FLUSH
3.0000 mL | Freq: Two times a day (BID) | INTRAVENOUS | Status: DC
  Administered 2017-10-27 (×2): 3 mL via INTRAVENOUS

## 2017-10-27 MED ORDER — HYDRALAZINE HCL 50 MG PO TABS
50.0000 mg | ORAL_TABLET | Freq: Two times a day (BID) | ORAL | Status: DC
  Administered 2017-10-27: 50 mg via ORAL
  Filled 2017-10-27 (×2): qty 1

## 2017-10-27 MED ORDER — HEPARIN SODIUM (PORCINE) 1000 UNIT/ML DIALYSIS
1000.0000 [IU] | INTRAMUSCULAR | Status: DC | PRN
  Filled 2017-10-27: qty 1

## 2017-10-27 MED ORDER — LIDOCAINE HCL (PF) 1 % IJ SOLN
5.0000 mL | INTRAMUSCULAR | Status: DC | PRN
  Filled 2017-10-27: qty 5

## 2017-10-27 MED ORDER — RENA-VITE PO TABS
1.0000 | ORAL_TABLET | Freq: Every day | ORAL | Status: DC
  Filled 2017-10-27: qty 1

## 2017-10-27 MED ORDER — HYDRALAZINE HCL 20 MG/ML IJ SOLN
10.0000 mg | Freq: Once | INTRAMUSCULAR | Status: AC
  Administered 2017-10-27: 10 mg via INTRAVENOUS
  Filled 2017-10-27: qty 1

## 2017-10-27 MED ORDER — AMLODIPINE BESYLATE 5 MG PO TABS: 10.0000 mg | ORAL_TABLET | Freq: Every evening | ORAL | Status: DC

## 2017-10-27 MED ORDER — ACETAMINOPHEN 325 MG PO TABS
650.0000 mg | ORAL_TABLET | Freq: Four times a day (QID) | ORAL | Status: DC | PRN
Start: 2017-10-27 — End: 2017-10-27
  Administered 2017-10-27: 650 mg via ORAL
  Filled 2017-10-27: qty 2

## 2017-10-27 MED ORDER — CLONIDINE HCL 0.1 MG PO TABS: 0.2000 mg | ORAL_TABLET | Freq: Three times a day (TID) | ORAL | Status: DC

## 2017-10-27 MED ORDER — OXYCODONE-ACETAMINOPHEN 5-325 MG PO TABS
2.0000 | ORAL_TABLET | Freq: Four times a day (QID) | ORAL | Status: DC | PRN
  Administered 2017-10-27 (×3): 2 via ORAL
  Filled 2017-10-27 (×3): qty 2

## 2017-10-27 MED ORDER — LOSARTAN POTASSIUM 50 MG PO TABS
100.0000 mg | ORAL_TABLET | Freq: Every evening | ORAL | Status: DC
Start: 2017-10-27 — End: 2017-10-27

## 2017-10-27 MED ORDER — LIDOCAINE-PRILOCAINE 2.5-2.5 % EX CREA
1.0000 "application " | TOPICAL_CREAM | CUTANEOUS | Status: DC | PRN
  Filled 2017-10-27: qty 5

## 2017-10-27 MED ORDER — CLONIDINE HCL 0.1 MG PO TABS
0.2000 mg | ORAL_TABLET | Freq: Three times a day (TID) | ORAL | Status: DC
  Administered 2017-10-27: 0.2 mg via ORAL
  Filled 2017-10-27 (×2): qty 2

## 2017-10-27 MED ORDER — ONDANSETRON HCL 4 MG PO TABS: 4.0000 mg | ORAL_TABLET | Freq: Four times a day (QID) | ORAL | Status: DC | PRN

## 2017-10-27 MED ORDER — HYDRALAZINE HCL 20 MG/ML IJ SOLN
10.0000 mg | INTRAMUSCULAR | Status: DC | PRN
  Administered 2017-10-27: 10 mg via INTRAVENOUS
  Filled 2017-10-27: qty 1

## 2017-10-27 NOTE — H&P (Signed)
[facility] [place] NAME: Alan Gross    MR#:  [id number]  DATE OF BIRTH:  12-10-1985  DATE OF ADMISSION:  10/26/2017  PRIMARY CARE PHYSICIAN: Patient, No Pcp Per   REQUESTING/REFERRING PHYSICIAN:   CHIEF COMPLAINT:   Chief Complaint  Patient presents with  . Shortness of Breath  . Emesis  . Chest Pain  . Nasal Congestion    HISTORY OF PRESENT ILLNESS: Alan Gross  is a 31 y.o. male with a known history of anxiety disorder, depression, end-stage renal disease on dialysis, hypertension, CVA in the past presented to the emergency room with shortness of breath and also chest discomfort. Patient gets dialyzed on Monday Wednesday Friday. Patient felt short of breath and came to the emergency room. He did not miss any dialysis. Has chest discomfort which is aching in nature 2 out of 10 on a scale of 1-10. He was evaluated in the emergency room. His chest x-ray showed congestion and volume overload. BNP was also elevated. Hospitalist service was consulted further care. No fever or chills and cough.  PAST MEDICAL HISTORY:   Past Medical History:  Diagnosis Date  . Anxiety   . Depression   . Dyspnea   . ESRD (end stage renal disease) ([place])   . H/O cardiac catheterization   . History of febrile seizure   . Hypertension   . Irregular heart beats   . Kidney failure    M,W,F [facility] in [place]; 15 %   . Stroke (cerebrum) ([facility])    no residual effects    PAST SURGICAL HISTORY:  Past Surgical History:  Procedure Laterality Date  . A/V FISTULAGRAM N/A 05/07/2017   Procedure: A/V Fistulagram;  Surgeon: Algernon Huxley, MD;  Location: [facility];  Service: Cardiovascular;  Laterality: N/A;  . AV FISTULA PLACEMENT Left 09/10/2016   Procedure: LEFT ARTERIOVENOUS (AV) FISTULA CREATION;  Surgeon: Conrad , MD;  Location: [facility];  Service: Vascular;  Laterality: Left;  . CARDIAC CATHETERIZATION Left 05/02/2015   Procedure: Left  Heart Cath and Coronary Angiography;  Surgeon: Dionisio David, MD;  Location: [facility];  Service: Cardiovascular;  Laterality: Left;  . CORONARY ANGIOPLASTY    . PERIPHERAL VASCULAR CATHETERIZATION N/A 07/11/2016   Procedure: Dialysis/Perma Catheter Insertion;  Surgeon: Algernon Huxley, MD;  Location: [facility];  Service: Cardiovascular;  Laterality: N/A;  . PERIPHERAL VASCULAR THROMBECTOMY Left 05/07/2017   Procedure: Peripheral Vascular Thrombectomy;  Surgeon: Algernon Huxley, MD;  Location: [facility];  Service: Cardiovascular;  Laterality: Left;    SOCIAL HISTORY:  Social History   Tobacco Use  . Smoking status: Current Every Day Smoker    Packs/day: 0.25    Years: 10.00    Pack years: 2.50    Types: Cigarettes    Last attempt to quit: 06/22/2016    Years since quitting: 1.3  . Smokeless tobacco: Never Used  Substance Use Topics  . Alcohol use: No    FAMILY HISTORY:  Family History  Problem Relation Age of Onset  . Hypertension Other   . Cancer Father        liver  . Hypertension Brother   . Diabetes Paternal Aunt   . Diabetes Paternal Uncle     DRUG ALLERGIES:  Allergies  Allergen Reactions  . Lasix [Furosemide] Shortness Of Breath, Swelling and Anxiety    REVIEW OF SYSTEMS:   CONSTITUTIONAL: No fever, fatigue or  weakness.  EYES: No blurred or double vision.  EARS, NOSE, AND THROAT: No tinnitus or ear pain.  RESPIRATORY: No cough, has shortness of breath,  No wheezing or hemoptysis.  CARDIOVASCULAR: Had chest pain,  No orthopnea,  No edema.  GASTROINTESTINAL: No nausea, vomiting, diarrhea or abdominal pain.  GENITOURINARY: No dysuria, hematuria.  ENDOCRINE: No polyuria, nocturia,  HEMATOLOGY: No anemia, easy bruising or bleeding SKIN: No rash or lesion. MUSCULOSKELETAL: No joint pain or arthritis.   NEUROLOGIC: No tingling, numbness, weakness.  PSYCHIATRY: No anxiety or depression.   MEDICATIONS AT HOME:  Prior to Admission  medications   Medication Sig Start Date End Date Taking? Authorizing Provider  amLODipine (NORVASC) 10 MG tablet Take 1 tablet (10 mg total) by mouth every evening. 07/10/17   Awilda Bill, NP  aspirin 81 MG tablet Take 1 tablet (81 mg total) by mouth daily. 09/04/17   Gladstone Lighter, MD  carvedilol (COREG) 25 MG tablet Take 1 tablet (25 mg total) by mouth 2 (two) times daily with a meal. 07/10/17   Awilda Bill, NP  cloNIDine (CATAPRES) 0.2 MG tablet Take 1 tablet (0.2 mg total) by mouth 3 (three) times daily. 09/04/17   Gladstone Lighter, MD  hydrALAZINE (APRESOLINE) 50 MG tablet Take 1 tablet (50 mg total) by mouth 2 (two) times daily. 09/04/17   Gladstone Lighter, MD  losartan (COZAAR) 100 MG tablet Take 1 tablet (100 mg total) by mouth every evening. 09/04/17   Gladstone Lighter, MD      PHYSICAL EXAMINATION:   VITAL SIGNS: Blood pressure (!) 212/147, pulse (!) 102, temperature 99 F (37.2 C), temperature source Oral, resp. rate 18, height 6\' 1"  (1.854 m), weight 114 kg (251 lb 4.8 oz), SpO2 95 %.  GENERAL:  31 y.o.-year-old patient lying in the bed with no acute distress.  EYES: Pupils equal, round, reactive to light and accommodation. No scleral icterus. Extraocular muscles intact.  HEENT: Head atraumatic, normocephalic. Oropharynx and nasopharynx clear.  NECK:  Supple, no jugular venous distention. No thyroid enlargement, no tenderness.  LUNGS: Decreased breath sounds bilaterally, bibasilar crepitations heard. No use of accessory muscles of respiration.  CARDIOVASCULAR: S1, S2 normal. No murmurs, rubs, or gallops.  ABDOMEN: Soft, nontender, nondistended. Bowel sounds present. No organomegaly or mass.  EXTREMITIES: No pedal edema, cyanosis, or clubbing.  NEUROLOGIC: Cranial nerves II through XII are intact. Muscle strength 5/5 in all extremities. Sensation intact. Gait not checked.  PSYCHIATRIC: The patient is alert and oriented x 3.  SKIN: No obvious rash, lesion, or  ulcer.   LABORATORY PANEL:   CBC Recent Labs  Lab 10/26/17 2310  WBC 7.4  HGB 11.8*  HCT 34.8*  PLT 150  MCV 85.2  MCH 28.9  MCHC 33.9  RDW 16.2*   ------------------------------------------------------------------------------------------------------------------  Chemistries  Recent Labs  Lab 10/26/17 2310  NA 134*  K 3.3*  CL 98*  CO2 24  GLUCOSE 118*  BUN 37*  CREATININE 6.63*  CALCIUM 8.6*  AST 17  ALT 12*  ALKPHOS 66  BILITOT 0.7   ------------------------------------------------------------------------------------------------------------------ estimated creatinine clearance is 21 mL/min (A) (by C-G formula based on SCr of 6.63 mg/dL (H)). ------------------------------------------------------------------------------------------------------------------ No results for input(s): TSH, T4TOTAL, T3FREE, THYROIDAB in the last 72 hours.  Invalid input(s): FREET3   Coagulation profile No results for input(s): INR, PROTIME in the last 168 hours. ------------------------------------------------------------------------------------------------------------------- No results for input(s): DDIMER in the last 72 hours. -------------------------------------------------------------------------------------------------------------------  Cardiac Enzymes Recent Labs  Lab 10/26/17 2310  TROPONINI 0.12*   ------------------------------------------------------------------------------------------------------------------  Invalid input(s): POCBNP  ---------------------------------------------------------------------------------------------------------------  Urinalysis    Component Value Date/Time   COLORURINE YELLOW (A) 05/03/2015 0046   APPEARANCEUR CLEAR (A) 05/03/2015 0046   APPEARANCEUR Cloudy 10/20/2012 1158   LABSPEC 1.013 05/03/2015 0046   LABSPEC 1.019 10/20/2012 1158   PHURINE 6.0 05/03/2015 0046   GLUCOSEU 50 (A) 05/03/2015 0046   GLUCOSEU Negative  10/20/2012 1158   HGBUR NEGATIVE 05/03/2015 0046   BILIRUBINUR NEGATIVE 05/03/2015 0046   BILIRUBINUR Negative 10/20/2012 1158   KETONESUR NEGATIVE 05/03/2015 0046   PROTEINUR NEGATIVE 05/03/2015 0046   NITRITE NEGATIVE 05/03/2015 0046   LEUKOCYTESUR NEGATIVE 05/03/2015 0046   LEUKOCYTESUR 3+ 10/20/2012 1158     RADIOLOGY: Dg Chest 2 View  Result Date: 10/26/2017 CLINICAL DATA:  Vomiting, nasal congestion, shortness of breath and chest pain. History of end-stage renal disease on dialysis. EXAM: CHEST  2 VIEW COMPARISON:  Chest radiograph September 03, 2017 FINDINGS: Cardiac silhouette is moderate to severely enlarged, unchanged. Pulmonary vascular congestion with coarsened interstitium. Patchy perihilar airspace opacities. No pleural effusion. Bibasilar Kerley B-lines. No pneumothorax. Soft tissue planes and included osseous structures are nonsuspicious. IMPRESSION: Stable cardiomegaly with interstitial edema. Perihilar confluent edema, less likely pneumonia. Electronically Signed   By: Elon Alas M.D.   On: 10/26/2017 23:53    EKG: Orders placed or performed during the hospital encounter of 10/26/17  . EKG 12-Lead  . EKG 12-Lead  . ED EKG  . ED EKG    IMPRESSION AND PLAN: 31 year old male patient with history of end-stage renal disease on dialysis, hypertension, anxiety disorder, CVA presented to the emergency room for shortness of breath and chest pain.  Admitting diagnosis 1. Fluid overload 2. End-stage renal disease on dialysis 3. Uncontrolled hypertension 4. Elevated troponin could be secondary to renal failure Treatment plan Admit patient to telemetry Nephrology consultation for dialysis Cycle troponin to rule out ischemia DVT prophylaxis subcutaneous heparin Control blood pressure with oral hydralazine, when necessary IV hydralazine beta blocker and calcium channel blocker Continue aspirin  All the records are reviewed and case discussed with ED  provider. Management plans discussed with the patient, family and they are in agreement.  CODE STATUS:FULL CODE    Code Status Orders  (From admission, onward)        Start     Ordered   10/27/17 0232  Full code  Continuous     10/27/17 0231    Code Status History    Date Active Date Inactive Code Status Order ID Comments User Context   09/03/2017 21:41 09/04/2017 21:12 Full Code [id number]  Gladstone Lighter, MD Inpatient   07/09/2017 11:03 07/10/2017 19:14 Full Code [id number]  Epifanio Lesches, MD ED   05/07/2017 13:26 05/07/2017 17:48 Full Code [id number]  Algernon Huxley, MD Inpatient   04/01/2017 22:21 04/03/2017 15:48 Full Code [id number]  Harrie Foreman, MD Inpatient   07/09/2016 19:50 07/14/2016 14:49 Full Code [id number]  Lytle Butte, MD ED   04/16/2016 22:51 04/18/2016 20:14 Full Code [id number]  Quintella Baton, MD Inpatient   05/02/2015 19:22 05/04/2015 14:49 Full Code [id number]  Demetrios Loll, MD Inpatient   05/02/2015 15:32 05/02/2015 19:22 Full Code [id number]  Dionisio David, MD ED       TOTAL TIME TAKING CARE OF THIS PATIENT: 50 minutes.    Saundra Shelling M.D on 10/27/2017 at 2:44 AM  Between 7am to 6pm - Pager - [phone]  After 6pm go to [email or web address] - [facility]  Office  [phone]  CC: Primary care physician; Patient, No Pcp Per

## 2017-10-27 NOTE — Progress Notes (Signed)
Patient stated he vomited 15 minutes after taking the oral medications. Still very restless with the pounding headache.  PB is going up as noted in  Epic flow sheet. Dr. Estanislado Pandy notified and he ordered Labetalol 20 mg x one dose Will administer and continue to monitor.

## 2017-10-27 NOTE — Progress Notes (Signed)
Pre HD assessment  

## 2017-10-27 NOTE — Final Progress Note (Signed)
   [facility] [place] NAME: Markevious Ehmke    MR#:  [id number]  DATE OF BIRTH:  1986/11/12  DATE OF ADMISSION:  10/26/2017   ADMITTING PHYSICIAN: Saundra Shelling, MD  DATE OF DISCHARGE: 10/27/2017  8:10 PM  PRIMARY CARE PHYSICIAN: Patient, No Pcp Per   ADMISSION DIAGNOSIS:  Shortness of breath [R06.02] Acute pulmonary edema ([place]) [J81.0] Hypertension, unspecified type [I10] DISCHARGE DIAGNOSIS:  Active Problems:   Fluid overload  SECONDARY DIAGNOSIS:   Past Medical History:  Diagnosis Date  . Anxiety   . Depression   . Dyspnea   . ESRD (end stage renal disease) ([place])   . H/O cardiac catheterization   . History of febrile seizure   . Hypertension   . Irregular heart beats   . Kidney failure    M,W,F [facility] in [place]; 15 %   . Stroke (cerebrum) ([facility])    no residual effects   HOSPITAL COURSE:  31 year old male patient with history of end-stage renal disease on dialysis, hypertension, anxiety disorder, CVA presented to the emergency room for shortness of breath and chest pain. 1. Fluid overload 2. End-stage renal disease on dialysis 3. Uncontrolled hypertension 4. Elevated troponin could be secondary to renal failure He was admited to telemetry, treated with oral hydralazine, when necessary IV hydralazine beta blocker and calcium channel blocker. BP is improving. He left AMA. DISCHARGE CONDITIONS:  The patient left AMA at 19:46.   Demetrios Loll M.D on 10/27/2017 at 8:27 PM  Between 7am to 6pm - Pager - [phone]  After 6pm go to [email or web address] - Proofreader  [facility]  Office  [phone]  CC: Primary care physician; Patient, No Pcp Per   Note: This dictation was prepared with Dragon dictation along with smaller phrase technology. Any transcriptional errors that result from this process are unintentional.

## 2017-10-27 NOTE — Consult Note (Signed)
Cardiology Consultation:   Patient ID: Alan Gross; [id number]; [birth year]   Admit date: 10/26/2017 Date of Consult: 10/27/2017  Primary Care Provider: Patient, No Pcp Per Primary Cardiologist: No primary care provider on file.  Primary Electrophysiologist:  n/a   Patient Profile:   Alan Gross is a 31 y.o. 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 Estanislado Pandy.  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  He went to dialysis on Friday.  He reports that he forgets to take his antihypertensive medications frequently with significant fluctuations in his blood pressure.  Yesterday, he had significant shortness of breath chest tightness which prompted him to come to the emergency room.  He was noted to be hypertensive on presentation with systolic blood pressure above 200.  Blood pressure improved with resuming his medications.  Troponin was noted to be elevated but remained flat.  EKG showed no ischemic changes.  The patient had prior cardiac catheterization in 2016 which was reviewed by me.  The catheterization showed normal coronary arteries and normal ejection fraction. Echocardiogram in August of this year showed normal EF with severe LVH.  Past Medical History:  Diagnosis Date  . Anxiety   . Depression   . Dyspnea   . ESRD (end stage renal disease) ([place])   . H/O cardiac catheterization   . History of febrile seizure   . Hypertension   . Irregular heart beats   . Kidney failure    M,W,F [facility] in [place]; 15 %    . Stroke (cerebrum) ([place])    no residual effects    Past Surgical History:  Procedure Laterality Date  . A/V FISTULAGRAM N/A 05/07/2017   Procedure: A/V Fistulagram;  Surgeon: Algernon Huxley, MD;  Location: [facility];  Service: Cardiovascular;  Laterality: N/A;  . AV FISTULA PLACEMENT Left 09/10/2016   Procedure: LEFT ARTERIOVENOUS (AV) FISTULA CREATION;  Surgeon: Conrad Ripon, MD;  Location: [facility];  Service: Vascular;  Laterality: Left;  . CARDIAC CATHETERIZATION Left 05/02/2015   Procedure: Left Heart Cath and Coronary Angiography;  Surgeon: Dionisio David, MD;  Location: [facility];  Service: Cardiovascular;  Laterality: Left;  . CORONARY ANGIOPLASTY    . PERIPHERAL VASCULAR CATHETERIZATION N/A 07/11/2016   Procedure: Dialysis/Perma Catheter Insertion;  Surgeon: Algernon Huxley, MD;  Location: [facility];  Service: Cardiovascular;  Laterality: N/A;  . PERIPHERAL VASCULAR THROMBECTOMY Left 05/07/2017   Procedure: Peripheral Vascular Thrombectomy;  Surgeon: Algernon Huxley, MD;  Location: [facility];  Service: Cardiovascular;  Laterality: Left;     Home Medications:  Prior to Admission medications   Medication Sig Start Date End Date Taking? Authorizing Provider  amLODipine (NORVASC) 10 MG tablet Take 1 tablet (10 mg total) by mouth every evening. 07/10/17   Awilda Bill, NP  aspirin 81 MG tablet Take 1 tablet (81 mg total) by mouth daily. 09/04/17   Gladstone Lighter, MD  carvedilol (COREG) 25 MG tablet Take 1 tablet (25 mg total) by mouth 2 (two) times daily with a meal. 07/10/17   Awilda Bill, NP  cloNIDine (CATAPRES) 0.2 MG tablet Take 1 tablet (0.2 mg total) by mouth 3 (three) times daily. 09/04/17   Gladstone Lighter, MD  hydrALAZINE (APRESOLINE) 50 MG tablet Take 1 tablet (50 mg total) by mouth 2 (two) times daily. 09/04/17   Gladstone Lighter, MD  losartan (COZAAR) 100 MG tablet Take 1 tablet (100 mg total) by mouth every evening. 09/04/17    Gladstone Lighter, MD    Inpatient Medications: Scheduled Meds: . amLODipine  10 mg Oral QPM  . aspirin EC  81 mg Oral Daily  . carvedilol  25 mg Oral BID WC  . cloNIDine  0.2 mg Oral TID  . heparin  5,000 Units Subcutaneous Q8H  . hydrALAZINE  50 mg Oral BID  . losartan  100 mg Oral QPM  . multivitamin  1 tablet Oral QHS  . protein supplement shake  11 oz Oral BID BM  . sodium chloride flush  3 mL Intravenous Q12H   Continuous Infusions: . sodium chloride    . sodium chloride    . sodium chloride     PRN Meds: sodium chloride, sodium chloride, sodium chloride, acetaminophen **OR** acetaminophen, alteplase, heparin, hydrALAZINE, lidocaine (PF), lidocaine-prilocaine, ondansetron **OR** ondansetron (ZOFRAN) IV, oxyCODONE-acetaminophen, pentafluoroprop-tetrafluoroeth, senna-docusate, sodium chloride flush  Allergies:    Allergies  Allergen Reactions  . Lasix [Furosemide] Shortness Of Breath, Swelling and Anxiety    Social History:   Social History   Socioeconomic History  . Marital status: Single    Spouse name: Not on file  . Number of children: Not on file  . Years of education: Not on file  . Highest education level: Not on file  Social Needs  . Financial resource strain: Not on file  . Food insecurity - worry: Not on file  . Food insecurity - inability: Not on file  . Transportation needs - medical: Not on file  . Transportation needs - non-medical: Not on file  Occupational History  . 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 Smoking status: Current Every Day Smoker    Packs/day: 0.25    Years: 10.00    Pack years: 2.50    Types: Cigarettes    Last attempt to quit: 06/22/2016    Years since quitting: 1.3  . Smokeless tobacco: Never Used  Substance and Sexual Activity  . Alcohol use: No  . Drug use: Yes    Types: Marijuana  . Sexual activity: Yes  Other Topics Concern  . Not on file  Social History Narrative   Active and independent at  baseline    Family History:    Family History  Problem Relation Age of Onset  . Hypertension Other   . Cancer Father        liver  . Hypertension Brother   . Diabetes Paternal Aunt   . Diabetes Paternal Uncle      ROS:  Please see the history of present illness.  ROS  All other ROS reviewed and negative.     Physical Exam/Data:   Vitals:   10/27/17 0625 10/27/17 0644 10/27/17 0653 10/27/17 0749  BP: (!) 236/136 (!) 192/110 (!) 181/91 (!) 157/95  Pulse: (!) 102 91 83 87  Resp:    17  Temp:      TempSrc:      SpO2:    95%  Weight:      Height:       No intake or output data in the 24 hours ending 10/27/17 [place]  10/26/17 2305 10/27/17 0236  Weight: 248 lb (112.5 kg) 251 lb 4.8 oz (114 kg)   Body mass index is 33.16 kg/m.  General:  Well nourished, well developed, in no acute distress HEENT: normal Lymph: no adenopathy Neck: no JVD Endocrine:  No thryomegaly Vascular: No carotid bruits; FA pulses 2+ bilaterally without bruits  Cardiac:  normal S1, S2; RRR; no murmur  Lungs:  clear to auscultation bilaterally, no wheezing, rhonchi or rales  Abd: soft, nontender, no hepatomegaly  Ext: no edema Musculoskeletal:  No deformities, BUE and BLE strength normal and equal Skin: warm and dry  Neuro:  CNs 2-12 intact, no focal abnormalities noted Psych:  Normal affect   EKG:  The EKG was personally reviewed and demonstrates: Normal sinus rhythm with LVH and repolarization abnormalities. Telemetry:  Telemetry was personally reviewed and demonstrates: Normal sinus rhythm.  Relevant CV Studies: Outlined above  Laboratory Data:  Chemistry Recent Labs  Lab 10/26/17 2310 10/27/17 0455  NA 134* 136  K 3.3* 3.2*  CL 98* 100*  CO2 24 25  GLUCOSE 118* 124*  BUN 37* 37*  CREATININE 6.63* 6.67*  CALCIUM 8.6* 8.7*  GFRNONAA 10* 10*  GFRAA 11* 11*  ANIONGAP 12 11    Recent Labs  Lab 10/26/17 2310  PROT 6.5  ALBUMIN 3.4*  AST 17  ALT 12*  ALKPHOS  66  BILITOT 0.7   Hematology Recent Labs  Lab 10/26/17 2310  WBC 7.4  RBC 4.09*  HGB 11.8*  HCT 34.8*  MCV 85.2  MCH 28.9  MCHC 33.9  RDW 16.2*  PLT 150   Cardiac Enzymes Recent Labs  Lab 10/26/17 2310 10/27/17 0455 10/27/17 1101  TROPONINI 0.12* 0.13* 0.11*   No results for input(s): TROPIPOC in the last 168 hours.  BNP Recent Labs  Lab 10/26/17 2310  BNP 2,478.0*    DDimer No results for input(s): DDIMER in the last 168 hours.  Radiology/Studies:  Dg Chest 2 View  Result Date: 10/26/2017 CLINICAL DATA:  Vomiting, nasal congestion, shortness of breath and chest pain. History of end-stage renal disease on dialysis. EXAM: CHEST  2 VIEW COMPARISON:  Chest radiograph September 03, 2017 FINDINGS: Cardiac silhouette is moderate to severely enlarged, unchanged. Pulmonary vascular congestion with coarsened interstitium. Patchy perihilar airspace opacities. No pleural effusion. Bibasilar Kerley B-lines. No pneumothorax. Soft tissue planes and included osseous structures are nonsuspicious. IMPRESSION: Stable cardiomegaly with interstitial edema. Perihilar confluent edema, less likely pneumonia. Electronically Signed   By: Elon Alas M.D.   On: 10/26/2017 23:53    Assessment and Plan:   1. Elevated troponin likely due to demand ischemia in the setting of hypertensive heart disease severely elevated blood pressure.  No evidence of acute coronary syndrome.  Prior cardiac catheterization in 2016 showed normal coronary arteries.  No further ischemic cardiac evaluation is recommended.  Recommend blood pressure control.  I suspect that compliance is the culprit for significant fluctuation of his blood pressure. 2. Essential hypertension: Blood pressure is uncontrolled due to compliance.  Blood pressure improved here while hospitalized.  Continue same medications. 3. End-stage renal disease on hemodialysis management per nephrology.   For questions or updates, please contact [facility] Please consult [email or web address] for contact info under Cardiology/STEMI.   Signed, Kathlyn Sacramento, MD  10/27/2017 2:33 PM

## 2017-10-27 NOTE — Progress Notes (Signed)
BP=181/91 after the Labetalol 20 mg IV. Patient verbalized his headache  has eased off  (pain level of 8/10) . Will continue to monitor.

## 2017-10-27 NOTE — Progress Notes (Signed)
Patient is complaining of pounding headache  of 10/10 on the pain scale. BP  is  now 202/140 after the Hydralazine 50 mg oral. Patient has received 10 mg  IV Hydralazine PRN. Dr Estanislado Pandy notified with a new order to move all his morning BP medications and administered them now with the exception of oral hydralazine administered at 0317. Also received order for Percocet 5/325 mg oral 2 tabs  for moderate to severe pain. Will implement orders and continue to monitor.

## 2017-10-27 NOTE — Progress Notes (Signed)
The patient complained headache. Bp is better. VS reviewed. Physical exam is unremarkable. Continue current treatment.  I discussed with RN and CM. Time spent: 30 minutes.

## 2017-10-27 NOTE — Progress Notes (Signed)
HD tx end  

## 2017-10-27 NOTE — Progress Notes (Signed)
Initial Nutrition Assessment  DOCUMENTATION CODES:   Obesity unspecified  INTERVENTION:   Premier Protein BID, each supplement provides 160 kcal and 30 grams of protein.   Renal MVI  NUTRITION DIAGNOSIS:   Increased nutrient needs related to catabolic illness(ESRD on HD, CHF) as evidenced by increased estimated needs from protein.  GOAL:   Patient will meet greater than or equal to 90% of their needs  MONITOR:   PO intake, Supplement acceptance, Labs, I & O's, Weight trends  REASON FOR ASSESSMENT:   Malnutrition Screening Tool    ASSESSMENT:   31 y.o. male with a known history of anxiety disorder, depression, end-stage renal disease on dialysis, hypertension, CVA in the past presented to the emergency room with shortness of breath and also chest discomfort.   Met with pt in room today. Pt reports poor appetite and oral intake for the past several days. Pt currently eating only popcicles. Pt with nausea and vomiting today. Per chart, pt is weight stable. Pt does not drink supplements at home or take a daily MVI. RD discussed the importance of adequate nutrition with HD. RD will order Premier Protein and daily MVI.    Medications reviewed and include: aspirin, heparin, zofran, oxycodone   Labs reviewed: K 3.2(L), Cl 100(L), BUN 37(H), creat 6.67(H), Ca 8.7(L), P 3.7 wnl BNP- 2478(H)- 12/9 PTH- 242(H)- 5/16  Nutrition-Focused physical exam completed. Findings are no fat depletion, no muscle depletion, and no edema.   Diet Order:  Diet renal/carb modified with fluid restriction Diet-HS Snack? Nothing; Room service appropriate? Yes; Fluid consistency: Thin  EDUCATION NEEDS:   Education needs have been addressed  Skin:  Skin Assessment: Reviewed RN Assessment  Last BM:  12/9  Height:   Ht Readings from Last 1 Encounters:  10/27/17 _0  (1.854 m)    Weight:   Wt Readings from Last 1 Encounters:  10/27/17 251 lb 4.8 oz (114 kg)    Ideal Body Weight:  83.6  kg  BMI:  Body mass index is 33.16 kg/m.  Estimated Nutritional Needs:   Kcal:  2300-2600kcal/day   Protein:  114-136g/day   Fluid:  per MD  Koleen Distance MS, RD, LDN Pager #[phone] After Hours Pager: [phone]

## 2017-10-27 NOTE — ED Notes (Signed)
Pt updated on care plan. Lights dimmed for comfort.

## 2017-10-27 NOTE — Progress Notes (Signed)
HD tx start 

## 2017-10-27 NOTE — ED Notes (Signed)
Critical troponin of 0.12 called from lab. Dr. Dahlia Client notified.

## 2017-10-27 NOTE — Progress Notes (Signed)
Post HD assessment  

## 2017-10-27 NOTE — Progress Notes (Signed)
The patient is admitted to room 260 with the diagnosis of fluid overload. Alert and oriented x 4. Patient oriented to his room, ascom/call bell. Skin assessment done with Bud Face RN, no skin issues to report. Patient is complaining of  headache and prefers Tylenol. Tylenol will be administered after the pharmacy verification. Will continue to monitor.

## 2017-10-27 NOTE — ED Notes (Signed)
Pt placed on oxygen at 2lpm via [place] for pox of 90% while sleeping.

## 2017-10-27 NOTE — Progress Notes (Signed)
Pt is having runs of bi and trigemmany. Pt also request discharge after HD. MD notified. Acknowledged, no new orders. I will continue to assess.

## 2017-10-27 NOTE — Progress Notes (Signed)
[facility]  ROUNDING NOTE   Subjective:  Patient well-known to [place] from prior hospitalizations. He comes in now with shortness of breath which occurred yesterday. Patient has also had elevated blood pressure. In addition he has had significant headache as well as nausea and vomiting that did not allow him to keep his antihypertensives down. Dialysis has been scheduled for the patient.   Objective:  Vital signs in last 24 hours:  Temp:  [98.5 F (36.9 C)-99 F (37.2 C)] 98.9 F (37.2 C) (12/10 0459) Pulse Rate:  [82-103] 87 (12/10 0749) Resp:  [17-25] 17 (12/10 0749) BP: (157-236)/(91-151) 157/95 (12/10 0749) SpO2:  [88 %-96 %] 95 % (12/10 0749) Weight:  [112.5 kg (248 lb)-114 kg (251 lb 4.8 oz)] 114 kg (251 lb 4.8 oz) (12/10 0236)  Weight change:  Filed Weights   10/26/17 2305 10/27/17 0236  Weight: 112.5 kg (248 lb) 114 kg (251 lb 4.8 oz)    Intake/Output: No intake/output data recorded.   Intake/Output this shift:  No intake/output data recorded.  Physical Exam: General: No acute distress  Head: Normocephalic, atraumatic. Moist oral mucosal membranes  Eyes: Anicteric  Neck: Supple, trachea midline  Lungs:   Minimal basilar rales, normal effort  Heart: S1S2 no rubs  Abdomen:  Soft, nontender, bowel sounds present  Extremities: Trace peripheral edema.  Neurologic: Awake, alert, following commands  Skin: No lesions  Access: LUE AVF    Basic Metabolic Panel: Recent Labs  Lab 10/26/17 2310 10/27/17 0455 10/27/17 1101  NA 134* 136  --   K 3.3* 3.2*  --   CL 98* 100*  --   CO2 24 25  --   GLUCOSE 118* 124*  --   BUN 37* 37*  --   CREATININE 6.63* 6.67*  --   CALCIUM 8.6* 8.7*  --   PHOS  --   --  3.7    Liver Function Tests: Recent Labs  Lab 10/26/17 2310  AST 17  ALT 12*  ALKPHOS 66  BILITOT 0.7  PROT 6.5  ALBUMIN 3.4*   Recent Labs  Lab 10/26/17 2310  LIPASE 26   No results for input(s): AMMONIA in the last 168  hours.  CBC: Recent Labs  Lab 10/26/17 2310  WBC 7.4  HGB 11.8*  HCT 34.8*  MCV 85.2  PLT 150    Cardiac Enzymes: Recent Labs  Lab 10/26/17 2310 10/27/17 0455 10/27/17 1101  TROPONINI 0.12* 0.13* 0.11*    BNP: Invalid input(s): POCBNP  CBG: No results for input(s): GLUCAP in the last 168 hours.  Microbiology: Results for orders placed or performed during the hospital encounter of 09/03/17  MRSA PCR Screening     Status: None   Collection Time: 09/03/17 10:30 PM  Result Value Ref Range Status   MRSA by PCR NEGATIVE NEGATIVE Final    Comment:        The GeneXpert MRSA Assay (FDA approved for NASAL specimens only), is one component of a comprehensive MRSA colonization surveillance program. It is not intended to diagnose MRSA infection nor to guide or monitor treatment for MRSA infections.     Coagulation Studies: No results for input(s): LABPROT, INR in the last 72 hours.  Urinalysis: No results for input(s): COLORURINE, LABSPEC, PHURINE, GLUCOSEU, HGBUR, BILIRUBINUR, KETONESUR, PROTEINUR, UROBILINOGEN, NITRITE, LEUKOCYTESUR in the last 72 hours.  Invalid input(s): APPERANCEUR    Imaging: Dg Chest 2 View  Result Date: 10/26/2017 CLINICAL DATA:  Vomiting, nasal congestion, shortness of breath and chest pain.  History of end-stage renal disease on dialysis. EXAM: CHEST  2 VIEW COMPARISON:  Chest radiograph September 03, 2017 FINDINGS: Cardiac silhouette is moderate to severely enlarged, unchanged. Pulmonary vascular congestion with coarsened interstitium. Patchy perihilar airspace opacities. No pleural effusion. Bibasilar Kerley B-lines. No pneumothorax. Soft tissue planes and included osseous structures are nonsuspicious. IMPRESSION: Stable cardiomegaly with interstitial edema. Perihilar confluent edema, less likely pneumonia. Electronically Signed   By: Elon Alas M.D.   On: 10/26/2017 23:53     Medications:   . sodium chloride    . sodium chloride     . sodium chloride     . amLODipine  10 mg Oral QPM  . aspirin EC  81 mg Oral Daily  . carvedilol  25 mg Oral BID WC  . cloNIDine  0.2 mg Oral TID  . heparin  5,000 Units Subcutaneous Q8H  . hydrALAZINE  50 mg Oral BID  . losartan  100 mg Oral QPM  . sodium chloride flush  3 mL Intravenous Q12H   sodium chloride, sodium chloride, sodium chloride, acetaminophen **OR** acetaminophen, alteplase, heparin, hydrALAZINE, lidocaine (PF), lidocaine-prilocaine, ondansetron **OR** ondansetron (ZOFRAN) IV, oxyCODONE-acetaminophen, pentafluoroprop-tetrafluoroeth, senna-docusate, sodium chloride flush  Assessment/ Plan:  31 y.o. African-American male with hypertension, childhood seizures, TIA, ESRD on hemodialysis since October 2017, presents with hypertensive emergency, acute shortness of breath  1. End-stage renal disease on hemodialysis MWF. [facility] nephrology. [place] 2. Hypertensive emergency/malignant HTN with elevated troponin/non-STEMI 3. Acute pulmonary edema 4. Anemia of chronic kidney disease 5. Secondary hyperparathyroidism  Plan:  Patient presents very similarly as he did in August 2018.  He has significant shortness of breath, headache, and nausea/vomiting.  He is due for hemodialysis and we have prepared orders for today.  Otherwise continue antihypertensive treatment with amlodipine, carvedilol, clonidine, hydralazine, losartan, and as needed labetalol.  Reassess blood pressure after dialysis.  Thanks for consultation.    LOS: 0 Daniqua Campoy 12/10/201812:57 PM

## 2017-10-27 NOTE — Progress Notes (Signed)
Patient signed out AMA at 31.  This RN and Tammy, RN, educated patient on importance of keeping his blood pressure under control.  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  IV and tele monitor discontinued; carvedilol administered.  This RN reiterated importance of keeping his PCP appointments, keeping his stress down, and educated him on signs and symptoms of stroke.  Patient verbalized he would go to nearest hospital if he had to.  Patient's fistula site dressing was reinforced prior to his leaving.  Patient ambulatory from unit with even, steady gait.

## 2017-10-28 LAB — PARATHYROID HORMONE, INTACT (NO CA): PTH: 130 pg/mL — ABNORMAL HIGH (ref 15–65)

## 2017-10-28 NOTE — Discharge Summary (Signed)
   [place] [facility] [place] NAME: Alan Gross    MR#:  [id number]  DATE OF BIRTH:  [birth year]  DATE OF ADMISSION:  10/26/2017   ADMITTING PHYSICIAN: Saundra Shelling, MD  DATE OF DISCHARGE: 10/27/2017  8:10 PM  PRIMARY CARE PHYSICIAN: Patient, No Pcp Per   ADMISSION DIAGNOSIS:  Shortness of breath [R06.02] Acute pulmonary edema ([place]) [J81.0] Hypertension, unspecified type [I10] DISCHARGE DIAGNOSIS:  Active Problems:   Fluid overload  SECONDARY DIAGNOSIS:   Past Medical History:  Diagnosis Date  . Anxiety   . Depression   . Dyspnea   . ESRD (end stage renal disease) ([place])   . H/O cardiac catheterization   . History of febrile seizure   . Hypertension   . Irregular heart beats   . Kidney failure    M,W,F [facility] in [place]; 15 %   . Stroke (cerebrum) ([facility])    no residual effects   HOSPITAL COURSE:  31 year old male patient with history of end-stage renal disease on dialysis, hypertension,anxiety disorder, CVA presented to the emergency room for shortness of breath and chest pain. 1.Fluid overload 2.End-stage renal disease on dialysis 3.Uncontrolled hypertension 4.Elevated troponin could be secondary to renal failure He was admited to telemetry, treated with oral hydralazine, when necessary IV hydralazine beta blocker and calcium channel blocker. BP is improving. He left AMA at 19:46.  Demetrios Loll M.D on 10/28/2017 at 1:49 PM  Between 7am to 6pm - Pager - [phone]  After 6pm go to [email or web address] - Proofreader  [facility]  Office  [phone]  CC: Primary care physician; Patient, No Pcp Per   Note: This dictation was prepared with Dragon dictation along with smaller phrase technology. Any transcriptional errors that result from this process are unintentional.

## 2018-01-13 ENCOUNTER — Inpatient Hospital Stay
Admission: EM | Admit: 2018-01-13 | Discharge: 2018-01-14 | DRG: 304 | Discharge status: Against Medical Advice | Payer: No Typology Code available for payment source | Attending: Internal Medicine | Admitting: Internal Medicine

## 2018-01-13 ENCOUNTER — Emergency Department: Payer: No Typology Code available for payment source

## 2018-01-13 DIAGNOSIS — Z992 Dependence on renal dialysis: Secondary | ICD-10-CM

## 2018-01-13 DIAGNOSIS — F329 Major depressive disorder, single episode, unspecified: Secondary | ICD-10-CM | POA: Diagnosis present

## 2018-01-13 DIAGNOSIS — Z716 Tobacco abuse counseling: Secondary | ICD-10-CM

## 2018-01-13 DIAGNOSIS — I248 Other forms of acute ischemic heart disease: Secondary | ICD-10-CM | POA: Diagnosis present

## 2018-01-13 DIAGNOSIS — I120 Hypertensive chronic kidney disease with stage 5 chronic kidney disease or end stage renal disease: Secondary | ICD-10-CM | POA: Diagnosis present

## 2018-01-13 DIAGNOSIS — N2581 Secondary hyperparathyroidism of renal origin: Secondary | ICD-10-CM | POA: Diagnosis present

## 2018-01-13 DIAGNOSIS — I161 Hypertensive emergency: Secondary | ICD-10-CM | POA: Diagnosis not present

## 2018-01-13 DIAGNOSIS — E876 Hypokalemia: Secondary | ICD-10-CM | POA: Diagnosis present

## 2018-01-13 DIAGNOSIS — Z888 Allergy status to other drugs, medicaments and biological substances status: Secondary | ICD-10-CM

## 2018-01-13 DIAGNOSIS — N186 End stage renal disease: Secondary | ICD-10-CM | POA: Diagnosis present

## 2018-01-13 DIAGNOSIS — Z8249 Family history of ischemic heart disease and other diseases of the circulatory system: Secondary | ICD-10-CM

## 2018-01-13 DIAGNOSIS — D631 Anemia in chronic kidney disease: Secondary | ICD-10-CM | POA: Diagnosis present

## 2018-01-13 DIAGNOSIS — F1290 Cannabis use, unspecified, uncomplicated: Secondary | ICD-10-CM | POA: Diagnosis present

## 2018-01-13 DIAGNOSIS — F419 Anxiety disorder, unspecified: Secondary | ICD-10-CM | POA: Diagnosis present

## 2018-01-13 DIAGNOSIS — Z7982 Long term (current) use of aspirin: Secondary | ICD-10-CM

## 2018-01-13 DIAGNOSIS — Z8673 Personal history of transient ischemic attack (TIA), and cerebral infarction without residual deficits: Secondary | ICD-10-CM

## 2018-01-13 DIAGNOSIS — D72829 Elevated white blood cell count, unspecified: Secondary | ICD-10-CM | POA: Diagnosis present

## 2018-01-13 DIAGNOSIS — Z79899 Other long term (current) drug therapy: Secondary | ICD-10-CM

## 2018-01-13 DIAGNOSIS — R112 Nausea with vomiting, unspecified: Secondary | ICD-10-CM | POA: Diagnosis present

## 2018-01-13 DIAGNOSIS — F17210 Nicotine dependence, cigarettes, uncomplicated: Secondary | ICD-10-CM | POA: Diagnosis present

## 2018-01-13 LAB — COMPREHENSIVE METABOLIC PANEL
ALBUMIN: 4.8 g/dL (ref 3.5–5.0)
ALT: 15 U/L — AB (ref 17–63)
AST: 40 U/L (ref 15–41)
Alkaline Phosphatase: 83 U/L (ref 38–126)
Anion gap: 15 (ref 5–15)
BILIRUBIN TOTAL: 1.1 mg/dL (ref 0.3–1.2)
BUN: 29 mg/dL — AB (ref 6–20)
CO2: 31 mmol/L (ref 22–32)
CREATININE: 7.58 mg/dL — AB (ref 0.61–1.24)
Calcium: 9.3 mg/dL (ref 8.9–10.3)
Chloride: 87 mmol/L — ABNORMAL LOW (ref 101–111)
GFR calc Af Amer: 10 mL/min — ABNORMAL LOW (ref >60)
GFR calc non Af Amer: 8 mL/min — ABNORMAL LOW (ref >60)
GLUCOSE: 130 mg/dL — AB (ref 65–99)
Potassium: 3.3 mmol/L — ABNORMAL LOW (ref 3.5–5.1)
Sodium: 133 mmol/L — ABNORMAL LOW (ref 135–145)
Total Protein: 8.7 g/dL — ABNORMAL HIGH (ref 6.5–8.1)

## 2018-01-13 LAB — CBC
HEMATOCRIT: 44.4 % (ref 40.0–52.0)
Hemoglobin: 14.6 g/dL (ref 13.0–18.0)
MCH: 28.5 pg (ref 26.0–34.0)
MCHC: 32.9 g/dL (ref 32.0–36.0)
MCV: 86.7 fL (ref 80.0–100.0)
PLATELETS: 169 10*3/uL (ref 150–440)
RBC: 5.12 MIL/uL (ref 4.40–5.90)
RDW: 17.4 % — ABNORMAL HIGH (ref 11.5–14.5)
WBC: 14.1 10*3/uL — AB (ref 3.8–10.6)

## 2018-01-13 LAB — URINALYSIS, COMPLETE (UACMP) WITH MICROSCOPIC
BILIRUBIN URINE: NEGATIVE
GLUCOSE, UA: 50 mg/dL — AB
HGB URINE DIPSTICK: NEGATIVE
Ketones, ur: NEGATIVE mg/dL
LEUKOCYTES UA: NEGATIVE
Nitrite: NEGATIVE
PH: 7 (ref 5.0–8.0)
Protein, ur: >=300 mg/dL — AB
SPECIFIC GRAVITY, URINE: 1.016 (ref 1.005–1.030)

## 2018-01-13 LAB — TROPONIN I: TROPONIN I: 0.21 ng/mL — AB (ref <0.03)

## 2018-01-13 LAB — LIPASE, BLOOD: Lipase: 71 U/L — ABNORMAL HIGH (ref 11–51)

## 2018-01-13 MED ORDER — MORPHINE SULFATE (PF) 2 MG/ML IV SOLN
2.0000 mg | Freq: Once | INTRAVENOUS | Status: AC
  Administered 2018-01-13: 2 mg via INTRAVENOUS
  Filled 2018-01-13: qty 1

## 2018-01-13 MED ORDER — LABETALOL HCL 5 MG/ML IV SOLN
20.0000 mg | Freq: Once | INTRAVENOUS | Status: AC
  Administered 2018-01-13: 20 mg via INTRAVENOUS
  Filled 2018-01-13: qty 4

## 2018-01-13 MED ORDER — ONDANSETRON HCL 4 MG/2ML IJ SOLN
4.0000 mg | Freq: Once | INTRAMUSCULAR | Status: AC
  Administered 2018-01-13: 4 mg via INTRAVENOUS
  Filled 2018-01-13: qty 2

## 2018-01-13 NOTE — ED Provider Notes (Signed)
[facility] Emergency Department Provider Note   First MD Initiated Contact with Patient 01/13/18 2256     (approximate)  I have reviewed the triage vital signs and the nursing notes.   HISTORY  Chief Complaint Chest Pain    HPI Alan Gross is a 32 y.o. male with below list of chronic medical conditions including chronic kidney disease with dialysis Monday Wednesday Friday presents to the emergency department with 8 out of 10 left chest discomfort associated with dyspnea times 2 days.  Patient also admits to generalized body aches and chills.  Patient states that he missed dialysis on last Friday but subsequently went on Saturday.   Past Medical History:  Diagnosis Date  . Anxiety   . Depression   . Dyspnea   . ESRD (end stage renal disease) ([place])   . H/O cardiac catheterization   . History of febrile seizure   . Hypertension   . Irregular heart beats   . Kidney failure    M,W,F [facility] in [place]; 15 %   . 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 Fluid overload 10/27/2017  . Chest pain 09/03/2017  . Elevated troponin 08/18/2017  . 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 Hypertensive urgency 04/01/2017  . ESRD (end stage renal disease) on dialysis ([facility]) 07/19/2016  . Anxiety 07/19/2016  . Heart failure, diastolic, due to HTN ([place]) 07/19/2016  . Costochondritis 07/19/2016  . HTN (hypertension) 07/09/2016    Past Surgical History:  Procedure Laterality Date  . A/V FISTULAGRAM N/A 05/07/2017   Procedure: A/V Fistulagram;  Surgeon: Algernon Huxley, MD;  Location: [facility];  Service: Cardiovascular;  Laterality: N/A;  . AV FISTULA PLACEMENT Left 09/10/2016   Procedure: LEFT ARTERIOVENOUS (AV) FISTULA CREATION;  Surgeon: Conrad [place], MD;  Location: [facility];  Service: Vascular;  Laterality: Left;  . CARDIAC CATHETERIZATION Left  05/02/2015   Procedure: Left Heart Cath and Coronary Angiography;  Surgeon: Dionisio David, MD;  Location: [place];  Service: Cardiovascular;  Laterality: Left;  . CORONARY ANGIOPLASTY    . PERIPHERAL VASCULAR CATHETERIZATION N/A 07/11/2016   Procedure: Dialysis/Perma Catheter Insertion;  Surgeon: Algernon Huxley, MD;  Location: [facility];  Service: Cardiovascular;  Laterality: N/A;  . PERIPHERAL VASCULAR THROMBECTOMY Left 05/07/2017   Procedure: Peripheral Vascular Thrombectomy;  Surgeon: Algernon Huxley, MD;  Location: [facility];  Service: Cardiovascular;  Laterality: Left;    Prior to Admission medications   Medication Sig Start Date End Date Taking? Authorizing Provider  amLODipine (NORVASC) 10 MG tablet Take 1 tablet (10 mg total) by mouth every evening. 07/10/17  Yes Awilda Bill, NP  cloNIDine (CATAPRES) 0.2 MG tablet Take 1 tablet (0.2 mg total) by mouth 3 (three) times daily. 09/04/17  Yes Gladstone Lighter, MD  hydrALAZINE (APRESOLINE) 50 MG tablet Take 1 tablet (50 mg total) by mouth 2 (two) times daily. 09/04/17  Yes Gladstone Lighter, MD  losartan (COZAAR) 100 MG tablet Take 1 tablet (100 mg total) by mouth every evening. 09/04/17  Yes Gladstone Lighter, MD  aspirin 81 MG tablet Take 1 tablet (81 mg total) by mouth daily. Patient not taking: Reported on 01/13/2018 09/04/17   Gladstone Lighter, MD  carvedilol (COREG) 25 MG tablet Take 1 tablet (25 mg total) by mouth 2 (two) times daily with a  meal. Patient not taking: Reported on 01/13/2018 07/10/17   Awilda Bill, NP    Allergies Lasix [furosemide]  Family History  Problem Relation Age of Onset  . Hypertension Other   . Cancer Father        liver  . Hypertension Brother   . Diabetes Paternal Aunt   . Diabetes Paternal Uncle     Social History Social History   Tobacco Use  . Smoking status: Current Every Day Smoker    Packs/day: 0.25    Years: 10.00    Pack years: 2.50    Types:  Cigarettes    Last attempt to quit: 06/22/2016    Years since quitting: 1.5  . Smokeless tobacco: Never Used  Substance Use Topics  . Alcohol use: No  . Drug use: Yes    Types: Marijuana    Review of Systems Constitutional: No fever/chills Eyes: No visual changes. ENT: No sore throat. Cardiovascular: Denies chest pain. Respiratory: Denies shortness of breath. Gastrointestinal: No abdominal pain.  No nausea, no vomiting.  No diarrhea.  No constipation. Genitourinary: Negative for dysuria. Musculoskeletal: Negative for neck pain.  Negative for back pain. Integumentary: Negative for rash. Neurological: Negative for headaches, focal weakness or numbness.   ____________________________________________   PHYSICAL EXAM:  VITAL SIGNS: ED Triage Vitals  Enc Vitals Group     BP 01/13/18 2234 (!) 244/166     Pulse Rate 01/13/18 2234 (!) 113     Resp 01/13/18 2234 20     Temp 01/13/18 2234 97.9 F (36.6 C)     Temp Source 01/13/18 2234 Oral     SpO2 01/13/18 2234 99 %     Weight 01/13/18 2230 106.6 kg (235 lb)     Height 01/13/18 2230 1.854 m (6\' 1" )     Head Circumference --      Peak Flow --      Pain Score 01/13/18 2230 7     Pain Loc --      Pain Edu? --      Excl. in [place]? --     Constitutional: Alert and oriented.  Apparent discomfort and respiratory difficulty  eyes: Conjunctivae are normal. PERRL. EOMI. Head: Atraumatic. Mouth/Throat: Mucous membranes are moist.  Oropharynx non-erythematous. Neck: No stridor.   Cardiovascular: Tachycardia, regular rhythm. Good peripheral circulation. Grossly normal heart sounds. Respiratory: Normal respiratory effort.  No retractions. Lungs CTAB. Gastrointestinal: Soft and nontender. No distention.  Musculoskeletal: No lower extremity tenderness nor edema. No gross deformities of extremities. Neurologic:  Normal speech and language. No gross focal neurologic deficits are appreciated.  Skin:  Skin is warm, dry and intact. No rash  noted. Psychiatric: Mood and affect are normal. Speech and behavior are normal.  ____________________________________________   LABS (all labs ordered are listed, but only abnormal results are displayed)  Labs Reviewed  CBC  COMPREHENSIVE METABOLIC PANEL  TROPONIN I  LIPASE, BLOOD  URINALYSIS, COMPLETE (UACMP) WITH MICROSCOPIC  INFLUENZA PANEL BY PCR (TYPE A & B)   ____________________________________________  EKG  ED ECG REPORT I, [place] N BROWN, the attending physician, personally viewed and interpreted this ECG.   Date: 01/15/2018  EKG Time: 10:32 PM  Rate: 121  Rhythm: Sinus tachycardia with evidence of left ventricular hypertrophy  Axis: Left axis deviation  Intervals: Normal  ST&T Change: None  ____________________________________________  RADIOLOGY I, [place] N BROWN, personally viewed and evaluated these images (plain radiographs) as part of my medical decision making, as well as reviewing the written report by  the radiologist.  ED MD interpretation: Cardiomegaly no acute cardiopulmonary process.  Official radiology report(s): Dg Chest 2 View  Result Date: 01/13/2018 CLINICAL DATA:  Shortness of breath, chest pain EXAM: CHEST  2 VIEW COMPARISON:  10/26/2017 FINDINGS: Cardiomegaly. No confluent opacities, effusions or edema. No acute bony abnormality. IMPRESSION: Cardiomegaly.  No active disease. Electronically Signed   By: Rolm Baptise M.D.   On: 01/13/2018 23:25    ____________________________________________   PROCEDURES  Critical Care performed:   .Critical Care Performed by: Gregor Hams, MD Authorized by: Gregor Hams, MD   Critical care provider statement:    Critical care time (minutes):  45   Critical care time was exclusive of:  Separately billable procedures and treating other patients and teaching time   Critical care was necessary to treat or prevent imminent or life-threatening deterioration of the following conditions:   Respiratory failure and cardiac failure   Critical care was time spent personally by me on the following activities:  Development of treatment plan with patient or surrogate, discussions with consultants, evaluation of patient's response to treatment, examination of patient, obtaining history from patient or surrogate, ordering and performing treatments and interventions, ordering and review of laboratory studies, ordering and review of radiographic studies, pulse oximetry, re-evaluation of patient's condition and review of old charts   I assumed direction of critical care for this patient from another provider in my specialty: no       ____________________________________________   INITIAL IMPRESSION / ASSESSMENT AND PLAN / ED COURSE  As part of my medical decision making, I reviewed the following data within the electronic MEDICAL RECORD NUMBER  32 year old male presenting to the emergency department above-stated history physical exam secondary to chest pain and dyspnea with severe hypertension.  Suspect impending pulmonary edema secondary to decreased cardiac output because of the patient's markedly elevated blood pressure in addition patient is also missed dialysis.  As such patient given labetalol 20 mg IV bolus with minimal improvement of blood pressure and as such that bolus was repeated again patient systolic blood pressure still exceeding 200 despite 2 IV boluses of labetalol.  Patient placed on a nitroglycerin infusion which resulted in 30% decrease in the patient's systolic blood pressure.  Patient's symptoms improved following blood pressure improvement.  Patient discussed with Dr. Marcille Blanco for hospital admission for further evaluation and management for hypertensive emergency    ____________________________________________  FINAL CLINICAL IMPRESSION(S) / ED DIAGNOSES  Final diagnoses:  Hypertensive emergency     MEDICATIONS GIVEN DURING THIS VISIT:  Medications  labetalol  (NORMODYNE,TRANDATE) injection 20 mg (not administered)  morphine 2 MG/ML injection 2 mg (not administered)  ondansetron (ZOFRAN) injection 4 mg (not administered)     ED Discharge Orders    None       Note:  This document was prepared using Dragon voice recognition software and may include unintentional dictation errors.    Gregor Hams, MD 01/15/18 [phone]

## 2018-01-13 NOTE — ED Notes (Addendum)
Pt reported that he has chest pain that is burning and tight, along with numbness in his hands. Started  Last night. Pt report HX of HTN but he cant keep his pills down because he keeps throwing up .

## 2018-01-13 NOTE — ED Triage Notes (Addendum)
PT in with co chest pain x 2 days no hx of heart disease pt is on dialysis. Pt had normal treatment Monday, states does have some shob. Pt also co vomiting multiple times since last night, chills, and bodyaches.

## 2018-01-14 ENCOUNTER — Other Ambulatory Visit: Payer: Self-pay

## 2018-01-14 DIAGNOSIS — I248 Other forms of acute ischemic heart disease: Secondary | ICD-10-CM | POA: Diagnosis present

## 2018-01-14 DIAGNOSIS — N2581 Secondary hyperparathyroidism of renal origin: Secondary | ICD-10-CM | POA: Diagnosis present

## 2018-01-14 DIAGNOSIS — Z7982 Long term (current) use of aspirin: Secondary | ICD-10-CM | POA: Diagnosis not present

## 2018-01-14 DIAGNOSIS — F419 Anxiety disorder, unspecified: Secondary | ICD-10-CM | POA: Diagnosis present

## 2018-01-14 DIAGNOSIS — Z888 Allergy status to other drugs, medicaments and biological substances status: Secondary | ICD-10-CM | POA: Diagnosis not present

## 2018-01-14 DIAGNOSIS — Z8673 Personal history of transient ischemic attack (TIA), and cerebral infarction without residual deficits: Secondary | ICD-10-CM | POA: Diagnosis not present

## 2018-01-14 DIAGNOSIS — F329 Major depressive disorder, single episode, unspecified: Secondary | ICD-10-CM | POA: Diagnosis present

## 2018-01-14 DIAGNOSIS — D72829 Elevated white blood cell count, unspecified: Secondary | ICD-10-CM | POA: Diagnosis present

## 2018-01-14 DIAGNOSIS — I161 Hypertensive emergency: Secondary | ICD-10-CM | POA: Diagnosis present

## 2018-01-14 DIAGNOSIS — Z8249 Family history of ischemic heart disease and other diseases of the circulatory system: Secondary | ICD-10-CM | POA: Diagnosis not present

## 2018-01-14 DIAGNOSIS — Z716 Tobacco abuse counseling: Secondary | ICD-10-CM | POA: Diagnosis not present

## 2018-01-14 DIAGNOSIS — N186 End stage renal disease: Secondary | ICD-10-CM | POA: Diagnosis present

## 2018-01-14 DIAGNOSIS — F17210 Nicotine dependence, cigarettes, uncomplicated: Secondary | ICD-10-CM | POA: Diagnosis present

## 2018-01-14 DIAGNOSIS — E876 Hypokalemia: Secondary | ICD-10-CM | POA: Diagnosis present

## 2018-01-14 DIAGNOSIS — Z79899 Other long term (current) drug therapy: Secondary | ICD-10-CM | POA: Diagnosis not present

## 2018-01-14 DIAGNOSIS — D631 Anemia in chronic kidney disease: Secondary | ICD-10-CM | POA: Diagnosis present

## 2018-01-14 DIAGNOSIS — R112 Nausea with vomiting, unspecified: Secondary | ICD-10-CM | POA: Diagnosis present

## 2018-01-14 DIAGNOSIS — Z992 Dependence on renal dialysis: Secondary | ICD-10-CM | POA: Diagnosis not present

## 2018-01-14 DIAGNOSIS — I120 Hypertensive chronic kidney disease with stage 5 chronic kidney disease or end stage renal disease: Secondary | ICD-10-CM | POA: Diagnosis present

## 2018-01-14 DIAGNOSIS — F1290 Cannabis use, unspecified, uncomplicated: Secondary | ICD-10-CM | POA: Diagnosis present

## 2018-01-14 LAB — CBC
HCT: 40 % (ref 40.0–52.0)
Hemoglobin: 13.7 g/dL (ref 13.0–18.0)
MCH: 29.5 pg (ref 26.0–34.0)
MCHC: 34.3 g/dL (ref 32.0–36.0)
MCV: 86 fL (ref 80.0–100.0)
Platelets: 184 10*3/uL (ref 150–440)
RBC: 4.65 MIL/uL (ref 4.40–5.90)
RDW: 17.5 % — ABNORMAL HIGH (ref 11.5–14.5)
WBC: 10.3 10*3/uL (ref 3.8–10.6)

## 2018-01-14 LAB — RENAL FUNCTION PANEL
Albumin: 4.3 g/dL (ref 3.5–5.0)
Anion gap: 16 — ABNORMAL HIGH (ref 5–15)
BUN: 35 mg/dL — AB (ref 6–20)
CHLORIDE: 87 mmol/L — AB (ref 101–111)
CO2: 28 mmol/L (ref 22–32)
Calcium: 8.6 mg/dL — ABNORMAL LOW (ref 8.9–10.3)
Creatinine, Ser: 8.1 mg/dL — ABNORMAL HIGH (ref 0.61–1.24)
GFR, EST AFRICAN AMERICAN: 9 mL/min — AB (ref >60)
GFR, EST NON AFRICAN AMERICAN: 8 mL/min — AB (ref >60)
Glucose, Bld: 114 mg/dL — ABNORMAL HIGH (ref 65–99)
Phosphorus: 5.4 mg/dL — ABNORMAL HIGH (ref 2.5–4.6)
Potassium: 2.7 mmol/L — CL (ref 3.5–5.1)
Sodium: 131 mmol/L — ABNORMAL LOW (ref 135–145)

## 2018-01-14 LAB — GLUCOSE, CAPILLARY: Glucose-Capillary: 133 mg/dL — ABNORMAL HIGH (ref 65–99)

## 2018-01-14 LAB — INFLUENZA PANEL BY PCR (TYPE A & B)
INFLBPCR: NEGATIVE
Influenza A By PCR: NEGATIVE

## 2018-01-14 LAB — MRSA PCR SCREENING: MRSA by PCR: NEGATIVE

## 2018-01-14 LAB — TSH: TSH: 0.983 u[IU]/mL (ref 0.350–4.500)

## 2018-01-14 MED ORDER — ACETAMINOPHEN 650 MG RE SUPP: 650.0000 mg | Freq: Four times a day (QID) | RECTAL | Status: DC | PRN

## 2018-01-14 MED ORDER — DOCUSATE SODIUM 100 MG PO CAPS
100.0000 mg | ORAL_CAPSULE | Freq: Two times a day (BID) | ORAL | Status: DC
  Administered 2018-01-14: 100 mg via ORAL
  Filled 2018-01-14: qty 1

## 2018-01-14 MED ORDER — MORPHINE SULFATE (PF) 2 MG/ML IV SOLN
1.0000 mg | INTRAVENOUS | Status: DC | PRN
  Administered 2018-01-14: 2 mg via INTRAVENOUS
  Filled 2018-01-14: qty 1

## 2018-01-14 MED ORDER — HEPARIN SODIUM (PORCINE) 5000 UNIT/ML IJ SOLN
5000.0000 [IU] | Freq: Three times a day (TID) | INTRAMUSCULAR | Status: DC
  Administered 2018-01-14: 5000 [IU] via SUBCUTANEOUS
  Filled 2018-01-14: qty 1

## 2018-01-14 MED ORDER — HYDRALAZINE HCL 20 MG/ML IJ SOLN
10.0000 mg | INTRAMUSCULAR | Status: DC | PRN
  Administered 2018-01-14: 20 mg via INTRAVENOUS
  Filled 2018-01-14: qty 1

## 2018-01-14 MED ORDER — NICARDIPINE HCL IN NACL 20-0.86 MG/200ML-% IV SOLN
3.0000 mg/h | INTRAVENOUS | Status: DC
  Administered 2018-01-14: 5 mg/h via INTRAVENOUS
  Administered 2018-01-14: 3 mg/h via INTRAVENOUS
  Filled 2018-01-14 (×2): qty 200

## 2018-01-14 MED ORDER — AMLODIPINE BESYLATE 10 MG PO TABS
10.0000 mg | ORAL_TABLET | Freq: Every evening | ORAL | Status: DC
  Administered 2018-01-14: 10 mg via ORAL
  Filled 2018-01-14: qty 1

## 2018-01-14 MED ORDER — LOSARTAN POTASSIUM 50 MG PO TABS
100.0000 mg | ORAL_TABLET | Freq: Every evening | ORAL | Status: DC
  Administered 2018-01-14: 100 mg via ORAL
  Filled 2018-01-14: qty 2

## 2018-01-14 MED ORDER — NITROGLYCERIN IN D5W 200-5 MCG/ML-% IV SOLN
0.0000 ug/min | Freq: Once | INTRAVENOUS | Status: DC
  Filled 2018-01-14: qty 250

## 2018-01-14 MED ORDER — PROMETHAZINE HCL 25 MG/ML IJ SOLN
6.2500 mg | Freq: Once | INTRAMUSCULAR | Status: AC
  Administered 2018-01-14: 6.25 mg via INTRAVENOUS
  Filled 2018-01-14: qty 1

## 2018-01-14 MED ORDER — ONDANSETRON HCL 4 MG/2ML IJ SOLN
4.0000 mg | Freq: Four times a day (QID) | INTRAMUSCULAR | Status: DC | PRN
  Administered 2018-01-14: 4 mg via INTRAVENOUS
  Filled 2018-01-14: qty 2

## 2018-01-14 MED ORDER — LABETALOL HCL 5 MG/ML IV SOLN
INTRAVENOUS | Status: AC
  Filled 2018-01-14: qty 4

## 2018-01-14 MED ORDER — ONDANSETRON HCL 4 MG PO TABS: 4.0000 mg | ORAL_TABLET | Freq: Four times a day (QID) | ORAL | Status: DC | PRN

## 2018-01-14 MED ORDER — CARVEDILOL 25 MG PO TABS
25.0000 mg | ORAL_TABLET | Freq: Two times a day (BID) | ORAL | Status: DC
  Administered 2018-01-14: 25 mg via ORAL
  Filled 2018-01-14: qty 1

## 2018-01-14 MED ORDER — RENA-VITE PO TABS: 1.0000 | ORAL_TABLET | Freq: Every day | ORAL | Status: DC

## 2018-01-14 MED ORDER — ACETAMINOPHEN 325 MG PO TABS
650.0000 mg | ORAL_TABLET | Freq: Four times a day (QID) | ORAL | Status: DC | PRN
Start: 2018-01-14 — End: 2018-01-15

## 2018-01-14 MED ORDER — HYDRALAZINE HCL 50 MG PO TABS
50.0000 mg | ORAL_TABLET | Freq: Two times a day (BID) | ORAL | Status: DC
  Administered 2018-01-14: 50 mg via ORAL
  Filled 2018-01-14: qty 1

## 2018-01-14 MED ORDER — LABETALOL HCL 5 MG/ML IV SOLN
20.0000 mg | Freq: Once | INTRAVENOUS | Status: AC
  Administered 2018-01-14: 20 mg via INTRAVENOUS

## 2018-01-14 MED ORDER — CLONIDINE HCL 0.1 MG PO TABS
0.2000 mg | ORAL_TABLET | Freq: Three times a day (TID) | ORAL | Status: DC
  Administered 2018-01-14: 0.2 mg via ORAL
  Filled 2018-01-14: qty 2

## 2018-01-14 NOTE — ED Notes (Signed)
Dr Owens Shark notified at this time of pt's elevated Troponin level as reported by lab= 0.21ng/mL

## 2018-01-14 NOTE — H&P (Addendum)
Alan Gross is an 32 y.o. male.   Chief Complaint: Chest pain HPI: The patient with past medical history of end-stage renal disease on dialysis, hypertension and history of stroke presents to the emergency department complaining of chest pain.  The pain began today after a nap following dialysis.  The patient did not undergo a full 3-hour treatment due to patient preference.  He woke feeling tightness throughout his chest and upper back.  He then became nauseous and had multiple episodes of nonbloody nonbilious emesis.  He also reports that his hands became numb and turned purple which prompted him to seek evaluation in the emergency department where he was found to have systolic blood pressure greater than 240, diastolic pressure greater than 100.  He was given labetalol in the emergency department without any response in his blood pressure which prompted the emergency department staff to start a nitroglycerin drip prior to calling the hospitalist service for admission.  Past Medical History:  Diagnosis Date  . Anxiety   . Depression   . Dyspnea   . ESRD (end stage renal disease) ([place])   . H/O cardiac catheterization   . History of febrile seizure   . Hypertension   . Irregular heart beats   . Kidney failure    M,W,F [facility] in [place]; 15 %   . Stroke (cerebrum) ([place])    no residual effects    Past Surgical History:  Procedure Laterality Date  . A/V FISTULAGRAM N/A 05/07/2017   Procedure: A/V Fistulagram;  Surgeon: Algernon Huxley, MD;  Location: [facility];  Service: Cardiovascular;  Laterality: N/A;  . AV FISTULA PLACEMENT Left 09/10/2016   Procedure: LEFT ARTERIOVENOUS (AV) FISTULA CREATION;  Surgeon: Conrad Sedgwick, MD;  Location: [facility];  Service: Vascular;  Laterality: Left;  . CARDIAC CATHETERIZATION Left 05/02/2015   Procedure: Left Heart Cath and Coronary Angiography;  Surgeon: Dionisio David, MD;  Location: [facility];  Service: Cardiovascular;  Laterality: Left;   . CORONARY ANGIOPLASTY    . PERIPHERAL VASCULAR CATHETERIZATION N/A 07/11/2016   Procedure: Dialysis/Perma Catheter Insertion;  Surgeon: Algernon Huxley, MD;  Location: [facility];  Service: Cardiovascular;  Laterality: N/A;  . PERIPHERAL VASCULAR THROMBECTOMY Left 05/07/2017   Procedure: Peripheral Vascular Thrombectomy;  Surgeon: Algernon Huxley, MD;  Location: [facility];  Service: Cardiovascular;  Laterality: Left;    Family History  Problem Relation Age of Onset  . Hypertension Other   . Cancer Father        liver  . Hypertension Brother   . Diabetes Paternal Aunt   . Diabetes Paternal Uncle    Social History:  reports that he has been smoking cigarettes.  He has a 2.50 pack-year smoking history. he has never used smokeless tobacco. He reports that he uses drugs. Drug: Marijuana. He reports that he does not drink alcohol.  Allergies:  Allergies  Allergen Reactions  . Lasix [Furosemide] Shortness Of Breath, Swelling and Anxiety    Medications Prior to Admission  Medication Sig Dispense Refill  . amLODipine (NORVASC) 10 MG tablet Take 1 tablet (10 mg total) by mouth every evening. 30 tablet 1  . cloNIDine (CATAPRES) 0.2 MG tablet Take 1 tablet (0.2 mg total) by mouth 3 (three) times daily. 90 tablet 2  . hydrALAZINE (APRESOLINE) 50 MG tablet Take 1 tablet (50 mg total) by mouth 2 (two) times daily. 60 tablet 1  . losartan (COZAAR) 100 MG tablet Take 1  tablet (100 mg total) by mouth every evening. 30 tablet 1  . aspirin 81 MG tablet Take 1 tablet (81 mg total) by mouth daily. (Patient not taking: Reported on 01/13/2018) 30 tablet 1  . carvedilol (COREG) 25 MG tablet Take 1 tablet (25 mg total) by mouth 2 (two) times daily with a meal. (Patient not taking: Reported on 01/13/2018) 60 tablet 1    Results for orders placed or performed during the hospital encounter of 01/13/18 (from the past 48 hour(s))  CBC     Status: Abnormal   Collection Time: 01/13/18 10:38 PM   Result Value Ref Range   WBC 14.1 (H) 3.8 - 10.6 K/uL   RBC 5.12 4.40 - 5.90 MIL/uL   Hemoglobin 14.6 13.0 - 18.0 g/dL   HCT 44.4 40.0 - 52.0 %   MCV 86.7 80.0 - 100.0 fL   MCH 28.5 26.0 - 34.0 pg   MCHC 32.9 32.0 - 36.0 g/dL   RDW 17.4 (H) 11.5 - 14.5 %   Platelets 169 150 - 440 K/uL    Comment: Performed at [facility], [address] [place], [place] [address]  Comprehensive metabolic panel     Status: Abnormal   Collection Time: 01/13/18 10:38 PM  Result Value Ref Range   Sodium 133 (L) 135 - 145 mmol/L   Potassium 3.3 (L) 3.5 - 5.1 mmol/L    Comment: HEMOLYSIS AT THIS LEVEL MAY AFFECT RESULT   Chloride 87 (L) 101 - 111 mmol/L   CO2 31 22 - 32 mmol/L   Glucose, Bld 130 (H) 65 - 99 mg/dL   BUN 29 (H) 6 - 20 mg/dL   Creatinine, Ser 7.58 (H) 0.61 - 1.24 mg/dL   Calcium 9.3 8.9 - 10.3 mg/dL   Total Protein 8.7 (H) 6.5 - 8.1 g/dL   Albumin 4.8 3.5 - 5.0 g/dL   AST 40 15 - 41 U/L    Comment: HEMOLYSIS AT THIS LEVEL MAY AFFECT RESULT   ALT 15 (L) 17 - 63 U/L   Alkaline Phosphatase 83 38 - 126 U/L   Total Bilirubin 1.1 0.3 - 1.2 mg/dL    Comment: HEMOLYSIS AT THIS LEVEL MAY AFFECT RESULT   GFR calc non Af Amer 8 (L) >60 mL/min   GFR calc Af Amer 10 (L) >60 mL/min    Comment: (NOTE) The eGFR has been calculated using the CKD EPI equation. This calculation has not been validated in all clinical situations. eGFR's persistently <60 mL/min signify possible Chronic Kidney Disease.    Anion gap 15 5 - 15    Comment: Performed at [facility], [address] [place], [place] [address]  Troponin I     Status: Abnormal   Collection Time: 01/13/18 10:38 PM  Result Value Ref Range   Troponin I 0.21 (HH) <0.03 ng/mL    Comment: CRITICAL RESULT CALLED TO, READ BACK BY AND VERIFIED WITH BUTCH WOODS ON 01/14/18 AT 0003 JAG Performed at [facility], [address] [place], [place] [address]   Lipase, blood     Status: Abnormal   Collection Time:  01/13/18 10:38 PM  Result Value Ref Range   Lipase 71 (H) 11 - 51 U/L    Comment: Performed at [facility], [address] [place], [place] [address]  Urinalysis, Complete w Microscopic     Status: Abnormal   Collection Time: 01/13/18 10:38 PM  Result Value Ref Range   Color, Urine YELLOW (A) YELLOW   APPearance  CLEAR (A) CLEAR   Specific Gravity, Urine 1.016 1.005 - 1.030   pH 7.0 5.0 - 8.0   Glucose, UA 50 (A) NEGATIVE mg/dL   Hgb urine dipstick NEGATIVE NEGATIVE   Bilirubin Urine NEGATIVE NEGATIVE   Ketones, ur NEGATIVE NEGATIVE mg/dL   Protein, ur >=300 (A) NEGATIVE mg/dL   Nitrite NEGATIVE NEGATIVE   Leukocytes, UA NEGATIVE NEGATIVE   RBC / HPF 0-5 0 - 5 RBC/hpf   WBC, UA 6-30 0 - 5 WBC/hpf   Bacteria, UA RARE (A) NONE SEEN   Squamous Epithelial / LPF 0-5 (A) NONE SEEN   Mucus PRESENT     Comment: Performed at [facility], [address] [place], [place] [address]  Influenza panel by PCR (type A & B)     Status: None   Collection Time: 01/13/18 10:38 PM  Result Value Ref Range   Influenza A By PCR NEGATIVE NEGATIVE   Influenza B By PCR NEGATIVE NEGATIVE    Comment: (NOTE) The Xpert Xpress Flu assay is intended as an aid in the diagnosis of  influenza and should not be used as a sole basis for treatment.  This  assay is FDA approved for nasopharyngeal swab specimens only. Nasal  washings and aspirates are unacceptable for Xpert Xpress Flu testing. Performed at [facility], [address] [place], [place] [address]   TSH     Status: None   Collection Time: 01/13/18 10:38 PM  Result Value Ref Range   TSH 0.983 0.350 - 4.500 uIU/mL    Comment: Performed by a 3rd Generation assay with a functional sensitivity of <=0.01 uIU/mL. Performed at [facility], [address] [place], [place] [address]   Glucose, capillary     Status: Abnormal   Collection Time: 01/14/18  5:10 AM  Result Value Ref Range   Glucose-Capillary 133 (H)  65 - 99 mg/dL   Dg Chest 2 View  Result Date: 01/13/2018 CLINICAL DATA:  Shortness of breath, chest pain EXAM: CHEST  2 VIEW COMPARISON:  10/26/2017 FINDINGS: Cardiomegaly. No confluent opacities, effusions or edema. No acute bony abnormality. IMPRESSION: Cardiomegaly.  No active disease. Electronically Signed   By: Rolm Baptise M.D.   On: 01/13/2018 23:25    Review of Systems  Constitutional: Negative for chills and fever.  HENT: Negative for sore throat and tinnitus.   Eyes: Negative for blurred vision and redness.  Respiratory: Negative for cough and shortness of breath.   Cardiovascular: Positive for chest pain. Negative for palpitations, orthopnea and PND.  Gastrointestinal: Positive for nausea and vomiting. Negative for abdominal pain and diarrhea.  Genitourinary: Negative for dysuria, frequency and urgency.  Musculoskeletal: Negative for joint pain and myalgias.  Skin: Negative for rash.       No lesions  Neurological: Negative for speech change, focal weakness and weakness.  Endo/Heme/Allergies: Does not bruise/bleed easily.       No temperature intolerance  Psychiatric/Behavioral: Negative for depression and suicidal ideas.    Blood pressure (!) 156/102, pulse (!) 110, temperature 98.8 F (37.1 C), temperature source Oral, resp. rate (!) 22, height _0  (1.854 m), weight 106.2 kg (234 lb 2.1 oz), SpO2 95 %. Physical Exam  Constitutional: He is oriented to person, place, and time. He appears well-developed and well-nourished. No distress.  HENT:  Head: Normocephalic and atraumatic.  Mouth/Throat: Oropharynx is clear and moist.  Eyes: Conjunctivae and EOM are normal. Pupils are equal, round, and reactive to light. No scleral icterus.  Neck: Normal range of motion. Neck supple. No JVD present. No tracheal deviation present. No thyromegaly present.  Cardiovascular: Normal rate, regular rhythm and normal heart sounds. Exam reveals no gallop and no friction rub.  No murmur  heard. Respiratory: Effort normal and breath sounds normal.  GI: Soft. Bowel sounds are normal. He exhibits no distension. There is no tenderness.  Genitourinary:  Genitourinary Comments: Deferred  Musculoskeletal: Normal range of motion. He exhibits no edema.  Lymphadenopathy:    He has no cervical adenopathy.  Neurological: He is alert and oriented to person, place, and time. No cranial nerve deficit.  Skin: Skin is warm and dry. No rash noted. No erythema.  Psychiatric: He has a normal mood and affect. His behavior is normal. Judgment and thought content normal.     Assessment/Plan This is a 32 year old male admitted for hypertensive emergency. 1.  Hypertensive emergency: Elevated troponin and chest pain.  No EKG evidence of myocardial ischemia.  Chest pain has resolved as blood pressure has improved on nitroglycerin drip which we will continue.  Consult cardiology at the discretion of primary team as his troponin will likely remain elevated due to poor renal clearance.  Also continue amlodipine, clonidine, hydralazine and losartan.   2.  ESRD: On dialysis Monday Wednesday Friday. Consult nephrology for further recommendations including extra dialysis if needed. 3.  Leukocytosis: Differential diagnosis of etiology includes chronic inflammatory state of ESRD, multiple episodes of vomiting, and/or viral illness.  He inconsistently meets criteria for sepsis.  Pro-calcitonin may not be helpful in chronic inflammatory state.  Monitor clinical condition to decide on antimicrobials. 4.  DVT prophylaxis: Heparin 5.  GI prophylaxis: None The patient is a full code.  Time spent on admission orders and critical care approximately 45 minutes.  Discussed with E-Link telemedicine  Harrie Foreman, MD 01/14/2018, 6:18 AM

## 2018-01-14 NOTE — Progress Notes (Signed)
[facility]  ROUNDING NOTE   Subjective:   [name] admitted to [facility] on 01/13/2018 for Hypertensive emergency [I16.1]  Seen and examined on hemodialysis.   Objective:  Vital signs in last 24 hours:  Temp:  [97.9 F (36.6 C)-98.8 F (37.1 C)] 98.3 F (36.8 C) (02/27 0800) Pulse Rate:  [62-113] 88 (02/27 1100) Resp:  [15-22] 19 (02/27 1100) BP: (143-244)/(73-166) 143/89 (02/27 1100) SpO2:  [90 %-99 %] 93 % (02/27 1100) Weight:  [106.2 kg (234 lb 2.1 oz)-106.6 kg (235 lb)] 106.2 kg (234 lb 2.1 oz) (02/27 0513)  Weight change:  Filed Weights   01/13/18 2230 01/14/18 0513  Weight: 106.6 kg (235 lb) 106.2 kg (234 lb 2.1 oz)    Intake/Output: I/O last 3 completed shifts: In: 136.3 [I.V.:136.3] Out: -    Intake/Output this shift:  Total I/O In: 105 [I.V.:105] Out: 500 [Urine:500]  Physical Exam: General: NAD, Seating in chair  Head: Normocephalic, atraumatic. Moist oral mucosal membranes  Eyes: Anicteric, PERRL  Neck: Supple, trachea midline  Lungs:  Clear to auscultation  Heart: Regular rate and rhythm  Abdomen:  Soft, nontender,   Extremities: trace peripheral edema.  Neurologic: Nonfocal, moving all four extremities  Skin: No lesions  Access: AVF left     Basic Metabolic Panel: Recent Labs  Lab 01/13/18 2238 01/14/18 1131  NA 133* 131*  K 3.3* 2.7*  CL 87* 87*  CO2 31 28  GLUCOSE 130* 114*  BUN 29* 35*  CREATININE 7.58* 8.10*  CALCIUM 9.3 8.6*  PHOS  --  5.4*    Liver Function Tests: Recent Labs  Lab 01/13/18 2238 01/14/18 1131  AST 40  --   ALT 15*  --   ALKPHOS 83  --   BILITOT 1.1  --   PROT 8.7*  --   ALBUMIN 4.8 4.3   Recent Labs  Lab 01/13/18 2238  LIPASE 71*   No results for input(s): AMMONIA in the last 168 hours.  CBC: Recent Labs  Lab 01/13/18 2238 01/14/18 1131  WBC 14.1* 10.3  HGB 14.6 13.7  HCT 44.4 40.0  MCV 86.7 86.0  PLT 169 184    Cardiac Enzymes: Recent Labs  Lab 01/13/18 2238   TROPONINI 0.21*    BNP: Invalid input(s): POCBNP  CBG: Recent Labs  Lab 01/14/18 0510  GLUCAP 133*    Microbiology: Results for orders placed or performed during the hospital encounter of 01/13/18  MRSA PCR Screening     Status: None   Collection Time: 01/14/18  4:55 AM  Result Value Ref Range Status   MRSA by PCR NEGATIVE NEGATIVE Final    Comment:        The GeneXpert MRSA Assay (FDA approved for NASAL specimens only), is one component of a comprehensive MRSA colonization surveillance program. It is not intended to diagnose MRSA infection nor to guide or monitor treatment for MRSA infections. Performed at [facility], [address] [place], [place] [address]     Coagulation Studies: No results for input(s): LABPROT, INR in the last 72 hours.  Urinalysis: Recent Labs    01/13/18 2238  COLORURINE YELLOW*  LABSPEC 1.016  PHURINE 7.0  GLUCOSEU 50*  HGBUR NEGATIVE  BILIRUBINUR NEGATIVE  KETONESUR NEGATIVE  PROTEINUR >=300*  NITRITE NEGATIVE  LEUKOCYTESUR NEGATIVE      Imaging: Dg Chest 2 View  Result Date: 01/13/2018 CLINICAL DATA:  Shortness of breath, chest pain EXAM: CHEST  2 VIEW COMPARISON:  10/26/2017 FINDINGS:  Cardiomegaly. No confluent opacities, effusions or edema. No acute bony abnormality. IMPRESSION: Cardiomegaly.  No active disease. Electronically Signed   By: Rolm Baptise M.D.   On: 01/13/2018 23:25     Medications:   . niCARDipine Stopped (01/14/18 1100)  . nitroGLYCERIN Stopped (01/14/18 0527)   . amLODipine  10 mg Oral QPM  . carvedilol  25 mg Oral BID WC  . cloNIDine  0.2 mg Oral TID  . docusate sodium  100 mg Oral BID  . heparin  5,000 Units Subcutaneous Q8H  . hydrALAZINE  50 mg Oral BID  . losartan  100 mg Oral QPM  . [START ON 01/15/2018] multivitamin  1 tablet Oral QHS   acetaminophen **OR** acetaminophen, hydrALAZINE, morphine injection, ondansetron **OR** ondansetron (ZOFRAN) IV  Assessment/ Plan:  Mr.  Alan Gross is a 32 y.o. black male with end stage renal disease on hemodialysis, hypertension, proteinuria, no renal biopsy  MWF [facility] Left AVF  1. End Stage Renal Disease: seen and examined on hemodialysis. 4k bath due to hypokalemia.  Tolerating treatment well. Continue MWF schedule.  Patient is under his estimated dry weight but continues to have fluid, may need to adjust his outpatient dry weight.   2. Hypertension: urgency on admission. Patient unable to take PO due to GI illness - Restarted home blood pressure regimen: amlodipine 10mg  daily, carvedilol 25mg  bid, clonidine 0.2mg  tid, losartan 100mg  daily and hydralazine 50mg  bid.   3. Anemia of chronic kidney disease: hemoglobin 13.7 - Mircera as outpatient.   4. Secondary Hyperparathyroidism: phosphorus at goal.  - not currently on binders.    LOS: 0 Sheily Lineman 2/27/20195:48 PM

## 2018-01-14 NOTE — Progress Notes (Signed)
Lab called with a critical K of 2.7. Dialysis RN stated she would change pt's diasilate to a 4K bath and re-check a K upon completion of dialysis.

## 2018-01-14 NOTE — Progress Notes (Signed)
Pt discharged AMA this evening at 2035. Hospitalist, Salary made aware. Pt driving personal vehicle home. AMA form was signed and placed in Pt chart.

## 2018-01-14 NOTE — Progress Notes (Signed)
[facility] [place] NAME: Alan Gross    MR#:  [id number]  DATE OF BIRTH:  06-Dec-1985  SUBJECTIVE:  CHIEF COMPLAINT:   Chief Complaint  Patient presents with  . Chest Pain   - came in with chest pain- BP was elevated - on nicardipine drip - for HD today   REVIEW OF SYSTEMS:  Review of Systems  Constitutional: Negative for chills, fever and malaise/fatigue.  HENT: Negative for congestion, ear discharge, hearing loss and nosebleeds.   Respiratory: Negative for cough, shortness of breath and wheezing.   Cardiovascular: Positive for chest pain. Negative for palpitations.  Gastrointestinal: Negative for abdominal pain, constipation, diarrhea, nausea and vomiting.  Genitourinary: Negative for dysuria and urgency.  Musculoskeletal: Negative for myalgias.  Neurological: Negative for dizziness, speech change, focal weakness, seizures and headaches.  Psychiatric/Behavioral: Negative for depression.    DRUG ALLERGIES:   Allergies  Allergen Reactions  . Lasix [Furosemide] Shortness Of Breath, Swelling and Anxiety    VITALS:  Blood pressure (!) 143/89, pulse 88, temperature 98.3 F (36.8 C), temperature source Axillary, resp. rate 19, height 6\' 1"  (1.854 m), weight 106.2 kg (234 lb 2.1 oz), SpO2 93 %.  PHYSICAL EXAMINATION:  Physical Exam  GENERAL:  32 y.o.-year-old patient lying in the bed with no acute distress.  EYES: Pupils equal, round, reactive to light and accommodation. No scleral icterus. Extraocular muscles intact.  HEENT: Head atraumatic, normocephalic. Oropharynx and nasopharynx clear.  NECK:  Supple, no jugular venous distention. No thyroid enlargement, no tenderness.  LUNGS: Normal breath sounds bilaterally, no wheezing, rales,rhonchi or crepitation. No use of accessory muscles of respiration.  CARDIOVASCULAR: S1, S2 normal. No   rubs, or gallops. 3/6 systolic murmur present. ABDOMEN: Soft, nontender, nondistended.  Bowel sounds present. No organomegaly or mass.  EXTREMITIES: No pedal edema, cyanosis, or clubbing. Left fore arm AV fistula noted. NEUROLOGIC: Cranial nerves II through XII are intact. Muscle strength 5/5 in all extremities. Sensation intact. Gait not checked.  PSYCHIATRIC: The patient is alert and oriented x 3.  SKIN: No obvious rash, lesion, or ulcer.    LABORATORY PANEL:   CBC Recent Labs  Lab 01/14/18 1131  WBC 10.3  HGB 13.7  HCT 40.0  PLT 184   ------------------------------------------------------------------------------------------------------------------  Chemistries  Recent Labs  Lab 01/13/18 2238 01/14/18 1131  NA 133* 131*  K 3.3* 2.7*  CL 87* 87*  CO2 31 28  GLUCOSE 130* 114*  BUN 29* 35*  CREATININE 7.58* 8.10*  CALCIUM 9.3 8.6*  AST 40  --   ALT 15*  --   ALKPHOS 83  --   BILITOT 1.1  --    ------------------------------------------------------------------------------------------------------------------  Cardiac Enzymes Recent Labs  Lab 01/13/18 2238  TROPONINI 0.21*   ------------------------------------------------------------------------------------------------------------------  RADIOLOGY:  Dg Chest 2 View  Result Date: 01/13/2018 CLINICAL DATA:  Shortness of breath, chest pain EXAM: CHEST  2 VIEW COMPARISON:  10/26/2017 FINDINGS: Cardiomegaly. No confluent opacities, effusions or edema. No acute bony abnormality. IMPRESSION: Cardiomegaly.  No active disease. Electronically Signed   By: Rolm Baptise M.D.   On: 01/13/2018 23:25    EKG:   Orders placed or performed during the hospital encounter of 01/13/18  . ED EKG  . ED EKG  . EKG 12-Lead  . EKG 12-Lead    ASSESSMENT AND PLAN:   32 year old African-American male with end-stage renal disease on Monday, Wednesday and  Friday hemodialysis, h/o stroke with no residual deficits, hypertension, anxiety and  deression,  presents to the hospital secondary to chest pressure and noted to have  elevated blood pressure.  1. Hypertensive urgency- on nicardipine drip- wean as tolerated - home meds restarted- on norvasc, coreg, clonidine, hydralazine and losartan  2. ESRD on hemodialysis MWF- nephrology consulted. For dialysis today per schedule  3. Hypokalemia- can be corrected with dialysis F/u in [place]  4. DVT Prophylaxis- SQ heparin     All the records are reviewed and case discussed with Care Management/Social Workerr. Management plans discussed with the patient, family and they are in agreement.  CODE STATUS: Full Code  TOTAL TIME TAKING CARE OF THIS PATIENT: 34 minutes.   POSSIBLE D/C IN 2 DAYS, DEPENDING ON CLINICAL CONDITION.   Gladstone Lighter M.D on 01/14/2018 at 3:10 PM  Between 7am to 6pm - Pager - [phone]  After 6pm go to [email or web address] - password [email or web address] [facility]  Office  [phone]  CC: Primary care physician; Patient, No Pcp Per

## 2018-01-14 NOTE — Progress Notes (Signed)
eLink Physician-Brief Progress Note Patient Name: Alan Gross DOB: August 03, 1986 MRN: [id number]   Date of Service  01/14/2018  HPI/Events of Note  32 yo male with PMH of ESRD on HD. +/-compliance. Woke up with chest pain and SOB. Found to have SBP > 200. Now on a NTG IV infusion. PCCM asked to assume care in ICU.   eICU Interventions  No new orders.      Intervention Category Evaluation Type: New Patient Evaluation  Lysle Dingwall 01/14/2018, 5:03 AM

## 2018-01-14 NOTE — Progress Notes (Signed)
Pt. back from dialysis

## 2018-01-14 NOTE — Progress Notes (Signed)
Pt to dialysis.

## 2018-01-14 NOTE — Consult Note (Signed)
Name: Alan Gross MRN: [id number] DOB: 1986-03-23    ADMISSION DATE:  01/13/2018 CONSULTATION DATE: 01/14/2018  REFERRING MD :  Dr. Marcille Blanco   CHIEF COMPLAINT: Chest Pain   BRIEF PATIENT DESCRIPTION:  32 yo male admitted with chest pain and hypertensive emergency requiring nitroglycerin gtt   SIGNIFICANT EVENTS  02/27-Pt admitted to stepdown unit   STUDIES:  None   HISTORY OF PRESENT ILLNESS:   This is a 32 y.o. 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  He presented to [facility] ER on 02/26 with c/o chest pain with numbness of the hand onset of symptoms 2 days prior to presentation.  He also endorsed multiple episodes of vomiting, chills, and body aches.  His most recent HD session was 02/25.  He attempted to take his antihypertensives, however he immediately vomited.  Upon arrival to the ER his blood pressure was 244/166, he was given 20 mg iv labetalol x2 doses without improvement of bp therefore nitroglycerin gtt started.  He was subsequently admitted to the stepdown unit by hospitalist team for further workup and treatment.   PAST MEDICAL HISTORY :   has a past medical history of Anxiety, Depression, Dyspnea, ESRD (end stage renal disease) ([place]), H/O cardiac catheterization, History of febrile seizure, Hypertension, Irregular heart beats, Kidney failure, and Stroke (cerebrum) ([name]).  has a past surgical history that includes Coronary angioplasty; Cardiac catheterization (Left, 05/02/2015); Cardiac catheterization (N/A, 07/11/2016); AV fistula placement (Left, 09/10/2016); PERIPHERAL VASCULAR THROMBECTOMY (Left, 05/07/2017); and A/V Fistulagram (N/A, 05/07/2017). Prior to Admission medications   Medication Sig Start Date End Date Taking? Authorizing Provider  amLODipine (NORVASC) 10 MG tablet Take 1 tablet (10 mg total) by mouth every evening. 07/10/17  Yes  Awilda Bill, NP  cloNIDine (CATAPRES) 0.2 MG tablet Take 1 tablet (0.2 mg total) by mouth 3 (three) times daily. 09/04/17  Yes Gladstone Lighter, MD  hydrALAZINE (APRESOLINE) 50 MG tablet Take 1 tablet (50 mg total) by mouth 2 (two) times daily. 09/04/17  Yes Gladstone Lighter, MD  losartan (COZAAR) 100 MG tablet Take 1 tablet (100 mg total) by mouth every evening. 09/04/17  Yes Gladstone Lighter, MD  aspirin 81 MG tablet Take 1 tablet (81 mg total) by mouth daily. Patient not taking: Reported on 01/13/2018 09/04/17   Gladstone Lighter, MD  carvedilol (COREG) 25 MG tablet Take 1 tablet (25 mg total) by mouth 2 (two) times daily with a meal. Patient not taking: Reported on 01/13/2018 07/10/17   Awilda Bill, NP   Allergies  Allergen Reactions  . Lasix [Furosemide] Shortness Of Breath, Swelling and Anxiety    FAMILY HISTORY:  family history includes Cancer in his father; Diabetes in his paternal aunt and paternal uncle; Hypertension in his brother and other. SOCIAL HISTORY:  reports that he has been smoking cigarettes.  He has a 2.50 pack-year smoking history. he has never used smokeless tobacco. He reports that he uses drugs. Drug: Marijuana. He reports that he does not drink alcohol.  REVIEW OF SYSTEMS: Positives in BOLD  Constitutional: fever, chills, weight loss, malaise/fatigue and diaphoresis.  HENT: Negative for hearing loss, ear pain, nosebleeds, congestion, sore throat, neck pain, tinnitus and ear discharge.   Eyes: Negative for blurred vision, double vision, photophobia, pain, discharge and redness.  Respiratory: cough, hemoptysis, sputum production, shortness of breath, wheezing and stridor.   Cardiovascular: chest pain, palpitations, orthopnea, claudication, leg swelling and  PND.  Gastrointestinal: heartburn, nausea, vomiting, abdominal pain, diarrhea, constipation, blood in stool and melena.  Genitourinary: Negative for dysuria, urgency, frequency, hematuria and flank  pain.  Musculoskeletal: Negative for myalgias, back pain, joint pain and falls.  Skin: Negative for itching and rash.  Neurological: Negative for dizziness, tingling, tremors, sensory change, speech change, focal weakness, seizures, loss of consciousness, weakness and headaches.  Endo/Heme/Allergies: Negative for environmental allergies and polydipsia. Does not bruise/bleed easily.  SUBJECTIVE:  c/o headache and intermittent chest cramping    VITAL SIGNS: Temp:  [97.9 F (36.6 C)] 97.9 F (36.6 C) (02/26 2234) Pulse Rate:  [81-113] 90 (02/27 0230) Resp:  [19-22] 20 (02/27 0230) BP: (180-244)/(118-166) 180/118 (02/27 0230) SpO2:  [93 %-99 %] 94 % (02/27 0230) Weight:  [106.6 kg (235 lb)] 106.6 kg (235 lb) (02/26 2230)  PHYSICAL EXAMINATION: General: well developed, well nourished, NAD  Neuro: alert and oriented, follows commands  HEENT: supple, no JVD Cardiovascular: s1s2, rrr, no R/G Lungs: clear throughout, even, non labored  Abdomen: +BS x4, soft, non tender, non distended  Musculoskeletal: normal bulk and tone, no edema  Skin: intact no rashes or lesions   Recent Labs  Lab 01/13/18 2238  NA 133*  K 3.3*  CL 87*  CO2 31  BUN 29*  CREATININE 7.58*  GLUCOSE 130*   Recent Labs  Lab 01/13/18 2238  HGB 14.6  HCT 44.4  WBC 14.1*  PLT 169   Dg Chest 2 View  Result Date: 01/13/2018 CLINICAL DATA:  Shortness of breath, chest pain EXAM: CHEST  2 VIEW COMPARISON:  10/26/2017 FINDINGS: Cardiomegaly. No confluent opacities, effusions or edema. No acute bony abnormality. IMPRESSION: Cardiomegaly.  No active disease. Electronically Signed   By: Rolm Baptise M.D.   On: 01/13/2018 23:25    ASSESSMENT / PLAN: Hypertensive Emergency  ESRD on Hemodialysis  Hypokalemia  Elevated troponin's likely secondary to demand ischemia in setting of hypertension  Leukocytosis Current Everyday Smoker  P: Supplemental O2 for dyspnea and/or hypoxia  Prn CXR  Continuous telemetry  monitoring  Trend troponin's  Discontinue nitroglycerin gtt and start nicardipine gtt for systolic bp goal of <492 and/or diastolic <010 Continue outpatient cardiac medications Will add prn iv hydralazine for bp management Nephrology consulted appreciate input-HD per recommendations   Trend BMP Replace electrolytes as indicated  Subq heparin for VTE prophylaxis  Trend CBC Monitor for s/sx of bleeding and transfuse for hgb <7  Prn zofran for nausea or vomiting  Trend WBC and monitor fever  r/o influenza  Smoking Cessation counseling provided  Marda Stalker, [place] Pager [phone] (please enter 7 digits) PCCM Consult Pager [phone] (please enter 7 digits)

## 2018-01-14 NOTE — Care Management (Signed)
Alan Gross with [employer] notified of patient admission.

## 2018-01-15 LAB — HEPATITIS B SURFACE ANTIGEN: Hepatitis B Surface Ag: NEGATIVE

## 2018-01-15 LAB — HEPATITIS B CORE ANTIBODY, TOTAL: HEP B C TOTAL AB: NEGATIVE

## 2018-01-15 LAB — HEPATITIS B SURFACE ANTIBODY,QUALITATIVE: Hep B S Ab: REACTIVE

## 2018-01-26 NOTE — Discharge Summary (Signed)
[facility] NAME: Alan Gross    MR#:  [id number]  DATE OF BIRTH:  07/25/1986  DATE OF ADMISSION:  01/13/2018   ADMITTING PHYSICIAN: Harrie Foreman, MD  DATE OF DISCHARGE: 01/14/2018  8:35 PM LEFT AMA  PRIMARY CARE PHYSICIAN: Patient, No Pcp Per   ADMISSION DIAGNOSIS:   Hypertensive emergency [I16.1]  DISCHARGE DIAGNOSIS:   Active Problems:   Hypertensive emergency   SECONDARY DIAGNOSIS:   Past Medical History:  Diagnosis Date  . Anxiety   . Depression   . Dyspnea   . ESRD (end stage renal disease) ([place])   . H/O cardiac catheterization   . History of febrile seizure   . Hypertension   . Irregular heart beats   . Kidney failure    M,W,F [facility] in [place]; 15 %   . Stroke (cerebrum) [facility])    no residual effects    HOSPITAL COURSE:   32 year old African-American male with end-stage renal disease on Monday, Wednesday and  Friday hemodialysis, h/o stroke with no residual deficits, hypertension, anxiety and deression,  presents to the hospital secondary to chest pressure and noted to have elevated blood pressure.  1. Hypertensive urgency- on nicardipine drip- weaned off - home meds restarted- on norvasc, coreg, clonidine, hydralazine and losartan  2. ESRD on hemodialysis MWF- nephrology consulted. had dialysis today per schedule  3. Hypokalemia- can be corrected with dialysis   Patient was advised to stay overnight to monitor his pressures on oral medications.He decided to leave AMA   DISCHARGE CONDITIONS:   Guarded  CONSULTS OBTAINED:   Treatment Team:  Pccm, Ander Gaster, MD Lavonia Dana, MD  DRUG ALLERGIES:   Allergies  Allergen Reactions  . Lasix [Furosemide] Shortness Of Breath, Swelling and Anxiety   DISCHARGE MEDICATIONS:   Allergies as of 01/14/2018      Reactions   Lasix [furosemide] Shortness Of Breath, Swelling, Anxiety      Medication List    ASK your doctor about  these medications   amLODipine 10 MG tablet Commonly known as:  NORVASC Take 1 tablet (10 mg total) by mouth every evening.   aspirin 81 MG tablet Take 1 tablet (81 mg total) by mouth daily.   carvedilol 25 MG tablet Commonly known as:  COREG Take 1 tablet (25 mg total) by mouth 2 (two) times daily with a meal.   cloNIDine 0.2 MG tablet Commonly known as:  CATAPRES Take 1 tablet (0.2 mg total) by mouth 3 (three) times daily.   hydrALAZINE 50 MG tablet Commonly known as:  APRESOLINE Take 1 tablet (50 mg total) by mouth 2 (two) times daily.   losartan 100 MG tablet Commonly known as:  COZAAR Take 1 tablet (100 mg total) by mouth every evening.        DISCHARGE INSTRUCTIONS:   1. F/u with dialysis as prior scheduled 2. PCP f/u in 1 week 3. Advised to be compliant with medications  DIET:   Renal diet  ACTIVITY:   Activity as tolerated  OXYGEN:   Home Oxygen: No.  Oxygen Delivery: room air  DISCHARGE LOCATION:   home   If you experience worsening of your admission symptoms, develop shortness of breath, life threatening emergency, suicidal or homicidal thoughts you must seek medical attention immediately by calling 911 or calling your MD immediately  if symptoms less severe.  You Must read complete instructions/literature along with all the possible adverse reactions/side effects for all the Medicines you take  and that have been prescribed to you. Take any new Medicines after you have completely understood and accpet all the possible adverse reactions/side effects.   Please note  You were cared for by a hospitalist during your hospital stay. If you have any questions about your discharge medications or the care you received while you were in the hospital after you are discharged, you can call the unit and asked to speak with the hospitalist on call if the hospitalist that took care of you is not available. Once you are discharged, your primary care physician will  handle any further medical issues. Please note that NO REFILLS for any discharge medications will be authorized once you are discharged, as it is imperative that you return to your primary care physician (or establish a relationship with a primary care physician if you do not have one) for your aftercare needs so that they can reassess your need for medications and monitor your lab values.    On the day of Discharge:  VITAL SIGNS:   Blood pressure (!) 193/119, pulse (!) 42, temperature 98.3 F (36.8 C), temperature source Axillary, resp. rate (!) 22, height 6\' 1"  (1.854 m), weight 106.2 kg (234 lb 2.1 oz), SpO2 99 %.  PHYSICAL EXAMINATION:    GENERAL:  32 y.o.-year-old patient lying in the bed with no acute distress.  EYES: Pupils equal, round, reactive to light and accommodation. No scleral icterus. Extraocular muscles intact.  HEENT: Head atraumatic, normocephalic. Oropharynx and nasopharynx clear.  NECK:  Supple, no jugular venous distention. No thyroid enlargement, no tenderness.  LUNGS: Normal breath sounds bilaterally, no wheezing, rales,rhonchi or crepitation. No use of accessory muscles of respiration.  CARDIOVASCULAR: S1, S2 normal. No   rubs, or gallops. 3/6 systolic murmur present. ABDOMEN: Soft, nontender, nondistended. Bowel sounds present. No organomegaly or mass.  EXTREMITIES: No pedal edema, cyanosis, or clubbing. Left fore arm AV fistula noted. NEUROLOGIC: Cranial nerves II through XII are intact. Muscle strength 5/5 in all extremities. Sensation intact. Gait not checked.  PSYCHIATRIC: The patient is alert and oriented x 3.  SKIN: No obvious rash, lesion, or ulcer.     DATA REVIEW:   CBC No results for input(s): WBC, HGB, HCT, PLT in the last 168 hours.  Chemistries  No results for input(s): NA, K, CL, CO2, GLUCOSE, BUN, CREATININE, CALCIUM, MG, AST, ALT, ALKPHOS, BILITOT in the last 168 hours.  Invalid input(s): [place]   Microbiology Results  Results for  orders placed or performed during the hospital encounter of 01/13/18  MRSA PCR Screening     Status: None   Collection Time: 01/14/18  4:55 AM  Result Value Ref Range Status   MRSA by PCR NEGATIVE NEGATIVE Final    Comment:        The GeneXpert MRSA Assay (FDA approved for NASAL specimens only), is one component of a comprehensive MRSA colonization surveillance program. It is not intended to diagnose MRSA infection nor to guide or monitor treatment for MRSA infections. Performed at [facility] [address], [place], [place] [address]     RADIOLOGY:  No results found.   Management plans discussed with the patient, family and they are in agreement.  CODE STATUS:  Code Status History    Date Active Date Inactive Code Status Order ID Comments User Context   01/14/2018 04:21 01/15/2018 01:49 Full Code [id number]  Harrie Foreman, MD ED   10/27/2017 02:32 10/27/2017 23:15 Full Code [id number]  Saundra Shelling, MD Inpatient  09/03/2017 21:41 09/04/2017 21:12 Full Code [id number]  Gladstone Lighter, MD Inpatient   07/09/2017 11:03 07/10/2017 19:14 Full Code [id number]  Epifanio Lesches, MD ED   05/07/2017 13:26 05/07/2017 17:48 Full Code [id number]  Algernon Huxley, MD Inpatient   04/01/2017 22:21 04/03/2017 15:48 Full Code [id number]  Harrie Foreman, MD Inpatient   07/09/2016 19:50 07/14/2016 14:49 Full Code [id number]  Lytle Butte, MD ED   04/16/2016 22:51 04/18/2016 20:14 Full Code [id number]  Quintella Baton, MD Inpatient   05/02/2015 19:22 05/04/2015 14:49 Full Code [id number]  Demetrios Loll, MD Inpatient   05/02/2015 15:32 05/02/2015 19:22 Full Code [id number]  Dionisio David, MD ED      TOTAL TIME TAKING CARE OF THIS PATIENT: 38 minutes.    Gladstone Lighter M.D on 01/26/2018 at 1:49 PM  Between 7am to 6pm - Pager - [phone]  After 6pm go to [email or web address] - Proofreader  [facility] [place]  Office  [phone]  CC: Primary care physician;  Patient, No Pcp Per   Note: This dictation was prepared with Dragon dictation along with smaller phrase technology. Any transcriptional errors that result from this process are unintentional.

## 2018-03-29 ENCOUNTER — Emergency Department: Payer: Self-pay

## 2018-03-29 ENCOUNTER — Emergency Department
Admission: EM | Admit: 2018-03-29 | Discharge: 2018-03-29 | Disposition: A | Discharge status: Home / Self Care | Payer: Self-pay | Attending: Emergency Medicine | Admitting: Emergency Medicine

## 2018-03-29 ENCOUNTER — Other Ambulatory Visit: Payer: Self-pay

## 2018-03-29 DIAGNOSIS — I5030 Unspecified diastolic (congestive) heart failure: Secondary | ICD-10-CM | POA: Insufficient documentation

## 2018-03-29 DIAGNOSIS — Z79899 Other long term (current) drug therapy: Secondary | ICD-10-CM | POA: Insufficient documentation

## 2018-03-29 DIAGNOSIS — F17210 Nicotine dependence, cigarettes, uncomplicated: Secondary | ICD-10-CM | POA: Insufficient documentation

## 2018-03-29 DIAGNOSIS — I132 Hypertensive heart and chronic kidney disease with heart failure and with stage 5 chronic kidney disease, or end stage renal disease: Secondary | ICD-10-CM | POA: Insufficient documentation

## 2018-03-29 DIAGNOSIS — I10 Essential (primary) hypertension: Secondary | ICD-10-CM

## 2018-03-29 DIAGNOSIS — N186 End stage renal disease: Secondary | ICD-10-CM | POA: Insufficient documentation

## 2018-03-29 DIAGNOSIS — R0789 Other chest pain: Secondary | ICD-10-CM

## 2018-03-29 DIAGNOSIS — I471 Supraventricular tachycardia: Secondary | ICD-10-CM | POA: Insufficient documentation

## 2018-03-29 LAB — CBC WITH DIFFERENTIAL/PLATELET
BASOS PCT: 0 %
Basophils Absolute: 0 10*3/uL (ref 0–0.1)
EOS ABS: 0.1 10*3/uL (ref 0–0.7)
EOS PCT: 1 %
HCT: 34.7 % — ABNORMAL LOW (ref 40.0–52.0)
HEMOGLOBIN: 11.8 g/dL — AB (ref 13.0–18.0)
LYMPHS ABS: 1.3 10*3/uL (ref 1.0–3.6)
Lymphocytes Relative: 15 %
MCH: 31.6 pg (ref 26.0–34.0)
MCHC: 34.1 g/dL (ref 32.0–36.0)
MCV: 92.7 fL (ref 80.0–100.0)
MONO ABS: 0.7 10*3/uL (ref 0.2–1.0)
MONOS PCT: 8 %
NEUTROS PCT: 76 %
Neutro Abs: 6.5 10*3/uL (ref 1.4–6.5)
Platelets: 152 10*3/uL (ref 150–440)
RBC: 3.74 MIL/uL — ABNORMAL LOW (ref 4.40–5.90)
RDW: 15.9 % — AB (ref 11.5–14.5)
WBC: 8.7 10*3/uL (ref 3.8–10.6)

## 2018-03-29 LAB — BASIC METABOLIC PANEL
Anion gap: 10 (ref 5–15)
BUN: 32 mg/dL — AB (ref 6–20)
CALCIUM: 8.7 mg/dL — AB (ref 8.9–10.3)
CHLORIDE: 96 mmol/L — AB (ref 101–111)
CO2: 30 mmol/L (ref 22–32)
CREATININE: 6.64 mg/dL — AB (ref 0.61–1.24)
GFR calc Af Amer: 11 mL/min — ABNORMAL LOW (ref >60)
GFR calc non Af Amer: 10 mL/min — ABNORMAL LOW (ref >60)
Glucose, Bld: 145 mg/dL — ABNORMAL HIGH (ref 65–99)
Potassium: 2.7 mmol/L — CL (ref 3.5–5.1)
SODIUM: 136 mmol/L (ref 135–145)

## 2018-03-29 MED ORDER — POTASSIUM CHLORIDE CRYS ER 20 MEQ PO TBCR
20.0000 meq | EXTENDED_RELEASE_TABLET | Freq: Once | ORAL | Status: AC
Start: 2018-03-29 — End: 2018-03-29
  Administered 2018-03-29: 20 meq via ORAL
  Filled 2018-03-29: qty 1

## 2018-03-29 MED ORDER — AMLODIPINE BESYLATE 5 MG PO TABS
10.0000 mg | ORAL_TABLET | Freq: Once | ORAL | Status: AC
  Administered 2018-03-29: 10 mg via ORAL
  Filled 2018-03-29: qty 2

## 2018-03-29 MED ORDER — CARVEDILOL 25 MG PO TABS
25.0000 mg | ORAL_TABLET | Freq: Two times a day (BID) | ORAL | Status: DC
  Administered 2018-03-29: 25 mg via ORAL
  Filled 2018-03-29: qty 1

## 2018-03-29 MED ORDER — HYDRALAZINE HCL 20 MG/ML IJ SOLN
10.0000 mg | Freq: Once | INTRAMUSCULAR | Status: AC
  Administered 2018-03-29: 10 mg via INTRAVENOUS
  Filled 2018-03-29: qty 1

## 2018-03-29 MED ORDER — CLONIDINE HCL 0.1 MG PO TABS
0.2000 mg | ORAL_TABLET | Freq: Once | ORAL | Status: AC
  Administered 2018-03-29: 0.2 mg via ORAL
  Filled 2018-03-29: qty 2

## 2018-03-29 MED ORDER — LOSARTAN POTASSIUM 50 MG PO TABS
100.0000 mg | ORAL_TABLET | Freq: Once | ORAL | Status: AC
  Administered 2018-03-29: 100 mg via ORAL
  Filled 2018-03-29: qty 2

## 2018-03-29 NOTE — ED Notes (Signed)
Family at bedside. 

## 2018-03-29 NOTE — ED Provider Notes (Signed)
[facility] Emergency Department Provider Note  ____________________________________________   I have reviewed the triage vital signs and the nursing notes.   HISTORY  Chief Complaint Shortness of Breath and Chest Pain   History limited by: Not Limited   HPI Alan Gross is a 32 y.o. male who presents to the emergency department today because of concern for chest pain and shortness of breath. Patient states symptoms started suddenly. He was sitting down when it began. He describes the pain as being located in the left lower chest. He describes it as pressure like. He felt very lightheaded and felt short of breath. States these symptoms have happened to him in the past. When first responders got there he was found to be significantly hypertensive and tachycardic. 12 lead showed SVT. The patient was given adenosine and did convert out of SVT.    Per medical record review patient has a history of hypertension and admissions for hypertension.  Past Medical History:  Diagnosis Date  . Anxiety   . Depression   . Dyspnea   . ESRD (end stage renal disease) ([place])   . H/O cardiac catheterization   . History of febrile seizure   . Hypertension   . Irregular heart beats   . Kidney failure    M,W,F [facility] in [place]; 15 %   . 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 Hypertensive emergency 01/14/2018  . Fluid overload 10/27/2017  . Chest pain 09/03/2017  . Elevated troponin 08/18/2017  . 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 Hypertensive urgency 04/01/2017  . ESRD (end stage renal disease) on dialysis ([place]) 07/19/2016  . Anxiety 07/19/2016  . Heart failure, diastolic, due to HTN ([place]) 07/19/2016  . Costochondritis 07/19/2016  . HTN (hypertension) 07/09/2016    Past Surgical History:  Procedure Laterality Date  . A/V FISTULAGRAM N/A 05/07/2017    Procedure: A/V Fistulagram;  Surgeon: Algernon Huxley, MD;  Location: [place] [facility];  Service: Cardiovascular;  Laterality: N/A;  . AV FISTULA PLACEMENT Left 09/10/2016   Procedure: LEFT ARTERIOVENOUS (AV) FISTULA CREATION;  Surgeon: Conrad Vienna, MD;  Location: [facility];  Service: Vascular;  Laterality: Left;  . CARDIAC CATHETERIZATION Left 05/02/2015   Procedure: Left Heart Cath and Coronary Angiography;  Surgeon: Dionisio David, MD;  Location: [place] [facility];  Service: Cardiovascular;  Laterality: Left;  . CORONARY ANGIOPLASTY    . PERIPHERAL VASCULAR CATHETERIZATION N/A 07/11/2016   Procedure: Dialysis/Perma Catheter Insertion;  Surgeon: Algernon Huxley, MD;  Location: [facility];  Service: Cardiovascular;  Laterality: N/A;  . PERIPHERAL VASCULAR THROMBECTOMY Left 05/07/2017   Procedure: Peripheral Vascular Thrombectomy;  Surgeon: Algernon Huxley, MD;  Location: [place] [facility];  Service: Cardiovascular;  Laterality: Left;    Prior to Admission medications   Medication Sig Start Date End Date Taking? Authorizing Provider  amLODipine (NORVASC) 10 MG tablet Take 1 tablet (10 mg total) by mouth every evening. 07/10/17   Awilda Bill, NP  aspirin 81 MG tablet Take 1 tablet (81 mg total) by mouth daily. Patient not taking: Reported on 01/13/2018 09/04/17   Gladstone Lighter, MD  carvedilol (COREG) 25 MG tablet Take 1 tablet (25 mg total) by mouth 2 (two) times daily with a meal. Patient not taking: Reported on 01/13/2018 07/10/17   Awilda Bill, NP  cloNIDine (CATAPRES)  0.2 MG tablet Take 1 tablet (0.2 mg total) by mouth 3 (three) times daily. 09/04/17   Gladstone Lighter, MD  hydrALAZINE (APRESOLINE) 50 MG tablet Take 1 tablet (50 mg total) by mouth 2 (two) times daily. 09/04/17   Gladstone Lighter, MD  losartan (COZAAR) 100 MG tablet Take 1 tablet (100 mg total) by mouth every evening. 09/04/17   Gladstone Lighter, MD    Allergies Lasix [furosemide]  Family History   Problem Relation Age of Onset  . Hypertension Other   . Cancer Father        liver  . Hypertension Brother   . Diabetes Paternal Aunt   . Diabetes Paternal Uncle     Social History Social History   Tobacco Use  . Smoking status: Current Every Day Smoker    Packs/day: 0.25    Years: 10.00    Pack years: 2.50    Types: Cigarettes    Last attempt to quit: 06/22/2016    Years since quitting: 1.7  . Smokeless tobacco: Never Used  Substance Use Topics  . Alcohol use: No  . Drug use: Yes    Types: Marijuana    Review of Systems Constitutional: No fever/chills Eyes: No visual changes. ENT: No sore throat. Cardiovascular: Positive for chest pain. Respiratory: Positive for shortness of breath. Gastrointestinal: No abdominal pain.  No nausea, no vomiting.  No diarrhea.   Genitourinary: Negative for dysuria. Musculoskeletal: Negative for back pain. Skin: Negative for rash. Neurological: Negative for headaches, focal weakness or numbness.  ____________________________________________   PHYSICAL EXAM:  VITAL SIGNS: ED Triage Vitals  Enc Vitals Group     BP 03/29/18 1513 (!) 221/165     Pulse Rate 03/29/18 1513 69     Resp 03/29/18 1513 (!) 30     Temp 03/29/18 1517 98.7 F (37.1 C)     Temp Source 03/29/18 1517 Oral     SpO2 03/29/18 1513 100 %     Weight 03/29/18 1514 245 lb (111.1 kg)     Height 03/29/18 1514 6\' 1"  (1.854 m)     Head Circumference --      Peak Flow --      Pain Score 03/29/18 1514 0   Constitutional: Alert and oriented. Well appearing and in no distress. Eyes: Conjunctivae are normal.  ENT   Head: Normocephalic and atraumatic.   Nose: No congestion/rhinnorhea.   Mouth/Throat: Mucous membranes are moist.   Neck: No stridor. Hematological/Lymphatic/Immunilogical: No cervical lymphadenopathy. Cardiovascular: Normal rate, regular rhythm.  No murmurs, rubs, or gallops. Respiratory: Normal respiratory effort without tachypnea nor  retractions. Breath sounds are clear and equal bilaterally. No wheezes/rales/rhonchi. Gastrointestinal: Soft and non tender. No rebound. No guarding.  Genitourinary: Deferred Musculoskeletal: Normal range of motion in all extremities. No lower extremity edema. Neurologic:  Normal speech and language. No gross focal neurologic deficits are appreciated.  Skin:  Skin is warm, dry and intact. No rash noted. Psychiatric: Mood and affect are normal. Speech and behavior are normal. Patient exhibits appropriate insight and judgment.  ____________________________________________    LABS (pertinent positives/negatives)  CBC wbc 8.7, hgb 11.8, plt 152 BMP na 136, k 2.7, glu 145, cr 6.64  ____________________________________________   EKG  I, Nance Pear, attending physician, personally viewed and interpreted this EKG  EKG Time: 1511 Rate: 103 Rhythm: sinus tachycardia Axis: normal Intervals: qtc 501 QRS: q waves v1, LVH ST changes: no st elevation Impression: abnormal ekg   ____________________________________________    RADIOLOGY  CXR Mild congestive heart  failure  ____________________________________________   PROCEDURES  Procedures  ____________________________________________   INITIAL IMPRESSION / ASSESSMENT AND PLAN / ED COURSE  Pertinent labs & imaging results that were available during my care of the patient were reviewed by me and considered in my medical decision making (see chart for details).  Patient presented to the emergency department via EMS because of concerns for chest pain and had been found to have SVT.  By the time he arrived in the emergency department he had converted back to a normal sinus rhythm.  Patient's blood pressure was noted be significantly elevated.  The patient states he has not been taking his blood pressure medication.  Patient was given medication here in the emergency department with good response to his blood pressure.  He did  feel good improvement to his chest discomfort.  Per chart review has had multiple admissions for hypertensive emergencies.  At this point do not think patient necessarily needs another admission.  His blood pressure and discomfort are well controlled.  Discussed importance of restarting blood pressure medication.   ____________________________________________   FINAL CLINICAL IMPRESSION(S) / ED DIAGNOSES  Final diagnoses:  Atypical chest pain  SVT (supraventricular tachycardia) ([place])  Hypertension, unspecified type     Note: This dictation was prepared with Dragon dictation. Any transcriptional errors that result from this process are unintentional     Nance Pear, MD 03/29/18 2056

## 2018-03-29 NOTE — ED Notes (Signed)
Pt requesting O2 to be taken off.Pt maintained O2 at 93%.

## 2018-03-29 NOTE — ED Notes (Signed)
Date and time results received: 03/29/18 1610 (use smartphrase ".now" to insert current time)  Test: potassium Critical Value: 2.7  Name of Provider Notified: MD Archie Balboa  Orders Received? Or Actions Taken?: Orders Received - See Orders for details

## 2018-03-29 NOTE — Discharge Instructions (Addendum)
Please seek medical attention for any high fevers, chest pain, shortness of breath, change in behavior, persistent vomiting, bloody stool or any other new or concerning symptoms.  

## 2018-03-29 NOTE — ED Notes (Signed)
Pt given meal tray and drink.

## 2018-03-29 NOTE — ED Notes (Signed)
Pt stating at this time that the oxygen is too much. Oxygen currently at 6L nasal cannula. Oxygen turned down to 2L. Oxygen reading 100%. Respirations even and unlabored.

## 2018-03-29 NOTE — ED Triage Notes (Addendum)
Pt comes via [facility] with c/o [facility] and chest pain. Pt is dialysis patient, fistula on left arm. Last dialysis was Saturday.  Pt states pain that was in left rib cage burning and tearing sensation. EMS reports 160 HR with BP-260/140, pt given 6mg  adenosine, and then 12mg  adenosine. BP-180/90. BS-202. Pt is alert. Nonrebreather in place by EMS. MD Archie Balboa at bedside.

## 2018-05-21 ENCOUNTER — Encounter: Payer: Self-pay | Admitting: Emergency Medicine

## 2018-05-21 ENCOUNTER — Emergency Department: Payer: Self-pay

## 2018-05-21 ENCOUNTER — Emergency Department
Admission: EM | Admit: 2018-05-21 | Discharge: 2018-05-21 | Disposition: A | Discharge status: Home / Self Care | Payer: Self-pay | Attending: Student in an Organized Health Care Education/Training Program | Admitting: Student in an Organized Health Care Education/Training Program

## 2018-05-21 ENCOUNTER — Other Ambulatory Visit: Payer: Self-pay

## 2018-05-21 DIAGNOSIS — I10 Essential (primary) hypertension: Secondary | ICD-10-CM

## 2018-05-21 DIAGNOSIS — Z992 Dependence on renal dialysis: Secondary | ICD-10-CM | POA: Insufficient documentation

## 2018-05-21 DIAGNOSIS — F17210 Nicotine dependence, cigarettes, uncomplicated: Secondary | ICD-10-CM | POA: Insufficient documentation

## 2018-05-21 DIAGNOSIS — Z79899 Other long term (current) drug therapy: Secondary | ICD-10-CM | POA: Insufficient documentation

## 2018-05-21 DIAGNOSIS — N186 End stage renal disease: Secondary | ICD-10-CM | POA: Insufficient documentation

## 2018-05-21 DIAGNOSIS — R040 Epistaxis: Secondary | ICD-10-CM | POA: Insufficient documentation

## 2018-05-21 DIAGNOSIS — I120 Hypertensive chronic kidney disease with stage 5 chronic kidney disease or end stage renal disease: Secondary | ICD-10-CM | POA: Insufficient documentation

## 2018-05-21 LAB — BASIC METABOLIC PANEL
ANION GAP: 11 (ref 5–15)
BUN: 35 mg/dL — ABNORMAL HIGH (ref 6–20)
CHLORIDE: 96 mmol/L — AB (ref 98–111)
CO2: 31 mmol/L (ref 22–32)
Calcium: 8.9 mg/dL (ref 8.9–10.3)
Creatinine, Ser: 7.81 mg/dL — ABNORMAL HIGH (ref 0.61–1.24)
GFR calc Af Amer: 9 mL/min — ABNORMAL LOW (ref >60)
GFR, EST NON AFRICAN AMERICAN: 8 mL/min — AB (ref >60)
GLUCOSE: 132 mg/dL — AB (ref 70–99)
POTASSIUM: 3.3 mmol/L — AB (ref 3.5–5.1)
Sodium: 138 mmol/L (ref 135–145)

## 2018-05-21 LAB — CBC
HEMATOCRIT: 36.3 % — AB (ref 40.0–52.0)
HEMOGLOBIN: 12.5 g/dL — AB (ref 13.0–18.0)
MCH: 31.6 pg (ref 26.0–34.0)
MCHC: 34.5 g/dL (ref 32.0–36.0)
MCV: 91.7 fL (ref 80.0–100.0)
Platelets: 155 10*3/uL (ref 150–440)
RBC: 3.96 MIL/uL — ABNORMAL LOW (ref 4.40–5.90)
RDW: 14.6 % — ABNORMAL HIGH (ref 11.5–14.5)
WBC: 8.4 10*3/uL (ref 3.8–10.6)

## 2018-05-21 LAB — TROPONIN I
TROPONIN I: 0.12 ng/mL — AB (ref <0.03)
Troponin I: 0.12 ng/mL (ref <0.03)

## 2018-05-21 MED ORDER — CLONIDINE HCL 0.1 MG PO TABS
0.2000 mg | ORAL_TABLET | Freq: Once | ORAL | Status: AC
  Administered 2018-05-21: 0.2 mg via ORAL
  Filled 2018-05-21: qty 2

## 2018-05-21 MED ORDER — CARVEDILOL 25 MG PO TABS
25.0000 mg | ORAL_TABLET | Freq: Two times a day (BID) | ORAL | Status: DC
  Administered 2018-05-21: 25 mg via ORAL
  Filled 2018-05-21: qty 1

## 2018-05-21 MED ORDER — AMLODIPINE BESYLATE 5 MG PO TABS
10.0000 mg | ORAL_TABLET | Freq: Once | ORAL | Status: AC
  Administered 2018-05-21: 10 mg via ORAL
  Filled 2018-05-21: qty 2

## 2018-05-21 MED ORDER — HYDRALAZINE HCL 50 MG PO TABS
50.0000 mg | ORAL_TABLET | Freq: Once | ORAL | Status: AC
  Administered 2018-05-21: 50 mg via ORAL
  Filled 2018-05-21: qty 1

## 2018-05-21 NOTE — ED Notes (Signed)
Date and time results received: 05/21/18 5:21 PM (use smartphrase ".now" to insert current time)  Test: Troponin Critical Value: 0.12  Name of Provider Notified: Dr. Quentin Cornwall  Orders Received? Or Actions Taken?: No new orders at this time.

## 2018-05-21 NOTE — ED Provider Notes (Signed)
[facility] Emergency Department Provider Note    First MD Initiated Contact with Patient 05/21/18 1729     (approximate)  I have reviewed the triage vital signs and the nursing notes.   HISTORY  Chief Complaint Chest Pain and Hemoptysis    HPI Alan Gross is a 32 y.o. male with extensive past medical history with end-stage renal disease on dialysis Monday Wednesday Friday had dialysis yesterday presents with persistent chest discomfort as well as nosebleeding that started 3 days ago.  States he did have 2 episodes where he coughed up some blood.  Denies any shortness of breath at this time.  No discomfort when taking deep breath.  No numbness or tingling.  No abdominal pain.  No melena.  No hematochezia or hematemesis.  Denies any chest pain at this time.    Past Medical History:  Diagnosis Date  . Anxiety   . Depression   . Dyspnea   . ESRD (end stage renal disease) ([place])   . H/O cardiac catheterization   . History of febrile seizure   . Hypertension   . Irregular heart beats   . Kidney failure    M,W,F [facility] in [place]; 15 %   . 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 Hypertension Other   . Cancer Father        liver  . Hypertension Brother   . Diabetes Paternal Aunt   . Diabetes Paternal Uncle    Past Surgical History:  Procedure Laterality Date  . A/V FISTULAGRAM N/A 05/07/2017   Procedure: A/V Fistulagram;  Surgeon: Algernon Huxley, MD;  Location: [place] [facility];  Service: Cardiovascular;  Laterality: N/A;  . AV FISTULA PLACEMENT Left 09/10/2016   Procedure: LEFT ARTERIOVENOUS (AV) FISTULA CREATION;  Surgeon: Conrad Haines, MD;  Location: [place];  Service: Vascular;  Laterality: Left;  . CARDIAC CATHETERIZATION Left 05/02/2015   Procedure: Left Heart Cath and Coronary Angiography;  Surgeon: Dionisio David, MD;  Location: [place] [facility];  Service: Cardiovascular;   Laterality: Left;  . CORONARY ANGIOPLASTY    . PERIPHERAL VASCULAR CATHETERIZATION N/A 07/11/2016   Procedure: Dialysis/Perma Catheter Insertion;  Surgeon: Algernon Huxley, MD;  Location: [place] CV [facility];  Service: Cardiovascular;  Laterality: N/A;  . PERIPHERAL VASCULAR THROMBECTOMY Left 05/07/2017   Procedure: Peripheral Vascular Thrombectomy;  Surgeon: Algernon Huxley, MD;  Location: [place] [facility];  Service: Cardiovascular;  Laterality: Left;   Patient Active Problem List   Diagnosis Date Noted  . Hypertensive emergency 01/14/2018  . Fluid overload 10/27/2017  . Chest pain 09/03/2017  . Elevated troponin 08/18/2017  . 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 Hypertensive urgency 04/01/2017  . ESRD (end stage renal disease) on dialysis ([place]) 07/19/2016  . Anxiety 07/19/2016  . Heart failure, diastolic, due to HTN ([place]) 07/19/2016  . Costochondritis 07/19/2016  . HTN (hypertension) 07/09/2016      Prior to Admission medications   Medication Sig Start Date End Date Taking? Authorizing Provider  amLODipine (NORVASC) 10 MG tablet Take 1 tablet (10 mg total) by mouth every evening. 07/10/17   Awilda Bill, NP  aspirin 81 MG tablet Take 1 tablet (81 mg total) by mouth daily. Patient not taking: Reported on 01/13/2018 09/04/17   Gladstone Lighter, MD  carvedilol (COREG) 25 MG tablet Take 1 tablet (25 mg total)  by mouth 2 (two) times daily with a meal. Patient not taking: Reported on 01/13/2018 07/10/17   Awilda Bill, NP  cloNIDine (CATAPRES) 0.2 MG tablet Take 1 tablet (0.2 mg total) by mouth 3 (three) times daily. Patient not taking: Reported on 03/29/2018 09/04/17   Gladstone Lighter, MD  hydrALAZINE (APRESOLINE) 50 MG tablet Take 1 tablet (50 mg total) by mouth 2 (two) times daily. Patient not taking: Reported on 03/29/2018 09/04/17   Gladstone Lighter, MD  losartan (COZAAR) 100 MG tablet Take 1 tablet (100 mg total) by mouth every  evening. 09/04/17   Gladstone Lighter, MD    Allergies Bee venom and Lasix [furosemide]    Social History Social History   Tobacco Use  . Smoking status: Current Every Day Smoker    Packs/day: 0.25    Years: 10.00    Pack years: 2.50    Types: Cigarettes    Last attempt to quit: 06/22/2016    Years since quitting: 1.9  . Smokeless tobacco: Never Used  Substance Use Topics  . Alcohol use: No  . Drug use: Yes    Types: Marijuana    Review of Systems Patient denies headaches, rhinorrhea, blurry vision, numbness, shortness of breath, chest pain, edema, cough, abdominal pain, nausea, vomiting, diarrhea, dysuria, fevers, rashes or hallucinations unless otherwise stated above in HPI. ____________________________________________   PHYSICAL EXAM:  VITAL SIGNS: Vitals:   05/21/18 2030 05/21/18 2046  BP: (!) 154/101   Pulse: 76 78  Resp:  18  Temp:    SpO2: 95% 97%    Constitutional: Alert and oriented.  Eyes: Conjunctivae are normal.  Head: Atraumatic. Nose: No congestion/rhinnorhea.  Small hemostatic nasal polyp in the left anterior nare Mouth/Throat: Mucous membranes are moist.   Neck: No stridor. Painless ROM.  Cardiovascular: Normal rate, regular rhythm. Grossly normal heart sounds.  Good peripheral circulation. Respiratory: Normal respiratory effort.  No retractions. Lungs CTAB. Gastrointestinal: Soft and nontender. No distention. No abdominal bruits. No CVA tenderness. Genitourinary:  Musculoskeletal: No lower extremity tenderness nor edema.  No joint effusions. Neurologic:  Normal speech and language. No gross focal neurologic deficits are appreciated. No facial droop Skin:  Skin is warm, dry and intact. No rash noted. Psychiatric: Mood and affect are normal. Speech and behavior are normal.  ____________________________________________   LABS (all labs ordered are listed, but only abnormal results are displayed)  Results for orders placed or performed during  the hospital encounter of 05/21/18 (from the past 24 hour(s))  Basic metabolic panel     Status: Abnormal   Collection Time: 05/21/18  4:44 PM  Result Value Ref Range   Sodium 138 135 - 145 mmol/L   Potassium 3.3 (L) 3.5 - 5.1 mmol/L   Chloride 96 (L) 98 - 111 mmol/L   CO2 31 22 - 32 mmol/L   Glucose, Bld 132 (H) 70 - 99 mg/dL   BUN 35 (H) 6 - 20 mg/dL   Creatinine, Ser 7.81 (H) 0.61 - 1.24 mg/dL   Calcium 8.9 8.9 - 10.3 mg/dL   GFR calc non Af Amer 8 (L) >60 mL/min   GFR calc Af Amer 9 (L) >60 mL/min   Anion gap 11 5 - 15  CBC     Status: Abnormal   Collection Time: 05/21/18  4:44 PM  Result Value Ref Range   WBC 8.4 3.8 - 10.6 K/uL   RBC 3.96 (L) 4.40 - 5.90 MIL/uL   Hemoglobin 12.5 (L) 13.0 - 18.0 g/dL   HCT  36.3 (L) 40.0 - 52.0 %   MCV 91.7 80.0 - 100.0 fL   MCH 31.6 26.0 - 34.0 pg   MCHC 34.5 32.0 - 36.0 g/dL   RDW 14.6 (H) 11.5 - 14.5 %   Platelets 155 150 - 440 K/uL  Troponin I     Status: Abnormal   Collection Time: 05/21/18  4:44 PM  Result Value Ref Range   Troponin I 0.12 (HH) <0.03 ng/mL  Troponin I     Status: Abnormal   Collection Time: 05/21/18  7:42 PM  Result Value Ref Range   Troponin I 0.12 (HH) <0.03 ng/mL   ____________________________________________  EKG My review and personal interpretation at Time: 16:38   Indication: htn  Rate: 80  Rhythm: sinus Axis: normal Other: normal intervals, no stemi, lvh criteria with t wave inversion in 1/avl and V6 unchanged from previous ecg on 01/13/18 ____________________________________________  RADIOLOGY  I personally reviewed all radiographic images ordered to evaluate for the above acute complaints and reviewed radiology reports and findings.  These findings were personally discussed with the patient.  Please see medical record for radiology report.  ____________________________________________   PROCEDURES  Procedure(s) performed:  Procedures    Critical Care performed:  no ____________________________________________   INITIAL IMPRESSION / ASSESSMENT AND PLAN / ED COURSE  Pertinent labs & imaging results that were available during my care of the patient were reviewed by me and considered in my medical decision making (see chart for details).   DDX: Lecture light abnormality, ACS, dissection, PE, congestive heart failure, flash pulmonary edema, Churg-Strauss, polyp, posterior nasal bleed  FLORENCIO HOLLIBAUGH is a 32 y.o. who presents to the ED with symptoms as described above.  Patient nontoxic-appearing he is hypertensive and will require re-dosing his home medications.  Patient presenting after third shift and states he did not take any medication since this morning.  He is been compliant with his dialysis.  EKG unchanged from previous.  Troponin elevated to 0.12 which is roughly at his baseline and given his symptoms we will repeat as he is certainly at high risk for ACS but clinically does not appear to be complaining of any signs or symptoms of ACS.  No evidence of active bleed at this time.  Not clinically consistent with PE or dissection.  No evidence of pneumonia or congestive heart failure at this time.  Clinical Course as of May 21 2342  Thu May 21, 2018  2039 Repeat troponin is unchanged.  Patient remains hemodynamically stable.  Blood pressure improved with starting his home meds.  Patient remains asymptomatic.  He stable and appropriate for outpatient follow-up.   [PR]    Clinical Course User Index [PR] Merlyn Lot, MD     As part of my medical decision making, I reviewed the following data within the [place] notes reviewed and incorporated, Labs reviewed, notes from prior ED visits and [place] Controlled Substance Database   ____________________________________________   FINAL CLINICAL IMPRESSION(S) / ED DIAGNOSES  Final diagnoses:  Epistaxis  Hypertension, unspecified type  ESRD on dialysis [facility])       NEW MEDICATIONS STARTED DURING THIS VISIT:  Discharge Medication List as of 05/21/2018  8:40 PM       Note:  This document was prepared using Dragon voice recognition software and may include unintentional dictation errors.    Merlyn Lot, MD 05/21/18 [phone]

## 2018-05-21 NOTE — ED Notes (Signed)

## 2018-05-21 NOTE — ED Notes (Signed)
ED Provider at bedside. 

## 2018-05-21 NOTE — ED Triage Notes (Signed)
Pt comes into the ED via POV c/o chest pain and hemoptysis and a nosebleed x 3 days.  Patient states he is a dialysis patient.  Patient states he has mild dizziness, but denies any N/V, or shortness of breath.  Patient has even and unlabored respirations.  Last dialysis treatment was Wednesday.

## 2018-07-19 DIAGNOSIS — N186 End stage renal disease: Secondary | ICD-10-CM | POA: Diagnosis not present

## 2018-07-19 DIAGNOSIS — Z992 Dependence on renal dialysis: Secondary | ICD-10-CM | POA: Diagnosis not present

## 2018-07-19 DIAGNOSIS — I129 Hypertensive chronic kidney disease with stage 1 through stage 4 chronic kidney disease, or unspecified chronic kidney disease: Secondary | ICD-10-CM | POA: Diagnosis not present

## 2018-07-20 DIAGNOSIS — N186 End stage renal disease: Secondary | ICD-10-CM | POA: Diagnosis not present

## 2018-07-20 DIAGNOSIS — N2581 Secondary hyperparathyroidism of renal origin: Secondary | ICD-10-CM | POA: Diagnosis not present

## 2018-07-20 DIAGNOSIS — E118 Type 2 diabetes mellitus with unspecified complications: Secondary | ICD-10-CM | POA: Diagnosis not present

## 2018-07-20 DIAGNOSIS — D509 Iron deficiency anemia, unspecified: Secondary | ICD-10-CM | POA: Diagnosis not present

## 2018-07-22 DIAGNOSIS — E118 Type 2 diabetes mellitus with unspecified complications: Secondary | ICD-10-CM | POA: Diagnosis not present

## 2018-07-22 DIAGNOSIS — D509 Iron deficiency anemia, unspecified: Secondary | ICD-10-CM | POA: Diagnosis not present

## 2018-07-22 DIAGNOSIS — N2581 Secondary hyperparathyroidism of renal origin: Secondary | ICD-10-CM | POA: Diagnosis not present

## 2018-07-22 DIAGNOSIS — N186 End stage renal disease: Secondary | ICD-10-CM | POA: Diagnosis not present

## 2018-07-25 DIAGNOSIS — N2581 Secondary hyperparathyroidism of renal origin: Secondary | ICD-10-CM | POA: Diagnosis not present

## 2018-07-25 DIAGNOSIS — D509 Iron deficiency anemia, unspecified: Secondary | ICD-10-CM | POA: Diagnosis not present

## 2018-07-25 DIAGNOSIS — E118 Type 2 diabetes mellitus with unspecified complications: Secondary | ICD-10-CM | POA: Diagnosis not present

## 2018-07-25 DIAGNOSIS — N186 End stage renal disease: Secondary | ICD-10-CM | POA: Diagnosis not present

## 2018-07-29 DIAGNOSIS — D509 Iron deficiency anemia, unspecified: Secondary | ICD-10-CM | POA: Diagnosis not present

## 2018-07-29 DIAGNOSIS — E118 Type 2 diabetes mellitus with unspecified complications: Secondary | ICD-10-CM | POA: Diagnosis not present

## 2018-07-29 DIAGNOSIS — N2581 Secondary hyperparathyroidism of renal origin: Secondary | ICD-10-CM | POA: Diagnosis not present

## 2018-07-29 DIAGNOSIS — N186 End stage renal disease: Secondary | ICD-10-CM | POA: Diagnosis not present

## 2018-07-31 DIAGNOSIS — D509 Iron deficiency anemia, unspecified: Secondary | ICD-10-CM | POA: Diagnosis not present

## 2018-07-31 DIAGNOSIS — N186 End stage renal disease: Secondary | ICD-10-CM | POA: Diagnosis not present

## 2018-07-31 DIAGNOSIS — E118 Type 2 diabetes mellitus with unspecified complications: Secondary | ICD-10-CM | POA: Diagnosis not present

## 2018-07-31 DIAGNOSIS — N2581 Secondary hyperparathyroidism of renal origin: Secondary | ICD-10-CM | POA: Diagnosis not present

## 2018-08-03 DIAGNOSIS — E118 Type 2 diabetes mellitus with unspecified complications: Secondary | ICD-10-CM | POA: Diagnosis not present

## 2018-08-03 DIAGNOSIS — D509 Iron deficiency anemia, unspecified: Secondary | ICD-10-CM | POA: Diagnosis not present

## 2018-08-03 DIAGNOSIS — N2581 Secondary hyperparathyroidism of renal origin: Secondary | ICD-10-CM | POA: Diagnosis not present

## 2018-08-03 DIAGNOSIS — N186 End stage renal disease: Secondary | ICD-10-CM | POA: Diagnosis not present

## 2018-08-05 DIAGNOSIS — D509 Iron deficiency anemia, unspecified: Secondary | ICD-10-CM | POA: Diagnosis not present

## 2018-08-05 DIAGNOSIS — N186 End stage renal disease: Secondary | ICD-10-CM | POA: Diagnosis not present

## 2018-08-05 DIAGNOSIS — E118 Type 2 diabetes mellitus with unspecified complications: Secondary | ICD-10-CM | POA: Diagnosis not present

## 2018-08-05 DIAGNOSIS — N2581 Secondary hyperparathyroidism of renal origin: Secondary | ICD-10-CM | POA: Diagnosis not present

## 2018-08-08 DIAGNOSIS — N2581 Secondary hyperparathyroidism of renal origin: Secondary | ICD-10-CM | POA: Diagnosis not present

## 2018-08-08 DIAGNOSIS — E118 Type 2 diabetes mellitus with unspecified complications: Secondary | ICD-10-CM | POA: Diagnosis not present

## 2018-08-08 DIAGNOSIS — N186 End stage renal disease: Secondary | ICD-10-CM | POA: Diagnosis not present

## 2018-08-08 DIAGNOSIS — D509 Iron deficiency anemia, unspecified: Secondary | ICD-10-CM | POA: Diagnosis not present

## 2018-08-12 DIAGNOSIS — E118 Type 2 diabetes mellitus with unspecified complications: Secondary | ICD-10-CM | POA: Diagnosis not present

## 2018-08-12 DIAGNOSIS — N186 End stage renal disease: Secondary | ICD-10-CM | POA: Diagnosis not present

## 2018-08-12 DIAGNOSIS — D509 Iron deficiency anemia, unspecified: Secondary | ICD-10-CM | POA: Diagnosis not present

## 2018-08-12 DIAGNOSIS — N2581 Secondary hyperparathyroidism of renal origin: Secondary | ICD-10-CM | POA: Diagnosis not present

## 2018-08-17 DIAGNOSIS — D509 Iron deficiency anemia, unspecified: Secondary | ICD-10-CM | POA: Diagnosis not present

## 2018-08-17 DIAGNOSIS — N2581 Secondary hyperparathyroidism of renal origin: Secondary | ICD-10-CM | POA: Diagnosis not present

## 2018-08-17 DIAGNOSIS — E118 Type 2 diabetes mellitus with unspecified complications: Secondary | ICD-10-CM | POA: Diagnosis not present

## 2018-08-17 DIAGNOSIS — N186 End stage renal disease: Secondary | ICD-10-CM | POA: Diagnosis not present

## 2018-08-18 DIAGNOSIS — N186 End stage renal disease: Secondary | ICD-10-CM | POA: Diagnosis not present

## 2018-08-18 DIAGNOSIS — Z992 Dependence on renal dialysis: Secondary | ICD-10-CM | POA: Diagnosis not present

## 2018-08-18 DIAGNOSIS — I129 Hypertensive chronic kidney disease with stage 1 through stage 4 chronic kidney disease, or unspecified chronic kidney disease: Secondary | ICD-10-CM | POA: Diagnosis not present

## 2018-08-19 DIAGNOSIS — N186 End stage renal disease: Secondary | ICD-10-CM | POA: Diagnosis not present

## 2018-08-19 DIAGNOSIS — N2581 Secondary hyperparathyroidism of renal origin: Secondary | ICD-10-CM | POA: Diagnosis not present

## 2018-08-19 DIAGNOSIS — D509 Iron deficiency anemia, unspecified: Secondary | ICD-10-CM | POA: Diagnosis not present

## 2018-08-19 DIAGNOSIS — E118 Type 2 diabetes mellitus with unspecified complications: Secondary | ICD-10-CM | POA: Diagnosis not present

## 2018-08-26 DIAGNOSIS — N2581 Secondary hyperparathyroidism of renal origin: Secondary | ICD-10-CM | POA: Diagnosis not present

## 2018-08-26 DIAGNOSIS — N186 End stage renal disease: Secondary | ICD-10-CM | POA: Diagnosis not present

## 2018-08-26 DIAGNOSIS — E118 Type 2 diabetes mellitus with unspecified complications: Secondary | ICD-10-CM | POA: Diagnosis not present

## 2018-08-26 DIAGNOSIS — D509 Iron deficiency anemia, unspecified: Secondary | ICD-10-CM | POA: Diagnosis not present

## 2018-08-28 DIAGNOSIS — E118 Type 2 diabetes mellitus with unspecified complications: Secondary | ICD-10-CM | POA: Diagnosis not present

## 2018-08-28 DIAGNOSIS — N186 End stage renal disease: Secondary | ICD-10-CM | POA: Diagnosis not present

## 2018-08-28 DIAGNOSIS — D509 Iron deficiency anemia, unspecified: Secondary | ICD-10-CM | POA: Diagnosis not present

## 2018-08-28 DIAGNOSIS — N2581 Secondary hyperparathyroidism of renal origin: Secondary | ICD-10-CM | POA: Diagnosis not present

## 2018-08-31 DIAGNOSIS — D509 Iron deficiency anemia, unspecified: Secondary | ICD-10-CM | POA: Diagnosis not present

## 2018-08-31 DIAGNOSIS — N186 End stage renal disease: Secondary | ICD-10-CM | POA: Diagnosis not present

## 2018-08-31 DIAGNOSIS — E118 Type 2 diabetes mellitus with unspecified complications: Secondary | ICD-10-CM | POA: Diagnosis not present

## 2018-08-31 DIAGNOSIS — N2581 Secondary hyperparathyroidism of renal origin: Secondary | ICD-10-CM | POA: Diagnosis not present

## 2018-09-02 DIAGNOSIS — E118 Type 2 diabetes mellitus with unspecified complications: Secondary | ICD-10-CM | POA: Diagnosis not present

## 2018-09-02 DIAGNOSIS — N186 End stage renal disease: Secondary | ICD-10-CM | POA: Diagnosis not present

## 2018-09-02 DIAGNOSIS — N2581 Secondary hyperparathyroidism of renal origin: Secondary | ICD-10-CM | POA: Diagnosis not present

## 2018-09-02 DIAGNOSIS — D509 Iron deficiency anemia, unspecified: Secondary | ICD-10-CM | POA: Diagnosis not present

## 2018-09-08 DIAGNOSIS — E118 Type 2 diabetes mellitus with unspecified complications: Secondary | ICD-10-CM | POA: Diagnosis not present

## 2018-09-08 DIAGNOSIS — N2581 Secondary hyperparathyroidism of renal origin: Secondary | ICD-10-CM | POA: Diagnosis not present

## 2018-09-08 DIAGNOSIS — N186 End stage renal disease: Secondary | ICD-10-CM | POA: Diagnosis not present

## 2018-09-08 DIAGNOSIS — D509 Iron deficiency anemia, unspecified: Secondary | ICD-10-CM | POA: Diagnosis not present

## 2018-09-11 DIAGNOSIS — E118 Type 2 diabetes mellitus with unspecified complications: Secondary | ICD-10-CM | POA: Diagnosis not present

## 2018-09-11 DIAGNOSIS — D509 Iron deficiency anemia, unspecified: Secondary | ICD-10-CM | POA: Diagnosis not present

## 2018-09-11 DIAGNOSIS — N2581 Secondary hyperparathyroidism of renal origin: Secondary | ICD-10-CM | POA: Diagnosis not present

## 2018-09-11 DIAGNOSIS — N186 End stage renal disease: Secondary | ICD-10-CM | POA: Diagnosis not present

## 2018-09-14 DIAGNOSIS — D509 Iron deficiency anemia, unspecified: Secondary | ICD-10-CM | POA: Diagnosis not present

## 2018-09-14 DIAGNOSIS — N2581 Secondary hyperparathyroidism of renal origin: Secondary | ICD-10-CM | POA: Diagnosis not present

## 2018-09-14 DIAGNOSIS — E118 Type 2 diabetes mellitus with unspecified complications: Secondary | ICD-10-CM | POA: Diagnosis not present

## 2018-09-14 DIAGNOSIS — N186 End stage renal disease: Secondary | ICD-10-CM | POA: Diagnosis not present

## 2018-09-16 DIAGNOSIS — N186 End stage renal disease: Secondary | ICD-10-CM | POA: Diagnosis not present

## 2018-09-16 DIAGNOSIS — E118 Type 2 diabetes mellitus with unspecified complications: Secondary | ICD-10-CM | POA: Diagnosis not present

## 2018-09-16 DIAGNOSIS — N2581 Secondary hyperparathyroidism of renal origin: Secondary | ICD-10-CM | POA: Diagnosis not present

## 2018-09-16 DIAGNOSIS — D509 Iron deficiency anemia, unspecified: Secondary | ICD-10-CM | POA: Diagnosis not present

## 2018-09-18 DIAGNOSIS — Z992 Dependence on renal dialysis: Secondary | ICD-10-CM | POA: Diagnosis not present

## 2018-09-18 DIAGNOSIS — N186 End stage renal disease: Secondary | ICD-10-CM | POA: Diagnosis not present

## 2018-09-18 DIAGNOSIS — I129 Hypertensive chronic kidney disease with stage 1 through stage 4 chronic kidney disease, or unspecified chronic kidney disease: Secondary | ICD-10-CM | POA: Diagnosis not present

## 2018-09-22 DIAGNOSIS — D509 Iron deficiency anemia, unspecified: Secondary | ICD-10-CM | POA: Diagnosis not present

## 2018-09-22 DIAGNOSIS — N186 End stage renal disease: Secondary | ICD-10-CM | POA: Diagnosis not present

## 2018-09-22 DIAGNOSIS — N2581 Secondary hyperparathyroidism of renal origin: Secondary | ICD-10-CM | POA: Diagnosis not present

## 2018-09-22 DIAGNOSIS — E118 Type 2 diabetes mellitus with unspecified complications: Secondary | ICD-10-CM | POA: Diagnosis not present

## 2018-09-23 DIAGNOSIS — N186 End stage renal disease: Secondary | ICD-10-CM | POA: Diagnosis not present

## 2018-09-23 DIAGNOSIS — D509 Iron deficiency anemia, unspecified: Secondary | ICD-10-CM | POA: Diagnosis not present

## 2018-09-23 DIAGNOSIS — N2581 Secondary hyperparathyroidism of renal origin: Secondary | ICD-10-CM | POA: Diagnosis not present

## 2018-09-23 DIAGNOSIS — E118 Type 2 diabetes mellitus with unspecified complications: Secondary | ICD-10-CM | POA: Diagnosis not present

## 2018-09-25 DIAGNOSIS — N186 End stage renal disease: Secondary | ICD-10-CM | POA: Diagnosis not present

## 2018-09-25 DIAGNOSIS — E118 Type 2 diabetes mellitus with unspecified complications: Secondary | ICD-10-CM | POA: Diagnosis not present

## 2018-09-25 DIAGNOSIS — D509 Iron deficiency anemia, unspecified: Secondary | ICD-10-CM | POA: Diagnosis not present

## 2018-09-25 DIAGNOSIS — N2581 Secondary hyperparathyroidism of renal origin: Secondary | ICD-10-CM | POA: Diagnosis not present

## 2018-09-28 DIAGNOSIS — D509 Iron deficiency anemia, unspecified: Secondary | ICD-10-CM | POA: Diagnosis not present

## 2018-09-28 DIAGNOSIS — N186 End stage renal disease: Secondary | ICD-10-CM | POA: Diagnosis not present

## 2018-09-28 DIAGNOSIS — E118 Type 2 diabetes mellitus with unspecified complications: Secondary | ICD-10-CM | POA: Diagnosis not present

## 2018-09-28 DIAGNOSIS — N2581 Secondary hyperparathyroidism of renal origin: Secondary | ICD-10-CM | POA: Diagnosis not present

## 2018-09-30 DIAGNOSIS — N186 End stage renal disease: Secondary | ICD-10-CM | POA: Diagnosis not present

## 2018-09-30 DIAGNOSIS — D509 Iron deficiency anemia, unspecified: Secondary | ICD-10-CM | POA: Diagnosis not present

## 2018-09-30 DIAGNOSIS — E118 Type 2 diabetes mellitus with unspecified complications: Secondary | ICD-10-CM | POA: Diagnosis not present

## 2018-09-30 DIAGNOSIS — N2581 Secondary hyperparathyroidism of renal origin: Secondary | ICD-10-CM | POA: Diagnosis not present

## 2018-10-02 DIAGNOSIS — D509 Iron deficiency anemia, unspecified: Secondary | ICD-10-CM | POA: Diagnosis not present

## 2018-10-02 DIAGNOSIS — N186 End stage renal disease: Secondary | ICD-10-CM | POA: Diagnosis not present

## 2018-10-02 DIAGNOSIS — E118 Type 2 diabetes mellitus with unspecified complications: Secondary | ICD-10-CM | POA: Diagnosis not present

## 2018-10-02 DIAGNOSIS — N2581 Secondary hyperparathyroidism of renal origin: Secondary | ICD-10-CM | POA: Diagnosis not present

## 2018-10-05 DIAGNOSIS — N186 End stage renal disease: Secondary | ICD-10-CM | POA: Diagnosis not present

## 2018-10-05 DIAGNOSIS — D509 Iron deficiency anemia, unspecified: Secondary | ICD-10-CM | POA: Diagnosis not present

## 2018-10-05 DIAGNOSIS — N2581 Secondary hyperparathyroidism of renal origin: Secondary | ICD-10-CM | POA: Diagnosis not present

## 2018-10-05 DIAGNOSIS — E118 Type 2 diabetes mellitus with unspecified complications: Secondary | ICD-10-CM | POA: Diagnosis not present

## 2018-10-07 DIAGNOSIS — N186 End stage renal disease: Secondary | ICD-10-CM | POA: Diagnosis not present

## 2018-10-07 DIAGNOSIS — D509 Iron deficiency anemia, unspecified: Secondary | ICD-10-CM | POA: Diagnosis not present

## 2018-10-07 DIAGNOSIS — N2581 Secondary hyperparathyroidism of renal origin: Secondary | ICD-10-CM | POA: Diagnosis not present

## 2018-10-07 DIAGNOSIS — E118 Type 2 diabetes mellitus with unspecified complications: Secondary | ICD-10-CM | POA: Diagnosis not present

## 2018-10-09 ENCOUNTER — Observation Stay
Admission: EM | Admit: 2018-10-09 | Discharge: 2018-10-09 | Discharge status: Against Medical Advice | Payer: Medicare Other | Attending: Internal Medicine | Admitting: Internal Medicine

## 2018-10-09 ENCOUNTER — Other Ambulatory Visit: Payer: Self-pay

## 2018-10-09 ENCOUNTER — Encounter: Payer: Self-pay | Admitting: Emergency Medicine

## 2018-10-09 ENCOUNTER — Emergency Department: Payer: Medicare Other

## 2018-10-09 DIAGNOSIS — N186 End stage renal disease: Secondary | ICD-10-CM | POA: Diagnosis not present

## 2018-10-09 DIAGNOSIS — Z720 Tobacco use: Secondary | ICD-10-CM | POA: Diagnosis not present

## 2018-10-09 DIAGNOSIS — R079 Chest pain, unspecified: Secondary | ICD-10-CM | POA: Diagnosis present

## 2018-10-09 DIAGNOSIS — R0789 Other chest pain: Secondary | ICD-10-CM | POA: Insufficient documentation

## 2018-10-09 DIAGNOSIS — D509 Iron deficiency anemia, unspecified: Secondary | ICD-10-CM | POA: Diagnosis not present

## 2018-10-09 DIAGNOSIS — Z8673 Personal history of transient ischemic attack (TIA), and cerebral infarction without residual deficits: Secondary | ICD-10-CM | POA: Diagnosis not present

## 2018-10-09 DIAGNOSIS — F419 Anxiety disorder, unspecified: Secondary | ICD-10-CM | POA: Diagnosis not present

## 2018-10-09 DIAGNOSIS — I132 Hypertensive heart and chronic kidney disease with heart failure and with stage 5 chronic kidney disease, or end stage renal disease: Secondary | ICD-10-CM | POA: Diagnosis not present

## 2018-10-09 DIAGNOSIS — F329 Major depressive disorder, single episode, unspecified: Secondary | ICD-10-CM | POA: Insufficient documentation

## 2018-10-09 DIAGNOSIS — Z992 Dependence on renal dialysis: Secondary | ICD-10-CM | POA: Insufficient documentation

## 2018-10-09 DIAGNOSIS — Z79899 Other long term (current) drug therapy: Secondary | ICD-10-CM | POA: Diagnosis not present

## 2018-10-09 DIAGNOSIS — I5030 Unspecified diastolic (congestive) heart failure: Secondary | ICD-10-CM | POA: Insufficient documentation

## 2018-10-09 DIAGNOSIS — N2581 Secondary hyperparathyroidism of renal origin: Secondary | ICD-10-CM | POA: Diagnosis not present

## 2018-10-09 DIAGNOSIS — Z7982 Long term (current) use of aspirin: Secondary | ICD-10-CM | POA: Diagnosis not present

## 2018-10-09 DIAGNOSIS — F17210 Nicotine dependence, cigarettes, uncomplicated: Secondary | ICD-10-CM | POA: Insufficient documentation

## 2018-10-09 DIAGNOSIS — R7989 Other specified abnormal findings of blood chemistry: Secondary | ICD-10-CM | POA: Insufficient documentation

## 2018-10-09 DIAGNOSIS — Z888 Allergy status to other drugs, medicaments and biological substances status: Secondary | ICD-10-CM | POA: Diagnosis not present

## 2018-10-09 DIAGNOSIS — E118 Type 2 diabetes mellitus with unspecified complications: Secondary | ICD-10-CM | POA: Diagnosis not present

## 2018-10-09 DIAGNOSIS — I160 Hypertensive urgency: Secondary | ICD-10-CM | POA: Diagnosis not present

## 2018-10-09 LAB — CBC WITH DIFFERENTIAL/PLATELET
ABS IMMATURE GRANULOCYTES: 0.04 10*3/uL (ref 0.00–0.07)
Basophils Absolute: 0.1 10*3/uL (ref 0.0–0.1)
Basophils Relative: 1 %
Eosinophils Absolute: 0.2 10*3/uL (ref 0.0–0.5)
Eosinophils Relative: 2 %
HEMATOCRIT: 34.3 % — AB (ref 39.0–52.0)
Hemoglobin: 11.4 g/dL — ABNORMAL LOW (ref 13.0–17.0)
IMMATURE GRANULOCYTES: 1 %
LYMPHS ABS: 1.5 10*3/uL (ref 0.7–4.0)
Lymphocytes Relative: 17 %
MCH: 30.7 pg (ref 26.0–34.0)
MCHC: 33.2 g/dL (ref 30.0–36.0)
MCV: 92.5 fL (ref 80.0–100.0)
MONO ABS: 0.7 10*3/uL (ref 0.1–1.0)
MONOS PCT: 8 %
NEUTROS ABS: 6.2 10*3/uL (ref 1.7–7.7)
NEUTROS PCT: 71 %
PLATELETS: 169 10*3/uL (ref 150–400)
RBC: 3.71 MIL/uL — ABNORMAL LOW (ref 4.22–5.81)
RDW: 13.5 % (ref 11.5–15.5)
WBC: 8.6 10*3/uL (ref 4.0–10.5)
nRBC: 0 % (ref 0.0–0.2)

## 2018-10-09 LAB — BASIC METABOLIC PANEL
Anion gap: 16 — ABNORMAL HIGH (ref 5–15)
BUN: 25 mg/dL — AB (ref 6–20)
CALCIUM: 9.2 mg/dL (ref 8.9–10.3)
CO2: 29 mmol/L (ref 22–32)
CREATININE: 6.3 mg/dL — AB (ref 0.61–1.24)
Chloride: 96 mmol/L — ABNORMAL LOW (ref 98–111)
GFR calc non Af Amer: 10 mL/min — ABNORMAL LOW (ref >60)
GFR, EST AFRICAN AMERICAN: 12 mL/min — AB (ref >60)
Glucose, Bld: 100 mg/dL — ABNORMAL HIGH (ref 70–99)
Potassium: 3.6 mmol/L (ref 3.5–5.1)
SODIUM: 141 mmol/L (ref 135–145)

## 2018-10-09 LAB — TROPONIN I
TROPONIN I: 0.1 ng/mL — AB (ref <0.03)
TROPONIN I: 0.1 ng/mL — AB (ref <0.03)

## 2018-10-09 LAB — MRSA PCR SCREENING: MRSA by PCR: NEGATIVE

## 2018-10-09 MED ORDER — ASPIRIN EC 81 MG PO TBEC: 81.0000 mg | DELAYED_RELEASE_TABLET | Freq: Every day | ORAL | Status: DC

## 2018-10-09 MED ORDER — HYDRALAZINE HCL 50 MG PO TABS: 50.0000 mg | ORAL_TABLET | Freq: Two times a day (BID) | ORAL | Status: DC

## 2018-10-09 MED ORDER — HYDRALAZINE HCL 50 MG PO TABS
50.0000 mg | ORAL_TABLET | Freq: Once | ORAL | Status: AC
  Administered 2018-10-09: 50 mg via ORAL
  Filled 2018-10-09: qty 1

## 2018-10-09 MED ORDER — AMLODIPINE BESYLATE 5 MG PO TABS
10.0000 mg | ORAL_TABLET | Freq: Every evening | ORAL | Status: DC
  Administered 2018-10-09: 10 mg via ORAL
  Filled 2018-10-09: qty 2

## 2018-10-09 MED ORDER — MORPHINE SULFATE (PF) 4 MG/ML IV SOLN
4.0000 mg | INTRAVENOUS | Status: DC | PRN
  Administered 2018-10-09: 4 mg via INTRAVENOUS
  Filled 2018-10-09: qty 1

## 2018-10-09 MED ORDER — ASPIRIN 81 MG PO CHEW
324.0000 mg | CHEWABLE_TABLET | ORAL | Status: AC
  Administered 2018-10-09: 324 mg via ORAL
  Filled 2018-10-09: qty 4

## 2018-10-09 MED ORDER — ACETAMINOPHEN 325 MG PO TABS: 650.0000 mg | ORAL_TABLET | ORAL | Status: DC | PRN

## 2018-10-09 MED ORDER — HEPARIN SODIUM (PORCINE) 5000 UNIT/ML IJ SOLN: 5000.0000 [IU] | Freq: Three times a day (TID) | INTRAMUSCULAR | Status: DC

## 2018-10-09 MED ORDER — SODIUM CHLORIDE 0.9 % IV SOLN: 250.0000 mL | INTRAVENOUS | Status: DC | PRN

## 2018-10-09 MED ORDER — NITROGLYCERIN 0.4 MG SL SUBL: 0.4000 mg | SUBLINGUAL_TABLET | SUBLINGUAL | Status: DC | PRN

## 2018-10-09 MED ORDER — CLONIDINE HCL 0.1 MG PO TABS
0.2000 mg | ORAL_TABLET | Freq: Once | ORAL | Status: AC
  Administered 2018-10-09: 0.2 mg via ORAL
  Filled 2018-10-09: qty 2

## 2018-10-09 MED ORDER — ASPIRIN 300 MG RE SUPP: 300.0000 mg | RECTAL | Status: AC

## 2018-10-09 MED ORDER — LOSARTAN POTASSIUM 50 MG PO TABS
100.0000 mg | ORAL_TABLET | Freq: Every evening | ORAL | Status: DC
  Administered 2018-10-09: 100 mg via ORAL
  Filled 2018-10-09: qty 2

## 2018-10-09 MED ORDER — SODIUM CHLORIDE 0.9% FLUSH: 3.0000 mL | Freq: Two times a day (BID) | INTRAVENOUS | Status: DC

## 2018-10-09 MED ORDER — CARVEDILOL 25 MG PO TABS: 25.0000 mg | ORAL_TABLET | Freq: Two times a day (BID) | ORAL | Status: DC

## 2018-10-09 MED ORDER — SODIUM CHLORIDE 0.9% FLUSH: 3.0000 mL | INTRAVENOUS | Status: DC | PRN

## 2018-10-09 MED ORDER — CLONIDINE HCL 0.1 MG PO TABS: 0.2000 mg | ORAL_TABLET | Freq: Three times a day (TID) | ORAL | Status: DC

## 2018-10-09 MED ORDER — ONDANSETRON HCL 4 MG/2ML IJ SOLN: 4.0000 mg | Freq: Four times a day (QID) | INTRAMUSCULAR | Status: DC | PRN

## 2018-10-09 MED ORDER — CARVEDILOL 25 MG PO TABS
25.0000 mg | ORAL_TABLET | Freq: Two times a day (BID) | ORAL | Status: DC
  Administered 2018-10-09: 25 mg via ORAL
  Filled 2018-10-09: qty 1

## 2018-10-09 MED ORDER — ONDANSETRON HCL 4 MG/2ML IJ SOLN
4.0000 mg | Freq: Once | INTRAMUSCULAR | Status: AC
  Administered 2018-10-09: 4 mg via INTRAVENOUS
  Filled 2018-10-09: qty 2

## 2018-10-09 MED ORDER — LABETALOL HCL 5 MG/ML IV SOLN
10.0000 mg | Freq: Once | INTRAVENOUS | Status: AC
  Administered 2018-10-09: 10 mg via INTRAVENOUS
  Filled 2018-10-09: qty 4

## 2018-10-09 NOTE — ED Notes (Signed)
Date and time results received: 10/09/18 3:01 PM   Test: Troponin Critical Value: 0.10  Name of Provider Notified: Dr. Quentin Cornwall  Orders Received? Or Actions Taken?: Orders Received - See Orders for details

## 2018-10-09 NOTE — Care Management Obs Status (Signed)
MEDICARE OBSERVATION STATUS NOTIFICATION   Patient Details  Name: Alan Gross MRN: [id number] Date of Birth: [birth year]   Medicare Observation Status Notification Given:  Yes    Elza Rafter, RN 10/09/2018, 5:30 PM

## 2018-10-09 NOTE — Progress Notes (Signed)
Advanced care plan.  Purpose of the Encounter: CODE STATUS  Parties in Attendance: Patient and family  Patient's Decision Capacity: Good  Subjective/Patient's story: Presented to emergency room for chest pain and elevated blood pressure   Objective/Medical story Patient has hypertensive urgency Needs dialysis and control of blood pressure   Goals of care determination:  Advance care directives and goals of care and treatment plan discussed Patient wants everything done which includes CPR, intubation ventilator if the need arises   CODE STATUS: Full code   Time spent discussing advanced care planning: 16 minutes

## 2018-10-09 NOTE — ED Notes (Signed)
Pt wife pulled this RN to the side and asked "Is there any way possible that an STD check could be run on her husband. We just got back together recently and I am just looking out for his health. I have had bacterial vaginosis twice, and I'm not like this, so I just want to make sure". MD made aware.

## 2018-10-09 NOTE — ED Triage Notes (Signed)
Pt Arrives via [facility] from dialysis with c/o left sided chest pain. EMS reports pt having several PVC's on monitor. Dialysis center reported to ems that blood pressure was elevated with systolic pressures in 465K. 5000 unit heparin given with dialysis tx. Pt has hx of hypertension and cardiac catherizations. Dialysis access present in left arm on arriaval.

## 2018-10-09 NOTE — ED Notes (Signed)
Pt given [place] sandwich and water to drink. MD aware.

## 2018-10-09 NOTE — H&P (Addendum)
[place] [facility] [place] NAME: Alan Gross    MR#:  [id number]  DATE OF BIRTH:  [birth year]  DATE OF ADMISSION:  10/09/2018  PRIMARY CARE PHYSICIAN: Patient, No Pcp Per   REQUESTING/REFERRING PHYSICIAN:   CHIEF COMPLAINT:   Chief Complaint  Patient presents with  . Chest Pain    HISTORY OF PRESENT ILLNESS: Alan Gross  is a 32 y.o. male with a known history of end-stage renal disease on dialysis, hypertension, anxiety disorder presented to the emergency room for chest discomfort.  Patient had his chest pain this morning aching in nature 3 out of 10 scale of 1-10.  When patient presented to the emergency room his blood pressure was very high systolic around 242 millimeters of mercury and diastolic around 683 mmHg.  Patient received IV labetalol push in the emergency room and blood pressure was controlled.  He gets dialyzed on Monday Wednesday Friday and did not missed dialysis.  Troponin is elevated.  Hospitalist service was consulted for further care.  PAST MEDICAL HISTORY:   Past Medical History:  Diagnosis Date  . Anxiety   . Depression   . Dyspnea   . ESRD (end stage renal disease) ([place])   . H/O cardiac catheterization   . History of febrile seizure   . Hypertension   . Irregular heart beats   . Kidney failure    M,W,F [facility] in [place]; 15 %   . Stroke (cerebrum) ([facility])    no residual effects    PAST SURGICAL HISTORY:  Past Surgical History:  Procedure Laterality Date  . A/V FISTULAGRAM N/A 05/07/2017   Procedure: A/V Fistulagram;  Surgeon: Algernon Huxley, MD;  Location: [place] [facility];  Service: Cardiovascular;  Laterality: N/A;  . AV FISTULA PLACEMENT Left 09/10/2016   Procedure: LEFT ARTERIOVENOUS (AV) FISTULA CREATION;  Surgeon: Conrad Deschutes, MD;  Location: [place];  Service: Vascular;  Laterality: Left;  . CARDIAC CATHETERIZATION Left 05/02/2015   Procedure: Left Heart Cath and Coronary Angiography;  Surgeon:  Dionisio David, MD;  Location: [place] [facility];  Service: Cardiovascular;  Laterality: Left;  . CORONARY ANGIOPLASTY    . PERIPHERAL VASCULAR CATHETERIZATION N/A 07/11/2016   Procedure: Dialysis/Perma Catheter Insertion;  Surgeon: Algernon Huxley, MD;  Location: [place] [facility];  Service: Cardiovascular;  Laterality: N/A;  . PERIPHERAL VASCULAR THROMBECTOMY Left 05/07/2017   Procedure: Peripheral Vascular Thrombectomy;  Surgeon: Algernon Huxley, MD;  Location: [place] [facility];  Service: Cardiovascular;  Laterality: Left;    SOCIAL HISTORY:  Social History   Tobacco Use  . Smoking status: Current Every Day Smoker    Packs/day: 0.25    Years: 10.00    Pack years: 2.50    Types: Cigarettes    Last attempt to quit: 06/22/2016    Years since quitting: 2.2  . Smokeless tobacco: Never Used  Substance Use Topics  . Alcohol use: No    FAMILY HISTORY:  Family History  Problem Relation Age of Onset  . Hypertension Other   . Cancer Father        liver  . Hypertension Brother   . Diabetes Paternal Aunt   . Diabetes Paternal Uncle     DRUG ALLERGIES:  Allergies  Allergen Reactions  . Bee Venom Anaphylaxis  . Lasix [Furosemide] Shortness Of Breath, Swelling and Anxiety    REVIEW OF SYSTEMS:   CONSTITUTIONAL: No fever, fatigue or weakness.  EYES: No blurred or  double vision.  EARS, NOSE, AND THROAT: No tinnitus or ear pain.  RESPIRATORY: No cough, shortness of breath, wheezing or hemoptysis.  CARDIOVASCULAR: Has chest pain, no orthopnea, edema.  GASTROINTESTINAL: No nausea, vomiting, diarrhea or abdominal pain.  GENITOURINARY: No dysuria, hematuria.  ENDOCRINE: No polyuria, nocturia,  HEMATOLOGY: No anemia, easy bruising or bleeding SKIN: No rash or lesion. MUSCULOSKELETAL: No joint pain or arthritis.   NEUROLOGIC: No tingling, numbness, weakness.  PSYCHIATRY: No anxiety or depression.   MEDICATIONS AT HOME:  Prior to Admission medications   Medication Sig Start Date  End Date Taking? Authorizing Provider  amLODipine (NORVASC) 10 MG tablet Take 1 tablet (10 mg total) by mouth every evening. 07/10/17   Awilda Bill, NP  aspirin 81 MG tablet Take 1 tablet (81 mg total) by mouth daily. Patient not taking: Reported on 01/13/2018 09/04/17   Gladstone Lighter, MD  carvedilol (COREG) 25 MG tablet Take 1 tablet (25 mg total) by mouth 2 (two) times daily with a meal. Patient not taking: Reported on 01/13/2018 07/10/17   Awilda Bill, NP  cloNIDine (CATAPRES) 0.2 MG tablet Take 1 tablet (0.2 mg total) by mouth 3 (three) times daily. Patient not taking: Reported on 03/29/2018 09/04/17   Gladstone Lighter, MD  hydrALAZINE (APRESOLINE) 50 MG tablet Take 1 tablet (50 mg total) by mouth 2 (two) times daily. Patient not taking: Reported on 03/29/2018 09/04/17   Gladstone Lighter, MD  losartan (COZAAR) 100 MG tablet Take 1 tablet (100 mg total) by mouth every evening. 09/04/17   Gladstone Lighter, MD      PHYSICAL EXAMINATION:   VITAL SIGNS: Blood pressure (!) 163/96, pulse 88, temperature 98 F (36.7 C), temperature source Oral, resp. rate (!) 21, height 6\' 2"  (1.88 m), weight 115.7 kg, SpO2 96 %.  GENERAL:  32 y.o.-year-old well built male patient lying in the bed with no acute distress.  EYES: Pupils equal, round, reactive to light and accommodation. No scleral icterus. Extraocular muscles intact.  HEENT: Head atraumatic, normocephalic. Oropharynx and nasopharynx clear.  NECK:  Supple, no jugular venous distention. No thyroid enlargement, no tenderness.  LUNGS: Normal breath sounds bilaterally, no wheezing, rales,rhonchi or crepitation. No use of accessory muscles of respiration.  CARDIOVASCULAR: S1, S2 normal. No murmurs, rubs, or gallops.  ABDOMEN: Soft, nontender, nondistended. Bowel sounds present. No organomegaly or mass.  EXTREMITIES: No pedal edema, cyanosis, or clubbing.  NEUROLOGIC: Cranial nerves II through XII are intact. Muscle strength 5/5 in all  extremities. Sensation intact. Gait not checked.  PSYCHIATRIC: The patient is alert and oriented x 3.  SKIN: No obvious rash, lesion, or ulcer.   LABORATORY PANEL:   CBC Recent Labs  Lab 10/09/18 1332  WBC 8.6  HGB 11.4*  HCT 34.3*  PLT 169  MCV 92.5  MCH 30.7  MCHC 33.2  RDW 13.5  LYMPHSABS 1.5  MONOABS 0.7  EOSABS 0.2  BASOSABS 0.1   ------------------------------------------------------------------------------------------------------------------  Chemistries  Recent Labs  Lab 10/09/18 1332  NA 141  K 3.6  CL 96*  CO2 29  GLUCOSE 100*  BUN 25*  CREATININE 6.30*  CALCIUM 9.2   ------------------------------------------------------------------------------------------------------------------ estimated creatinine clearance is 22.3 mL/min (A) (by C-G formula based on SCr of 6.3 mg/dL (H)). ------------------------------------------------------------------------------------------------------------------ No results for input(s): TSH, T4TOTAL, T3FREE, THYROIDAB in the last 72 hours.  Invalid input(s): FREET3   Coagulation profile No results for input(s): INR, PROTIME in the last 168 hours. ------------------------------------------------------------------------------------------------------------------- No results for input(s): DDIMER in the last 72 hours. -------------------------------------------------------------------------------------------------------------------  Cardiac Enzymes Recent Labs  Lab 10/09/18 1339  TROPONINI 0.10*   ------------------------------------------------------------------------------------------------------------------ Invalid input(s): POCBNP  ---------------------------------------------------------------------------------------------------------------  Urinalysis    Component Value Date/Time   COLORURINE YELLOW (A) 01/13/2018 2238   APPEARANCEUR CLEAR (A) 01/13/2018 2238   APPEARANCEUR Cloudy 10/20/2012 1158   LABSPEC  1.016 01/13/2018 2238   LABSPEC 1.019 10/20/2012 1158   PHURINE 7.0 01/13/2018 2238   GLUCOSEU 50 (A) 01/13/2018 2238   GLUCOSEU Negative 10/20/2012 1158   HGBUR NEGATIVE 01/13/2018 2238   BILIRUBINUR NEGATIVE 01/13/2018 2238   BILIRUBINUR Negative 10/20/2012 1158   [place] 01/13/2018 2238   PROTEINUR >=300 (A) 01/13/2018 2238   NITRITE NEGATIVE 01/13/2018 2238   LEUKOCYTESUR NEGATIVE 01/13/2018 2238   LEUKOCYTESUR 3+ 10/20/2012 1158     RADIOLOGY: Dg Chest Portable 1 View  Result Date: 10/09/2018 CLINICAL DATA:  Left chest pain following dialysis. Hypertension. EXAM: PORTABLE CHEST 1 VIEW COMPARISON:  05/21/2018. FINDINGS: Mildly progressive enlargement of the cardiac silhouette with interval mild pulmonary vascular congestion and minimally prominent interstitial markings. No pleural fluid seen. Unremarkable bones. IMPRESSION: Mildly progressive cardiomegaly with interval mild pulmonary vascular congestion and minimal interstitial pulmonary edema. Electronically Signed   By: Claudie Revering M.D.   On: 10/09/2018 13:57    EKG: Orders placed or performed during the hospital encounter of 10/09/18  . ED EKG  . ED EKG  . EKG 12-Lead  . EKG 12-Lead    IMPRESSION AND PLAN:  33 year old male patient with history of end-stage renal disease on dialysis, hypertension, anxiety disorder presented to the emergency room for chest discomfort and elevated blood pressure  -Hypertensive urgency Admit patient to telemetry Controlled blood pressure with aspirin, beta-blocker, Cozaar and hydralazine Optimize blood pressure medication  -Elevated troponin could be secondary to hypertensive urgency Cycle troponin and check cardiology consultation Check echocardiogram  -Tobacco abuse Tobacco cessation counseled to the patient for 6 minutes Patient refused nicotine patch  -End-stage renal disease on dialysis Nephrology consultation  -Anxiety disorder Anxiolytics  All the records  are reviewed and case discussed with ED provider. Management plans discussed with the patient, family and they are in agreement.  CODE STATUS:Full code Code Status History    Date Active Date Inactive Code Status Order ID Comments User Context   01/14/2018 0421 01/15/2018 0149 Full Code [id number]  Harrie Foreman, MD ED   10/27/2017 0232 10/27/2017 2315 Full Code [id number]  Saundra Shelling, MD Inpatient   09/03/2017 2141 09/04/2017 2112 Full Code [id number]  Gladstone Lighter, MD Inpatient   07/09/2017 1103 07/10/2017 1914 Full Code [id number]  Epifanio Lesches, MD ED   05/07/2017 1326 05/07/2017 1748 Full Code [id number]  Algernon Huxley, MD Inpatient   04/01/2017 2221 04/03/2017 1548 Full Code [id number]  Harrie Foreman, MD Inpatient   07/09/2016 1950 07/14/2016 1449 Full Code [id number]  Hower, Aaron Mose, MD ED   04/16/2016 2251 04/18/2016 2014 Full Code [id number]  Quintella Baton, MD Inpatient   05/02/2015 1922 05/04/2015 1449 Full Code [id number]  Demetrios Loll, MD Inpatient   05/02/2015 1532 05/02/2015 1922 Full Code [id number]  Dionisio David, MD ED       TOTAL TIME TAKING CARE OF THIS PATIENT: 52 minutes.    Saundra Shelling M.D on 10/09/2018 at 3:43 PM  Between 7am to 6pm - Pager - [phone]  After 6pm go to [email or web address] - password [email or web address] [place] [facility]  Office  [phone]  CC: Primary care physician; Patient, No Pcp Per

## 2018-10-09 NOTE — ED Notes (Signed)
Alan Gross, from dialysis, notified that pt needs to be de-accesed.

## 2018-10-09 NOTE — Progress Notes (Signed)
Patient requesting to leave AMA. RN attempted to educate patient on importance of staying in hospital, patient adament about leaving stating "I have to much stuff going on right now, I have to get out of here". MD Pyreddy made aware. Nurse Supervisor at bedside. AMA form signed and placed in chart. IV and telemetry box removed.

## 2018-10-09 NOTE — Consult Note (Signed)
Pt's dialysis access de-accessed, family at the bedside.

## 2018-10-09 NOTE — ED Provider Notes (Signed)
[facility] Emergency Department Provider Note    First MD Initiated Contact with Patient 10/09/18 1258     (approximate)  I have reviewed the triage vital signs and the nursing notes.   HISTORY  Chief Complaint Chest Pain    HPI Alan Gross is a 32 y.o. male with anxiety, depression as well as CKD on dialysis presents from dialysis where he started developing midsternal chest pain.  Says that he was roughly 1-1/2 hours into his dialysis session.  Also is having some shortness of breath.  It was coming and going "denies any severe pain right now but does still have mild ache.  Denies any fevers.  No productive cough.  Does feel like he is more short of breath than his baseline over the past several weeks.  Denies any unilateral leg swelling.   Past Medical History:  Diagnosis Date  . Anxiety   . Depression   . Dyspnea   . ESRD (end stage renal disease) ([place])   . H/O cardiac catheterization   . History of febrile seizure   . Hypertension   . Irregular heart beats   . Kidney failure    M,W,F [facility] in [place]; 15 %   . 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 Hypertension Other   . Cancer Father        liver  . Hypertension Brother   . Diabetes Paternal Aunt   . Diabetes Paternal Uncle    Past Surgical History:  Procedure Laterality Date  . A/V FISTULAGRAM N/A 05/07/2017   Procedure: A/V Fistulagram;  Surgeon: Algernon Huxley, MD;  Location: [place] [facility];  Service: Cardiovascular;  Laterality: N/A;  . AV FISTULA PLACEMENT Left 09/10/2016   Procedure: LEFT ARTERIOVENOUS (AV) FISTULA CREATION;  Surgeon: Conrad Cordele, MD;  Location: [place];  Service: Vascular;  Laterality: Left;  . CARDIAC CATHETERIZATION Left 05/02/2015   Procedure: Left Heart Cath and Coronary Angiography;  Surgeon: Dionisio David, MD;  Location: [place] [facility];  Service: Cardiovascular;  Laterality: Left;  .  CORONARY ANGIOPLASTY    . PERIPHERAL VASCULAR CATHETERIZATION N/A 07/11/2016   Procedure: Dialysis/Perma Catheter Insertion;  Surgeon: Algernon Huxley, MD;  Location: [place] [facility];  Service: Cardiovascular;  Laterality: N/A;  . PERIPHERAL VASCULAR THROMBECTOMY Left 05/07/2017   Procedure: Peripheral Vascular Thrombectomy;  Surgeon: Algernon Huxley, MD;  Location: [place] [facility];  Service: Cardiovascular;  Laterality: Left;   Patient Active Problem List   Diagnosis Date Noted  . Hypertensive emergency 01/14/2018  . Fluid overload 10/27/2017  . Chest pain 09/03/2017  . Elevated troponin 08/18/2017  . 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 Hypertensive urgency 04/01/2017  . ESRD (end stage renal disease) on dialysis ([place]) 07/19/2016  . Anxiety 07/19/2016  . Heart failure, diastolic, due to HTN ([place]) 07/19/2016  . Costochondritis 07/19/2016  . HTN (hypertension) 07/09/2016      Prior to Admission medications   Medication Sig Start Date End Date Taking? Authorizing Provider  amLODipine (NORVASC) 10 MG tablet Take 1 tablet (10 mg total) by mouth every evening. 07/10/17   Awilda Bill, NP  aspirin 81 MG tablet Take 1 tablet (81 mg total) by mouth daily. Patient not taking: Reported on 01/13/2018 09/04/17   Gladstone Lighter, MD  carvedilol (COREG) 25 MG tablet Take 1 tablet (25 mg  total) by mouth 2 (two) times daily with a meal. Patient not taking: Reported on 01/13/2018 07/10/17   Awilda Bill, NP  cloNIDine (CATAPRES) 0.2 MG tablet Take 1 tablet (0.2 mg total) by mouth 3 (three) times daily. Patient not taking: Reported on 03/29/2018 09/04/17   Gladstone Lighter, MD  hydrALAZINE (APRESOLINE) 50 MG tablet Take 1 tablet (50 mg total) by mouth 2 (two) times daily. Patient not taking: Reported on 03/29/2018 09/04/17   Gladstone Lighter, MD  losartan (COZAAR) 100 MG tablet Take 1 tablet (100 mg total) by mouth every evening. 09/04/17    Gladstone Lighter, MD    Allergies Bee venom and Lasix [furosemide]    Social History Social History   Tobacco Use  . Smoking status: Current Every Day Smoker    Packs/day: 0.25    Years: 10.00    Pack years: 2.50    Types: Cigarettes    Last attempt to quit: 06/22/2016    Years since quitting: 2.2  . Smokeless tobacco: Never Used  Substance Use Topics  . Alcohol use: No  . Drug use: Yes    Types: Marijuana    Review of Systems Patient denies headaches, rhinorrhea, blurry vision, numbness, shortness of breath, chest pain, edema, cough, abdominal pain, nausea, vomiting, diarrhea, dysuria, fevers, rashes or hallucinations unless otherwise stated above in HPI. ____________________________________________   PHYSICAL EXAM:  VITAL SIGNS: Vitals:   10/09/18 1456 10/09/18 1500  BP: (!) 184/87 (!) 163/96  Pulse: 87 88  Resp: (!) 33 (!) 21  Temp:    SpO2: 96% 96%    Constitutional: Alert and oriented.  Eyes: Conjunctivae are normal.  Head: Atraumatic. Nose: No congestion/rhinnorhea. Mouth/Throat: Mucous membranes are moist.   Neck: No stridor. Painless ROM.  Cardiovascular: Normal rate, regular rhythm. Grossly normal heart sounds.  Good peripheral circulation.  Left AV fistula wth palpable frill Respiratory: Normal respiratory effort.  No retractions. Lungs CTAB. Gastrointestinal: Soft and nontender. No distention. No abdominal bruits. No CVA tenderness. Genitourinary: deferred Musculoskeletal: No lower extremity tenderness nor edema.  No joint effusions. Neurologic:  Normal speech and language. No gross focal neurologic deficits are appreciated. No facial droop Skin:  Skin is warm, dry and intact. No rash noted. Psychiatric: Mood and affect are normal. Speech and behavior are normal.  ____________________________________________   LABS (all labs ordered are listed, but only abnormal results are displayed)  Results for orders placed or performed during the hospital  encounter of 10/09/18 (from the past 24 hour(s))  CBC with Differential/Platelet     Status: Abnormal   Collection Time: 10/09/18  1:32 PM  Result Value Ref Range   WBC 8.6 4.0 - 10.5 K/uL   RBC 3.71 (L) 4.22 - 5.81 MIL/uL   Hemoglobin 11.4 (L) 13.0 - 17.0 g/dL   HCT 34.3 (L) 39.0 - 52.0 %   MCV 92.5 80.0 - 100.0 fL   MCH 30.7 26.0 - 34.0 pg   MCHC 33.2 30.0 - 36.0 g/dL   RDW 13.5 11.5 - 15.5 %   Platelets 169 150 - 400 K/uL   nRBC 0.0 0.0 - 0.2 %   Neutrophils Relative % 71 %   Neutro Abs 6.2 1.7 - 7.7 K/uL   Lymphocytes Relative 17 %   Lymphs Abs 1.5 0.7 - 4.0 K/uL   Monocytes Relative 8 %   Monocytes Absolute 0.7 0.1 - 1.0 K/uL   Eosinophils Relative 2 %   Eosinophils Absolute 0.2 0.0 - 0.5 K/uL   Basophils Relative 1 %  Basophils Absolute 0.1 0.0 - 0.1 K/uL   Immature Granulocytes 1 %   Abs Immature Granulocytes 0.04 0.00 - 0.07 K/uL  Basic metabolic panel     Status: Abnormal   Collection Time: 10/09/18  1:32 PM  Result Value Ref Range   Sodium 141 135 - 145 mmol/L   Potassium 3.6 3.5 - 5.1 mmol/L   Chloride 96 (L) 98 - 111 mmol/L   CO2 29 22 - 32 mmol/L   Glucose, Bld 100 (H) 70 - 99 mg/dL   BUN 25 (H) 6 - 20 mg/dL   Creatinine, Ser 6.30 (H) 0.61 - 1.24 mg/dL   Calcium 9.2 8.9 - 10.3 mg/dL   GFR calc non Af Amer 10 (L) >60 mL/min   GFR calc Af Amer 12 (L) >60 mL/min   Anion gap 16 (H) 5 - 15  Troponin I - ONCE - STAT     Status: Abnormal   Collection Time: 10/09/18  1:39 PM  Result Value Ref Range   Troponin I 0.10 (HH) <0.03 ng/mL   ____________________________________________  EKG My review and personal interpretation at Time: 12:38   Indication: chest pain  Rate: 90  Rhythm: sinus Axis: normal Other: normal intervals, lvh criteria, no stemi criteria,  ekg grossly unchanged morphology compared to previous tracing 03/30/18 ____________________________________________  RADIOLOGY  I personally reviewed all radiographic images ordered to evaluate for the  above acute complaints and reviewed radiology reports and findings.  These findings were personally discussed with the patient.  Please see medical record for radiology report.  ____________________________________________   PROCEDURES  Procedure(s) performed:  Procedures    Critical Care performed: no ____________________________________________   INITIAL IMPRESSION / ASSESSMENT AND PLAN / ED COURSE  Pertinent labs & imaging results that were available during my care of the patient were reviewed by me and considered in my medical decision making (see chart for details).   DDX: ACS, pericarditis, esophagitis, boerhaaves, pe, dissection, pna, bronchitis, costochondritis   BRAXTIN BAMBA is a 32 y.o. who presents to the ED with chest pain as described above.  Patient with markedly elevated diastolic pressure.  Do suspect some component of hypertensive urgency.  Will provide his home meds as well as IV antihypertensive medication.  EKG shows some changes consistent with LVH and benign early re-pole consistent with previous.  Doubt ACS but will send troponin.  The patient will be placed on continuous pulse oximetry and telemetry for monitoring.  Laboratory evaluation will be sent to evaluate for the above complaints.     Clinical Course as of Oct 09 1521  Fri Oct 09, 2018  1516 I consulted with Dr. Juleen China of nephrology who agrees with admission for management of hypertension as well as urgent dialysis as he does have some mild edema.  Patient's pain is resolved.  Does not seem clinically consistent with ACS.  More likely hypertensive urgency and assoc demand ischemia.     [PR]    Clinical Course User Index [PR] Merlyn Lot, MD     As part of my medical decision making, I reviewed the following data within the [place] notes reviewed and incorporated, Labs reviewed, notes from prior ED  visits.   ____________________________________________   FINAL CLINICAL IMPRESSION(S) / ED DIAGNOSES  Final diagnoses:  Hypertensive urgency      NEW MEDICATIONS STARTED DURING THIS VISIT:  New Prescriptions   No medications on file     Note:  This document was prepared using Dragon voice  recognition software and may include unintentional dictation errors.    Merlyn Lot, MD 10/09/18 [phone]

## 2018-10-10 LAB — HIV ANTIBODY (ROUTINE TESTING W REFLEX): HIV Screen 4th Generation wRfx: NONREACTIVE

## 2018-10-11 DIAGNOSIS — N186 End stage renal disease: Secondary | ICD-10-CM | POA: Diagnosis not present

## 2018-10-11 DIAGNOSIS — E118 Type 2 diabetes mellitus with unspecified complications: Secondary | ICD-10-CM | POA: Diagnosis not present

## 2018-10-11 DIAGNOSIS — D509 Iron deficiency anemia, unspecified: Secondary | ICD-10-CM | POA: Diagnosis not present

## 2018-10-11 DIAGNOSIS — N2581 Secondary hyperparathyroidism of renal origin: Secondary | ICD-10-CM | POA: Diagnosis not present

## 2018-10-12 DIAGNOSIS — E118 Type 2 diabetes mellitus with unspecified complications: Secondary | ICD-10-CM | POA: Diagnosis not present

## 2018-10-12 DIAGNOSIS — N186 End stage renal disease: Secondary | ICD-10-CM | POA: Diagnosis not present

## 2018-10-12 DIAGNOSIS — D509 Iron deficiency anemia, unspecified: Secondary | ICD-10-CM | POA: Diagnosis not present

## 2018-10-12 DIAGNOSIS — N2581 Secondary hyperparathyroidism of renal origin: Secondary | ICD-10-CM | POA: Diagnosis not present

## 2018-10-12 NOTE — Discharge Summary (Signed)
Discharge summary Date of admission 10/09/2018 Date of signing [place] 10/09/2018  Brief history of present illness and course of hospital stay 32 year old male patient with history of end-stage renal disease on dialysis, hypertension, anxiety disorder was admitted to telemetry for hypertensive urgency, elevated troponin.  Patient is dialysis.  He had elevated troponin secondary to elevated blood pressure.  Patient was started on aspirin, beta-blocker, Cozaar and hydralazine.  Plan was to do an echocardiogram.  Patient signed [place] and left the hospital.

## 2018-10-16 DIAGNOSIS — N2581 Secondary hyperparathyroidism of renal origin: Secondary | ICD-10-CM | POA: Diagnosis not present

## 2018-10-16 DIAGNOSIS — D509 Iron deficiency anemia, unspecified: Secondary | ICD-10-CM | POA: Diagnosis not present

## 2018-10-16 DIAGNOSIS — E118 Type 2 diabetes mellitus with unspecified complications: Secondary | ICD-10-CM | POA: Diagnosis not present

## 2018-10-16 DIAGNOSIS — N186 End stage renal disease: Secondary | ICD-10-CM | POA: Diagnosis not present

## 2018-10-18 DIAGNOSIS — N186 End stage renal disease: Secondary | ICD-10-CM | POA: Diagnosis not present

## 2018-10-18 DIAGNOSIS — I129 Hypertensive chronic kidney disease with stage 1 through stage 4 chronic kidney disease, or unspecified chronic kidney disease: Secondary | ICD-10-CM | POA: Diagnosis not present

## 2018-10-18 DIAGNOSIS — Z992 Dependence on renal dialysis: Secondary | ICD-10-CM | POA: Diagnosis not present

## 2018-10-19 DIAGNOSIS — I771 Stricture of artery: Secondary | ICD-10-CM | POA: Diagnosis not present

## 2018-10-19 DIAGNOSIS — T82858A Stenosis of vascular prosthetic devices, implants and grafts, initial encounter: Secondary | ICD-10-CM | POA: Diagnosis not present

## 2018-10-19 DIAGNOSIS — Z992 Dependence on renal dialysis: Secondary | ICD-10-CM | POA: Diagnosis not present

## 2018-10-19 DIAGNOSIS — N186 End stage renal disease: Secondary | ICD-10-CM | POA: Diagnosis not present

## 2018-10-20 DIAGNOSIS — D649 Anemia, unspecified: Secondary | ICD-10-CM | POA: Diagnosis not present

## 2018-10-20 DIAGNOSIS — E118 Type 2 diabetes mellitus with unspecified complications: Secondary | ICD-10-CM | POA: Diagnosis not present

## 2018-10-20 DIAGNOSIS — N186 End stage renal disease: Secondary | ICD-10-CM | POA: Diagnosis not present

## 2018-10-20 DIAGNOSIS — N2581 Secondary hyperparathyroidism of renal origin: Secondary | ICD-10-CM | POA: Diagnosis not present

## 2018-10-22 IMAGING — CR DG CHEST 2V
2 series · 2 of 2 positions shown · non-contrast
Comparison: 10/26/2017

CLINICAL DATA: Shortness of breath, chest pain

EXAM:
CHEST  2 VIEW

[chest pa]
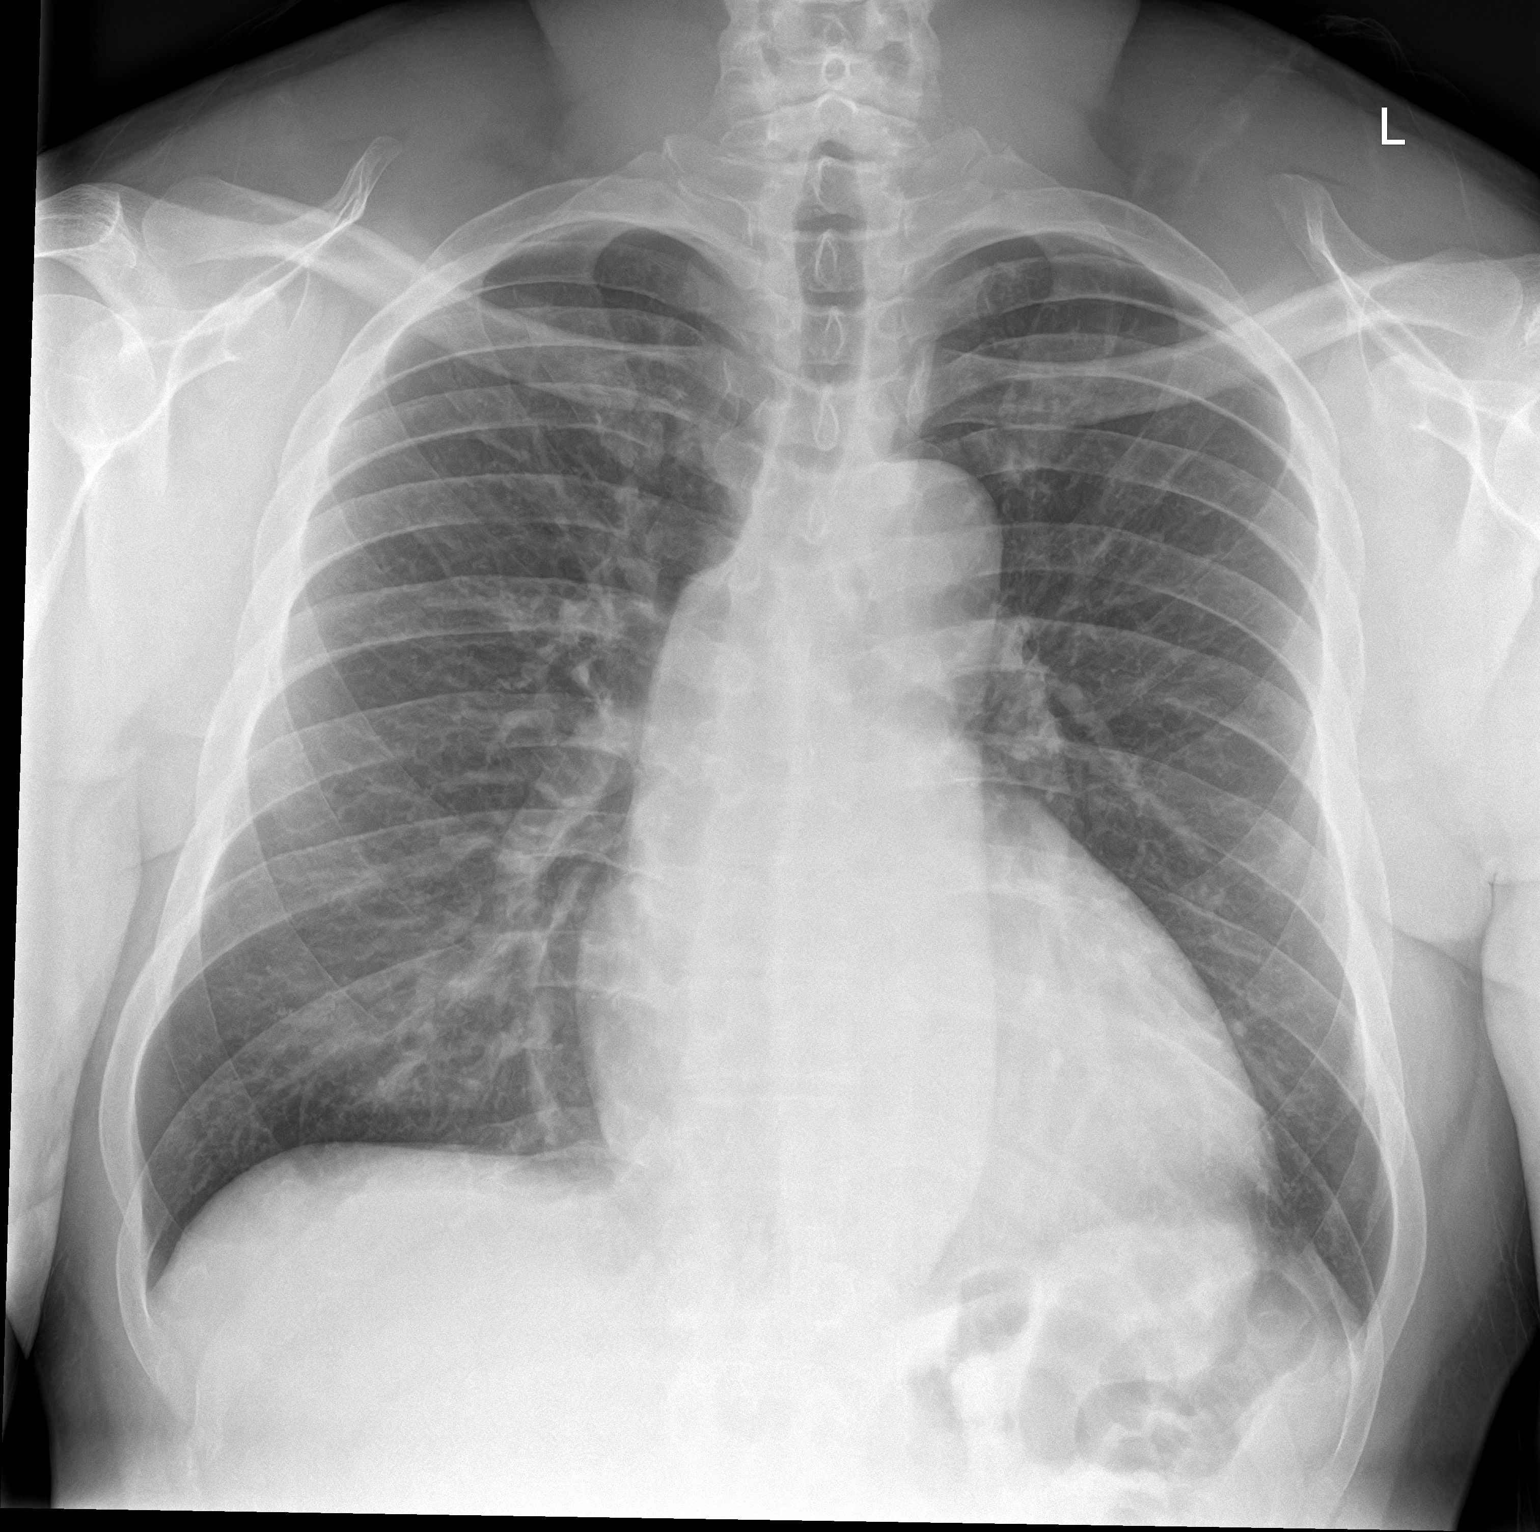

[chest lat]
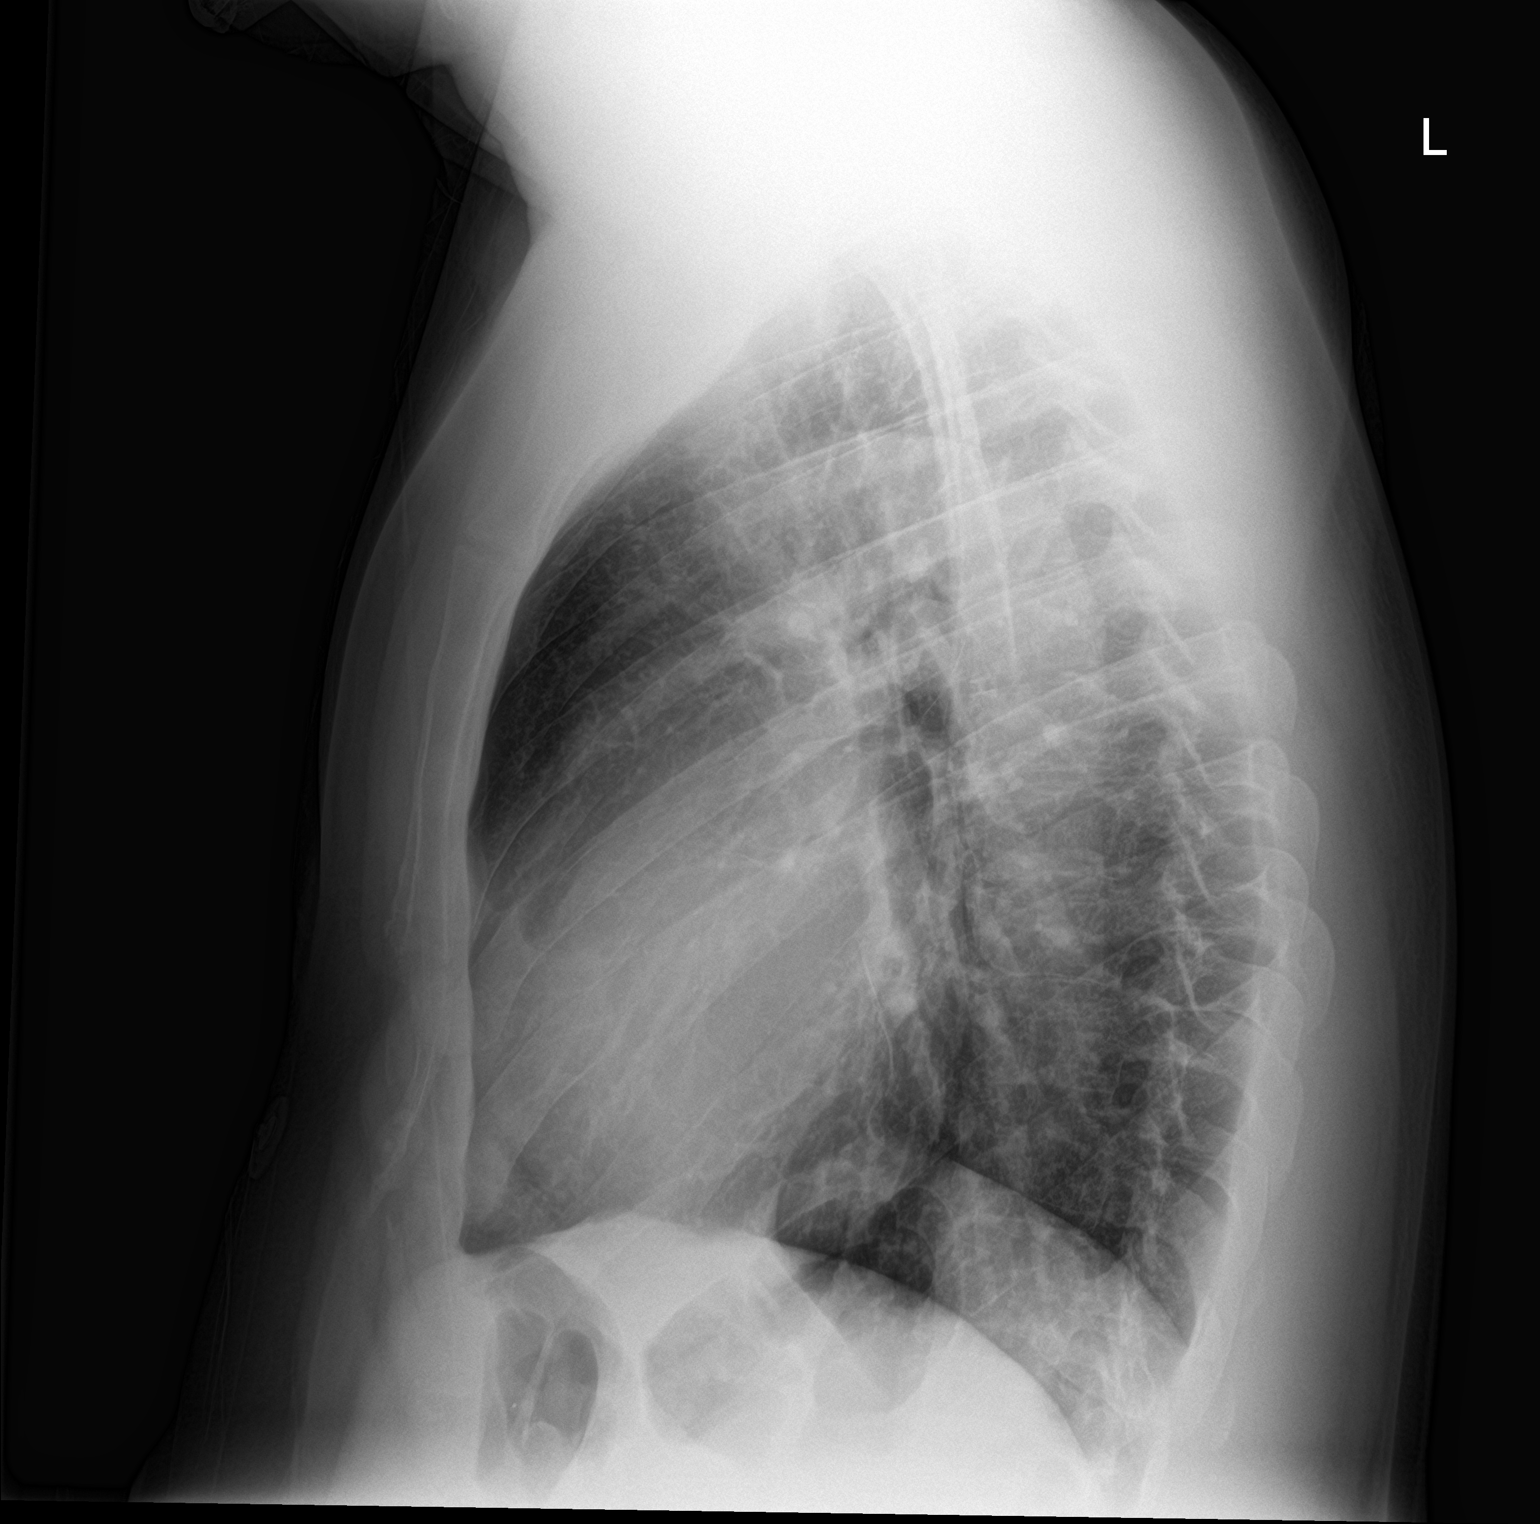

[2 of 2 positions shown; findings below may reference images not displayed]

FINDINGS: Cardiomegaly. No confluent opacities, effusions or edema. No acute
bony abnormality.
IMPRESSION: Cardiomegaly.  No active disease.

## 2018-10-24 DIAGNOSIS — E118 Type 2 diabetes mellitus with unspecified complications: Secondary | ICD-10-CM | POA: Diagnosis not present

## 2018-10-24 DIAGNOSIS — N186 End stage renal disease: Secondary | ICD-10-CM | POA: Diagnosis not present

## 2018-10-24 DIAGNOSIS — D649 Anemia, unspecified: Secondary | ICD-10-CM | POA: Diagnosis not present

## 2018-10-24 DIAGNOSIS — N2581 Secondary hyperparathyroidism of renal origin: Secondary | ICD-10-CM | POA: Diagnosis not present

## 2018-10-28 DIAGNOSIS — E118 Type 2 diabetes mellitus with unspecified complications: Secondary | ICD-10-CM | POA: Diagnosis not present

## 2018-10-28 DIAGNOSIS — N186 End stage renal disease: Secondary | ICD-10-CM | POA: Diagnosis not present

## 2018-10-28 DIAGNOSIS — N2581 Secondary hyperparathyroidism of renal origin: Secondary | ICD-10-CM | POA: Diagnosis not present

## 2018-10-28 DIAGNOSIS — D649 Anemia, unspecified: Secondary | ICD-10-CM | POA: Diagnosis not present

## 2018-10-30 DIAGNOSIS — N186 End stage renal disease: Secondary | ICD-10-CM | POA: Diagnosis not present

## 2018-10-30 DIAGNOSIS — E118 Type 2 diabetes mellitus with unspecified complications: Secondary | ICD-10-CM | POA: Diagnosis not present

## 2018-10-30 DIAGNOSIS — D649 Anemia, unspecified: Secondary | ICD-10-CM | POA: Diagnosis not present

## 2018-10-30 DIAGNOSIS — N2581 Secondary hyperparathyroidism of renal origin: Secondary | ICD-10-CM | POA: Diagnosis not present

## 2018-11-04 DIAGNOSIS — N186 End stage renal disease: Secondary | ICD-10-CM | POA: Diagnosis not present

## 2018-11-04 DIAGNOSIS — E118 Type 2 diabetes mellitus with unspecified complications: Secondary | ICD-10-CM | POA: Diagnosis not present

## 2018-11-04 DIAGNOSIS — N2581 Secondary hyperparathyroidism of renal origin: Secondary | ICD-10-CM | POA: Diagnosis not present

## 2018-11-04 DIAGNOSIS — D649 Anemia, unspecified: Secondary | ICD-10-CM | POA: Diagnosis not present

## 2018-11-06 DIAGNOSIS — D649 Anemia, unspecified: Secondary | ICD-10-CM | POA: Diagnosis not present

## 2018-11-06 DIAGNOSIS — E118 Type 2 diabetes mellitus with unspecified complications: Secondary | ICD-10-CM | POA: Diagnosis not present

## 2018-11-06 DIAGNOSIS — N2581 Secondary hyperparathyroidism of renal origin: Secondary | ICD-10-CM | POA: Diagnosis not present

## 2018-11-06 DIAGNOSIS — N186 End stage renal disease: Secondary | ICD-10-CM | POA: Diagnosis not present

## 2018-11-08 DIAGNOSIS — D649 Anemia, unspecified: Secondary | ICD-10-CM | POA: Diagnosis not present

## 2018-11-08 DIAGNOSIS — N186 End stage renal disease: Secondary | ICD-10-CM | POA: Diagnosis not present

## 2018-11-08 DIAGNOSIS — E118 Type 2 diabetes mellitus with unspecified complications: Secondary | ICD-10-CM | POA: Diagnosis not present

## 2018-11-08 DIAGNOSIS — N2581 Secondary hyperparathyroidism of renal origin: Secondary | ICD-10-CM | POA: Diagnosis not present

## 2018-11-13 DIAGNOSIS — N186 End stage renal disease: Secondary | ICD-10-CM | POA: Diagnosis not present

## 2018-11-13 DIAGNOSIS — D649 Anemia, unspecified: Secondary | ICD-10-CM | POA: Diagnosis not present

## 2018-11-13 DIAGNOSIS — E118 Type 2 diabetes mellitus with unspecified complications: Secondary | ICD-10-CM | POA: Diagnosis not present

## 2018-11-13 DIAGNOSIS — N2581 Secondary hyperparathyroidism of renal origin: Secondary | ICD-10-CM | POA: Diagnosis not present

## 2018-11-17 ENCOUNTER — Emergency Department: Payer: Medicare Other

## 2018-11-17 ENCOUNTER — Emergency Department
Admission: EM | Admit: 2018-11-17 | Discharge: 2018-11-17 | Disposition: A | Discharge status: Home / Self Care | Payer: Medicare Other | Attending: Emergency Medicine | Admitting: Emergency Medicine

## 2018-11-17 ENCOUNTER — Other Ambulatory Visit: Payer: Self-pay

## 2018-11-17 DIAGNOSIS — R079 Chest pain, unspecified: Secondary | ICD-10-CM | POA: Diagnosis not present

## 2018-11-17 DIAGNOSIS — F17210 Nicotine dependence, cigarettes, uncomplicated: Secondary | ICD-10-CM | POA: Insufficient documentation

## 2018-11-17 DIAGNOSIS — Z8673 Personal history of transient ischemic attack (TIA), and cerebral infarction without residual deficits: Secondary | ICD-10-CM | POA: Diagnosis not present

## 2018-11-17 DIAGNOSIS — Z992 Dependence on renal dialysis: Secondary | ICD-10-CM | POA: Insufficient documentation

## 2018-11-17 DIAGNOSIS — Z7982 Long term (current) use of aspirin: Secondary | ICD-10-CM | POA: Insufficient documentation

## 2018-11-17 DIAGNOSIS — N186 End stage renal disease: Secondary | ICD-10-CM | POA: Diagnosis not present

## 2018-11-17 DIAGNOSIS — Z79899 Other long term (current) drug therapy: Secondary | ICD-10-CM | POA: Insufficient documentation

## 2018-11-17 DIAGNOSIS — I120 Hypertensive chronic kidney disease with stage 5 chronic kidney disease or end stage renal disease: Secondary | ICD-10-CM | POA: Insufficient documentation

## 2018-11-17 DIAGNOSIS — I10 Essential (primary) hypertension: Secondary | ICD-10-CM

## 2018-11-17 DIAGNOSIS — N2581 Secondary hyperparathyroidism of renal origin: Secondary | ICD-10-CM | POA: Diagnosis not present

## 2018-11-17 DIAGNOSIS — R0689 Other abnormalities of breathing: Secondary | ICD-10-CM | POA: Diagnosis not present

## 2018-11-17 DIAGNOSIS — R0789 Other chest pain: Secondary | ICD-10-CM | POA: Diagnosis not present

## 2018-11-17 DIAGNOSIS — D649 Anemia, unspecified: Secondary | ICD-10-CM | POA: Diagnosis not present

## 2018-11-17 DIAGNOSIS — I471 Supraventricular tachycardia: Secondary | ICD-10-CM | POA: Diagnosis not present

## 2018-11-17 DIAGNOSIS — R0602 Shortness of breath: Secondary | ICD-10-CM | POA: Diagnosis not present

## 2018-11-17 DIAGNOSIS — E118 Type 2 diabetes mellitus with unspecified complications: Secondary | ICD-10-CM | POA: Diagnosis not present

## 2018-11-17 LAB — CBC WITH DIFFERENTIAL/PLATELET
Abs Immature Granulocytes: 0.04 10*3/uL (ref 0.00–0.07)
BASOS PCT: 1 %
Basophils Absolute: 0.1 10*3/uL (ref 0.0–0.1)
EOS ABS: 0.2 10*3/uL (ref 0.0–0.5)
EOS PCT: 2 %
HEMATOCRIT: 26.7 % — AB (ref 39.0–52.0)
Hemoglobin: 8.9 g/dL — ABNORMAL LOW (ref 13.0–17.0)
IMMATURE GRANULOCYTES: 0 %
LYMPHS ABS: 0.9 10*3/uL (ref 0.7–4.0)
Lymphocytes Relative: 8 %
MCH: 30.3 pg (ref 26.0–34.0)
MCHC: 33.3 g/dL (ref 30.0–36.0)
MCV: 90.8 fL (ref 80.0–100.0)
MONOS PCT: 6 %
Monocytes Absolute: 0.7 10*3/uL (ref 0.1–1.0)
NEUTROS PCT: 83 %
Neutro Abs: 8.5 10*3/uL — ABNORMAL HIGH (ref 1.7–7.7)
PLATELETS: 171 10*3/uL (ref 150–400)
RBC: 2.94 MIL/uL — ABNORMAL LOW (ref 4.22–5.81)
RDW: 14.1 % (ref 11.5–15.5)
WBC: 10.3 10*3/uL (ref 4.0–10.5)
nRBC: 0 % (ref 0.0–0.2)

## 2018-11-17 LAB — BASIC METABOLIC PANEL
ANION GAP: 14 (ref 5–15)
BUN: 55 mg/dL — AB (ref 6–20)
CALCIUM: 8.7 mg/dL — AB (ref 8.9–10.3)
CO2: 25 mmol/L (ref 22–32)
CREATININE: 10.85 mg/dL — AB (ref 0.61–1.24)
Chloride: 100 mmol/L (ref 98–111)
GFR calc Af Amer: 6 mL/min — ABNORMAL LOW (ref >60)
GFR, EST NON AFRICAN AMERICAN: 5 mL/min — AB (ref >60)
GLUCOSE: 111 mg/dL — AB (ref 70–99)
Potassium: 2.9 mmol/L — ABNORMAL LOW (ref 3.5–5.1)
Sodium: 139 mmol/L (ref 135–145)

## 2018-11-17 LAB — TROPONIN I
Troponin I: 0.18 ng/mL (ref <0.03)
Troponin I: 0.19 ng/mL (ref <0.03)

## 2018-11-17 MED ORDER — SUCROFERRIC OXYHYDROXIDE 500 MG PO CHEW
500.0000 mg | CHEWABLE_TABLET | Freq: Three times a day (TID) | ORAL | Status: DC
  Administered 2018-11-17: 500 mg via ORAL
  Filled 2018-11-17: qty 1

## 2018-11-17 MED ORDER — CLONIDINE HCL 0.1 MG PO TABS
0.3000 mg | ORAL_TABLET | Freq: Once | ORAL | Status: DC
  Filled 2018-11-17: qty 3

## 2018-11-17 MED ORDER — LORAZEPAM 1 MG PO TABS: 1.0000 mg | ORAL_TABLET | Freq: Two times a day (BID) | ORAL | 0 refills | Status: DC | PRN

## 2018-11-17 MED ORDER — LABETALOL HCL 5 MG/ML IV SOLN
10.0000 mg | Freq: Once | INTRAVENOUS | Status: AC
  Administered 2018-11-17: 10 mg via INTRAVENOUS
  Filled 2018-11-17: qty 4

## 2018-11-17 MED ORDER — AMLODIPINE BESYLATE 5 MG PO TABS
10.0000 mg | ORAL_TABLET | Freq: Once | ORAL | Status: AC
  Administered 2018-11-17: 10 mg via ORAL
  Filled 2018-11-17: qty 2

## 2018-11-17 MED ORDER — CLONIDINE HCL 0.1 MG PO TABS: 0.2000 mg | ORAL_TABLET | Freq: Once | ORAL | Status: DC

## 2018-11-17 MED ORDER — CLONIDINE HCL 0.1 MG PO TABS
0.2000 mg | ORAL_TABLET | Freq: Once | ORAL | Status: AC
  Administered 2018-11-17: 0.2 mg via ORAL
  Filled 2018-11-17: qty 2

## 2018-11-17 MED ORDER — HYDROCODONE-ACETAMINOPHEN 5-325 MG PO TABS
1.0000 | ORAL_TABLET | Freq: Once | ORAL | Status: AC
  Administered 2018-11-17: 1 via ORAL

## 2018-11-17 MED ORDER — MINOXIDIL 2.5 MG PO TABS
10.0000 mg | ORAL_TABLET | Freq: Every day | ORAL | Status: DC
  Administered 2018-11-17: 10 mg via ORAL
  Filled 2018-11-17: qty 4

## 2018-11-17 MED ORDER — HYDRALAZINE HCL 50 MG PO TABS
50.0000 mg | ORAL_TABLET | Freq: Once | ORAL | Status: DC
  Filled 2018-11-17: qty 1

## 2018-11-17 NOTE — ED Notes (Signed)
Pt refusing to stay for further care and evaluation. Pt ambulatory with steady gait at this time. Pt signed AMA form and placed on file.

## 2018-11-17 NOTE — ED Notes (Signed)
Called pharmacy and they will send up medication 

## 2018-11-17 NOTE — ED Provider Notes (Addendum)
Patient's blood pressure appears to be improving.  Troponin is not significantly changed.  Patient does not want to stay and finish dialysis.   The discharge process his heart rate was noted to be elevated. Repeat EKG reveals sinus tachycardia with a rate of 150 bpm, LVH with repolarization abnormality, septal infarct, long QT  I have advised he must stay and receive treatment for his heart rate elevated blood pressure and likely finished dialysis.  He will be leaving AGAINST MEDICAL ADVICE.   Earleen Newport, MD 11/17/18 [place]    Earleen Newport, MD 11/17/18 [phone]

## 2018-11-17 NOTE — ED Notes (Signed)
Date and time results received: 11/17/18 1519 (use smartphrase ".now" to insert current time)  Test: troponin Critical Value: 0.18  Name of Provider Notified: Jimmye Norman  Orders Received? Or Actions Taken?: Orders Received - See Orders for details

## 2018-11-17 NOTE — ED Provider Notes (Signed)
[facility] Emergency Department Provider Note    First MD Initiated Contact with Patient 11/17/18 1410     (approximate)  I have reviewed the triage vital signs and the nursing notes.   HISTORY  Chief Complaint Chest Pain    HPI Alan Gross is a 32 y.o. male presents to the ER from dialysis for evaluation of chest pain.  States he has had similar episodes in the past.  Was received about 45 minutes of dialysis.  He is about volume neutral as his baseline weight is 112 and it was similar to day though he did miss dialysis through the holiday due to scheduling errors.  Denies any fevers.  No lower extremity swelling.  Describes the pain is burning in nature on the left side.    Past Medical History:  Diagnosis Date  . Anxiety   . Depression   . Dyspnea   . ESRD (end stage renal disease) ([place])   . H/O cardiac catheterization   . History of febrile seizure   . Hypertension   . Irregular heart beats   . Kidney failure    M,W,F [facility] in [place]; 15 %   . 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 Hypertension Other   . Cancer Father        liver  . Hypertension Brother   . Diabetes Paternal Aunt   . Diabetes Paternal Uncle    Past Surgical History:  Procedure Laterality Date  . A/V FISTULAGRAM N/A 05/07/2017   Procedure: A/V Fistulagram;  Surgeon: Algernon Huxley, MD;  Location: [place] [facility];  Service: Cardiovascular;  Laterality: N/A;  . AV FISTULA PLACEMENT Left 09/10/2016   Procedure: LEFT ARTERIOVENOUS (AV) FISTULA CREATION;  Surgeon: Conrad Springhill, MD;  Location: [place];  Service: Vascular;  Laterality: Left;  . CARDIAC CATHETERIZATION Left 05/02/2015   Procedure: Left Heart Cath and Coronary Angiography;  Surgeon: Dionisio David, MD;  Location: [place] [facility];  Service: Cardiovascular;  Laterality: Left;  . CORONARY ANGIOPLASTY    . PERIPHERAL VASCULAR CATHETERIZATION N/A  07/11/2016   Procedure: Dialysis/Perma Catheter Insertion;  Surgeon: Algernon Huxley, MD;  Location: [place] [facility];  Service: Cardiovascular;  Laterality: N/A;  . PERIPHERAL VASCULAR THROMBECTOMY Left 05/07/2017   Procedure: Peripheral Vascular Thrombectomy;  Surgeon: Algernon Huxley, MD;  Location: [place] [facility];  Service: Cardiovascular;  Laterality: Left;   Patient Active Problem List   Diagnosis Date Noted  . Hypertensive emergency 01/14/2018  . Fluid overload 10/27/2017  . Chest pain 09/03/2017  . Elevated troponin 08/18/2017  . 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 Hypertensive urgency 04/01/2017  . ESRD (end stage renal disease) on dialysis ([place]) 07/19/2016  . Anxiety 07/19/2016  . Heart failure, diastolic, due to HTN ([place]) 07/19/2016  . Costochondritis 07/19/2016  . HTN (hypertension) 07/09/2016      Prior to Admission medications   Medication Sig Start Date End Date Taking? Authorizing Provider  amLODipine (NORVASC) 10 MG tablet Take 1 tablet (10 mg total) by mouth every evening. 07/10/17  Yes Awilda Bill, NP  losartan (COZAAR) 100 MG tablet Take 1 tablet (100 mg total) by mouth every evening. 09/04/17  Yes Gladstone Lighter, MD  minoxidil (LONITEN) 10 MG tablet Take 10 mg by mouth 2 (two) times daily with a meal.    Yes [provider]  sucroferric oxyhydroxide (VELPHORO) 500 MG chewable tablet Chew 500 mg by mouth 4 (four) times daily.   Yes [provider]  aspirin 81 MG tablet Take 1 tablet (81 mg total) by mouth daily. Patient not taking: Reported on 01/13/2018 09/04/17   Gladstone Lighter, MD  carvedilol (COREG) 25 MG tablet Take 1 tablet (25 mg total) by mouth 2 (two) times daily with a meal. Patient not taking: Reported on 01/13/2018 07/10/17   Awilda Bill, NP  cloNIDine (CATAPRES) 0.2 MG tablet Take 1 tablet (0.2 mg total) by mouth 3 (three) times daily. Patient not taking: Reported on  03/29/2018 09/04/17   Gladstone Lighter, MD  hydrALAZINE (APRESOLINE) 50 MG tablet Take 1 tablet (50 mg total) by mouth 2 (two) times daily. Patient not taking: Reported on 03/29/2018 09/04/17   Gladstone Lighter, MD    Allergies Bee venom and Lasix [furosemide]    Social History Social History   Tobacco Use  . Smoking status: Current Every Day Smoker    Packs/day: 0.25    Years: 10.00    Pack years: 2.50    Types: Cigarettes    Last attempt to quit: 06/22/2016    Years since quitting: 2.4  . Smokeless tobacco: Never Used  Substance Use Topics  . Alcohol use: No  . Drug use: Yes    Types: Marijuana    Review of Systems Patient denies headaches, rhinorrhea, blurry vision, numbness, shortness of breath, chest pain, edema, cough, abdominal pain, nausea, vomiting, diarrhea, dysuria, fevers, rashes or hallucinations unless otherwise stated above in HPI. ____________________________________________   PHYSICAL EXAM:  VITAL SIGNS: Vitals:   11/17/18 1425  BP: (!) 210/126  Pulse: 99  Resp: 19  Temp: 98.2 F (36.8 C)  SpO2: 99%    Constitutional: Alert and oriented.  Eyes: Conjunctivae are normal.  Head: Atraumatic. Nose: No congestion/rhinnorhea. Mouth/Throat: Mucous membranes are moist.   Neck: No stridor. Painless ROM.  Cardiovascular: Normal rate, regular rhythm. Grossly normal heart sounds.  Good peripheral circulation.  Dialysis needles still in LUE Respiratory: Normal respiratory effort.  No retractions. Lungs CTAB. Gastrointestinal: Soft and nontender. No distention. No abdominal bruits. No CVA tenderness. Genitourinary:  Musculoskeletal: No lower extremity tenderness nor edema.  No joint effusions. Neurologic:  Normal speech and language. No gross focal neurologic deficits are appreciated. No facial droop Skin:  Skin is warm, dry and intact. No rash noted. Psychiatric: Mood and affect are normal. Speech and behavior are  normal.  ____________________________________________   LABS (all labs ordered are listed, but only abnormal results are displayed)  Results for orders placed or performed during the hospital encounter of 11/17/18 (from the past 24 hour(s))  CBC with Differential/Platelet     Status: Abnormal   Collection Time: 11/17/18  2:39 PM  Result Value Ref Range   WBC 10.3 4.0 - 10.5 K/uL   RBC 2.94 (L) 4.22 - 5.81 MIL/uL   Hemoglobin 8.9 (L) 13.0 - 17.0 g/dL   HCT 26.7 (L) 39.0 - 52.0 %   MCV 90.8 80.0 - 100.0 fL   MCH 30.3 26.0 - 34.0 pg   MCHC 33.3 30.0 - 36.0 g/dL   RDW 14.1 11.5 - 15.5 %   Platelets 171 150 - 400 K/uL   nRBC 0.0 0.0 - 0.2 %   Neutrophils Relative % 83 %   Neutro Abs 8.5 (H) 1.7 - 7.7 K/uL   Lymphocytes Relative 8 %   Lymphs Abs 0.9 0.7 - 4.0 K/uL  Monocytes Relative 6 %   Monocytes Absolute 0.7 0.1 - 1.0 K/uL   Eosinophils Relative 2 %   Eosinophils Absolute 0.2 0.0 - 0.5 K/uL   Basophils Relative 1 %   Basophils Absolute 0.1 0.0 - 0.1 K/uL   Immature Granulocytes 0 %   Abs Immature Granulocytes 0.04 0.00 - 0.07 K/uL   ____________________________________________  EKG My review and personal interpretation at Time: 14:16   Indication: chest pain  Rate: 95  Rhythm: sinus Axis: normal Other: lvh criteria, nonspecific st abn that are consistent with previous ____________________________________________  RADIOLOGY  I personally reviewed all radiographic images ordered to evaluate for the above acute complaints and reviewed radiology reports and findings.  These findings were personally discussed with the patient.  Please see medical record for radiology report.  ____________________________________________   PROCEDURES  Procedure(s) performed:  Procedures    Critical Care performed: no ____________________________________________   INITIAL IMPRESSION / ASSESSMENT AND PLAN / ED COURSE  Pertinent labs & imaging results that were available during my  care of the patient were reviewed by me and considered in my medical decision making (see chart for details).   DDX: htnive urgency, electrolyte abn, chf, acs, ptx, pna, edema  KARTIER BENNISON is a 32 y.o. who presents to the ED with symptoms as described above.  Patient arrives complaining of left-sided burning chest pain.  He is hypertensive and did not complete his full dialysis session.  EKG shows no evidence of acute ischemia is grossly unchanged from previous tracings.  Describes atypical chest pain features but does have significant risk factors as he is dialysis patient.  Blood work will be sent for the above differential.  Will be given medications to treat his hypertension.  Patient will require observation in the ER for serial enzymes, hemodynamic monitoring and reassessment.      As part of my medical decision making, I reviewed the following data within the [place] notes reviewed and incorporated, Labs reviewed, notes from prior ED visits.   ____________________________________________   FINAL CLINICAL IMPRESSION(S) / ED DIAGNOSES  Final diagnoses:  Chest pain, unspecified type  Hypertension, unspecified type      NEW MEDICATIONS STARTED DURING THIS VISIT:  New Prescriptions   No medications on file     Note:  This document was prepared using Dragon voice recognition software and may include unintentional dictation errors.    Merlyn Lot, MD 11/17/18 [phone]

## 2018-11-17 NOTE — ED Notes (Addendum)
Discharge instructions reviewed with patient. Pt informed to come back if he starts to have any chest pain or SOB. Pt states he is feeling SOB at this time. Pt states he thinks it is anxiety. Pt hooked up to monitor and HR 155. MD Jimmye Norman informed and at bedside. Repeat EKG performed. Jimmye Norman advised patient he needs to be treated for his HR. Pt states he will be fine once he gets some cold washcloth on his forehead. Pt given cold washcloth. Pt advised by MD that we will recheck HR in 5-10 minutes and if not down then he will need to be admitted.

## 2018-11-17 NOTE — ED Triage Notes (Addendum)
Pt comes via [facility] from Dialysis with c/o chest pain. Pt states left sided chest pain that is stabbing and burning. Pt states 3/10 pain at this time.  EMS reports pt was about 40 minutes into dialysis when he started having intense chest pain. EMS states pt was VT-160 and at 1336 6 of adenosine and then at 1338 12 more with HR now at 90-100.  Pt did miss dialysis on Sunday. Pt normally has dialysis on MWF.

## 2018-11-17 NOTE — ED Notes (Signed)
HR rechecked and at 145. Will inform MD Jimmye Norman

## 2018-11-19 ENCOUNTER — Encounter: Payer: Self-pay | Admitting: Physician Assistant

## 2018-11-19 ENCOUNTER — Ambulatory Visit (INDEPENDENT_AMBULATORY_CARE_PROVIDER_SITE_OTHER): Payer: Medicare Other | Admitting: Physician Assistant

## 2018-11-19 VITALS — BP 223/145 | HR 94 | Temp 98.2°F | Resp 16 | Ht 74.0 in | Wt 251.0 lb

## 2018-11-19 DIAGNOSIS — F419 Anxiety disorder, unspecified: Secondary | ICD-10-CM

## 2018-11-19 DIAGNOSIS — R0789 Other chest pain: Secondary | ICD-10-CM

## 2018-11-19 DIAGNOSIS — I132 Hypertensive heart and chronic kidney disease with heart failure and with stage 5 chronic kidney disease, or end stage renal disease: Secondary | ICD-10-CM

## 2018-11-19 DIAGNOSIS — Z131 Encounter for screening for diabetes mellitus: Secondary | ICD-10-CM

## 2018-11-19 DIAGNOSIS — F32A Depression, unspecified: Secondary | ICD-10-CM

## 2018-11-19 DIAGNOSIS — R1111 Vomiting without nausea: Secondary | ICD-10-CM | POA: Diagnosis not present

## 2018-11-19 DIAGNOSIS — F329 Major depressive disorder, single episode, unspecified: Secondary | ICD-10-CM | POA: Diagnosis not present

## 2018-11-19 DIAGNOSIS — N185 Chronic kidney disease, stage 5: Secondary | ICD-10-CM

## 2018-11-19 DIAGNOSIS — R739 Hyperglycemia, unspecified: Secondary | ICD-10-CM | POA: Diagnosis not present

## 2018-11-19 DIAGNOSIS — I10 Essential (primary) hypertension: Secondary | ICD-10-CM

## 2018-11-19 NOTE — Progress Notes (Signed)
Patient: Alan Gross, Male    DOB: 14-Mar-1986, 33 y.o.   MRN: [id number] Visit Date: 11/27/2018  Today's Provider: Trinna Post, PA-C   Chief Complaint  Patient presents with  . New Patient (Initial Visit)   Subjective:    Annual physical exam Alan Gross is a 33 y.o. male who presents today for new patient appointment. He feels fairly well. He reports exercising includes none. He reports he is sleeping poorly.  Living in [place], [place]  With wife and two children, married for 7 years. Two children step son age 81 and daughter 72. 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  Stage V Kidney Failure on Hemodialysis: due to uncontrolled blood pressure. High blood pressure since age 46. Left radiocephalic fistula performed by Dr. Lucky Cowboy through which he currently receives dialysis at [facility] dialysis center MWF. Reports he sees Dr. Clover Mealy (sp?) nephrologist at the dialysis center who adjusts his blood pressure medication. He has a long history of noncompliance regarding both his blood pressure medication, finishing dialysis sessions, and signing out AMA from hospitalizations, most recently he did so on 11/17/2018 for a visit for chest pain. He has been trying to get on transplant list through [facility] and most recently [facility]. He reports his transplant orientation class has been cancelled because his insurance will not cover post transplant medications and he has been advised to crowd fund for money. He reports his nephrologist does not know about this.  Uncontrolled HTN: Reports he is only taking 10 mg amlodipine daily, losartan 100 mg daily, and minoxidil 10 mg daily. He is not taking hydralazine due to headache. He is not taking carvedilol or clonidine for reasons he cannot remember. He has trouble affording minoxidil. Frequently misses medication - misses 10 days or more a month. He cites difficulty taking medications due to  constraints of working and taking care of kids. He also has a fear of low blood pressure because he has had this happen during dialysis. He has been instructed on strategies including pill boxes and setting alarms. Has had previously low blood pressures. He has not taken his medication today.   Vomiting: Reports he will vomit randomly every day. This is not related to food or eating. Does not have bloody vomit. Says he wants a referral to GI for this.  Anxiety and Depression: Reports he struggles to keep a positive outlook on life. He tries to appear happy but describes himself as deeply very sad. He says he is ashamed of being on dialysis, feels he could have prevented this. He says people will stop him on the street and comment on his fistula and think that he is a recovering addict. He says he has had multiple traumatic events during dialysis that have made him very angry, including improperly accessing his fistula. He reports he does not frequently come to doctor's appointments because being in the doctor's office makes him too anxious. He says he has been told so many times that he needs to do certain things or he won't get a transplant that he has just given up hope. Says he is depressed by the dialysis clinic and that he is the youngest one there. He says he has friends who deal Xanax and they give him some which help.   Chest Pain: He reports chest pain and shortness of breath at rest. He says randomly he can have palpitations and wonders if it might be anxiety.  Cancer Screening: He reports he wants to be "screened for everything." He reports he wants a scan to look for all cancers because he feels like he might have something.   -----------------------------------------------------------------    Review of Systems  Constitutional: Negative.   HENT: Negative.   Eyes: Negative.   Respiratory: Negative.   Cardiovascular: Negative.   Gastrointestinal: Negative.   Endocrine: Negative.     Genitourinary: Negative.   Musculoskeletal: Negative.   Skin: Negative.   Allergic/Immunologic: Negative.   Neurological: Negative.   Hematological: Negative.   Psychiatric/Behavioral: Negative.     Social History He  reports that he has been smoking cigarettes. He has a 2.50 pack-year smoking history. He has never used smokeless tobacco. He reports current alcohol use. He reports current drug use. Drug: Marijuana. Social History   Socioeconomic History  . Marital status: Married    Spouse name: Wlliam Gross  . Number of children: Not on file  . Years of education: Not on file  . Highest education level: Not on file  Occupational History  . Occupation: Architect (Dry walling)  Social Needs  . Financial resource strain: Not on file  . Food insecurity:    Worry: Not on file    Inability: Not on file  . Transportation needs:    Medical: Not on file    Non-medical: Not on file  Tobacco Use  . Smoking status: Current Every Day Smoker    Packs/day: 0.25    Years: 10.00    Pack years: 2.50    Types: Cigarettes    Last attempt to quit: 06/22/2016    Years since quitting: 2.4  . Smokeless tobacco: Never Used  Substance and Sexual Activity  . Alcohol use: Yes    Comment: Occasionally  . Drug use: Yes    Types: Marijuana  . Sexual activity: Yes  Lifestyle  . Physical activity:    Days per week: Not on file    Minutes per session: Not on file  . Stress: Not on file  Relationships  . Social connections:    Talks on phone: Not on file    Gets together: Not on file    Attends religious service: Not on file    Active member of club or organization: Not on file    Attends meetings of clubs or organizations: Not on file    Relationship status: Not on file  Other Topics Concern  . Not on file  Social History Narrative   Active and independent at baseline    Patient Active Problem List   Diagnosis Date Noted  . Hypertensive emergency 01/14/2018  . Fluid overload  10/27/2017  . Chest pain 09/03/2017  . Elevated troponin 08/18/2017  . 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 Hypertensive urgency 04/01/2017  . ESRD (end stage renal disease) on dialysis ([place]) 07/19/2016  . Anxiety 07/19/2016  . Heart failure, diastolic, due to HTN ([place]) 07/19/2016  . Costochondritis 07/19/2016  . HTN (hypertension) 07/09/2016    Past Surgical History:  Procedure Laterality Date  . A/V FISTULAGRAM N/A 05/07/2017   Procedure: A/V Fistulagram;  Surgeon: Algernon Huxley, MD;  Location: [place] [facility];  Service: Cardiovascular;  Laterality: N/A;  . AV FISTULA PLACEMENT Left 09/10/2016   Procedure: LEFT ARTERIOVENOUS (AV) FISTULA CREATION;  Surgeon: Conrad Hollister, MD;  Location: [place];  Service: Vascular;  Laterality: Left;  . CARDIAC CATHETERIZATION Left 05/02/2015   Procedure: Left Heart Cath and Coronary Angiography;  Surgeon: Dionisio David, MD;  Location: [place] [facility];  Service: Cardiovascular;  Laterality: Left;  . CORONARY ANGIOPLASTY    . PERIPHERAL VASCULAR CATHETERIZATION N/A 07/11/2016   Procedure: Dialysis/Perma Catheter Insertion;  Surgeon: Algernon Huxley, MD;  Location: [place] [facility];  Service: Cardiovascular;  Laterality: N/A;  . PERIPHERAL VASCULAR THROMBECTOMY Left 05/07/2017   Procedure: Peripheral Vascular Thrombectomy;  Surgeon: Algernon Huxley, MD;  Location: [place] [facility];  Service: Cardiovascular;  Laterality: Left;    Family History  Family Status  Relation Name Status  . Other  (Not Specified)  . Mother  Alive  . Father  Deceased  . Brother  (Not Specified)  . Ethlyn Daniels  (Not Specified)  . Annamarie Major  (Not Specified)   His family history includes Cancer in his father; Diabetes in his paternal aunt and paternal uncle; Hypertension in his brother and another family member.     Allergies  Allergen Reactions  . Bee Venom Anaphylaxis  . Lasix [Furosemide] Shortness Of Breath, Swelling and  Anxiety    Previous Medications   AMLODIPINE (NORVASC) 10 MG TABLET    Take 1 tablet (10 mg total) by mouth every evening.   ASPIRIN 81 MG TABLET    Take 1 tablet (81 mg total) by mouth daily.   CARVEDILOL (COREG) 25 MG TABLET    Take 1 tablet (25 mg total) by mouth 2 (two) times daily with a meal.   CLONIDINE (CATAPRES) 0.2 MG TABLET    Take 1 tablet (0.2 mg total) by mouth 3 (three) times daily.   HYDRALAZINE (APRESOLINE) 50 MG TABLET    Take 1 tablet (50 mg total) by mouth 2 (two) times daily.   LORAZEPAM (ATIVAN) 1 MG TABLET    Take 1 tablet (1 mg total) by mouth 2 (two) times daily as needed for anxiety.   LOSARTAN (COZAAR) 100 MG TABLET    Take 1 tablet (100 mg total) by mouth every evening.   MINOXIDIL (LONITEN) 10 MG TABLET    Take 10 mg by mouth 2 (two) times daily with a meal.    SUCROFERRIC OXYHYDROXIDE (VELPHORO) 500 MG CHEWABLE TABLET    Chew 500 mg by mouth 4 (four) times daily.    Patient Care Team: Paulene Floor as PCP - General (Physician Assistant) Wellington Hampshire, MD as Consulting Physician (Cardiology) Anthonette Legato, MD (Internal Medicine)      Objective:   Vitals: BP (!) 223/145 (BP Location: Right Arm, Patient Position: Sitting, Cuff Size: Large)   Pulse 94   Temp 98.2 F (36.8 C) (Oral)   Resp 16   Ht 6\' 2"  (1.88 m)   Wt 251 lb (113.9 kg)   SpO2 97%   BMI 32.23 kg/m    Physical Exam Constitutional:      Appearance: Normal appearance.  Cardiovascular:     Rate and Rhythm: Normal rate and regular rhythm.  Pulmonary:     Effort: Pulmonary effort is normal.     Breath sounds: Normal breath sounds.  Abdominal:     General: Abdomen is flat.     Palpations: Abdomen is soft.  Musculoskeletal:        General: No swelling.       Arms:  Skin:    General: Skin is warm and dry.  Neurological:     General: No focal deficit present.     Mental Status: He is alert and oriented to person, place, and time.  Psychiatric:  Mood and Affect:  Mood is depressed.        Behavior: Behavior normal.      Depression Screen PHQ 2/9 Scores 11/19/2018 07/19/2016  PHQ - 2 Score 1 0  PHQ- 9 Score 5 1      Assessment & Plan:     Routine Health Maintenance and Physical Exam  Exercise Activities and Dietary recommendations Goals   None     Immunization History  Administered Date(s) Administered  . PPD Test 07/10/2016    Health Maintenance  Topic Date Due  . TETANUS/TDAP  02/05/2003  . INFLUENZA VACCINE  06/18/2018  . HIV Screening  Completed     Discussed health benefits of physical activity, and encouraged him to engage in regular exercise appropriate for his age and condition.    1. Essential hypertension  Blood pressure extremely high. He does not have chest pain or headache now. He declines going to the hospital. He needs to take his medications regularly and he has been counseled extensively regarding this per his report and according to chart review. He will need a cardiology referral due to chest pain. At the time of writing this note, he has seen cardiology on 11/23/2018 where his BP was 140/90.  I have informed him that there is no screening for cancer that he is eligible for at his age and with his history. We do not do full body scans to look for all sorts of cancers.  - Ambulatory referral to Cardiology - Comprehensive Metabolic Panel (CMET) - Lipid Profile - TSH  2. Other chest pain  - Ambulatory referral to Cardiology  3. 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 Patient has long history of noncompliance both with medications and attending office visits.  - Ambulatory referral to Cardiology  4. Vomiting without nausea, intractability of vomiting not specified, unspecified vomiting type   Do suspect this may be due to uremia as he is noncompliant with follow up and dialysis sessions. - Ambulatory  referral to Gastroenterology  5. Diabetes mellitus screening  - HgB A1c  6. Hyperglycemia  - HgB A1c  7. Anxiety and depression  Can start on 25 mg zoloft daily for the above. Xanax metabolized by the liver so technically should not pose an issue but his use of Xanax not prescribed to him in the absence of appropriate mental health follow up constitutes abuse.  Return in about 4 weeks (around 12/17/2018) for depression.  The entirety of the information documented in the History of Present Illness, Review of Systems and Physical Exam were personally obtained by me. Portions of this information were initially documented by Lynford Humphrey, CMA and reviewed by me for thoroughness and accuracy.   I have spent 60 minutes with this patient, >50% of which was spent on counseling and coordination of care.  --------------------------------------------------------------------

## 2018-11-20 ENCOUNTER — Telehealth: Payer: Self-pay

## 2018-11-20 ENCOUNTER — Other Ambulatory Visit: Payer: Self-pay | Admitting: Physician Assistant

## 2018-11-20 DIAGNOSIS — N186 End stage renal disease: Secondary | ICD-10-CM | POA: Diagnosis not present

## 2018-11-20 DIAGNOSIS — D509 Iron deficiency anemia, unspecified: Secondary | ICD-10-CM | POA: Diagnosis not present

## 2018-11-20 DIAGNOSIS — F419 Anxiety disorder, unspecified: Secondary | ICD-10-CM

## 2018-11-20 DIAGNOSIS — E118 Type 2 diabetes mellitus with unspecified complications: Secondary | ICD-10-CM | POA: Diagnosis not present

## 2018-11-20 DIAGNOSIS — D649 Anemia, unspecified: Secondary | ICD-10-CM | POA: Diagnosis not present

## 2018-11-20 DIAGNOSIS — N2581 Secondary hyperparathyroidism of renal origin: Secondary | ICD-10-CM | POA: Diagnosis not present

## 2018-11-20 LAB — COMPREHENSIVE METABOLIC PANEL
ALT: 14 IU/L (ref 0–44)
AST: 16 IU/L (ref 0–40)
Albumin/Globulin Ratio: 2 (ref 1.2–2.2)
Albumin: 3.9 g/dL (ref 3.5–5.5)
Alkaline Phosphatase: 49 IU/L (ref 39–117)
BUN/Creatinine Ratio: 5 — ABNORMAL LOW (ref 9–20)
BUN: 65 mg/dL — ABNORMAL HIGH (ref 6–20)
Bilirubin Total: 0.5 mg/dL (ref 0.0–1.2)
CO2: 21 mmol/L (ref 20–29)
Calcium: 9.1 mg/dL (ref 8.7–10.2)
Chloride: 98 mmol/L (ref 96–106)
Creatinine, Ser: 12.94 mg/dL — ABNORMAL HIGH (ref 0.76–1.27)
GFR calc Af Amer: 5 mL/min/{1.73_m2} — ABNORMAL LOW (ref >59)
GFR calc non Af Amer: 4 mL/min/{1.73_m2} — ABNORMAL LOW (ref >59)
Globulin, Total: 2 g/dL (ref 1.5–4.5)
Glucose: 104 mg/dL — ABNORMAL HIGH (ref 65–99)
Potassium: 3.4 mmol/L — ABNORMAL LOW (ref 3.5–5.2)
Sodium: 138 mmol/L (ref 134–144)
Total Protein: 5.9 g/dL — ABNORMAL LOW (ref 6.0–8.5)

## 2018-11-20 LAB — TSH: TSH: 1.54 u[IU]/mL (ref 0.450–4.500)

## 2018-11-20 LAB — LIPID PANEL
Chol/HDL Ratio: 4.2 ratio (ref 0.0–5.0)
Cholesterol, Total: 134 mg/dL (ref 100–199)
HDL: 32 mg/dL — ABNORMAL LOW (ref >39)
LDL Calculated: 83 mg/dL (ref 0–99)
Triglycerides: 95 mg/dL (ref 0–149)
VLDL Cholesterol Cal: 19 mg/dL (ref 5–40)

## 2018-11-20 LAB — HEMOGLOBIN A1C
Est. average glucose Bld gHb Est-mCnc: 111 mg/dL
Hgb A1c MFr Bld: 5.5 % (ref 4.8–5.6)

## 2018-11-20 MED ORDER — SERTRALINE HCL 25 MG PO TABS: 25.0000 mg | ORAL_TABLET | Freq: Every day | ORAL | 0 refills | Status: DC

## 2018-11-20 NOTE — Telephone Encounter (Signed)
LVMTRC 

## 2018-11-20 NOTE — Telephone Encounter (Signed)
-----   Message from Trinna Post, [place] sent at 11/20/2018  2:11 PM EST ----- Labs consistent with end stage renal disease. He needs to attend his dialysis as his serum creatinine is rising and he did not finish full session. He needs to attend his session and if he cannot do that he needs to be dialyzed in the emergency room. We can start an anti-depressant, zoloft 25 mg nightly. I'll send it in. Remainder of labwork is normal. He needs to take his blood pressure medications and follow up closely with his nephrologist.

## 2018-11-23 ENCOUNTER — Encounter: Payer: Self-pay | Admitting: *Deleted

## 2018-11-23 DIAGNOSIS — Z6833 Body mass index (BMI) 33.0-33.9, adult: Secondary | ICD-10-CM | POA: Diagnosis not present

## 2018-11-23 DIAGNOSIS — N186 End stage renal disease: Secondary | ICD-10-CM | POA: Diagnosis not present

## 2018-11-23 DIAGNOSIS — Z9119 Patient's noncompliance with other medical treatment and regimen: Secondary | ICD-10-CM | POA: Diagnosis not present

## 2018-11-23 DIAGNOSIS — I208 Other forms of angina pectoris: Secondary | ICD-10-CM | POA: Diagnosis not present

## 2018-11-23 DIAGNOSIS — E118 Type 2 diabetes mellitus with unspecified complications: Secondary | ICD-10-CM | POA: Diagnosis not present

## 2018-11-23 DIAGNOSIS — I10 Essential (primary) hypertension: Secondary | ICD-10-CM | POA: Diagnosis not present

## 2018-11-23 DIAGNOSIS — R002 Palpitations: Secondary | ICD-10-CM | POA: Diagnosis not present

## 2018-11-23 DIAGNOSIS — F17200 Nicotine dependence, unspecified, uncomplicated: Secondary | ICD-10-CM | POA: Diagnosis not present

## 2018-11-23 DIAGNOSIS — R0602 Shortness of breath: Secondary | ICD-10-CM | POA: Diagnosis not present

## 2018-11-23 DIAGNOSIS — D649 Anemia, unspecified: Secondary | ICD-10-CM | POA: Diagnosis not present

## 2018-11-23 DIAGNOSIS — D509 Iron deficiency anemia, unspecified: Secondary | ICD-10-CM | POA: Diagnosis not present

## 2018-11-23 DIAGNOSIS — R000 Tachycardia, unspecified: Secondary | ICD-10-CM | POA: Diagnosis not present

## 2018-11-23 DIAGNOSIS — E6609 Other obesity due to excess calories: Secondary | ICD-10-CM | POA: Diagnosis not present

## 2018-11-23 DIAGNOSIS — N2581 Secondary hyperparathyroidism of renal origin: Secondary | ICD-10-CM | POA: Diagnosis not present

## 2018-11-25 DIAGNOSIS — N2581 Secondary hyperparathyroidism of renal origin: Secondary | ICD-10-CM | POA: Diagnosis not present

## 2018-11-25 DIAGNOSIS — N186 End stage renal disease: Secondary | ICD-10-CM | POA: Diagnosis not present

## 2018-11-25 DIAGNOSIS — D649 Anemia, unspecified: Secondary | ICD-10-CM | POA: Diagnosis not present

## 2018-11-25 DIAGNOSIS — E118 Type 2 diabetes mellitus with unspecified complications: Secondary | ICD-10-CM | POA: Diagnosis not present

## 2018-11-25 DIAGNOSIS — D509 Iron deficiency anemia, unspecified: Secondary | ICD-10-CM | POA: Diagnosis not present

## 2018-11-25 NOTE — Telephone Encounter (Signed)
Patient advised as below. Patient verbalizes understanding and is in agreement with treatment plan.  

## 2018-11-26 ENCOUNTER — Ambulatory Visit: Payer: Medicare Other | Admitting: Gastroenterology

## 2018-11-27 DIAGNOSIS — N186 End stage renal disease: Secondary | ICD-10-CM | POA: Diagnosis not present

## 2018-11-27 DIAGNOSIS — N2581 Secondary hyperparathyroidism of renal origin: Secondary | ICD-10-CM | POA: Diagnosis not present

## 2018-11-27 DIAGNOSIS — D509 Iron deficiency anemia, unspecified: Secondary | ICD-10-CM | POA: Diagnosis not present

## 2018-11-27 DIAGNOSIS — E118 Type 2 diabetes mellitus with unspecified complications: Secondary | ICD-10-CM | POA: Diagnosis not present

## 2018-11-27 DIAGNOSIS — D649 Anemia, unspecified: Secondary | ICD-10-CM | POA: Diagnosis not present

## 2018-11-27 NOTE — Patient Instructions (Signed)
Dialysis Dialysis is a procedure that is done when the kidneys have stopped working properly (kidney failure). It may also be done earlier if it may help improve symptoms. During dialysis, wastes, salt, and extra water are removed from the blood, and the levels of certain minerals in the blood are maintained. Dialysis is done in sessions which are continued until the kidneys get better. If the kidneys cannot get better, such as in end-stage kidney disease, dialysis is continued for life or until you receive a new kidney from a donor (kidney transplant). There are two types of dialysis: hemodialysis and peritoneal dialysis. What is hemodialysis?        Hemodialysis is when a machine called a dialyzer is used to filter the blood. Before starting hemodialysis, you will have surgery to create a site where blood can be removed from the body and returned to the body (vascular access). There are three types of vascular accesses:  Arteriovenous fistula. This type of access is created when an artery and a vein (usually in the arm) are connected during surgery. The arteriovenous fistula usually takes 1-6 months to develop after surgery. It may last longer than the other types of vascular accesses and is less likely to become infected or cause blood clots.  Arteriovenous graft. This type of access is created when an artery and a vein in the arm are connected during surgery with a tube. An arteriovenous graft can usually be used within 2-3 weeks of surgery.  A venous catheter. To create this type of access, a thin tube (catheter) is placed in a large vein in your neck, chest, or groin. A venous catheter can be used right away. It is usually used as a temporary access when dialysis needs to begin immediately. During hemodialysis, blood leaves your body through your access site. It travels through a tube to the dialyzer, where it is filtered. The blood then returns to your body through another tube. Hemodialysis  is usually done at a hospital or dialysis center three times a week. Visits last about 3-5 hours. With special training, it may also be done at home with the help of another person. What is peritoneal dialysis? Peritoneal dialysis is when the thin lining of the abdomen (peritoneum) and a fluid called dialysate are used to filter the blood. Before starting peritoneal dialysis, you will have surgery to place a catheter in your abdomen. The catheter will be used to transfer dialysate to and from your abdomen. At the start of a session, your abdomen is filled with dialysate. During the session, wastes, salt, and extra water in the blood pass through the peritoneum and into the dialysate. The dialysate is drained from the body at the end of the session. The process of filling and draining the dialysate is called an exchange. Exchanges are repeated until you have used up all the dialysate for the day. You may do peritoneal dialysis at home or at almost any other location. It is done every day. You may need up to five exchanges a day. Each exchange takes about 30-40 minutes. The amount of time the dialysate is in your body between exchanges is called a dwell. The dwell usually lasts 1.5-3 hours and can vary with each person. You may choose to do exchanges at night while you sleep, using a machine called a cycler. Which type of dialysis should I choose? Both types of dialysis have advantages and disadvantages. Talk with your health care provider about which type of dialysis is best  for you. Your lifestyle, preferences, and medical condition should be considered. In some cases, only one type of dialysis can be chosen. Advantages of hemodialysis  It is done less often than peritoneal dialysis.  Someone else can do the dialysis for you.  If you go to a dialysis center: ? Your health care provider can recognize any problems you may be having. ? You can interact with others who are having dialysis. This can  provide you with emotional support. Disadvantages of hemodialysis  Hemodialysis may cause cramps and low blood pressure. It may leave you feeling tired on the days you have the treatment.  If you go to a dialysis center, you will need to make weekly appointments and work around the center's schedule.  You will need to take extra care when traveling. If you usually get treatment in a dialysis center, you will need to arrange to visit a dialysis center near your destination. If you are having treatments at home, you will need to take the dialyzer with you when traveling.  There are more eating restrictions than with peritoneal dialysis. Advantages of peritoneal dialysis  It is less likely than hemodialysis to cause cramps and low blood pressure.  There are fewer eating restrictions than with hemodialysis.  You may do exchanges on your own wherever you are, including when you travel. Disadvantages of peritoneal dialysis  It is done more often than hemodialysis.  Doing peritoneal dialysis requires you to have a good use (dexterity) of your hands. You must also be able to lift bags.  You must learn how to make your equipment free of germs (sterilization techniques). You will need to use these techniques every day to prevent infection. What changes will I need to make to my diet during dialysis? Both types of dialysis require you to make some changes to your diet. For example, you will need to limit your intake of foods that contain a lot of phosphorus and potassium. You will also need to limit your fluid intake. A diet and nutrition specialist (dietitian) can help you make a meal plan that can help improve your dialysis and your health. What should I expect when starting dialysis? Adjusting to the dialysis treatment, schedule, and diet can take some time. You may need to stop working and may not be able to do some of your normal activities. You may feel anxious or depressed when starting  dialysis. Over time, many people feel better overall because of dialysis. You may be able to return to work after making some changes, such as reducing work intensity. Where to find more information  [place]: www.kidney.org  American Association of Kidney Patients: BombTimer.gl  American Kidney Fund: www.kidneyfund.org Summary  During dialysis, wastes, salt, and extra water are removed from the blood, and the levels of certain minerals in the blood are maintained. There are two types of dialysis: hemodialysis and peritoneal dialysis.  Hemodialysis is when a machine called a dialyzer is used to filter the blood.  Hemodialysis is usually done by a health care provider at a hospital or dialysis center three times a week.  Peritoneal dialysis is when the peritoneum is used as a filter. You may do peritoneal dialysis at home or at almost any other location.  Both types of dialysis have advantages and disadvantages. Talk with your health care provider about which type of dialysis is best for you. This information is not intended to replace advice given to you by your health care provider. Make sure you  discuss any questions you have with your health care provider. Document Released: 01/25/2003 Document Revised: 12/31/2016 Document Reviewed: 12/31/2016 Elsevier Interactive Patient Education  2019 [employer].

## 2018-11-30 DIAGNOSIS — N2581 Secondary hyperparathyroidism of renal origin: Secondary | ICD-10-CM | POA: Diagnosis not present

## 2018-11-30 DIAGNOSIS — D649 Anemia, unspecified: Secondary | ICD-10-CM | POA: Diagnosis not present

## 2018-11-30 DIAGNOSIS — D509 Iron deficiency anemia, unspecified: Secondary | ICD-10-CM | POA: Diagnosis not present

## 2018-11-30 DIAGNOSIS — N186 End stage renal disease: Secondary | ICD-10-CM | POA: Diagnosis not present

## 2018-11-30 DIAGNOSIS — E118 Type 2 diabetes mellitus with unspecified complications: Secondary | ICD-10-CM | POA: Diagnosis not present

## 2018-12-01 ENCOUNTER — Other Ambulatory Visit: Payer: Self-pay | Admitting: Physician Assistant

## 2018-12-01 NOTE — Telephone Encounter (Signed)
I did not agree to write for this medication. This was something he received in the ER. We have started zoloft. Will see him in clinic for follow of zoloft.

## 2018-12-01 NOTE — Telephone Encounter (Signed)
Pt never got around to picking up the LORazepam (ATIVAN) 1 MG tablet prescribed by Fabio Bering. Pt asking for this to be called back into:  [facility] [address], [place] [phone] (Phone) [phone] (Fax)   Thanks, [facility]

## 2018-12-02 NOTE — Telephone Encounter (Signed)
Patient advised as below.  

## 2018-12-04 DIAGNOSIS — N186 End stage renal disease: Secondary | ICD-10-CM | POA: Diagnosis not present

## 2018-12-04 DIAGNOSIS — D509 Iron deficiency anemia, unspecified: Secondary | ICD-10-CM | POA: Diagnosis not present

## 2018-12-04 DIAGNOSIS — N2581 Secondary hyperparathyroidism of renal origin: Secondary | ICD-10-CM | POA: Diagnosis not present

## 2018-12-04 DIAGNOSIS — D649 Anemia, unspecified: Secondary | ICD-10-CM | POA: Diagnosis not present

## 2018-12-04 DIAGNOSIS — E118 Type 2 diabetes mellitus with unspecified complications: Secondary | ICD-10-CM | POA: Diagnosis not present

## 2018-12-07 DIAGNOSIS — E118 Type 2 diabetes mellitus with unspecified complications: Secondary | ICD-10-CM | POA: Diagnosis not present

## 2018-12-07 DIAGNOSIS — N186 End stage renal disease: Secondary | ICD-10-CM | POA: Diagnosis not present

## 2018-12-07 DIAGNOSIS — D649 Anemia, unspecified: Secondary | ICD-10-CM | POA: Diagnosis not present

## 2018-12-07 DIAGNOSIS — N2581 Secondary hyperparathyroidism of renal origin: Secondary | ICD-10-CM | POA: Diagnosis not present

## 2018-12-07 DIAGNOSIS — D509 Iron deficiency anemia, unspecified: Secondary | ICD-10-CM | POA: Diagnosis not present

## 2018-12-08 ENCOUNTER — Ambulatory Visit (INDEPENDENT_AMBULATORY_CARE_PROVIDER_SITE_OTHER): Payer: Medicare Other | Admitting: Gastroenterology

## 2018-12-08 ENCOUNTER — Encounter: Payer: Self-pay | Admitting: Gastroenterology

## 2018-12-08 VITALS — BP 174/88 | HR 93 | Ht 74.0 in | Wt 258.4 lb

## 2018-12-08 DIAGNOSIS — K625 Hemorrhage of anus and rectum: Secondary | ICD-10-CM

## 2018-12-08 DIAGNOSIS — R112 Nausea with vomiting, unspecified: Secondary | ICD-10-CM | POA: Diagnosis not present

## 2018-12-08 DIAGNOSIS — R195 Other fecal abnormalities: Secondary | ICD-10-CM | POA: Diagnosis not present

## 2018-12-08 NOTE — Progress Notes (Signed)
Alan Gross [address]  [place]  [name], [place] [address]  Main: [phone]  Fax: [phone]   Gastroenterology Consultation  Referring Provider:     Paulene Floor Primary Care Physician:  Paulene Floor Reason for Consultation:     Nausea, blood in stool        HPI:    Chief Complaint  Patient presents with  . New Patient (Initial Visit)    referral from [facility] for vomiting. pt states has dark blood in stool.    Alan Gross is a 33 y.o. y/o male referred for consultation & management  by Dr. Terrilee Croak, Wendee Beavers, PA-C.  Patient reports 3 to 69-month history of intermittent nausea and vomiting, and also reports intermittent blood in stool that he describes as dark red stool intermittently.  Does not strain when he is having the blood in stool.  No weight loss.  Does not feel any skin tags.  No prior history of colonoscopy or EGD.  Denies any dysphagia.  No family history of colon cancer.  Comorbidities include ESRD for which he is on hemodialysis.  He misses his schedule sessions at times, but states has not missed one recently and if he does miss 1 he will make up for it the next day and has at least 3 sessions a week.  Is undergoing cardiology work-up due to elevated blood pressures and cardiology has ordered echo and stress test which patient is having done next week.  Past Medical History:  Diagnosis Date  . Anxiety   . Depression   . Dyspnea   . ESRD (end stage renal disease) ([place])   . H/O cardiac catheterization   . History of febrile seizure   . Hypertension   . Irregular heart beats   . Kidney failure    M,W,F [place] in [place]; 15 %   . Stroke (cerebrum) ([place])    no residual effects    Past Surgical History:  Procedure Laterality Date  . A/V FISTULAGRAM N/A 05/07/2017   Procedure: A/V Fistulagram;  Surgeon: Algernon Huxley, MD;  Location: [facility];  Service: Cardiovascular;  Laterality: N/A;    . AV FISTULA PLACEMENT Left 09/10/2016   Procedure: LEFT ARTERIOVENOUS (AV) FISTULA CREATION;  Surgeon: Conrad Hick[place]ers, MD;  Location: [facility];  Service: Vascular;  Laterality: Left;  . CARDIAC CATHETERIZATION Left 05/02/2015   Procedure: Left Heart Cath and Coronary Angiography;  Surgeon: Dionisio David, MD;  Location: [facility];  Service: Cardiovascular;  Laterality: Left;  . CORONARY ANGIOPLASTY    . PERIPHERAL VASCULAR CATHETERIZATION N/A 07/11/2016   Procedure: Dialysis/Perma Catheter Insertion;  Surgeon: Algernon Huxley, MD;  Location: [facility];  Service: Cardiovascular;  Laterality: N/A;  . PERIPHERAL VASCULAR THROMBECTOMY Left 05/07/2017   Procedure: Peripheral Vascular Thrombectomy;  Surgeon: Algernon Huxley, MD;  Location: [facility];  Service: Cardiovascular;  Laterality: Left;    Prior to Admission medications   Medication Sig Start Date End Date Taking? Authorizing Provider  amLODipine (NORVASC) 10 MG tablet Take 1 tablet (10 mg total) by mouth every evening. 07/10/17  Yes Awilda Bill, NP  losartan (COZAAR) 100 MG tablet Take 1 tablet (100 mg total) by mouth every evening. 09/04/17  Yes Gladstone Lighter, MD  minoxidil (LONITEN) 10 MG tablet Take 10 mg by mouth 2 (two) times daily with a meal.    Yes [provider]  sertraline (  ZOLOFT) 25 MG tablet Take 1 tablet (25 mg total) by mouth daily. 11/20/18 02/18/19 Yes Trinna Post, PA-C  sucroferric oxyhydroxide (VELPHORO) 500 MG chewable tablet Chew 500 mg by mouth 4 (four) times daily.   Yes [provider]  aspirin 81 MG tablet Take 1 tablet (81 mg total) by mouth daily. Patient not taking: Reported on 01/13/2018 09/04/17   Gladstone Lighter, MD  carvedilol (COREG) 25 MG tablet Take 1 tablet (25 mg total) by mouth 2 (two) times daily with a meal. Patient not taking: Reported on 01/13/2018 07/10/17   Awilda Bill, NP  cloNIDine (CATAPRES) 0.2 MG tablet Take 1 tablet (0.2 mg total) by  mouth 3 (three) times daily. Patient not taking: Reported on 03/29/2018 09/04/17   Gladstone Lighter, MD  hydrALAZINE (APRESOLINE) 50 MG tablet Take 1 tablet (50 mg total) by mouth 2 (two) times daily. Patient not taking: Reported on 03/29/2018 09/04/17   Gladstone Lighter, MD  LORazepam (ATIVAN) 1 MG tablet Take 1 tablet (1 mg total) by mouth 2 (two) times daily as needed for anxiety. Patient not taking: Reported on 12/08/2018 11/17/18 11/17/19  Earleen Newport, MD    Family History  Problem Relation Age of Onset  . Hypertension Other   . Cancer Father        liver  . Hypertension Brother   . Diabetes Paternal Aunt   . Diabetes Paternal Uncle      Social History   Tobacco Use  . Smoking status: Current Every Day Smoker    Packs/day: 0.25    Years: 10.00    Pack years: 2.50    Types: Cigarettes    Last attempt to quit: 06/22/2016    Years since quitting: 2.4  . Smokeless tobacco: Never Used  Substance Use Topics  . Alcohol use: Not Currently    Comment: Occasionally  . Drug use: Yes    Types: Marijuana    Allergies as of 12/08/2018 - Review Complete 11/19/2018  Allergen Reaction Noted  . Bee venom Anaphylaxis 03/29/2018  . Lasix [furosemide] Shortness Of Breath, Swelling, and Anxiety 04/01/2017    Review of Systems:    All systems reviewed and negative except where noted in HPI.   Physical Exam:  BP (!) 174/88   Pulse 93   Ht 6\' 2"  (1.88 m)   Wt 258 lb 6.4 oz (117.2 kg)   BMI 33.18 kg/m  No LMP for male patient. Psych:  Alert and cooperative. Normal mood and affect. General:   Alert,  Well-developed, well-nourished, pleasant and cooperative in NAD Head:  Normocephalic and atraumatic. Eyes:  Sclera clear, no icterus.   Conjunctiva pink. Ears:  Normal auditory acuity. Nose:  No deformity, discharge, or lesions. Mouth:  No deformity or lesions,oropharynx pink & moist. Neck:  Supple; no masses or thyromegaly. Abdomen:  Normal bowel sounds.  No bruits.  Soft,  non-tender and non-distended without masses, hepatosplenomegaly or hernias noted.  No guarding or rebound tenderness.    Msk:  Symmetrical without gross deformities. Good, equal movement & strength bilaterally. Pulses:  Normal pulses noted. Extremities:  No clubbing or edema.  No cyanosis. Neurologic:  Alert and oriented x3;  grossly normal neurologically. Skin:  Intact without significant lesions or rashes. No jaundice. Lymph Nodes:  No significant cervical adenopathy. Psych:  Alert and cooperative. Normal mood and affect.   Labs: CBC    Component Value Date/Time   WBC 10.3 11/17/2018 1439   RBC 2.94 (L) 11/17/2018 1439   HGB  8.9 (L) 11/17/2018 1439   HGB 14.6 02/14/2015 1944   HCT 26.7 (L) 11/17/2018 1439   HCT 43.1 02/14/2015 1944   PLT 171 11/17/2018 1439   PLT 145 (L) 02/14/2015 1944   MCV 90.8 11/17/2018 1439   MCV 85 02/14/2015 1944   MCH 30.3 11/17/2018 1439   MCHC 33.3 11/17/2018 1439   RDW 14.1 11/17/2018 1439   RDW 13.8 02/14/2015 1944   LYMPHSABS 0.9 11/17/2018 1439   MONOABS 0.7 11/17/2018 1439   EOSABS 0.2 11/17/2018 1439   BASOSABS 0.1 11/17/2018 1439   CMP     Component Value Date/Time   NA 138 11/19/2018 1527   NA 139 02/14/2015 1944   K 3.4 (L) 11/19/2018 1527   K 3.4 (L) 02/14/2015 1944   CL 98 11/19/2018 1527   CL 101 02/14/2015 1944   CO2 21 11/19/2018 1527   CO2 32 02/14/2015 1944   GLUCOSE 104 (H) 11/19/2018 1527   GLUCOSE 111 (H) 11/17/2018 1439   GLUCOSE 105 (H) 02/14/2015 1944   BUN 65 (H) 11/19/2018 1527   BUN 13 02/14/2015 1944   CREATININE 12.94 (H) 11/19/2018 1527   CREATININE 1.12 02/14/2015 1944   CALCIUM 9.1 11/19/2018 1527   CALCIUM 9.4 02/14/2015 1944   PROT 5.9 (L) 11/19/2018 1527   PROT 7.2 10/14/2013 1959   ALBUMIN 3.9 11/19/2018 1527   ALBUMIN 4.0 10/14/2013 1959   AST 16 11/19/2018 1527   AST 26 10/14/2013 1959   ALT 14 11/19/2018 1527   ALT 31 10/14/2013 1959   ALKPHOS 49 11/19/2018 1527   ALKPHOS 73 10/14/2013  1959   BILITOT 0.5 11/19/2018 1527   BILITOT 0.2 10/14/2013 1959   GFRNONAA 4 (L) 11/19/2018 1527   GFRNONAA >60 02/14/2015 1944   GFRAA 5 (L) 11/19/2018 1527   GFRAA >60 02/14/2015 1944    Imaging Studies: Dg Chest 2 View  Result Date: 11/17/2018 CLINICAL DATA:  Chest pain EXAM: CHEST - 2 VIEW COMPARISON:  10/09/2018 FINDINGS: Cardiomegaly with vascular congestion. Trace pleural effusion. Streaky atelectasis at the left base. No pneumothorax. IMPRESSION: 1. Cardiomegaly with vascular congestion and trace pleural effusion 2. Streaky atelectasis at the left lung base. Electronically Signed   By: Donavan Foil M.D.   On: 11/17/2018 15:01    Assessment and Plan:   Alan Gross is a 33 y.o. y/o male has been referred for intermittent nausea and vomiting and blood in stool  I discussed with him that his blood in stool could be due to something benign as hemorrhoids, however, he also has recent anemia and therefore in the setting of his ongoing nausea and vomiting and above symptoms, EGD and colonoscopy is indicated to rule out any underlying lesion such as peptic ulcer disease or malignancy.  He is hemodynamically stable, and does not have any diarrhea.  Therefore, no signs and symptoms of upper GI bleed or IBD.  No indication for urgent endoscopy at this time.  In addition, he has elevated blood pressures and cardiology work-up is pending.  Therefore, will need cardiology clearance prior to endoscopic procedures to allow for safe procedures with no complications.  I will repeat his hemoglobin today as it was 8.9 on December 31.  If the intermittent rectal bleeding worsens, increases in frequency or amount I have asked the patient to go to the ER and he may need to be admitted for cardiology work-up to be done as an inpatient procedures to be done as an inpatient.  Currently he is  hemodynamically stable and if his repeat hemoglobin today has not dropped significantly, we can continue with  outpatient work-up after cardiology clearance.  Patient will follow-up with [place] in clinic in 3 to 4 weeks and if cardiology work-up is complete and he is cleared for the procedures procedures can be scheduled at that time     Dr Alan Gross  Speech recognition software was used to dictate the above note.

## 2018-12-09 LAB — HEMOGLOBIN: Hemoglobin: 9.3 g/dL — ABNORMAL LOW (ref 13.0–17.7)

## 2018-12-10 DIAGNOSIS — I208 Other forms of angina pectoris: Secondary | ICD-10-CM | POA: Diagnosis not present

## 2018-12-10 DIAGNOSIS — R0602 Shortness of breath: Secondary | ICD-10-CM | POA: Diagnosis not present

## 2018-12-11 DIAGNOSIS — D509 Iron deficiency anemia, unspecified: Secondary | ICD-10-CM | POA: Diagnosis not present

## 2018-12-11 DIAGNOSIS — N2581 Secondary hyperparathyroidism of renal origin: Secondary | ICD-10-CM | POA: Diagnosis not present

## 2018-12-11 DIAGNOSIS — E118 Type 2 diabetes mellitus with unspecified complications: Secondary | ICD-10-CM | POA: Diagnosis not present

## 2018-12-11 DIAGNOSIS — N186 End stage renal disease: Secondary | ICD-10-CM | POA: Diagnosis not present

## 2018-12-11 DIAGNOSIS — D649 Anemia, unspecified: Secondary | ICD-10-CM | POA: Diagnosis not present

## 2018-12-14 DIAGNOSIS — E118 Type 2 diabetes mellitus with unspecified complications: Secondary | ICD-10-CM | POA: Diagnosis not present

## 2018-12-14 DIAGNOSIS — D509 Iron deficiency anemia, unspecified: Secondary | ICD-10-CM | POA: Diagnosis not present

## 2018-12-14 DIAGNOSIS — D649 Anemia, unspecified: Secondary | ICD-10-CM | POA: Diagnosis not present

## 2018-12-14 DIAGNOSIS — N2581 Secondary hyperparathyroidism of renal origin: Secondary | ICD-10-CM | POA: Diagnosis not present

## 2018-12-14 DIAGNOSIS — N186 End stage renal disease: Secondary | ICD-10-CM | POA: Diagnosis not present

## 2018-12-15 DIAGNOSIS — Z6833 Body mass index (BMI) 33.0-33.9, adult: Secondary | ICD-10-CM | POA: Diagnosis not present

## 2018-12-15 DIAGNOSIS — F17200 Nicotine dependence, unspecified, uncomplicated: Secondary | ICD-10-CM | POA: Diagnosis not present

## 2018-12-15 DIAGNOSIS — R0602 Shortness of breath: Secondary | ICD-10-CM | POA: Diagnosis not present

## 2018-12-15 DIAGNOSIS — I429 Cardiomyopathy, unspecified: Secondary | ICD-10-CM | POA: Diagnosis not present

## 2018-12-15 DIAGNOSIS — I208 Other forms of angina pectoris: Secondary | ICD-10-CM | POA: Diagnosis not present

## 2018-12-15 DIAGNOSIS — Z9119 Patient's noncompliance with other medical treatment and regimen: Secondary | ICD-10-CM | POA: Diagnosis not present

## 2018-12-15 DIAGNOSIS — R000 Tachycardia, unspecified: Secondary | ICD-10-CM | POA: Diagnosis not present

## 2018-12-15 DIAGNOSIS — R002 Palpitations: Secondary | ICD-10-CM | POA: Diagnosis not present

## 2018-12-15 DIAGNOSIS — I10 Essential (primary) hypertension: Secondary | ICD-10-CM | POA: Diagnosis not present

## 2018-12-15 DIAGNOSIS — E6609 Other obesity due to excess calories: Secondary | ICD-10-CM | POA: Diagnosis not present

## 2018-12-15 DIAGNOSIS — N186 End stage renal disease: Secondary | ICD-10-CM | POA: Diagnosis not present

## 2018-12-16 DIAGNOSIS — N186 End stage renal disease: Secondary | ICD-10-CM | POA: Diagnosis not present

## 2018-12-16 DIAGNOSIS — D649 Anemia, unspecified: Secondary | ICD-10-CM | POA: Diagnosis not present

## 2018-12-16 DIAGNOSIS — E118 Type 2 diabetes mellitus with unspecified complications: Secondary | ICD-10-CM | POA: Diagnosis not present

## 2018-12-16 DIAGNOSIS — D509 Iron deficiency anemia, unspecified: Secondary | ICD-10-CM | POA: Diagnosis not present

## 2018-12-16 DIAGNOSIS — N2581 Secondary hyperparathyroidism of renal origin: Secondary | ICD-10-CM | POA: Diagnosis not present

## 2018-12-19 DIAGNOSIS — Z992 Dependence on renal dialysis: Secondary | ICD-10-CM | POA: Diagnosis not present

## 2018-12-19 DIAGNOSIS — I129 Hypertensive chronic kidney disease with stage 1 through stage 4 chronic kidney disease, or unspecified chronic kidney disease: Secondary | ICD-10-CM | POA: Diagnosis not present

## 2018-12-19 DIAGNOSIS — N186 End stage renal disease: Secondary | ICD-10-CM | POA: Diagnosis not present

## 2018-12-21 DIAGNOSIS — N2581 Secondary hyperparathyroidism of renal origin: Secondary | ICD-10-CM | POA: Diagnosis not present

## 2018-12-21 DIAGNOSIS — D509 Iron deficiency anemia, unspecified: Secondary | ICD-10-CM | POA: Diagnosis not present

## 2018-12-21 DIAGNOSIS — N186 End stage renal disease: Secondary | ICD-10-CM | POA: Diagnosis not present

## 2018-12-21 DIAGNOSIS — E118 Type 2 diabetes mellitus with unspecified complications: Secondary | ICD-10-CM | POA: Diagnosis not present

## 2018-12-22 ENCOUNTER — Encounter: Payer: Self-pay | Admitting: Physician Assistant

## 2018-12-22 ENCOUNTER — Ambulatory Visit (INDEPENDENT_AMBULATORY_CARE_PROVIDER_SITE_OTHER): Payer: Medicare Other | Admitting: Physician Assistant

## 2018-12-22 VITALS — BP 200/124 | HR 79 | Temp 98.4°F | Wt 250.0 lb

## 2018-12-22 DIAGNOSIS — I119 Hypertensive heart disease without heart failure: Secondary | ICD-10-CM

## 2018-12-22 DIAGNOSIS — Z992 Dependence on renal dialysis: Secondary | ICD-10-CM | POA: Diagnosis not present

## 2018-12-22 DIAGNOSIS — F419 Anxiety disorder, unspecified: Secondary | ICD-10-CM | POA: Diagnosis not present

## 2018-12-22 DIAGNOSIS — N186 End stage renal disease: Secondary | ICD-10-CM | POA: Diagnosis not present

## 2018-12-22 DIAGNOSIS — I10 Essential (primary) hypertension: Secondary | ICD-10-CM | POA: Diagnosis not present

## 2018-12-22 NOTE — Progress Notes (Signed)
Patient: Alan Gross Male    DOB: [birth year]   33 y.o.   MRN: [id number] Visit Date: 12/31/2018  Today's Provider: Trinna Post, PA-C   Chief Complaint  Patient presents with  . Follow-up   Subjective:     HPI   Follow up for Depression and Anxiety  The patient was last seen for this 1 months ago. Accompanied by wife and two children today Patient was prescribed Ativan from recent ER visit that he never picked up. Pt picked up Zoloft. He took zoloft for a single dose and reported it made him drowsy. He decided that he didn't want to take it anymore because he was afraid to take it due to things he had heard from other people taking He reports fair compliance with treatment. He feels that condition is Unchanged. He is not having side effects. He is no longer taking the medication.   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 Corliss Gross at dialysis. She monitors his blood pressure and changes his blood pressure mediations.   Wt Readings from Last 3 Encounters:  12/22/18 250 lb (113.4 kg)  12/08/18 258 lb 6.4 oz (117.2 kg)  11/19/18 251 lb (113.9 kg)   Angina at rest: Most recently has seen Dr. Clayborn Gross with cardiology. He has most recently seen Dr. Clayborn Gross on 12/15/2018 and BP at that time was 142/88. He underwent Holter monitoring which is pending further assessment. His echocardiogram on 12/11/2018 showed LVEF at 45% with severe left ventricular hypertrophy. LVEF on 2018 echo was 55-60% but LVH has been largely stable. Myocardial perfusion test again showed LVH but no evidence of ischemia. He was started on 25 mg metoprolol succinate which patient took once and said he felt very dizzy. Says he cut this in half but did not crush or chew it.  ------------------------------------------------------------------------------------   Allergies  Allergen Reactions  . Bee Venom Anaphylaxis  . Lasix [Furosemide] Shortness  Of Breath, Swelling and Anxiety     Current Outpatient Medications:  .  amLODipine (NORVASC) 10 MG tablet, Take 1 tablet (10 mg total) by mouth every evening., Disp: 30 tablet, Rfl: 1 .  losartan (COZAAR) 100 MG tablet, Take 1 tablet (100 mg total) by mouth every evening., Disp: 30 tablet, Rfl: 1 .  minoxidil (LONITEN) 10 MG tablet, Take 10 mg by mouth 2 (two) times daily with a meal. , Disp: , Rfl: 3 .  sucroferric oxyhydroxide (VELPHORO) 500 MG chewable tablet, Chew 500 mg by mouth 4 (four) times daily., Disp: , Rfl:  .  aspirin 81 MG tablet, Take 1 tablet (81 mg total) by mouth daily. (Patient not taking: Reported on 01/13/2018), Disp: 30 tablet, Rfl: 1 .  carvedilol (COREG) 25 MG tablet, Take 1 tablet (25 mg total) by mouth 2 (two) times daily with a meal. (Patient not taking: Reported on 01/13/2018), Disp: 60 tablet, Rfl: 1 .  cloNIDine (CATAPRES) 0.2 MG tablet, Take 1 tablet (0.2 mg total) by mouth 3 (three) times daily. (Patient not taking: Reported on 03/29/2018), Disp: 90 tablet, Rfl: 2 .  hydrALAZINE (APRESOLINE) 50 MG tablet, Take 1 tablet (50 mg total) by mouth 2 (two) times daily. (Patient not taking: Reported on 03/29/2018), Disp: 60 tablet, Rfl: 1 .  LORazepam (ATIVAN) 1 MG tablet, Take 1 tablet (1 mg total) by mouth 2 (two) times daily as needed for anxiety. (Patient not taking: Reported on 12/08/2018), Disp: 20 tablet, Rfl:  0 .  sertraline (ZOLOFT) 25 MG tablet, Take 1 tablet (25 mg total) by mouth daily. (Patient not taking: Reported on 12/22/2018), Disp: 90 tablet, Rfl: 0  Review of Systems  Psychiatric/Behavioral: The patient is nervous/anxious.     Social History   Tobacco Use  . Smoking status: Current Every Day Smoker    Packs/day: 0.25    Years: 10.00    Pack years: 2.50    Types: Cigarettes    Last attempt to quit: 06/22/2016    Years since quitting: 2.5  . Smokeless tobacco: Never Used  Substance Use Topics  . Alcohol use: Not Currently    Comment: Occasionally       Objective:   BP (!) 200/124 (BP Location: Right Arm, Patient Position: Sitting, Cuff Size: Normal)   Pulse 79   Temp 98.4 F (36.9 C) (Oral)   Wt 250 lb (113.4 kg)   SpO2 97%   BMI 32.10 kg/m  Vitals:   12/22/18 1614  BP: (!) 200/124  Pulse: 79  Temp: 98.4 F (36.9 C)  TempSrc: Oral  SpO2: 97%  Weight: 250 lb (113.4 kg)     Physical Exam Constitutional:      Appearance: Normal appearance.  Cardiovascular:     Rate and Rhythm: Normal rate and regular rhythm.     Heart sounds: Normal heart sounds.  Pulmonary:     Effort: Pulmonary effort is normal.     Breath sounds: Normal breath sounds.  Skin:    General: Skin is warm and dry.  Neurological:     Mental Status: He is alert and oriented to person, place, and time. Mental status is at baseline.  Psychiatric:        Mood and Affect: Mood normal.        Behavior: Behavior normal.         Assessment & Plan    1. ESRD (end stage renal disease) on dialysis [facility])  Patient was withdrawn from orientation at [facility] for transplantation because he did not have appropriate insurance as he only had Medicare part A&B and medicaid family planning. He reports he had been told by transplant clinic that he only had to get Medicare A&B and not part D, so he did not enroll for it in 2019. He also did not enroll for it in 2020 as well and just has Medicare A&B.   From chart review and from patient's own admission, Alan Gross has a very long history of noncompliance with treatment regimens. Until this point, he has not had a PCP. His HTN was unadressed for many years leading to his esrd.   I have called the dialysis clinic and had very long, in depth conversation with his Education officer, museum. Alan Gross frequently signs out AMA from hospital admissions, signs out early from dialysis. On his last two dialysis sessions he had 8 and 11 pounds of fluid removed. He is frequently noncompliant with his medications, citing that he overslept, or forgot, or  they make him feel strange and so he will stop taking them after one dose.   I have referred him to CCM for medication adherence. There is probably low likelihood that he would get approved for with his current level of noncompliance.   - Ambulatory referral to Chronic Care Management Services  2. Essential hypertension   - Ambulatory referral to Chronic Care Management Services  3. Hypertensive left ventricular hypertrophy, without heart failure   Followed by Dr. Clayborn Gross.   4. Anxiety  He should continue  zoloft 25 mg daily. I have explained that this is anxiety.   Return in about 3 months (around 03/22/2019) for anxiety .  The entirety of the information documented in the History of Present Illness, Review of Systems and Physical Exam were personally obtained by me. Portions of this information were initially documented by Lynford Humphrey, CMA and reviewed by me for thoroughness and accuracy.        Alan Post, PA-C  [facility]

## 2018-12-22 NOTE — Patient Instructions (Addendum)
Dr. Clayborn Bigness - please schedule follow up.  Split zoloft in half to take 12.5 mg daily

## 2018-12-23 DIAGNOSIS — E118 Type 2 diabetes mellitus with unspecified complications: Secondary | ICD-10-CM | POA: Diagnosis not present

## 2018-12-23 DIAGNOSIS — N186 End stage renal disease: Secondary | ICD-10-CM | POA: Diagnosis not present

## 2018-12-23 DIAGNOSIS — D509 Iron deficiency anemia, unspecified: Secondary | ICD-10-CM | POA: Diagnosis not present

## 2018-12-23 DIAGNOSIS — N2581 Secondary hyperparathyroidism of renal origin: Secondary | ICD-10-CM | POA: Diagnosis not present

## 2018-12-24 ENCOUNTER — Telehealth: Payer: Self-pay | Admitting: Gastroenterology

## 2018-12-24 NOTE — Telephone Encounter (Signed)
Pt called to ask about his procedure pt was informed we are waiting on Medical clearence and will receive a call once clearance has been received

## 2018-12-25 DIAGNOSIS — N186 End stage renal disease: Secondary | ICD-10-CM | POA: Diagnosis not present

## 2018-12-25 DIAGNOSIS — D509 Iron deficiency anemia, unspecified: Secondary | ICD-10-CM | POA: Diagnosis not present

## 2018-12-25 DIAGNOSIS — I119 Hypertensive heart disease without heart failure: Secondary | ICD-10-CM | POA: Insufficient documentation

## 2018-12-25 DIAGNOSIS — E118 Type 2 diabetes mellitus with unspecified complications: Secondary | ICD-10-CM | POA: Diagnosis not present

## 2018-12-25 DIAGNOSIS — N2581 Secondary hyperparathyroidism of renal origin: Secondary | ICD-10-CM | POA: Diagnosis not present

## 2018-12-28 DIAGNOSIS — D509 Iron deficiency anemia, unspecified: Secondary | ICD-10-CM | POA: Diagnosis not present

## 2018-12-28 DIAGNOSIS — N2581 Secondary hyperparathyroidism of renal origin: Secondary | ICD-10-CM | POA: Diagnosis not present

## 2018-12-28 DIAGNOSIS — N186 End stage renal disease: Secondary | ICD-10-CM | POA: Diagnosis not present

## 2018-12-28 DIAGNOSIS — E118 Type 2 diabetes mellitus with unspecified complications: Secondary | ICD-10-CM | POA: Diagnosis not present

## 2018-12-30 DIAGNOSIS — D509 Iron deficiency anemia, unspecified: Secondary | ICD-10-CM | POA: Diagnosis not present

## 2018-12-30 DIAGNOSIS — N2581 Secondary hyperparathyroidism of renal origin: Secondary | ICD-10-CM | POA: Diagnosis not present

## 2018-12-30 DIAGNOSIS — N186 End stage renal disease: Secondary | ICD-10-CM | POA: Diagnosis not present

## 2018-12-30 DIAGNOSIS — E118 Type 2 diabetes mellitus with unspecified complications: Secondary | ICD-10-CM | POA: Diagnosis not present

## 2018-12-31 ENCOUNTER — Encounter: Payer: Self-pay | Admitting: Gastroenterology

## 2018-12-31 ENCOUNTER — Ambulatory Visit: Payer: Medicare Other | Admitting: Gastroenterology

## 2019-01-01 ENCOUNTER — Ambulatory Visit: Payer: Self-pay | Admitting: Pharmacist

## 2019-01-01 DIAGNOSIS — Z992 Dependence on renal dialysis: Principal | ICD-10-CM

## 2019-01-01 DIAGNOSIS — N186 End stage renal disease: Secondary | ICD-10-CM

## 2019-01-01 DIAGNOSIS — I10 Essential (primary) hypertension: Secondary | ICD-10-CM

## 2019-01-01 NOTE — Patient Instructions (Signed)
Alan Gross was given information about Chronic Care Management services today including:  1. CCM service includes personalized support from designated clinical staff supervised by his physician, including individualized plan of care and coordination with other care providers 2. 24/7 contact phone numbers for assistance for urgent and routine care needs. 3. Service will only be billed when office clinical staff spend 20 minutes or more in a month to coordinate care. 4. Only one practitioner may furnish and bill the service in a calendar month. 5. The patient may stop CCM services at any time (effective at the end of the month) by phone call to the office staff. 6. The patient will be responsible for cost sharing (co-pay) of up to 20% of the service fee (after annual deductible is met).  Patient agreed to services and verbal consent obtained.

## 2019-01-01 NOTE — Chronic Care Management (AMB) (Signed)
  Chronic Care Management   Note  01/01/2019 Name: Alan Gross MRN: [id number] DOB: Nov 16, 1986  Alan Gross is a 32 y.o. year old male who sees Alan Gross, [place] for primary care. Alan Gross asked the CCM team to consult the patient for assistance with chronic disease management related to ESRD, history of non compliance, kidney transplant. Referral was placed 12/31/18. Telephone outreach to patient today to introduce CCM services.   Alan Gross agreed that CCM services would be helpful to them.   Plan: Patient agreed to services and verbal consent obtained. I have scheduled an appointment for Alan Gross on Wednesday, 01/13/19 at 3 PM following his dialysis.    Alan Gross was given information about Chronic Care Management services today including:  1. CCM service includes personalized support from designated clinical staff supervised by her physician, including individualized plan of care and coordination with other care providers 2. 24/7 contact phone numbers for assistance for urgent and routine care needs. 3. Service will only be billed when office clinical staff spend 20 minutes or more in a month to coordinate care. 4. Only one practitioner may furnish and bill the service in a calendar month. 5. The patient may stop CCM services at any time (effective at the end of the month) by phone call to the office staff. 6. The patient will be responsible for cost sharing (co-pay) of up to 20% of the service fee (after annual deductible is met).  Ruben Reason, PharmD Clinical Pharmacist [facility] [phone]

## 2019-01-04 DIAGNOSIS — N186 End stage renal disease: Secondary | ICD-10-CM | POA: Diagnosis not present

## 2019-01-04 DIAGNOSIS — D509 Iron deficiency anemia, unspecified: Secondary | ICD-10-CM | POA: Diagnosis not present

## 2019-01-04 DIAGNOSIS — N2581 Secondary hyperparathyroidism of renal origin: Secondary | ICD-10-CM | POA: Diagnosis not present

## 2019-01-04 DIAGNOSIS — E118 Type 2 diabetes mellitus with unspecified complications: Secondary | ICD-10-CM | POA: Diagnosis not present

## 2019-01-06 DIAGNOSIS — N186 End stage renal disease: Secondary | ICD-10-CM | POA: Diagnosis not present

## 2019-01-06 DIAGNOSIS — E118 Type 2 diabetes mellitus with unspecified complications: Secondary | ICD-10-CM | POA: Diagnosis not present

## 2019-01-06 DIAGNOSIS — N2581 Secondary hyperparathyroidism of renal origin: Secondary | ICD-10-CM | POA: Diagnosis not present

## 2019-01-06 DIAGNOSIS — D509 Iron deficiency anemia, unspecified: Secondary | ICD-10-CM | POA: Diagnosis not present

## 2019-01-08 DIAGNOSIS — N186 End stage renal disease: Secondary | ICD-10-CM | POA: Diagnosis not present

## 2019-01-08 DIAGNOSIS — N2581 Secondary hyperparathyroidism of renal origin: Secondary | ICD-10-CM | POA: Diagnosis not present

## 2019-01-08 DIAGNOSIS — E118 Type 2 diabetes mellitus with unspecified complications: Secondary | ICD-10-CM | POA: Diagnosis not present

## 2019-01-08 DIAGNOSIS — D509 Iron deficiency anemia, unspecified: Secondary | ICD-10-CM | POA: Diagnosis not present

## 2019-01-13 ENCOUNTER — Ambulatory Visit: Payer: Self-pay

## 2019-01-13 ENCOUNTER — Ambulatory Visit: Payer: Self-pay | Admitting: Pharmacist

## 2019-01-13 DIAGNOSIS — Z992 Dependence on renal dialysis: Principal | ICD-10-CM

## 2019-01-13 DIAGNOSIS — I10 Essential (primary) hypertension: Secondary | ICD-10-CM

## 2019-01-13 DIAGNOSIS — D509 Iron deficiency anemia, unspecified: Secondary | ICD-10-CM | POA: Diagnosis not present

## 2019-01-13 DIAGNOSIS — N2581 Secondary hyperparathyroidism of renal origin: Secondary | ICD-10-CM | POA: Diagnosis not present

## 2019-01-13 DIAGNOSIS — N186 End stage renal disease: Secondary | ICD-10-CM | POA: Diagnosis not present

## 2019-01-13 DIAGNOSIS — E118 Type 2 diabetes mellitus with unspecified complications: Secondary | ICD-10-CM | POA: Diagnosis not present

## 2019-01-13 NOTE — Chronic Care Management (AMB) (Signed)
  Chronic Care Management   Note  01/13/2019 Name: Alan Gross MRN: [id number] DOB: Jul 11, 1986   33 y.o. year old male referred to Chronic Care Management by Carles Collet- PA for ESRD, history of non compliance. Patient was no-show for scheduled CCM appointment this afternoon.   Was unable to reach patient via telephone today and have left HIPAA compliant voicemail asking patient to return my call. (unsuccessful outreach #1).  Ruben Reason, PharmD Clinical Pharmacist [place] [phone]

## 2019-01-15 ENCOUNTER — Ambulatory Visit: Payer: Self-pay | Admitting: Pharmacist

## 2019-01-15 DIAGNOSIS — N186 End stage renal disease: Secondary | ICD-10-CM

## 2019-01-15 DIAGNOSIS — Z992 Dependence on renal dialysis: Principal | ICD-10-CM

## 2019-01-15 DIAGNOSIS — I10 Essential (primary) hypertension: Secondary | ICD-10-CM

## 2019-01-15 NOTE — Chronic Care Management (AMB) (Signed)
  Chronic Care Management   Note  01/15/2019 Name: MAKAYLA CONFER MRN: [id number] DOB: 1986-07-09  33 y.o. year old male referred to Chronic Care Management by Carles Collet, PA  for ESRD and history of non compliance. Patient had consented to CCM and scheduled an initial office visit for 01/13/19, but was a no call, no show.   Was unable to reach patient via telephone today and have left HIPAA compliant voicemail asking patient to return my call. (unsuccessful outreach #2).  Ruben Reason, PharmD Clinical Pharmacist [place] [phone]

## 2019-01-17 DIAGNOSIS — I129 Hypertensive chronic kidney disease with stage 1 through stage 4 chronic kidney disease, or unspecified chronic kidney disease: Secondary | ICD-10-CM | POA: Diagnosis not present

## 2019-01-17 DIAGNOSIS — N186 End stage renal disease: Secondary | ICD-10-CM | POA: Diagnosis not present

## 2019-01-17 DIAGNOSIS — Z992 Dependence on renal dialysis: Secondary | ICD-10-CM | POA: Diagnosis not present

## 2019-01-18 DIAGNOSIS — D649 Anemia, unspecified: Secondary | ICD-10-CM | POA: Diagnosis not present

## 2019-01-18 DIAGNOSIS — D509 Iron deficiency anemia, unspecified: Secondary | ICD-10-CM | POA: Diagnosis not present

## 2019-01-18 DIAGNOSIS — N186 End stage renal disease: Secondary | ICD-10-CM | POA: Diagnosis not present

## 2019-01-18 DIAGNOSIS — E118 Type 2 diabetes mellitus with unspecified complications: Secondary | ICD-10-CM | POA: Diagnosis not present

## 2019-01-18 DIAGNOSIS — N2581 Secondary hyperparathyroidism of renal origin: Secondary | ICD-10-CM | POA: Diagnosis not present

## 2019-01-20 DIAGNOSIS — D509 Iron deficiency anemia, unspecified: Secondary | ICD-10-CM | POA: Diagnosis not present

## 2019-01-20 DIAGNOSIS — N2581 Secondary hyperparathyroidism of renal origin: Secondary | ICD-10-CM | POA: Diagnosis not present

## 2019-01-20 DIAGNOSIS — N186 End stage renal disease: Secondary | ICD-10-CM | POA: Diagnosis not present

## 2019-01-20 DIAGNOSIS — E118 Type 2 diabetes mellitus with unspecified complications: Secondary | ICD-10-CM | POA: Diagnosis not present

## 2019-01-20 DIAGNOSIS — D649 Anemia, unspecified: Secondary | ICD-10-CM | POA: Diagnosis not present

## 2019-01-22 ENCOUNTER — Ambulatory Visit: Payer: Self-pay | Admitting: Pharmacist

## 2019-01-22 DIAGNOSIS — Z992 Dependence on renal dialysis: Principal | ICD-10-CM

## 2019-01-22 DIAGNOSIS — I10 Essential (primary) hypertension: Secondary | ICD-10-CM

## 2019-01-22 DIAGNOSIS — N186 End stage renal disease: Secondary | ICD-10-CM

## 2019-01-22 NOTE — Chronic Care Management (AMB) (Signed)
  Chronic Care Management   Note  01/22/2019 Name: JVON MERONEY MRN: [id number] DOB: 1986-07-10  Successful outreach to Mr. [place] Ice today following no call/no show missed appointment for initial CCM office visit. HIPAA identifiers verified.  Mr. Blissett rescheduled his initial CCM office visit for Wednesday, 01/27/19 at 3pm following his dialysis.   Follow up plan: Face to Face appointment with CCM team member scheduled foR:  01/27/19  Ruben Reason, PharmD Clinical Pharmacist [place] [phone]

## 2019-01-25 DIAGNOSIS — N186 End stage renal disease: Secondary | ICD-10-CM | POA: Diagnosis not present

## 2019-01-25 DIAGNOSIS — E118 Type 2 diabetes mellitus with unspecified complications: Secondary | ICD-10-CM | POA: Diagnosis not present

## 2019-01-25 DIAGNOSIS — N2581 Secondary hyperparathyroidism of renal origin: Secondary | ICD-10-CM | POA: Diagnosis not present

## 2019-01-25 DIAGNOSIS — D649 Anemia, unspecified: Secondary | ICD-10-CM | POA: Diagnosis not present

## 2019-01-25 DIAGNOSIS — D509 Iron deficiency anemia, unspecified: Secondary | ICD-10-CM | POA: Diagnosis not present

## 2019-01-27 ENCOUNTER — Ambulatory Visit: Payer: Self-pay

## 2019-01-29 DIAGNOSIS — E118 Type 2 diabetes mellitus with unspecified complications: Secondary | ICD-10-CM | POA: Diagnosis not present

## 2019-01-29 DIAGNOSIS — N186 End stage renal disease: Secondary | ICD-10-CM | POA: Diagnosis not present

## 2019-01-29 DIAGNOSIS — N2581 Secondary hyperparathyroidism of renal origin: Secondary | ICD-10-CM | POA: Diagnosis not present

## 2019-01-29 DIAGNOSIS — D649 Anemia, unspecified: Secondary | ICD-10-CM | POA: Diagnosis not present

## 2019-01-29 DIAGNOSIS — D509 Iron deficiency anemia, unspecified: Secondary | ICD-10-CM | POA: Diagnosis not present

## 2019-02-01 DIAGNOSIS — E118 Type 2 diabetes mellitus with unspecified complications: Secondary | ICD-10-CM | POA: Diagnosis not present

## 2019-02-01 DIAGNOSIS — N186 End stage renal disease: Secondary | ICD-10-CM | POA: Diagnosis not present

## 2019-02-01 DIAGNOSIS — D509 Iron deficiency anemia, unspecified: Secondary | ICD-10-CM | POA: Diagnosis not present

## 2019-02-01 DIAGNOSIS — N2581 Secondary hyperparathyroidism of renal origin: Secondary | ICD-10-CM | POA: Diagnosis not present

## 2019-02-01 DIAGNOSIS — D649 Anemia, unspecified: Secondary | ICD-10-CM | POA: Diagnosis not present

## 2019-02-03 ENCOUNTER — Ambulatory Visit: Payer: Self-pay

## 2019-02-03 DIAGNOSIS — N2581 Secondary hyperparathyroidism of renal origin: Secondary | ICD-10-CM | POA: Diagnosis not present

## 2019-02-03 DIAGNOSIS — E118 Type 2 diabetes mellitus with unspecified complications: Secondary | ICD-10-CM | POA: Diagnosis not present

## 2019-02-03 DIAGNOSIS — N186 End stage renal disease: Secondary | ICD-10-CM | POA: Diagnosis not present

## 2019-02-03 DIAGNOSIS — D509 Iron deficiency anemia, unspecified: Secondary | ICD-10-CM | POA: Diagnosis not present

## 2019-02-03 DIAGNOSIS — D649 Anemia, unspecified: Secondary | ICD-10-CM | POA: Diagnosis not present

## 2019-02-05 DIAGNOSIS — N186 End stage renal disease: Secondary | ICD-10-CM | POA: Diagnosis not present

## 2019-02-05 DIAGNOSIS — N2581 Secondary hyperparathyroidism of renal origin: Secondary | ICD-10-CM | POA: Diagnosis not present

## 2019-02-05 DIAGNOSIS — D509 Iron deficiency anemia, unspecified: Secondary | ICD-10-CM | POA: Diagnosis not present

## 2019-02-05 DIAGNOSIS — E118 Type 2 diabetes mellitus with unspecified complications: Secondary | ICD-10-CM | POA: Diagnosis not present

## 2019-02-05 DIAGNOSIS — D649 Anemia, unspecified: Secondary | ICD-10-CM | POA: Diagnosis not present

## 2019-02-08 DIAGNOSIS — N186 End stage renal disease: Secondary | ICD-10-CM | POA: Diagnosis not present

## 2019-02-08 DIAGNOSIS — E118 Type 2 diabetes mellitus with unspecified complications: Secondary | ICD-10-CM | POA: Diagnosis not present

## 2019-02-08 DIAGNOSIS — N2581 Secondary hyperparathyroidism of renal origin: Secondary | ICD-10-CM | POA: Diagnosis not present

## 2019-02-08 DIAGNOSIS — D649 Anemia, unspecified: Secondary | ICD-10-CM | POA: Diagnosis not present

## 2019-02-08 DIAGNOSIS — D509 Iron deficiency anemia, unspecified: Secondary | ICD-10-CM | POA: Diagnosis not present

## 2019-02-10 DIAGNOSIS — D649 Anemia, unspecified: Secondary | ICD-10-CM | POA: Diagnosis not present

## 2019-02-10 DIAGNOSIS — E118 Type 2 diabetes mellitus with unspecified complications: Secondary | ICD-10-CM | POA: Diagnosis not present

## 2019-02-10 DIAGNOSIS — N186 End stage renal disease: Secondary | ICD-10-CM | POA: Diagnosis not present

## 2019-02-10 DIAGNOSIS — D509 Iron deficiency anemia, unspecified: Secondary | ICD-10-CM | POA: Diagnosis not present

## 2019-02-10 DIAGNOSIS — N2581 Secondary hyperparathyroidism of renal origin: Secondary | ICD-10-CM | POA: Diagnosis not present

## 2019-02-12 DIAGNOSIS — D509 Iron deficiency anemia, unspecified: Secondary | ICD-10-CM | POA: Diagnosis not present

## 2019-02-12 DIAGNOSIS — E118 Type 2 diabetes mellitus with unspecified complications: Secondary | ICD-10-CM | POA: Diagnosis not present

## 2019-02-12 DIAGNOSIS — D649 Anemia, unspecified: Secondary | ICD-10-CM | POA: Diagnosis not present

## 2019-02-12 DIAGNOSIS — N186 End stage renal disease: Secondary | ICD-10-CM | POA: Diagnosis not present

## 2019-02-12 DIAGNOSIS — N2581 Secondary hyperparathyroidism of renal origin: Secondary | ICD-10-CM | POA: Diagnosis not present

## 2019-02-15 DIAGNOSIS — E118 Type 2 diabetes mellitus with unspecified complications: Secondary | ICD-10-CM | POA: Diagnosis not present

## 2019-02-15 DIAGNOSIS — D509 Iron deficiency anemia, unspecified: Secondary | ICD-10-CM | POA: Diagnosis not present

## 2019-02-15 DIAGNOSIS — D649 Anemia, unspecified: Secondary | ICD-10-CM | POA: Diagnosis not present

## 2019-02-15 DIAGNOSIS — N2581 Secondary hyperparathyroidism of renal origin: Secondary | ICD-10-CM | POA: Diagnosis not present

## 2019-02-15 DIAGNOSIS — N186 End stage renal disease: Secondary | ICD-10-CM | POA: Diagnosis not present

## 2019-02-17 DIAGNOSIS — I129 Hypertensive chronic kidney disease with stage 1 through stage 4 chronic kidney disease, or unspecified chronic kidney disease: Secondary | ICD-10-CM | POA: Diagnosis not present

## 2019-02-17 DIAGNOSIS — N2581 Secondary hyperparathyroidism of renal origin: Secondary | ICD-10-CM | POA: Diagnosis not present

## 2019-02-17 DIAGNOSIS — E118 Type 2 diabetes mellitus with unspecified complications: Secondary | ICD-10-CM | POA: Diagnosis not present

## 2019-02-17 DIAGNOSIS — N186 End stage renal disease: Secondary | ICD-10-CM | POA: Diagnosis not present

## 2019-02-17 DIAGNOSIS — Z992 Dependence on renal dialysis: Secondary | ICD-10-CM | POA: Diagnosis not present

## 2019-02-17 DIAGNOSIS — E8770 Fluid overload, unspecified: Secondary | ICD-10-CM | POA: Diagnosis not present

## 2019-02-17 DIAGNOSIS — D649 Anemia, unspecified: Secondary | ICD-10-CM | POA: Diagnosis not present

## 2019-02-19 DIAGNOSIS — E8770 Fluid overload, unspecified: Secondary | ICD-10-CM | POA: Diagnosis not present

## 2019-02-19 DIAGNOSIS — D649 Anemia, unspecified: Secondary | ICD-10-CM | POA: Diagnosis not present

## 2019-02-19 DIAGNOSIS — E118 Type 2 diabetes mellitus with unspecified complications: Secondary | ICD-10-CM | POA: Diagnosis not present

## 2019-02-19 DIAGNOSIS — N186 End stage renal disease: Secondary | ICD-10-CM | POA: Diagnosis not present

## 2019-02-19 DIAGNOSIS — N2581 Secondary hyperparathyroidism of renal origin: Secondary | ICD-10-CM | POA: Diagnosis not present

## 2019-02-22 ENCOUNTER — Ambulatory Visit: Payer: Self-pay | Admitting: Pharmacist

## 2019-02-22 DIAGNOSIS — Z992 Dependence on renal dialysis: Principal | ICD-10-CM

## 2019-02-22 DIAGNOSIS — D649 Anemia, unspecified: Secondary | ICD-10-CM | POA: Diagnosis not present

## 2019-02-22 DIAGNOSIS — N186 End stage renal disease: Secondary | ICD-10-CM

## 2019-02-22 DIAGNOSIS — N2581 Secondary hyperparathyroidism of renal origin: Secondary | ICD-10-CM | POA: Diagnosis not present

## 2019-02-22 DIAGNOSIS — E8770 Fluid overload, unspecified: Secondary | ICD-10-CM | POA: Diagnosis not present

## 2019-02-22 DIAGNOSIS — E118 Type 2 diabetes mellitus with unspecified complications: Secondary | ICD-10-CM | POA: Diagnosis not present

## 2019-02-22 DIAGNOSIS — I10 Essential (primary) hypertension: Secondary | ICD-10-CM

## 2019-02-22 NOTE — Chronic Care Management (AMB) (Signed)
  Care Management   Note  02/22/2019 Name: Alan Gross MRN: [id number] DOB: 1986/04/05  Alan Gross is a 33 y.o. year old male who sees Trinna Post, [place] for primary care. Carles Collet asked the CCM team to consult the patient for assistance with disease management related to ESRD and uncontrolled HTN Referral was placed 12/31/18.   Patient has no call/no show 3 consecutive scheduled initial care management appointments.  Plan: Care management will close this referral at this time. Patient has been provided contact information and care management team is happy to assist patient in the future with active patient engagement.   Ruben Reason, PharmD Clinical Pharmacist [place] [phone]

## 2019-02-24 DIAGNOSIS — E118 Type 2 diabetes mellitus with unspecified complications: Secondary | ICD-10-CM | POA: Diagnosis not present

## 2019-02-24 DIAGNOSIS — D649 Anemia, unspecified: Secondary | ICD-10-CM | POA: Diagnosis not present

## 2019-02-24 DIAGNOSIS — E8770 Fluid overload, unspecified: Secondary | ICD-10-CM | POA: Diagnosis not present

## 2019-02-24 DIAGNOSIS — N2581 Secondary hyperparathyroidism of renal origin: Secondary | ICD-10-CM | POA: Diagnosis not present

## 2019-02-24 DIAGNOSIS — N186 End stage renal disease: Secondary | ICD-10-CM | POA: Diagnosis not present

## 2019-02-27 IMAGING — CR DG CHEST 2V
2 series · 2 of 2 positions shown · non-contrast
Comparison: 03/29/2018 and prior radiograph

CLINICAL DATA: Acute chest pain.

EXAM:
CHEST - 2 VIEW

[chest pa]
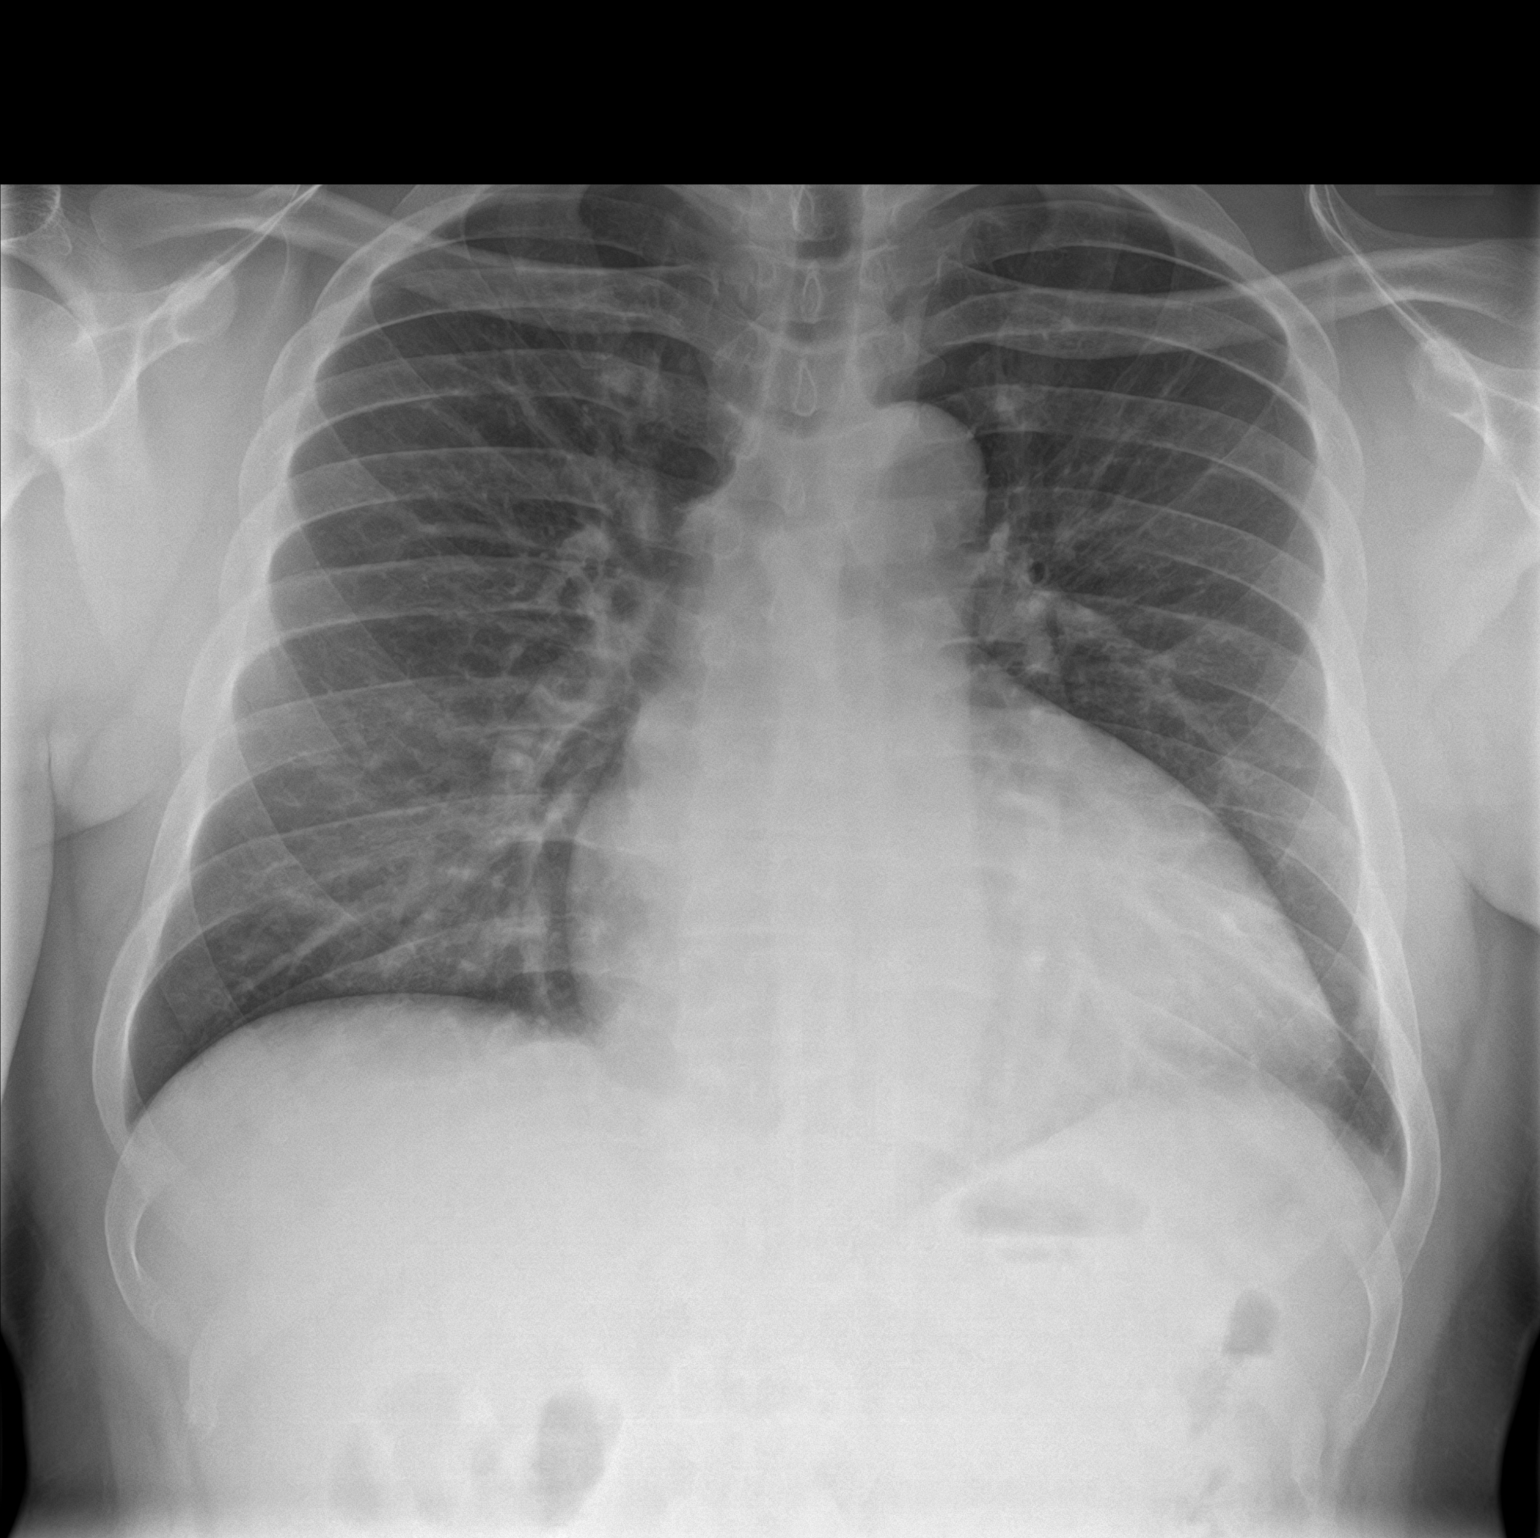

[chest lat]
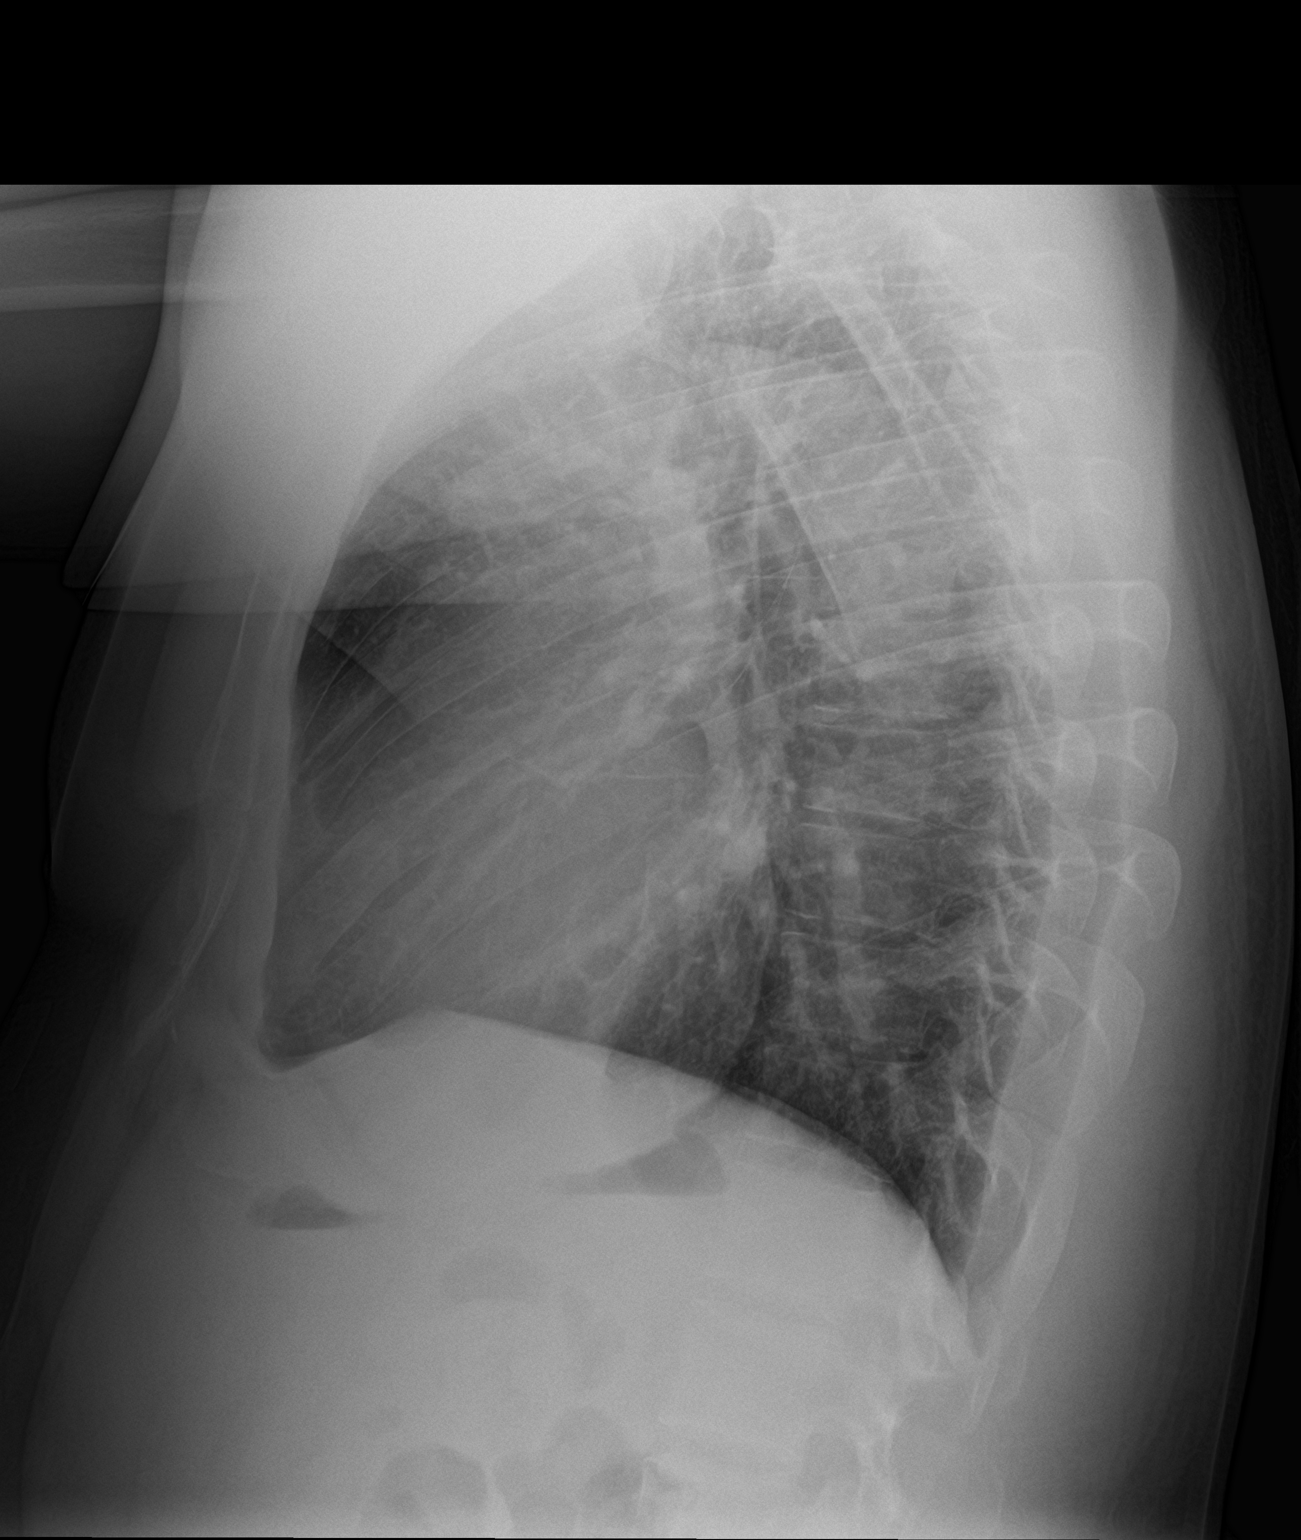

[2 of 2 positions shown; findings below may reference images not displayed]

FINDINGS: Cardiomegaly again noted.

There is no evidence of focal airspace disease, pulmonary edema,
suspicious pulmonary nodule/mass, pleural effusion, or pneumothorax.

No acute bony abnormalities are identified.
IMPRESSION: Cardiomegaly without evidence of acute cardiopulmonary disease.

## 2019-03-01 DIAGNOSIS — D649 Anemia, unspecified: Secondary | ICD-10-CM | POA: Diagnosis not present

## 2019-03-01 DIAGNOSIS — N186 End stage renal disease: Secondary | ICD-10-CM | POA: Diagnosis not present

## 2019-03-01 DIAGNOSIS — E8770 Fluid overload, unspecified: Secondary | ICD-10-CM | POA: Diagnosis not present

## 2019-03-01 DIAGNOSIS — E118 Type 2 diabetes mellitus with unspecified complications: Secondary | ICD-10-CM | POA: Diagnosis not present

## 2019-03-01 DIAGNOSIS — N2581 Secondary hyperparathyroidism of renal origin: Secondary | ICD-10-CM | POA: Diagnosis not present

## 2019-03-03 DIAGNOSIS — E8770 Fluid overload, unspecified: Secondary | ICD-10-CM | POA: Diagnosis not present

## 2019-03-03 DIAGNOSIS — E118 Type 2 diabetes mellitus with unspecified complications: Secondary | ICD-10-CM | POA: Diagnosis not present

## 2019-03-03 DIAGNOSIS — N2581 Secondary hyperparathyroidism of renal origin: Secondary | ICD-10-CM | POA: Diagnosis not present

## 2019-03-03 DIAGNOSIS — D649 Anemia, unspecified: Secondary | ICD-10-CM | POA: Diagnosis not present

## 2019-03-03 DIAGNOSIS — N186 End stage renal disease: Secondary | ICD-10-CM | POA: Diagnosis not present

## 2019-03-06 DIAGNOSIS — E8770 Fluid overload, unspecified: Secondary | ICD-10-CM | POA: Diagnosis not present

## 2019-03-06 DIAGNOSIS — E118 Type 2 diabetes mellitus with unspecified complications: Secondary | ICD-10-CM | POA: Diagnosis not present

## 2019-03-06 DIAGNOSIS — N186 End stage renal disease: Secondary | ICD-10-CM | POA: Diagnosis not present

## 2019-03-06 DIAGNOSIS — D649 Anemia, unspecified: Secondary | ICD-10-CM | POA: Diagnosis not present

## 2019-03-06 DIAGNOSIS — N2581 Secondary hyperparathyroidism of renal origin: Secondary | ICD-10-CM | POA: Diagnosis not present

## 2019-03-08 DIAGNOSIS — D649 Anemia, unspecified: Secondary | ICD-10-CM | POA: Diagnosis not present

## 2019-03-08 DIAGNOSIS — N2581 Secondary hyperparathyroidism of renal origin: Secondary | ICD-10-CM | POA: Diagnosis not present

## 2019-03-08 DIAGNOSIS — E8770 Fluid overload, unspecified: Secondary | ICD-10-CM | POA: Diagnosis not present

## 2019-03-08 DIAGNOSIS — E118 Type 2 diabetes mellitus with unspecified complications: Secondary | ICD-10-CM | POA: Diagnosis not present

## 2019-03-08 DIAGNOSIS — N186 End stage renal disease: Secondary | ICD-10-CM | POA: Diagnosis not present

## 2019-03-10 DIAGNOSIS — E8770 Fluid overload, unspecified: Secondary | ICD-10-CM | POA: Diagnosis not present

## 2019-03-10 DIAGNOSIS — N186 End stage renal disease: Secondary | ICD-10-CM | POA: Diagnosis not present

## 2019-03-10 DIAGNOSIS — E118 Type 2 diabetes mellitus with unspecified complications: Secondary | ICD-10-CM | POA: Diagnosis not present

## 2019-03-10 DIAGNOSIS — N2581 Secondary hyperparathyroidism of renal origin: Secondary | ICD-10-CM | POA: Diagnosis not present

## 2019-03-10 DIAGNOSIS — D649 Anemia, unspecified: Secondary | ICD-10-CM | POA: Diagnosis not present

## 2019-03-12 DIAGNOSIS — E118 Type 2 diabetes mellitus with unspecified complications: Secondary | ICD-10-CM | POA: Diagnosis not present

## 2019-03-12 DIAGNOSIS — N186 End stage renal disease: Secondary | ICD-10-CM | POA: Diagnosis not present

## 2019-03-12 DIAGNOSIS — N2581 Secondary hyperparathyroidism of renal origin: Secondary | ICD-10-CM | POA: Diagnosis not present

## 2019-03-12 DIAGNOSIS — E8770 Fluid overload, unspecified: Secondary | ICD-10-CM | POA: Diagnosis not present

## 2019-03-12 DIAGNOSIS — D649 Anemia, unspecified: Secondary | ICD-10-CM | POA: Diagnosis not present

## 2019-03-15 DIAGNOSIS — E118 Type 2 diabetes mellitus with unspecified complications: Secondary | ICD-10-CM | POA: Diagnosis not present

## 2019-03-15 DIAGNOSIS — E8770 Fluid overload, unspecified: Secondary | ICD-10-CM | POA: Diagnosis not present

## 2019-03-15 DIAGNOSIS — D649 Anemia, unspecified: Secondary | ICD-10-CM | POA: Diagnosis not present

## 2019-03-15 DIAGNOSIS — N186 End stage renal disease: Secondary | ICD-10-CM | POA: Diagnosis not present

## 2019-03-15 DIAGNOSIS — N2581 Secondary hyperparathyroidism of renal origin: Secondary | ICD-10-CM | POA: Diagnosis not present

## 2019-03-17 DIAGNOSIS — N186 End stage renal disease: Secondary | ICD-10-CM | POA: Diagnosis not present

## 2019-03-17 DIAGNOSIS — E118 Type 2 diabetes mellitus with unspecified complications: Secondary | ICD-10-CM | POA: Diagnosis not present

## 2019-03-17 DIAGNOSIS — D649 Anemia, unspecified: Secondary | ICD-10-CM | POA: Diagnosis not present

## 2019-03-17 DIAGNOSIS — N2581 Secondary hyperparathyroidism of renal origin: Secondary | ICD-10-CM | POA: Diagnosis not present

## 2019-03-17 DIAGNOSIS — E8770 Fluid overload, unspecified: Secondary | ICD-10-CM | POA: Diagnosis not present

## 2019-03-18 DIAGNOSIS — D649 Anemia, unspecified: Secondary | ICD-10-CM | POA: Diagnosis not present

## 2019-03-18 DIAGNOSIS — E8770 Fluid overload, unspecified: Secondary | ICD-10-CM | POA: Diagnosis not present

## 2019-03-18 DIAGNOSIS — E118 Type 2 diabetes mellitus with unspecified complications: Secondary | ICD-10-CM | POA: Diagnosis not present

## 2019-03-18 DIAGNOSIS — N186 End stage renal disease: Secondary | ICD-10-CM | POA: Diagnosis not present

## 2019-03-18 DIAGNOSIS — N2581 Secondary hyperparathyroidism of renal origin: Secondary | ICD-10-CM | POA: Diagnosis not present

## 2019-03-19 DIAGNOSIS — E118 Type 2 diabetes mellitus with unspecified complications: Secondary | ICD-10-CM | POA: Diagnosis not present

## 2019-03-19 DIAGNOSIS — Z992 Dependence on renal dialysis: Secondary | ICD-10-CM | POA: Diagnosis not present

## 2019-03-19 DIAGNOSIS — I129 Hypertensive chronic kidney disease with stage 1 through stage 4 chronic kidney disease, or unspecified chronic kidney disease: Secondary | ICD-10-CM | POA: Diagnosis not present

## 2019-03-19 DIAGNOSIS — N2581 Secondary hyperparathyroidism of renal origin: Secondary | ICD-10-CM | POA: Diagnosis not present

## 2019-03-19 DIAGNOSIS — N186 End stage renal disease: Secondary | ICD-10-CM | POA: Diagnosis not present

## 2019-03-22 DIAGNOSIS — N2581 Secondary hyperparathyroidism of renal origin: Secondary | ICD-10-CM | POA: Diagnosis not present

## 2019-03-22 DIAGNOSIS — N186 End stage renal disease: Secondary | ICD-10-CM | POA: Diagnosis not present

## 2019-03-22 DIAGNOSIS — E118 Type 2 diabetes mellitus with unspecified complications: Secondary | ICD-10-CM | POA: Diagnosis not present

## 2019-03-23 ENCOUNTER — Ambulatory Visit (INDEPENDENT_AMBULATORY_CARE_PROVIDER_SITE_OTHER): Payer: Medicare Other | Admitting: Physician Assistant

## 2019-03-23 ENCOUNTER — Encounter: Payer: Self-pay | Admitting: Physician Assistant

## 2019-03-23 DIAGNOSIS — Z992 Dependence on renal dialysis: Secondary | ICD-10-CM

## 2019-03-23 DIAGNOSIS — K219 Gastro-esophageal reflux disease without esophagitis: Secondary | ICD-10-CM | POA: Diagnosis not present

## 2019-03-23 DIAGNOSIS — F419 Anxiety disorder, unspecified: Secondary | ICD-10-CM | POA: Diagnosis not present

## 2019-03-23 DIAGNOSIS — N186 End stage renal disease: Secondary | ICD-10-CM

## 2019-03-23 DIAGNOSIS — R0602 Shortness of breath: Secondary | ICD-10-CM | POA: Diagnosis not present

## 2019-03-23 DIAGNOSIS — R002 Palpitations: Secondary | ICD-10-CM | POA: Diagnosis not present

## 2019-03-23 DIAGNOSIS — I208 Other forms of angina pectoris: Secondary | ICD-10-CM | POA: Diagnosis not present

## 2019-03-23 DIAGNOSIS — I10 Essential (primary) hypertension: Secondary | ICD-10-CM

## 2019-03-23 DIAGNOSIS — R1110 Vomiting, unspecified: Secondary | ICD-10-CM

## 2019-03-23 DIAGNOSIS — R000 Tachycardia, unspecified: Secondary | ICD-10-CM | POA: Diagnosis not present

## 2019-03-23 DIAGNOSIS — I429 Cardiomyopathy, unspecified: Secondary | ICD-10-CM | POA: Diagnosis not present

## 2019-03-23 DIAGNOSIS — E6609 Other obesity due to excess calories: Secondary | ICD-10-CM | POA: Diagnosis not present

## 2019-03-23 DIAGNOSIS — F17200 Nicotine dependence, unspecified, uncomplicated: Secondary | ICD-10-CM | POA: Diagnosis not present

## 2019-03-23 DIAGNOSIS — Z6833 Body mass index (BMI) 33.0-33.9, adult: Secondary | ICD-10-CM | POA: Diagnosis not present

## 2019-03-23 MED ORDER — SERTRALINE HCL 25 MG PO TABS: 25.0000 mg | ORAL_TABLET | Freq: Every day | ORAL | 0 refills | Status: AC

## 2019-03-23 NOTE — Progress Notes (Signed)
Patient: Alan Gross Male    DOB: 14-Jul-1986   33 y.o.   MRN: [id number] Visit Date: 03/23/2019  Today's Provider: Trinna Post, PA-C   Chief Complaint  Patient presents with  . Anxiety  . Depression   Subjective:    Virtual Visit via Video Note  I connected with Alan Gross on 03/23/19 at  1:40 PM EDT by a video enabled telemedicine application and verified that I am speaking with the correct person using two identifiers.  Location: Patient: Home Provider: Office   I discussed the limitations of evaluation and management by telemedicine and the availability of in person appointments. The patient expressed understanding and agreed to proceed.  HPI  Follow up for Depression and Anxiety:  The patient was last seen for this 3 months ago.  Patient advised to continue Zoloft 25 mg. Reports that after taking it for one week it started to help him. Reports he is having improvement in mood. When he missed it, he notices a difference and his family does as well.  He reports good compliance with treatment. He feels that condition is improved. He is not having side effects. Patient reports his symptoms are improved, but on dialysis days he feels symptoms are unstable. Reports he has cramping and goes to a bad place when he has dialysis.   Depression screen [facility] 2/9 11/19/2018 07/19/2016  Decreased Interest 0 0  Down, Depressed, Hopeless 1 0  PHQ - 2 Score 1 0  Altered sleeping 1 0  Tired, decreased energy 2 0  Change in appetite 0 0  Feeling bad or failure about yourself  1 0  Trouble concentrating 0 0  Moving slowly or fidgety/restless 0 1  Suicidal thoughts 0 0  PHQ-9 Score 5 1  Difficult doing work/chores Not difficult at all Not difficult at all    GAD 7 : Generalized Anxiety Score 07/19/2016  Nervous, Anxious, on Edge 1  Control/stop worrying 1  Worry too much - different things 1  Trouble relaxing 2  Restless 1  Easily annoyed or irritable 0  Afraid -  awful might happen 0  Total GAD 7 Score 6  Anxiety Difficulty Not difficult at all   HTN: He saw Dr. Clayborn Bigness in cardiology today where his BP was 144/80. He reports Dr. Clayborn Bigness gave him flexeril for his muscle spasms during dialysis. He continues on norvasc 10 mg daily, losartan 100 mg daily, metoprolol succinate XL 25 mg daily, and minoxidil 10 mg BID.   ESRD on Dialysis: He reports he continues dialysis. He has ESRD from uncontrolled HTN. There have been many attempts to coordinate his care with transplant hospitals but these have failed to proceed due to insurance problems, per patient, and also failure to follow up per chart review. He was referred to our chronic care management team for purpose of disease education and help establishing relationship with transplant teams. Referral has been closed due to multiple unsuccessful attempts to contact patient.   Vomiting: Patient reports he is still vomiting after meals  He was referred to GI for this issue and saw Dr. Bonna Gains at [facility] on 12/08/2018. EGD and colonoscopy were planned pending his cardiology work up and he was instructed to follow up in 3-4 weeks.  EGD and colonoscopy planned, patient no-showed follow up visit on 12/31/2018. He is asking what he should do about this today.   Wt Readings from Last 3 Encounters:  12/22/18 250 lb (113.4 kg)  12/08/18 258 lb 6.4 oz (117.2 kg)  11/19/18 251 lb (113.9 kg)     Allergies  Allergen Reactions  . Bee Venom Anaphylaxis  . Lasix [Furosemide] Shortness Of Breath, Swelling and Anxiety     Current Outpatient Medications:  .  cyclobenzaprine (FLEXERIL) 10 MG tablet, Take by mouth., Disp: , Rfl:  .  diphenhydrAMINE (BENADRYL) 25 mg capsule, Take by mouth., Disp: , Rfl:  .  amLODipine (NORVASC) 10 MG tablet, Take 1 tablet (10 mg total) by mouth every evening., Disp: 30 tablet, Rfl: 1 .  aspirin 81 MG tablet, Take 1 tablet (81 mg total) by mouth daily. (Patient not taking: Reported on  01/13/2018), Disp: 30 tablet, Rfl: 1 .  carvedilol (COREG) 25 MG tablet, Take 1 tablet (25 mg total) by mouth 2 (two) times daily with a meal. (Patient not taking: Reported on 01/13/2018), Disp: 60 tablet, Rfl: 1 .  cloNIDine (CATAPRES) 0.2 MG tablet, Take 1 tablet (0.2 mg total) by mouth 3 (three) times daily. (Patient not taking: Reported on 03/29/2018), Disp: 90 tablet, Rfl: 2 .  hydrALAZINE (APRESOLINE) 50 MG tablet, Take 1 tablet (50 mg total) by mouth 2 (two) times daily. (Patient not taking: Reported on 03/29/2018), Disp: 60 tablet, Rfl: 1 .  LORazepam (ATIVAN) 1 MG tablet, Take 1 tablet (1 mg total) by mouth 2 (two) times daily as needed for anxiety. (Patient not taking: Reported on 12/08/2018), Disp: 20 tablet, Rfl: 0 .  losartan (COZAAR) 100 MG tablet, Take 1 tablet (100 mg total) by mouth every evening., Disp: 30 tablet, Rfl: 1 .  metoprolol succinate (TOPROL-XL) 25 MG 24 hr tablet, Take 25 mg by mouth daily., Disp: , Rfl:  .  minoxidil (LONITEN) 10 MG tablet, Take 10 mg by mouth 2 (two) times daily with a meal. , Disp: , Rfl: 3 .  sertraline (ZOLOFT) 25 MG tablet, Take 1 tablet (25 mg total) by mouth daily. (Patient not taking: Reported on 12/22/2018), Disp: 90 tablet, Rfl: 0 .  sucroferric oxyhydroxide (VELPHORO) 500 MG chewable tablet, Chew 500 mg by mouth 4 (four) times daily., Disp: , Rfl:   Review of Systems  Constitutional: Negative for appetite change, chills and fever.  Respiratory: Negative for chest tightness, shortness of breath and wheezing.   Cardiovascular: Negative for chest pain and palpitations.  Gastrointestinal: Negative for abdominal pain, nausea and vomiting.  Psychiatric/Behavioral: The patient is nervous/anxious.        Depression     Social History   Tobacco Use  . Smoking status: Current Every Day Smoker    Packs/day: 0.25    Years: 10.00    Pack years: 2.50    Types: Cigarettes    Last attempt to quit: 06/22/2016    Years since quitting: 2.7  . Smokeless  tobacco: Never Used  Substance Use Topics  . Alcohol use: Not Currently    Comment: Occasionally      Objective:   There were no vitals taken for this visit. There were no vitals filed for this visit.   Physical Exam Constitutional:      General: He is not in acute distress.    Appearance: Normal appearance. He is not ill-appearing.  Neurological:     Mental Status: He is alert.  Psychiatric:        Mood and Affect: Mood normal.        Behavior: Behavior normal.         Assessment & Plan    1. Anxiety  He is  improved on zoloft 25 mg daily and does not wish to increase the dose. F/u 6 months.   - sertraline (ZOLOFT) 25 MG tablet; Take 1 tablet (25 mg total) by mouth daily.  Dispense: 90 tablet; Refill: 0  2. Essential hypertension  Better controlled based on office visit today with Dr. Clayborn Bigness.   3. ESRD (end stage renal disease) on dialysis [facility])  Referral to CCM had been closed due to inability to contact him. Patient reports he is still interested in these services. I have provided him the contact number for the CCM team and instructed him to call as the referral has been closed and no more attempts will be made from our office.   4. Vomiting, intractability of vomiting not specified, presence of nausea not specified, unspecified vomiting type  I have instructed him to contact his GI doctor regarding this. This is a chronic issue. Unfortunately, patient did not follow up as instructed and I have advised him procedures may be delayed due to COVID-19. He expresses understanding and says he will contact the GI doctor.    I discussed the assessment and treatment plan with the patient. The patient was provided an opportunity to ask questions and all were answered. The patient agreed with the plan and demonstrated an understanding of the instructions.   The patient was advised to call back or seek an in-person evaluation if the symptoms worsen or if the condition fails to  improve as anticipated.  I provided 25 minutes of non-face-to-face time during this encounter.  The entirety of the information documented in the History of Present Illness, Review of Systems and Physical Exam were personally obtained by me. Portions of this information were initially documented by Idelle Jo, CMA and reviewed by me for thoroughness and accuracy.         Trinna Post, PA-C  [facility]

## 2019-03-24 ENCOUNTER — Ambulatory Visit: Payer: Self-pay | Admitting: Physician Assistant

## 2019-03-24 DIAGNOSIS — N186 End stage renal disease: Secondary | ICD-10-CM | POA: Diagnosis not present

## 2019-03-24 DIAGNOSIS — N2581 Secondary hyperparathyroidism of renal origin: Secondary | ICD-10-CM | POA: Diagnosis not present

## 2019-03-24 DIAGNOSIS — E118 Type 2 diabetes mellitus with unspecified complications: Secondary | ICD-10-CM | POA: Diagnosis not present

## 2019-03-24 NOTE — Patient Instructions (Signed)

## 2019-03-26 DIAGNOSIS — N2581 Secondary hyperparathyroidism of renal origin: Secondary | ICD-10-CM | POA: Diagnosis not present

## 2019-03-26 DIAGNOSIS — N186 End stage renal disease: Secondary | ICD-10-CM | POA: Diagnosis not present

## 2019-03-26 DIAGNOSIS — E118 Type 2 diabetes mellitus with unspecified complications: Secondary | ICD-10-CM | POA: Diagnosis not present

## 2019-03-29 DIAGNOSIS — N186 End stage renal disease: Secondary | ICD-10-CM | POA: Diagnosis not present

## 2019-03-29 DIAGNOSIS — E118 Type 2 diabetes mellitus with unspecified complications: Secondary | ICD-10-CM | POA: Diagnosis not present

## 2019-03-29 DIAGNOSIS — N2581 Secondary hyperparathyroidism of renal origin: Secondary | ICD-10-CM | POA: Diagnosis not present

## 2019-04-02 DIAGNOSIS — N2581 Secondary hyperparathyroidism of renal origin: Secondary | ICD-10-CM | POA: Diagnosis not present

## 2019-04-02 DIAGNOSIS — N186 End stage renal disease: Secondary | ICD-10-CM | POA: Diagnosis not present

## 2019-04-02 DIAGNOSIS — E118 Type 2 diabetes mellitus with unspecified complications: Secondary | ICD-10-CM | POA: Diagnosis not present

## 2019-04-06 DIAGNOSIS — N2581 Secondary hyperparathyroidism of renal origin: Secondary | ICD-10-CM | POA: Diagnosis not present

## 2019-04-06 DIAGNOSIS — N186 End stage renal disease: Secondary | ICD-10-CM | POA: Diagnosis not present

## 2019-04-06 DIAGNOSIS — E118 Type 2 diabetes mellitus with unspecified complications: Secondary | ICD-10-CM | POA: Diagnosis not present

## 2019-04-07 DIAGNOSIS — N186 End stage renal disease: Secondary | ICD-10-CM | POA: Diagnosis not present

## 2019-04-07 DIAGNOSIS — N2581 Secondary hyperparathyroidism of renal origin: Secondary | ICD-10-CM | POA: Diagnosis not present

## 2019-04-07 DIAGNOSIS — E118 Type 2 diabetes mellitus with unspecified complications: Secondary | ICD-10-CM | POA: Diagnosis not present

## 2019-04-09 DIAGNOSIS — E118 Type 2 diabetes mellitus with unspecified complications: Secondary | ICD-10-CM | POA: Diagnosis not present

## 2019-04-09 DIAGNOSIS — N2581 Secondary hyperparathyroidism of renal origin: Secondary | ICD-10-CM | POA: Diagnosis not present

## 2019-04-09 DIAGNOSIS — N186 End stage renal disease: Secondary | ICD-10-CM | POA: Diagnosis not present

## 2019-04-12 DIAGNOSIS — E118 Type 2 diabetes mellitus with unspecified complications: Secondary | ICD-10-CM | POA: Diagnosis not present

## 2019-04-12 DIAGNOSIS — N2581 Secondary hyperparathyroidism of renal origin: Secondary | ICD-10-CM | POA: Diagnosis not present

## 2019-04-12 DIAGNOSIS — N186 End stage renal disease: Secondary | ICD-10-CM | POA: Diagnosis not present

## 2019-04-14 DIAGNOSIS — E118 Type 2 diabetes mellitus with unspecified complications: Secondary | ICD-10-CM | POA: Diagnosis not present

## 2019-04-14 DIAGNOSIS — N186 End stage renal disease: Secondary | ICD-10-CM | POA: Diagnosis not present

## 2019-04-14 DIAGNOSIS — N2581 Secondary hyperparathyroidism of renal origin: Secondary | ICD-10-CM | POA: Diagnosis not present

## 2019-04-16 DIAGNOSIS — N2581 Secondary hyperparathyroidism of renal origin: Secondary | ICD-10-CM | POA: Diagnosis not present

## 2019-04-16 DIAGNOSIS — N186 End stage renal disease: Secondary | ICD-10-CM | POA: Diagnosis not present

## 2019-04-16 DIAGNOSIS — E118 Type 2 diabetes mellitus with unspecified complications: Secondary | ICD-10-CM | POA: Diagnosis not present

## 2019-04-19 DIAGNOSIS — E118 Type 2 diabetes mellitus with unspecified complications: Secondary | ICD-10-CM | POA: Diagnosis not present

## 2019-04-19 DIAGNOSIS — D649 Anemia, unspecified: Secondary | ICD-10-CM | POA: Diagnosis not present

## 2019-04-19 DIAGNOSIS — Z992 Dependence on renal dialysis: Secondary | ICD-10-CM | POA: Diagnosis not present

## 2019-04-19 DIAGNOSIS — I129 Hypertensive chronic kidney disease with stage 1 through stage 4 chronic kidney disease, or unspecified chronic kidney disease: Secondary | ICD-10-CM | POA: Diagnosis not present

## 2019-04-19 DIAGNOSIS — D509 Iron deficiency anemia, unspecified: Secondary | ICD-10-CM | POA: Diagnosis not present

## 2019-04-19 DIAGNOSIS — N2581 Secondary hyperparathyroidism of renal origin: Secondary | ICD-10-CM | POA: Diagnosis not present

## 2019-04-19 DIAGNOSIS — N186 End stage renal disease: Secondary | ICD-10-CM | POA: Diagnosis not present

## 2019-04-21 DIAGNOSIS — N186 End stage renal disease: Secondary | ICD-10-CM | POA: Diagnosis not present

## 2019-04-21 DIAGNOSIS — E118 Type 2 diabetes mellitus with unspecified complications: Secondary | ICD-10-CM | POA: Diagnosis not present

## 2019-04-21 DIAGNOSIS — D649 Anemia, unspecified: Secondary | ICD-10-CM | POA: Diagnosis not present

## 2019-04-21 DIAGNOSIS — N2581 Secondary hyperparathyroidism of renal origin: Secondary | ICD-10-CM | POA: Diagnosis not present

## 2019-04-21 DIAGNOSIS — D509 Iron deficiency anemia, unspecified: Secondary | ICD-10-CM | POA: Diagnosis not present

## 2019-04-24 DIAGNOSIS — N186 End stage renal disease: Secondary | ICD-10-CM | POA: Diagnosis not present

## 2019-04-24 DIAGNOSIS — N2581 Secondary hyperparathyroidism of renal origin: Secondary | ICD-10-CM | POA: Diagnosis not present

## 2019-04-24 DIAGNOSIS — E118 Type 2 diabetes mellitus with unspecified complications: Secondary | ICD-10-CM | POA: Diagnosis not present

## 2019-04-24 DIAGNOSIS — D509 Iron deficiency anemia, unspecified: Secondary | ICD-10-CM | POA: Diagnosis not present

## 2019-04-24 DIAGNOSIS — D649 Anemia, unspecified: Secondary | ICD-10-CM | POA: Diagnosis not present

## 2019-04-26 DIAGNOSIS — N186 End stage renal disease: Secondary | ICD-10-CM | POA: Diagnosis not present

## 2019-04-26 DIAGNOSIS — D509 Iron deficiency anemia, unspecified: Secondary | ICD-10-CM | POA: Diagnosis not present

## 2019-04-26 DIAGNOSIS — D649 Anemia, unspecified: Secondary | ICD-10-CM | POA: Diagnosis not present

## 2019-04-26 DIAGNOSIS — N2581 Secondary hyperparathyroidism of renal origin: Secondary | ICD-10-CM | POA: Diagnosis not present

## 2019-04-26 DIAGNOSIS — E118 Type 2 diabetes mellitus with unspecified complications: Secondary | ICD-10-CM | POA: Diagnosis not present

## 2019-04-28 DIAGNOSIS — D509 Iron deficiency anemia, unspecified: Secondary | ICD-10-CM | POA: Diagnosis not present

## 2019-04-28 DIAGNOSIS — D649 Anemia, unspecified: Secondary | ICD-10-CM | POA: Diagnosis not present

## 2019-04-28 DIAGNOSIS — N2581 Secondary hyperparathyroidism of renal origin: Secondary | ICD-10-CM | POA: Diagnosis not present

## 2019-04-28 DIAGNOSIS — E118 Type 2 diabetes mellitus with unspecified complications: Secondary | ICD-10-CM | POA: Diagnosis not present

## 2019-04-28 DIAGNOSIS — N186 End stage renal disease: Secondary | ICD-10-CM | POA: Diagnosis not present

## 2019-04-30 DIAGNOSIS — N186 End stage renal disease: Secondary | ICD-10-CM | POA: Diagnosis not present

## 2019-04-30 DIAGNOSIS — D509 Iron deficiency anemia, unspecified: Secondary | ICD-10-CM | POA: Diagnosis not present

## 2019-04-30 DIAGNOSIS — D649 Anemia, unspecified: Secondary | ICD-10-CM | POA: Diagnosis not present

## 2019-04-30 DIAGNOSIS — N2581 Secondary hyperparathyroidism of renal origin: Secondary | ICD-10-CM | POA: Diagnosis not present

## 2019-04-30 DIAGNOSIS — E118 Type 2 diabetes mellitus with unspecified complications: Secondary | ICD-10-CM | POA: Diagnosis not present

## 2019-05-03 DIAGNOSIS — D649 Anemia, unspecified: Secondary | ICD-10-CM | POA: Diagnosis not present

## 2019-05-03 DIAGNOSIS — N186 End stage renal disease: Secondary | ICD-10-CM | POA: Diagnosis not present

## 2019-05-03 DIAGNOSIS — E118 Type 2 diabetes mellitus with unspecified complications: Secondary | ICD-10-CM | POA: Diagnosis not present

## 2019-05-03 DIAGNOSIS — D509 Iron deficiency anemia, unspecified: Secondary | ICD-10-CM | POA: Diagnosis not present

## 2019-05-03 DIAGNOSIS — N2581 Secondary hyperparathyroidism of renal origin: Secondary | ICD-10-CM | POA: Diagnosis not present

## 2019-05-05 DIAGNOSIS — N186 End stage renal disease: Secondary | ICD-10-CM | POA: Diagnosis not present

## 2019-05-05 DIAGNOSIS — D649 Anemia, unspecified: Secondary | ICD-10-CM | POA: Diagnosis not present

## 2019-05-05 DIAGNOSIS — E118 Type 2 diabetes mellitus with unspecified complications: Secondary | ICD-10-CM | POA: Diagnosis not present

## 2019-05-05 DIAGNOSIS — N2581 Secondary hyperparathyroidism of renal origin: Secondary | ICD-10-CM | POA: Diagnosis not present

## 2019-05-05 DIAGNOSIS — D509 Iron deficiency anemia, unspecified: Secondary | ICD-10-CM | POA: Diagnosis not present

## 2019-05-06 ENCOUNTER — Telehealth (INDEPENDENT_AMBULATORY_CARE_PROVIDER_SITE_OTHER): Payer: Medicare Other | Admitting: Physician Assistant

## 2019-05-06 DIAGNOSIS — R059 Cough, unspecified: Secondary | ICD-10-CM

## 2019-05-06 DIAGNOSIS — R5383 Other fatigue: Secondary | ICD-10-CM | POA: Diagnosis not present

## 2019-05-06 DIAGNOSIS — I10 Essential (primary) hypertension: Secondary | ICD-10-CM

## 2019-05-06 DIAGNOSIS — R05 Cough: Secondary | ICD-10-CM

## 2019-05-06 DIAGNOSIS — N186 End stage renal disease: Secondary | ICD-10-CM | POA: Diagnosis not present

## 2019-05-06 DIAGNOSIS — I110 Hypertensive heart disease with heart failure: Secondary | ICD-10-CM | POA: Diagnosis not present

## 2019-05-06 DIAGNOSIS — I5030 Unspecified diastolic (congestive) heart failure: Secondary | ICD-10-CM

## 2019-05-06 DIAGNOSIS — Z992 Dependence on renal dialysis: Secondary | ICD-10-CM

## 2019-05-06 NOTE — Progress Notes (Signed)
Patient: Alan Gross Male    DOB: July 23, 1986   33 y.o.   MRN: [id number] Visit Date: 05/06/2019  Today's Provider: Trinna Post, PA-C   Chief Complaint  Patient presents with  . Skin Problem   Subjective:    I, Porsha McClurkin CMA, am acting as a Education administrator for [employer], PA-C.  Virtual Visit via Video Note  I connected with Alan Gross on 05/06/19 at  3:20 PM EDT by a video enabled telemedicine application and verified that I am speaking with the correct person using two identifiers.  Location: Patient: Home Provider: Office   I discussed the limitations of evaluation and management by telemedicine and the availability of in person appointments. The patient expressed understanding and agreed to proceed.  HPI  Skin Problem Patient reports that he had a bruise to his right side that appeared 3 days ago. Patient states that he has not had a fall or any injuries to the area. He states that it is partially going away and changing in color.   Patient also reports increasing cough and fatigue. Denies swelling. He has a history of ESRD 2/2 HTN and also concomitant heart failure.   Allergies  Allergen Reactions  . Bee Venom Anaphylaxis  . Lasix [Furosemide] Shortness Of Breath, Swelling and Anxiety     Current Outpatient Medications:  .  amLODipine (NORVASC) 10 MG tablet, Take 1 tablet (10 mg total) by mouth every evening., Disp: 30 tablet, Rfl: 1 .  losartan (COZAAR) 100 MG tablet, Take 1 tablet (100 mg total) by mouth every evening., Disp: 30 tablet, Rfl: 1 .  metoprolol succinate (TOPROL-XL) 25 MG 24 hr tablet, Take 25 mg by mouth daily., Disp: , Rfl:  .  minoxidil (LONITEN) 10 MG tablet, Take 10 mg by mouth 2 (two) times daily with a meal. , Disp: , Rfl: 3 .  sertraline (ZOLOFT) 25 MG tablet, Take 1 tablet (25 mg total) by mouth daily., Disp: 90 tablet, Rfl: 0 .  sucroferric oxyhydroxide (VELPHORO) 500 MG chewable tablet, Chew 500 mg by mouth 4 (four)  times daily., Disp: , Rfl:  .  diphenhydrAMINE (BENADRYL) 25 mg capsule, Take by mouth., Disp: , Rfl:   Review of Systems  Constitutional: Negative.   Respiratory: Negative.   Genitourinary: Negative.   Neurological: Negative.     Social History   Tobacco Use  . Smoking status: Current Every Day Smoker    Packs/day: 0.25    Years: 10.00    Pack years: 2.50    Types: Cigarettes    Last attempt to quit: 06/22/2016    Years since quitting: 2.8  . Smokeless tobacco: Never Used  Substance Use Topics  . Alcohol use: Not Currently    Comment: Occasionally      Objective:   There were no vitals taken for this visit. There were no vitals filed for this visit.   Physical Exam Skin:             Assessment & Plan     1. Cough  Advised on self limiting nature of bruise. Test as below.   - DG Chest 2 View; Future  2. Other fatigue  - TSH  3. Essential hypertension   4. Heart failure, diastolic, due to HTN ([employer])  - Lipid Profile  5. ESRD (end stage renal disease) on dialysis ([place])  - Comprehensive Metabolic Panel (CMET) - CBC with Differential  I discussed the assessment and treatment plan with  the patient. The patient was provided an opportunity to ask questions and all were answered. The patient agreed with the plan and demonstrated an understanding of the instructions.   The patient was advised to call back or seek an in-person evaluation if the symptoms worsen or if the condition fails to improve as anticipated.  I provided 15 minutes of non-face-to-face time during this encounter.     Trinna Post, PA-C  [facility]

## 2019-05-07 DIAGNOSIS — N186 End stage renal disease: Secondary | ICD-10-CM | POA: Diagnosis not present

## 2019-05-07 DIAGNOSIS — E118 Type 2 diabetes mellitus with unspecified complications: Secondary | ICD-10-CM | POA: Diagnosis not present

## 2019-05-07 DIAGNOSIS — N2581 Secondary hyperparathyroidism of renal origin: Secondary | ICD-10-CM | POA: Diagnosis not present

## 2019-05-07 DIAGNOSIS — D649 Anemia, unspecified: Secondary | ICD-10-CM | POA: Diagnosis not present

## 2019-05-07 DIAGNOSIS — D509 Iron deficiency anemia, unspecified: Secondary | ICD-10-CM | POA: Diagnosis not present

## 2019-05-10 DIAGNOSIS — E118 Type 2 diabetes mellitus with unspecified complications: Secondary | ICD-10-CM | POA: Diagnosis not present

## 2019-05-10 DIAGNOSIS — D509 Iron deficiency anemia, unspecified: Secondary | ICD-10-CM | POA: Diagnosis not present

## 2019-05-10 DIAGNOSIS — N186 End stage renal disease: Secondary | ICD-10-CM | POA: Diagnosis not present

## 2019-05-10 DIAGNOSIS — N2581 Secondary hyperparathyroidism of renal origin: Secondary | ICD-10-CM | POA: Diagnosis not present

## 2019-05-10 DIAGNOSIS — D649 Anemia, unspecified: Secondary | ICD-10-CM | POA: Diagnosis not present

## 2019-05-12 DIAGNOSIS — N2581 Secondary hyperparathyroidism of renal origin: Secondary | ICD-10-CM | POA: Diagnosis not present

## 2019-05-12 DIAGNOSIS — E118 Type 2 diabetes mellitus with unspecified complications: Secondary | ICD-10-CM | POA: Diagnosis not present

## 2019-05-12 DIAGNOSIS — D509 Iron deficiency anemia, unspecified: Secondary | ICD-10-CM | POA: Diagnosis not present

## 2019-05-12 DIAGNOSIS — N186 End stage renal disease: Secondary | ICD-10-CM | POA: Diagnosis not present

## 2019-05-12 DIAGNOSIS — D649 Anemia, unspecified: Secondary | ICD-10-CM | POA: Diagnosis not present

## 2019-05-13 ENCOUNTER — Ambulatory Visit: Payer: Medicare Other | Admitting: Gastroenterology

## 2019-05-14 DIAGNOSIS — N186 End stage renal disease: Secondary | ICD-10-CM | POA: Diagnosis not present

## 2019-05-14 DIAGNOSIS — E118 Type 2 diabetes mellitus with unspecified complications: Secondary | ICD-10-CM | POA: Diagnosis not present

## 2019-05-14 DIAGNOSIS — N2581 Secondary hyperparathyroidism of renal origin: Secondary | ICD-10-CM | POA: Diagnosis not present

## 2019-05-14 DIAGNOSIS — D509 Iron deficiency anemia, unspecified: Secondary | ICD-10-CM | POA: Diagnosis not present

## 2019-05-14 DIAGNOSIS — D649 Anemia, unspecified: Secondary | ICD-10-CM | POA: Diagnosis not present

## 2019-05-17 ENCOUNTER — Other Ambulatory Visit: Payer: Self-pay

## 2019-05-17 ENCOUNTER — Emergency Department
Admission: EM | Admit: 2019-05-17 | Discharge: 2019-05-17 | Disposition: A | Discharge status: Home / Self Care | Payer: Medicare Other | Attending: Emergency Medicine | Admitting: Emergency Medicine

## 2019-05-17 DIAGNOSIS — D509 Iron deficiency anemia, unspecified: Secondary | ICD-10-CM | POA: Diagnosis not present

## 2019-05-17 DIAGNOSIS — I5030 Unspecified diastolic (congestive) heart failure: Secondary | ICD-10-CM | POA: Diagnosis not present

## 2019-05-17 DIAGNOSIS — I132 Hypertensive heart and chronic kidney disease with heart failure and with stage 5 chronic kidney disease, or end stage renal disease: Secondary | ICD-10-CM | POA: Insufficient documentation

## 2019-05-17 DIAGNOSIS — Z992 Dependence on renal dialysis: Secondary | ICD-10-CM | POA: Diagnosis not present

## 2019-05-17 DIAGNOSIS — N186 End stage renal disease: Secondary | ICD-10-CM | POA: Diagnosis not present

## 2019-05-17 DIAGNOSIS — Z79899 Other long term (current) drug therapy: Secondary | ICD-10-CM | POA: Diagnosis not present

## 2019-05-17 DIAGNOSIS — F17210 Nicotine dependence, cigarettes, uncomplicated: Secondary | ICD-10-CM | POA: Diagnosis not present

## 2019-05-17 DIAGNOSIS — Z8673 Personal history of transient ischemic attack (TIA), and cerebral infarction without residual deficits: Secondary | ICD-10-CM | POA: Diagnosis not present

## 2019-05-17 DIAGNOSIS — F1210 Cannabis abuse, uncomplicated: Secondary | ICD-10-CM | POA: Insufficient documentation

## 2019-05-17 DIAGNOSIS — D649 Anemia, unspecified: Secondary | ICD-10-CM | POA: Diagnosis not present

## 2019-05-17 DIAGNOSIS — N2581 Secondary hyperparathyroidism of renal origin: Secondary | ICD-10-CM | POA: Diagnosis not present

## 2019-05-17 DIAGNOSIS — R42 Dizziness and giddiness: Secondary | ICD-10-CM | POA: Diagnosis not present

## 2019-05-17 DIAGNOSIS — I951 Orthostatic hypotension: Secondary | ICD-10-CM | POA: Diagnosis not present

## 2019-05-17 DIAGNOSIS — E118 Type 2 diabetes mellitus with unspecified complications: Secondary | ICD-10-CM | POA: Diagnosis not present

## 2019-05-17 LAB — CBC
HCT: 31 % — ABNORMAL LOW (ref 39.0–52.0)
Hemoglobin: 10.2 g/dL — ABNORMAL LOW (ref 13.0–17.0)
MCH: 29.8 pg (ref 26.0–34.0)
MCHC: 32.9 g/dL (ref 30.0–36.0)
MCV: 90.6 fL (ref 80.0–100.0)
Platelets: 169 10*3/uL (ref 150–400)
RBC: 3.42 MIL/uL — ABNORMAL LOW (ref 4.22–5.81)
RDW: 13.3 % (ref 11.5–15.5)
WBC: 8 10*3/uL (ref 4.0–10.5)
nRBC: 0 % (ref 0.0–0.2)

## 2019-05-17 LAB — COMPREHENSIVE METABOLIC PANEL
ALT: 16 U/L (ref 0–44)
AST: 21 U/L (ref 15–41)
Albumin: 4 g/dL (ref 3.5–5.0)
Alkaline Phosphatase: 48 U/L (ref 38–126)
Anion gap: 13 (ref 5–15)
BUN: 28 mg/dL — ABNORMAL HIGH (ref 6–20)
CO2: 32 mmol/L (ref 22–32)
Calcium: 8.9 mg/dL (ref 8.9–10.3)
Chloride: 93 mmol/L — ABNORMAL LOW (ref 98–111)
Creatinine, Ser: 7.87 mg/dL — ABNORMAL HIGH (ref 0.61–1.24)
GFR calc Af Amer: 9 mL/min — ABNORMAL LOW (ref >60)
GFR calc non Af Amer: 8 mL/min — ABNORMAL LOW (ref >60)
Glucose, Bld: 107 mg/dL — ABNORMAL HIGH (ref 70–99)
Potassium: 3.3 mmol/L — ABNORMAL LOW (ref 3.5–5.1)
Sodium: 138 mmol/L (ref 135–145)
Total Bilirubin: 0.5 mg/dL (ref 0.3–1.2)
Total Protein: 7.2 g/dL (ref 6.5–8.1)

## 2019-05-17 NOTE — ED Notes (Signed)
Dialysis called at this time to de acess pt fistula, states they will be up shortly.

## 2019-05-17 NOTE — ED Triage Notes (Signed)
Pt arrives via EMS after feeling dizzy at dialysis about 2 hours in- pt has not been taking his BP medications on a regular basis and about 2 months ago started taking them again- pt reports having spells of dizziness since taking medications- BP at 128/61 per EMS which is "low for the pt"

## 2019-05-17 NOTE — ED Provider Notes (Signed)
[facility] Emergency Department Provider Note  ____________________________________________   First MD Initiated Contact with Patient 05/17/19 1250     (approximate)  I have reviewed the triage vital signs and the nursing notes.   HISTORY  Chief Complaint Dizziness    HPI Alan Gross is a 33 y.o. male below list of previous medical conditions including end-stage renal disease with dialysis Monday Wednesday Friday presents to the emergency department via EMS with complaint of dizziness with positional change.  Patient states that he was noncompliant with his medication in the past however for the past 2 months he has been vigilant in taking his medications.  Patient states he usually takes his antihypertensives at night however today he took 1 of his antihypertensive before going to dialysis.  Patient states that while at dialysis he felt acutely dizzy.  Patient denies any pain.  Patient denies any shortness of breath.  Patient denies any weakness numbness or visual changes.    Past Medical History:  Diagnosis Date  . Anxiety   . Depression   . Dyspnea   . ESRD (end stage renal disease) ([place])   . H/O cardiac catheterization   . History of febrile seizure   . Hypertension   . Irregular heart beats   . Kidney failure    M,W,F [place] in [place]; 15 %   . 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 Hypertensive left ventricular hypertrophy, without heart failure 12/25/2018  . Fluid overload 10/27/2017  . Chest pain 09/03/2017  . Elevated troponin 08/18/2017  . 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 ESRD (end stage renal disease) on dialysis ([place]) 07/19/2016  . Anxiety 07/19/2016  . Heart failure, diastolic, due to HTN ([place]) 07/19/2016  . HTN (hypertension) 07/09/2016    Past Surgical History:  Procedure Laterality Date  . A/V FISTULAGRAM  N/A 05/07/2017   Procedure: A/V Fistulagram;  Surgeon: Algernon Huxley, MD;  Location: [place] [facility];  Service: Cardiovascular;  Laterality: N/A;  . AV FISTULA PLACEMENT Left 09/10/2016   Procedure: LEFT ARTERIOVENOUS (AV) FISTULA CREATION;  Surgeon: Conrad Eva, MD;  Location: [place];  Service: Vascular;  Laterality: Left;  . CARDIAC CATHETERIZATION Left 05/02/2015   Procedure: Left Heart Cath and Coronary Angiography;  Surgeon: Dionisio David, MD;  Location: [place] [facility];  Service: Cardiovascular;  Laterality: Left;  . CORONARY ANGIOPLASTY    . PERIPHERAL VASCULAR CATHETERIZATION N/A 07/11/2016   Procedure: Dialysis/Perma Catheter Insertion;  Surgeon: Algernon Huxley, MD;  Location: [place] [facility];  Service: Cardiovascular;  Laterality: N/A;  . PERIPHERAL VASCULAR THROMBECTOMY Left 05/07/2017   Procedure: Peripheral Vascular Thrombectomy;  Surgeon: Algernon Huxley, MD;  Location: [place] [facility];  Service: Cardiovascular;  Laterality: Left;    Prior to Admission medications   Medication Sig Start Date End Date Taking? Authorizing Provider  amLODipine (NORVASC) 10 MG tablet Take 1 tablet (10 mg total) by mouth every evening. 07/10/17   Awilda Bill, NP  diphenhydrAMINE (BENADRYL) 25 mg capsule Take by mouth. 03/23/19 04/02/19  [provider]  losartan (COZAAR) 100 MG tablet Take 1 tablet (100 mg total) by mouth every evening. 09/04/17   Gladstone Lighter, MD  metoprolol succinate (TOPROL-XL) 25 MG 24 hr tablet Take 25 mg by mouth daily. 12/15/18   [provider]  minoxidil (LONITEN) 10 MG  tablet Take 10 mg by mouth 2 (two) times daily with a meal.     [provider]  sertraline (ZOLOFT) 25 MG tablet Take 1 tablet (25 mg total) by mouth daily. 03/23/19 06/21/19  Trinna Post, PA-C  sucroferric oxyhydroxide (VELPHORO) 500 MG chewable tablet Chew 500 mg by mouth 4 (four) times daily.    [provider]    Allergies Bee venom and Lasix  [furosemide]  Family History  Problem Relation Age of Onset  . Hypertension Other   . Cancer Father        liver  . Hypertension Brother   . Diabetes Paternal Aunt   . Diabetes Paternal Uncle     Social History Social History   Tobacco Use  . Smoking status: Current Every Day Smoker    Packs/day: 0.25    Years: 10.00    Pack years: 2.50    Types: Cigarettes    Last attempt to quit: 06/22/2016    Years since quitting: 2.9  . Smokeless tobacco: Never Used  Substance Use Topics  . Alcohol use: Not Currently    Comment: Occasionally  . Drug use: Yes    Types: Marijuana    Review of Systems Constitutional: No fever/chills Eyes: No visual changes. ENT: No sore throat. Cardiovascular: Denies chest pain. Respiratory: Denies shortness of breath. Gastrointestinal: No abdominal pain.  No nausea, no vomiting.  No diarrhea.  No constipation. Genitourinary: Negative for dysuria. Musculoskeletal: Negative for neck pain.  Negative for back pain. Integumentary: Negative for rash. Neurological: Negative for headaches, focal weakness or numbness.  Positive for dizziness now resolved.   ____________________________________________   PHYSICAL EXAM:  VITAL SIGNS: ED Triage Vitals  Enc Vitals Group     BP 05/17/19 1253 137/67     Pulse Rate 05/17/19 1253 86     Resp 05/17/19 1253 16     Temp 05/17/19 1253 98.1 F (36.7 C)     Temp Source 05/17/19 1253 Oral     SpO2 05/17/19 1253 98 %     Weight 05/17/19 1251 111.1 kg (245 lb)     Height 05/17/19 1251 1.854 m (6\' 1" )     Head Circumference --      Peak Flow --      Pain Score 05/17/19 1249 0     Pain Loc --      Pain Edu? --      Excl. in [place]? --     Constitutional: Alert and oriented. Well appearing and in no acute distress. Eyes: Conjunctivae are normal. PERRL. EOMI. Mouth/Throat: Mucous membranes are moist.  Oropharynx non-erythematous. Neck: No stridor.  Cardiovascular: Normal rate, regular rhythm. Good peripheral  circulation. Grossly normal heart sounds. Respiratory: Normal respiratory effort.  No retractions. No audible wheezing. Gastrointestinal: Soft and nontender. No distention.  Musculoskeletal: No lower extremity tenderness nor edema. No gross deformities of extremities. Neurologic:  Normal speech and language. No gross focal neurologic deficits are appreciated.  Skin:  Skin is warm, dry and intact. No rash noted. Psychiatric: Mood and affect are normal. Speech and behavior are normal.  ____________________________________________   LABS (all labs ordered are listed, but only abnormal results are displayed)  Labs Reviewed  CBC  COMPREHENSIVE METABOLIC PANEL     Procedures   ____________________________________________   INITIAL IMPRESSION / MDM / [place] / ED COURSE  As part of my medical decision making, I reviewed the following data within the electronic MEDICAL RECORD NUMBER  33 year old male presented with  above-stated history and physical exam secondary to dizziness which patient states he notes after taking his hypertensives.  Orthostatic vital signs obtained in the emergency department consistent with orthostatic hypotension.  Patient became acutely dizzy upon standing blood pressure dropped approximately 23 points.  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  Patient's potassium is 3.3.  I suspect the patient symptoms to be secondary to orthostatic hypotension possibly medication induced given reported history.  I instructed the patient to keep a log of his blood pressure over the next 2 days and presented to his primary care provider upon outpatient follow-up.  *Alan Gross was evaluated in Emergency Department on 05/17/2019 for the symptoms described in the history of present illness. He was evaluated in the context of the global COVID-19 pandemic, which necessitated consideration that the patient might be at risk for infection  with the SARS-CoV-2 virus that causes COVID-19. Institutional protocols and algorithms that pertain to the evaluation of patients at risk for COVID-19 are in a state of rapid change based on information released by regulatory bodies including the CDC and federal and state organizations. These policies and algorithms were followed during the patient's care in the ED.  Some ED evaluations and interventions may be delayed as a result of limited staffing during the pandemic.*   ____________________________________________  FINAL CLINICAL IMPRESSION(S) / ED DIAGNOSES  Final diagnoses:  Orthostatic hypotension     MEDICATIONS GIVEN DURING THIS VISIT:  Medications - No data to display   ED Discharge Orders    None       Note:  This document was prepared using Dragon voice recognition software and may include unintentional dictation errors.   Gregor Hams, MD 05/17/19 [phone]

## 2019-05-17 NOTE — ED Notes (Signed)
Pt sent from dialysis with fistula still accessed

## 2019-05-17 NOTE — ED Notes (Signed)
Pt refuses need for wheelchair, ambulatory with steady gait noted on discharge

## 2019-05-17 NOTE — ED Notes (Signed)
Pt symptomatic while standing for BP c/o dizziness, MD aware

## 2019-05-17 NOTE — TOC Initial Note (Signed)
Transition of Care [facility]) - Initial/Assessment Note    Patient Details  Name: Alan Gross MRN: [id number] Date of Birth: [birth year]  [facility] CM/SW Contact:    Marshell Garfinkel, RN Phone Number: 05/17/2019, 2:09 PM  Clinical Narrative:                 Estill Bamberg with [employer] notified.        Patient Goals and CMS Choice        Expected Discharge Plan and Services                                                Prior Living Arrangements/Services                       Activities of Daily Living      Permission Sought/Granted                  Emotional Assessment              Admission diagnosis:  dizziness ems Patient Active Problem List   Diagnosis Date Noted  . Hypertensive left ventricular hypertrophy, without heart failure 12/25/2018  . Fluid overload 10/27/2017  . Chest pain 09/03/2017  . Elevated troponin 08/18/2017  . 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 ESRD (end stage renal disease) on dialysis ([place]) 07/19/2016  . Anxiety 07/19/2016  . Heart failure, diastolic, due to HTN ([place]) 07/19/2016  . HTN (hypertension) 07/09/2016   PCP:  Trinna Post, PA-C Pharmacy:   [facility] [address], [place] - [place] [address] [place] [address] Phone: [phone] Fax: [place], [place] [address] Phone: [phone] Fax: [phone]     Social Determinants of Health (SDOH) Interventions    Readmission Risk Interventions No flowsheet data found.

## 2019-05-19 DIAGNOSIS — N186 End stage renal disease: Secondary | ICD-10-CM | POA: Diagnosis not present

## 2019-05-19 DIAGNOSIS — D649 Anemia, unspecified: Secondary | ICD-10-CM | POA: Diagnosis not present

## 2019-05-19 DIAGNOSIS — N2581 Secondary hyperparathyroidism of renal origin: Secondary | ICD-10-CM | POA: Diagnosis not present

## 2019-05-19 DIAGNOSIS — E118 Type 2 diabetes mellitus with unspecified complications: Secondary | ICD-10-CM | POA: Diagnosis not present

## 2019-05-19 DIAGNOSIS — I129 Hypertensive chronic kidney disease with stage 1 through stage 4 chronic kidney disease, or unspecified chronic kidney disease: Secondary | ICD-10-CM | POA: Diagnosis not present

## 2019-05-19 DIAGNOSIS — Z992 Dependence on renal dialysis: Secondary | ICD-10-CM | POA: Diagnosis not present

## 2019-05-19 DIAGNOSIS — D509 Iron deficiency anemia, unspecified: Secondary | ICD-10-CM | POA: Diagnosis not present

## 2019-05-20 ENCOUNTER — Other Ambulatory Visit: Payer: Self-pay

## 2019-05-20 ENCOUNTER — Observation Stay
Admission: EM | Admit: 2019-05-20 | Discharge: 2019-05-21 | Disposition: A | Discharge status: Home / Self Care | Payer: Medicare Other | Attending: Nurse Practitioner | Admitting: Nurse Practitioner

## 2019-05-20 ENCOUNTER — Emergency Department: Payer: Medicare Other

## 2019-05-20 ENCOUNTER — Encounter: Payer: Self-pay | Admitting: Emergency Medicine

## 2019-05-20 DIAGNOSIS — R0602 Shortness of breath: Secondary | ICD-10-CM | POA: Diagnosis not present

## 2019-05-20 DIAGNOSIS — R079 Chest pain, unspecified: Secondary | ICD-10-CM | POA: Diagnosis not present

## 2019-05-20 DIAGNOSIS — Z8249 Family history of ischemic heart disease and other diseases of the circulatory system: Secondary | ICD-10-CM | POA: Diagnosis not present

## 2019-05-20 DIAGNOSIS — F419 Anxiety disorder, unspecified: Secondary | ICD-10-CM | POA: Insufficient documentation

## 2019-05-20 DIAGNOSIS — N186 End stage renal disease: Secondary | ICD-10-CM | POA: Insufficient documentation

## 2019-05-20 DIAGNOSIS — Z79899 Other long term (current) drug therapy: Secondary | ICD-10-CM | POA: Diagnosis not present

## 2019-05-20 DIAGNOSIS — I10 Essential (primary) hypertension: Secondary | ICD-10-CM | POA: Diagnosis not present

## 2019-05-20 DIAGNOSIS — Z1159 Encounter for screening for other viral diseases: Secondary | ICD-10-CM | POA: Insufficient documentation

## 2019-05-20 DIAGNOSIS — Z8673 Personal history of transient ischemic attack (TIA), and cerebral infarction without residual deficits: Secondary | ICD-10-CM | POA: Diagnosis not present

## 2019-05-20 DIAGNOSIS — Z992 Dependence on renal dialysis: Secondary | ICD-10-CM | POA: Diagnosis not present

## 2019-05-20 DIAGNOSIS — R000 Tachycardia, unspecified: Secondary | ICD-10-CM | POA: Diagnosis not present

## 2019-05-20 DIAGNOSIS — Z20828 Contact with and (suspected) exposure to other viral communicable diseases: Secondary | ICD-10-CM | POA: Diagnosis not present

## 2019-05-20 DIAGNOSIS — I161 Hypertensive emergency: Principal | ICD-10-CM | POA: Insufficient documentation

## 2019-05-20 DIAGNOSIS — I132 Hypertensive heart and chronic kidney disease with heart failure and with stage 5 chronic kidney disease, or end stage renal disease: Secondary | ICD-10-CM | POA: Insufficient documentation

## 2019-05-20 DIAGNOSIS — F17210 Nicotine dependence, cigarettes, uncomplicated: Secondary | ICD-10-CM | POA: Insufficient documentation

## 2019-05-20 DIAGNOSIS — F329 Major depressive disorder, single episode, unspecified: Secondary | ICD-10-CM | POA: Diagnosis not present

## 2019-05-20 DIAGNOSIS — I252 Old myocardial infarction: Secondary | ICD-10-CM | POA: Diagnosis not present

## 2019-05-20 LAB — CBC
HCT: 27.8 % — ABNORMAL LOW (ref 39.0–52.0)
Hemoglobin: 9.1 g/dL — ABNORMAL LOW (ref 13.0–17.0)
MCH: 30 pg (ref 26.0–34.0)
MCHC: 32.7 g/dL (ref 30.0–36.0)
MCV: 91.7 fL (ref 80.0–100.0)
Platelets: 144 10*3/uL — ABNORMAL LOW (ref 150–400)
RBC: 3.03 MIL/uL — ABNORMAL LOW (ref 4.22–5.81)
RDW: 13.6 % (ref 11.5–15.5)
WBC: 7.2 10*3/uL (ref 4.0–10.5)
nRBC: 0 % (ref 0.0–0.2)

## 2019-05-20 LAB — BASIC METABOLIC PANEL
Anion gap: 10 (ref 5–15)
BUN: 39 mg/dL — ABNORMAL HIGH (ref 6–20)
CO2: 32 mmol/L (ref 22–32)
Calcium: 9.3 mg/dL (ref 8.9–10.3)
Chloride: 95 mmol/L — ABNORMAL LOW (ref 98–111)
Creatinine, Ser: 10.16 mg/dL — ABNORMAL HIGH (ref 0.61–1.24)
GFR calc Af Amer: 7 mL/min — ABNORMAL LOW (ref >60)
GFR calc non Af Amer: 6 mL/min — ABNORMAL LOW (ref >60)
Glucose, Bld: 129 mg/dL — ABNORMAL HIGH (ref 70–99)
Potassium: 3.4 mmol/L — ABNORMAL LOW (ref 3.5–5.1)
Sodium: 137 mmol/L (ref 135–145)

## 2019-05-20 LAB — TROPONIN I (HIGH SENSITIVITY): Troponin I (High Sensitivity): 94 ng/L — ABNORMAL HIGH

## 2019-05-20 MED ORDER — NITROGLYCERIN 2 % TD OINT
1.0000 [in_us] | TOPICAL_OINTMENT | TRANSDERMAL | Status: AC
  Administered 2019-05-20: 22:00:00 1 [in_us] via TOPICAL
  Filled 2019-05-20: qty 1

## 2019-05-20 MED ORDER — AMLODIPINE BESYLATE 5 MG PO TABS
10.0000 mg | ORAL_TABLET | Freq: Once | ORAL | Status: AC
  Administered 2019-05-20: 23:00:00 10 mg via ORAL
  Filled 2019-05-20: qty 2

## 2019-05-20 NOTE — ED Notes (Signed)
Pt st he take "blood pressure medication"  "did not take my medication this morning because I slept late and forgot". This RN  will continue to monitor pt.

## 2019-05-20 NOTE — ED Notes (Signed)
Pt c/o left sided CP 2/10. No SHOB at this time.

## 2019-05-20 NOTE — ED Provider Notes (Signed)
[facility] Emergency Department Provider Note   ____________________________________________   First MD Initiated Contact with Patient 05/20/19 2123     (approximate)  I have reviewed the triage vital signs and the nursing notes.   HISTORY  Chief Complaint Chest Pain    HPI Alan Gross is a 33 y.o. male history of end-stage renal disease, previous coronary disease and stroke.  Patient presents for chest pain.  Patient reports rather abrupt onset of chest pain about 30 minutes ago.  Its fairly sharp in nature located in the left upper chest.  Nonradiating.  Does have a history of end-stage renal disease.  Reports significant history of hypertension.  Reports compliance with medications now.  Pain does not radiate.  There is no associated shortness of breath.  No leg swelling.  Reports he had dialysis yesterday and he does not feel like he is has increased fluid levels   EMS reports patient's initial blood pressure in the 240s.  Given 2 doses of nitro, aspirin as well as 25 mcg of fentanyl.  Patient reports he is now pain and symptom-free  Denies nausea or vomiting.  Denies ongoing chest pain or discomfort.  Reports similar in the past due to severe high blood pressures  Past Medical History:  Diagnosis Date  . Anxiety   . Depression   . Dyspnea   . ESRD (end stage renal disease) ([place])   . H/O cardiac catheterization   . History of febrile seizure   . Hypertension   . Irregular heart beats   . Kidney failure    M,W,F [facility] in [place]; 15 %   . 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 Hypertensive left ventricular hypertrophy, without heart failure 12/25/2018  . Fluid overload 10/27/2017  . Chest pain 09/03/2017  . Elevated troponin 08/18/2017  . 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 ESRD (end stage renal disease) on dialysis ([place])  07/19/2016  . Anxiety 07/19/2016  . Heart failure, diastolic, due to HTN ([place]) 07/19/2016  . HTN (hypertension) 07/09/2016    Past Surgical History:  Procedure Laterality Date  . A/V FISTULAGRAM N/A 05/07/2017   Procedure: A/V Fistulagram;  Surgeon: Algernon Huxley, MD;  Location: [place] [facility];  Service: Cardiovascular;  Laterality: N/A;  . AV FISTULA PLACEMENT Left 09/10/2016   Procedure: LEFT ARTERIOVENOUS (AV) FISTULA CREATION;  Surgeon: Conrad Spencer, MD;  Location: [place];  Service: Vascular;  Laterality: Left;  . CARDIAC CATHETERIZATION Left 05/02/2015   Procedure: Left Heart Cath and Coronary Angiography;  Surgeon: Dionisio David, MD;  Location: [place] [facility];  Service: Cardiovascular;  Laterality: Left;  . CORONARY ANGIOPLASTY    . PERIPHERAL VASCULAR CATHETERIZATION N/A 07/11/2016   Procedure: Dialysis/Perma Catheter Insertion;  Surgeon: Algernon Huxley, MD;  Location: [place] [facility];  Service: Cardiovascular;  Laterality: N/A;  . PERIPHERAL VASCULAR THROMBECTOMY Left 05/07/2017   Procedure: Peripheral Vascular Thrombectomy;  Surgeon: Algernon Huxley, MD;  Location: [place] [facility];  Service: Cardiovascular;  Laterality: Left;    Prior to Admission medications   Medication Sig Start Date End Date Taking? Authorizing Provider  amLODipine (NORVASC) 10 MG tablet Take 1 tablet (10 mg total) by mouth every evening. 07/10/17   Awilda Bill, NP  diphenhydrAMINE (BENADRYL) 25 mg capsule Take by mouth. 03/23/19 04/02/19  [provider]  losartan (COZAAR) 100 MG tablet Take 1 tablet (100 mg total) by mouth every evening. 09/04/17   Gladstone Lighter, MD  metoprolol succinate (TOPROL-XL) 25 MG 24 hr tablet Take 25 mg by mouth daily. 12/15/18   [provider]  minoxidil (LONITEN) 10 MG tablet Take 10 mg by mouth 2 (two) times daily with a meal.     [provider]  sertraline (ZOLOFT) 25 MG tablet Take 1 tablet (25 mg total) by mouth daily. 03/23/19  06/21/19  Trinna Post, PA-C  sucroferric oxyhydroxide (VELPHORO) 500 MG chewable tablet Chew 500 mg by mouth 4 (four) times daily.    [provider]    Allergies Bee venom and Lasix [furosemide]  Family History  Problem Relation Age of Onset  . Hypertension Other   . Cancer Father        liver  . Hypertension Brother   . Diabetes Paternal Aunt   . Diabetes Paternal Uncle     Social History Social History   Tobacco Use  . Smoking status: Current Every Day Smoker    Packs/day: 0.25    Years: 10.00    Pack years: 2.50    Types: Cigarettes    Last attempt to quit: 06/22/2016    Years since quitting: 2.9  . Smokeless tobacco: Never Used  Substance Use Topics  . Alcohol use: Not Currently    Comment: Occasionally  . Drug use: Yes    Types: Marijuana    Review of Systems Constitutional: No fever/chills Eyes: No visual changes. ENT: No sore throat. Cardiovascular: See HPI Respiratory: Denies shortness of breath. Gastrointestinal: No abdominal pain.   Genitourinary: Negative for dysuria. Musculoskeletal: Negative for back pain. Skin: Negative for rash. Neurological: Negative for headaches, areas of focal weakness or numbness.    ____________________________________________   PHYSICAL EXAM:  VITAL SIGNS: ED Triage Vitals  Enc Vitals Group     BP 05/20/19 2112 (!) 187/113     Pulse Rate 05/20/19 2114 88     Resp 05/20/19 2112 17     Temp --      Temp src --      SpO2 05/20/19 2114 96 %     Weight 05/20/19 2117 245 lb (111.1 kg)     Height 05/20/19 2117 6\' 1"  (1.854 m)     Head Circumference --      Peak Flow --      Pain Score 05/20/19 2115 2     Pain Loc --      Pain Edu? --      Excl. in [place]? --     Constitutional: Alert and oriented. Well appearing and in no acute distress. Eyes: Conjunctivae are normal. Head: Atraumatic. Nose: No congestion/rhinnorhea. Mouth/Throat: Mucous membranes are moist. Neck: No stridor.  Cardiovascular:  Normal rate, regular rhythm. Grossly normal heart sounds.  Good peripheral circulation. Respiratory: Normal respiratory effort.  No retractions. Lungs CTAB. Gastrointestinal: Soft and nontender. No distention. Musculoskeletal: No lower extremity tenderness nor edema. Neurologic:  Normal speech and language. No Gross focal neurologic deficits are appreciated.  Skin:  Skin is warm, dry and intact. No rash noted. Psychiatric: Mood and affect are normal. Speech and behavior are normal.  ____________________________________________   LABS (all labs ordered are listed, but only abnormal results are displayed)  Labs Reviewed  CBC - Abnormal; Notable for the following components:      Result Value   RBC 3.03 (*)    Hemoglobin 9.1 (*)    HCT 27.8 (*)  Platelets 144 (*)    All other components within normal limits  BASIC METABOLIC PANEL - Abnormal; Notable for the following components:   Potassium 3.4 (*)    Chloride 95 (*)    Glucose, Bld 129 (*)    BUN 39 (*)    Creatinine, Ser 10.16 (*)    GFR calc non Af Amer 6 (*)    GFR calc Af Amer 7 (*)    All other components within normal limits  TROPONIN I (HIGH SENSITIVITY) - Abnormal; Notable for the following components:   Troponin I (High Sensitivity) 94 (*)    All other components within normal limits  NOVEL CORONAVIRUS, NAA (HOSPITAL ORDER, SEND-OUT TO REF LAB)  URINE DRUG SCREEN, QUALITATIVE ([facility] ONLY)   ____________________________________________  EKG  Reviewed entered by me at 2115 Heart rate 90 QRS 100 QTc 500 Normal sinus rhythm, severe left ventricular hypertrophy with repolarization abnormality.  Compared with previous EKG, November 17, 2018 I do not see significant changes. ____________________________________________  JHERDEYCX  Dg Chest Port 1 View  Result Date: 05/20/2019 CLINICAL DATA:  Left chest pain, shortness of breath EXAM: PORTABLE CHEST 1 VIEW COMPARISON:  11/17/2018 FINDINGS: Cardiomegaly, vascular  congestion. No confluent opacities or effusions. No overt edema. No acute bony abnormality. IMPRESSION: Cardiomegaly, vascular congestion. Electronically Signed   By: Rolm Baptise M.D.   On: 05/20/2019 21:58    X-rays reviewed cardiomegaly.  Mild congestion ____________________________________________   PROCEDURES  Procedure(s) performed: None  Procedures  Critical Care performed: No  ____________________________________________   INITIAL IMPRESSION / ASSESSMENT AND PLAN / ED COURSE  Pertinent labs & imaging results that were available during my care of the patient were reviewed by me and considered in my medical decision making (see chart for details).   Differential diagnosis includes, but is not limited to, ACS, aortic dissection, pulmonary embolism, cardiac tamponade, pneumothorax, pneumonia, pericarditis, myocarditis, GI-related causes including esophagitis/gastritis, and musculoskeletal chest wall pain.    Patient presents to EMS with severe hypertension and chest pain.  After nitrates and blood pressure control his chest pain is resolved completely.  Nitroglycerin and Norvasc ordered here.  Patient's blood pressure improved from 187/113 which is market improvement from initial EMS blood pressure  Patient denies fever chills cough cold or infectious symptoms.  We will send referral lab outpatient COVID test  TANIA PERROTT was evaluated in Emergency Department on 05/20/2019 for the symptoms described in the history of present illness. He was evaluated in the context of the global COVID-19 pandemic, which necessitated consideration that the patient might be at risk for infection with the SARS-CoV-2 virus that causes COVID-19. Institutional protocols and algorithms that pertain to the evaluation of patients at risk for COVID-19 are in a state of rapid change based on information released by regulatory bodies including the CDC and federal and state organizations. These policies and  algorithms were followed during the patient's care in the ED.  ----------------------------------------- 10:58 PM on 05/20/2019 -----------------------------------------  Remains pain and symptom-free.  Given his notably elevated troponin but no notable EKG changes I am concerned the patient had hypertensive emergency now that his chest pain has resolved however, I feel it would be prudent to admit him for further observation, serial troponins, and further chest pain evaluation.  I doubt pulmonary embolism.  Doubt dissection.  Now blood pressure improved he is pain and symptom-free.  He has had similar presentations in the past with severe hypertensive urgency, elevated troponins.  Discussed with the patient he is  comfortable with plan for admission.  Admission discussed with Gardiner Barefoot, nurse practitioner       ____________________________________________   FINAL CLINICAL IMPRESSION(S) / ED DIAGNOSES  Final diagnoses:  Hypertensive emergency without congestive heart failure        Note:  This document was prepared using Dragon voice recognition software and may include unintentional dictation errors       Delman Kitten, MD 05/20/19 2259

## 2019-05-20 NOTE — ED Triage Notes (Addendum)
Pt from home via [facility]. Per EMS, pt was laying down when he felt a sudden left sided CP; sweating, SHOB.  Pt st he is on dyalisis (last dyalisis on Tuesday). Per EMS : 200/116 96 %on RA. Given in route by [facility]; 325 aspirin; 2 SB nitroglycerin; 0.54mcg Fentanyl. . EDP Quale  at bedside.

## 2019-05-20 NOTE — ED Notes (Signed)
Pt st CP was a 10/10 prior to EMS arrival. Pt denies CP/SHOB pain at this time.

## 2019-05-21 DIAGNOSIS — R079 Chest pain, unspecified: Secondary | ICD-10-CM | POA: Diagnosis not present

## 2019-05-21 DIAGNOSIS — I10 Essential (primary) hypertension: Secondary | ICD-10-CM | POA: Diagnosis not present

## 2019-05-21 DIAGNOSIS — R7989 Other specified abnormal findings of blood chemistry: Secondary | ICD-10-CM | POA: Diagnosis not present

## 2019-05-21 DIAGNOSIS — N186 End stage renal disease: Secondary | ICD-10-CM | POA: Diagnosis not present

## 2019-05-21 NOTE — H&P (Signed)
[place] [facility] [place] NAME: Alan Gross    MR#:  [id number]  DATE OF BIRTH:  [birth year]  DATE OF ADMISSION:  05/20/2019  PRIMARY CARE PHYSICIAN: Trinna Post, PA-C   REQUESTING/REFERRING PHYSICIAN: Delman Kitten, MD  CHIEF COMPLAINT:   Chief Complaint  Patient presents with  . Chest Pain    HISTORY OF PRESENT ILLNESS:  Alan Gross  is a 33 y.o. male with a known history of hypertension, end-stage renal disease on hemodialysis, depression, coronary artery disease with a history of MI at age 62.  Patient presented to the emergency room via EMS services complaining of left anterior chest pain radiating into his left axillary area which began around 8 PM while he was lying down.  Pain is described as sharp shooting pain with a pain score 10 out of 10 lasting about 15 minutes until the EMS services arrived and patient was given nitroglycerin.  He noted associated diaphoresis however denies shortness of breath, nausea, vomiting, dizziness, or associated weakness with the pain.  On arrival to the emergency room patient's blood pressure is 187/113.  High-sensitivity troponin is 94.  BUN is 39 with creatinine 10.16.  Potassium is 3.4.  Chest x-ray demonstrates vascular congestion.  Patient denies EtOH or illicit drug use.  We have admitted him to the hospitalist service for further management. PAST MEDICAL HISTORY:   Past Medical History:  Diagnosis Date  . Anxiety   . Depression   . Dyspnea   . ESRD (end stage renal disease) ([place])   . H/O cardiac catheterization   . History of febrile seizure   . Hypertension   . Irregular heart beats   . Kidney failure    M,W,F [facility] in [place]; 15 %   . 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 A/V FISTULAGRAM N/A 05/07/2017   Procedure: A/V Fistulagram;  Surgeon: Algernon Huxley, MD;  Location: [place] [facility];  Service: Cardiovascular;  Laterality: N/A;  . AV FISTULA PLACEMENT Left 09/10/2016   Procedure: LEFT ARTERIOVENOUS (AV) FISTULA CREATION;  Surgeon: Conrad Sutter, MD;  Location: [place];  Service: Vascular;  Laterality: Left;  . CARDIAC CATHETERIZATION Left 05/02/2015   Procedure: Left Heart Cath and Coronary Angiography;  Surgeon: Dionisio David, MD;  Location: [place] [facility];  Service: Cardiovascular;  Laterality: Left;  . CORONARY ANGIOPLASTY    . PERIPHERAL VASCULAR CATHETERIZATION N/A 07/11/2016   Procedure: Dialysis/Perma Catheter Insertion;  Surgeon: Algernon Huxley, MD;  Location: [place] [facility];  Service: Cardiovascular;  Laterality: N/A;  . PERIPHERAL VASCULAR THROMBECTOMY Left 05/07/2017   Procedure: Peripheral Vascular Thrombectomy;  Surgeon: Algernon Huxley, MD;  Location: [place] [facility];  Service: Cardiovascular;  Laterality: Left;    SOCIAL HISTORY:   Social History   Tobacco Use  . Smoking status: Current Every Day Smoker    Packs/day: 0.25    Years: 10.00    Pack years: 2.50    Types: Cigarettes    Last attempt to quit: 06/22/2016    Years since quitting: 2.9  . Smokeless tobacco: Never Used  Substance Use Topics  . Alcohol use: Not Currently    Comment: Occasionally    FAMILY HISTORY:   Family History  Problem Relation Age of Onset  . Hypertension Other   . Cancer Father  liver  . Hypertension Brother   . Diabetes Paternal Aunt   . Diabetes Paternal Uncle     DRUG ALLERGIES:   Allergies  Allergen Reactions  . Bee Venom Anaphylaxis  . Lasix [Furosemide] Shortness Of Breath, Swelling and Anxiety    REVIEW OF SYSTEMS:   Review of Systems  Constitutional: Positive for diaphoresis. Negative for chills, fever and malaise/fatigue.  HENT: Negative for congestion, sinus pain and sore throat.   Eyes: Negative for blurred vision, double vision and photophobia.  Respiratory: Negative for cough, sputum production, shortness of breath and  wheezing.   Cardiovascular: Positive for chest pain (Left anterior chest and axillary). Negative for palpitations and leg swelling.  Gastrointestinal: Negative for abdominal pain, blood in stool, constipation, diarrhea, heartburn, nausea and vomiting.  Genitourinary: Negative for dysuria, flank pain and frequency.  Musculoskeletal: Negative for falls, joint pain and myalgias.  Skin: Negative.  Negative for itching and rash.  Neurological: Negative for dizziness, loss of consciousness, weakness and headaches.  Psychiatric/Behavioral: Negative.  Negative for depression.    MEDICATIONS AT HOME:   Prior to Admission medications   Medication Sig Start Date End Date Taking? Authorizing Provider  amLODipine (NORVASC) 10 MG tablet Take 1 tablet (10 mg total) by mouth every evening. 07/10/17  Yes Awilda Bill, NP  losartan (COZAAR) 100 MG tablet Take 1 tablet (100 mg total) by mouth every evening. 09/04/17  Yes Gladstone Lighter, MD  metoprolol succinate (TOPROL-XL) 25 MG 24 hr tablet Take 25 mg by mouth daily. 12/15/18  Yes [provider]  sertraline (ZOLOFT) 25 MG tablet Take 1 tablet (25 mg total) by mouth daily. 03/23/19 06/21/19 Yes Trinna Post, PA-C  sucroferric oxyhydroxide (VELPHORO) 500 MG chewable tablet Chew 500 mg by mouth 4 (four) times daily.   Yes [provider]  diphenhydrAMINE (BENADRYL) 25 mg capsule Take by mouth. 03/23/19 04/02/19  [provider]  minoxidil (LONITEN) 10 MG tablet Take 10 mg by mouth 2 (two) times daily with a meal.     [provider]      VITAL SIGNS:  Blood pressure (!) 179/137, pulse 89, resp. rate (!) 26, height 6\' 1"  (1.854 m), weight 111.1 kg, SpO2 95 %.  PHYSICAL EXAMINATION:  Physical Exam  GENERAL:  33 y.o.-year-old patient lying in the bed with no acute distress.  EYES: Pupils equal, round, reactive to light and accommodation. No scleral icterus. Extraocular muscles intact.  HEENT: Head atraumatic,  normocephalic. Oropharynx and nasopharynx clear.  NECK:  Supple, no jugular venous distention. No thyroid enlargement, no tenderness.  LUNGS: Normal breath sounds bilaterally, no wheezing, rales,rhonchi or crepitation. No use of accessory muscles of respiration.  CARDIOVASCULAR: Regular rate and rhythm, S1, S2 normal. No murmurs, rubs, or gallops.  ABDOMEN: Soft, nondistended, nontender. Bowel sounds present. No organomegaly or mass.  EXTREMITIES: No pedal edema, cyanosis, or clubbing. Left forearm AV fistula NEUROLOGIC: Cranial nerves II through XII are intact. Muscle strength 5/5 in all extremities. Sensation intact. Gait not checked.  PSYCHIATRIC: The patient is alert and oriented x 3.  Normal affect and good eye contact. SKIN: No obvious rash, lesion, or ulcer.   LABORATORY PANEL:   CBC Recent Labs  Lab 05/20/19 2126  WBC 7.2  HGB 9.1*  HCT 27.8*  PLT 144*   ------------------------------------------------------------------------------------------------------------------  Chemistries  Recent Labs  Lab 05/17/19 1256 05/20/19 2126  NA 138 137  K 3.3* 3.4*  CL 93* 95*  CO2 32 32  GLUCOSE 107* 129*  BUN  28* 39*  CREATININE 7.87* 10.16*  CALCIUM 8.9 9.3  AST 21  --   ALT 16  --   ALKPHOS 48  --   BILITOT 0.5  --    ------------------------------------------------------------------------------------------------------------------  Cardiac Enzymes No results for input(s): TROPONINI in the last 168 hours. ------------------------------------------------------------------------------------------------------------------  RADIOLOGY:  Dg Chest Port 1 View  Result Date: 05/20/2019 CLINICAL DATA:  Left chest pain, shortness of breath EXAM: PORTABLE CHEST 1 VIEW COMPARISON:  11/17/2018 FINDINGS: Cardiomegaly, vascular congestion. No confluent opacities or effusions. No overt edema. No acute bony abnormality. IMPRESSION: Cardiomegaly, vascular congestion. Electronically Signed    By: Rolm Baptise M.D.   On: 05/20/2019 21:58      IMPRESSION AND PLAN:   1. chest pain - We will continue to trend troponin levels -EKG in the a.m. -Echocardiogram -Cardiology consultation with Dr. Rockey Situ for further evaluation and recommendations given patient's history of MI at age 39 years old.  He is followed by Dr. Clayborn Bigness. -He is on telemetry monitoring-repeat CBC and BMP in the a.m.  2.  Hypertension --Home medications have been restarted including Norvasc, losartan, metoprolol, and minoxidil -We will treat persistent hypertension expectantly -Telemetry monitoring  3.  End-stage renal disease on hemodialysis - Patient dialyzes on Monday, Wednesday, Friday and follows with Dr. Moshe Cipro - Nephrology consulted for hemodialysis support  4.  Depression - Zoloft restarted  DVT and PPI prophylaxis initiated    All the records are reviewed and case discussed with ED provider. The plan of care was discussed in details with the patient (and family). I answered all questions. The patient agreed to proceed with the above mentioned plan. Further management will depend upon hospital course.   CODE STATUS: Full code  TOTAL TIME TAKING CARE OF THIS PATIENT: 45 minutes.    [place] on 05/21/2019 at 1:50 AM  Pager - [phone]  After 6pm go to [email or web address] - password [email or web address] [facility]  [facility]  Office  [phone]  CC: Primary care physician; Trinna Post, PA-C   Note: This dictation was prepared with Dragon dictation along with smaller phrase technology. Any transcriptional errors that result from this process are unintentional.

## 2019-05-21 NOTE — ED Notes (Signed)
.. ED TO INPATIENT HANDOFF REPORT  ED Nurse Name and Phone #: Deneise Lever [phone]  Y Name/Age/Gender Alan Gross 33 y.o. male Room/Bed: ED25A/ED25A  Code Status   Code Status: Prior  Home/SNF/Other Home Patient oriented to: self, place, time and situation Is this baseline? Yes   Triage Complete: Triage complete  Chief Complaint chest pain (EMS)  Triage Note Pt from home via [place]. Per EMS, pt was laying down when he felt a sudden left sided CP; sweating, SHOB.  Pt st he is on dyalisis (last dyalisis on Tuesday). Per EMS : 200/116 96 %on RA. Given in route by AEMS; 325 aspirin; 2 SB nitroglycerin; 0.27mcg Fentanyl. . EDP Quale  at bedside.    Allergies Allergies  Allergen Reactions  . Bee Venom Anaphylaxis  . Lasix [Furosemide] Shortness Of Breath, Swelling and Anxiety    Level of Care/Admitting Diagnosis ED Disposition    ED Disposition Condition Comment   Admit  The patient appears reasonably stabilized for admission considering the current resources, flow, and capabilities available in the ED at this time, and I doubt any other [facility] requiring further screening and/or treatment in the ED prior to admission is  present.       B Medical/Surgery History Past Medical History:  Diagnosis Date  . Anxiety   . Depression   . Dyspnea   . ESRD (end stage renal disease) ([place])   . H/O cardiac catheterization   . History of febrile seizure   . Hypertension   . Irregular heart beats   . Kidney failure    M,W,F [facility] in [place]; 15 %   . 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 A/V FISTULAGRAM N/A 05/07/2017   Procedure: A/V Fistulagram;  Surgeon: Algernon Huxley, MD;  Location: [place] [facility];  Service: Cardiovascular;  Laterality: N/A;  . AV FISTULA PLACEMENT Left 09/10/2016   Procedure: LEFT ARTERIOVENOUS (AV) FISTULA CREATION;  Surgeon: Conrad [place], MD;  Location: [place];  Service: Vascular;  Laterality:  Left;  . CARDIAC CATHETERIZATION Left 05/02/2015   Procedure: Left Heart Cath and Coronary Angiography;  Surgeon: Dionisio David, MD;  Location: [place] [facility];  Service: Cardiovascular;  Laterality: Left;  . CORONARY ANGIOPLASTY    . PERIPHERAL VASCULAR CATHETERIZATION N/A 07/11/2016   Procedure: Dialysis/Perma Catheter Insertion;  Surgeon: Algernon Huxley, MD;  Location: [place] [facility];  Service: Cardiovascular;  Laterality: N/A;  . PERIPHERAL VASCULAR THROMBECTOMY Left 05/07/2017   Procedure: Peripheral Vascular Thrombectomy;  Surgeon: Algernon Huxley, MD;  Location: [place] [facility];  Service: Cardiovascular;  Laterality: Left;     A IV Location/Drains/Wounds Patient Lines/Drains/Airways Status   Active Line/Drains/Airways    Name:   Placement date:   Placement time:   Site:   Days:   Peripheral IV 05/20/19 Right Antecubital   05/20/19    2128    Antecubital   1   Fistula / Graft Left Forearm Arteriovenous fistula   09/10/16    0958    Forearm   983   Fistula / Graft Left Forearm Arteriovenous fistula   -    -    Forearm      Hemodialysis Catheter Right Double-lumen   07/11/16    1030    -   1044   Incision (Closed) 09/10/16 Arm Left   09/10/16    0956     983  Intake/Output Last 24 hours No intake or output data in the 24 hours ending 05/21/19 0044  Labs/Imaging Results for orders placed or performed during the hospital encounter of 05/20/19 (from the past 48 hour(s))  CBC     Status: Abnormal   Collection Time: 05/20/19  9:26 PM  Result Value Ref Range   WBC 7.2 4.0 - 10.5 K/uL   RBC 3.03 (L) 4.22 - 5.81 MIL/uL   Hemoglobin 9.1 (L) 13.0 - 17.0 g/dL   HCT 27.8 (L) 39.0 - 52.0 %   MCV 91.7 80.0 - 100.0 fL   MCH 30.0 26.0 - 34.0 pg   MCHC 32.7 30.0 - 36.0 g/dL   RDW 13.6 11.5 - 15.5 %   Platelets 144 (L) 150 - 400 K/uL   nRBC 0.0 0.0 - 0.2 %    Comment: Performed at [facility], [address] [place], [place] [address]  Basic metabolic panel      Status: Abnormal   Collection Time: 05/20/19  9:26 PM  Result Value Ref Range   Sodium 137 135 - 145 mmol/L   Potassium 3.4 (L) 3.5 - 5.1 mmol/L   Chloride 95 (L) 98 - 111 mmol/L   CO2 32 22 - 32 mmol/L   Glucose, Bld 129 (H) 70 - 99 mg/dL   BUN 39 (H) 6 - 20 mg/dL   Creatinine, Ser 10.16 (H) 0.61 - 1.24 mg/dL   Calcium 9.3 8.9 - 10.3 mg/dL   GFR calc non Af Amer 6 (L) >60 mL/min   GFR calc Af Amer 7 (L) >60 mL/min   Anion gap 10 5 - 15    Comment: Performed at [facility], [place], [place] [address]  Troponin I (High Sensitivity)     Status: Abnormal   Collection Time: 05/20/19  9:26 PM  Result Value Ref Range   Troponin I (High Sensitivity) 94 (H) <18 ng/L    Comment: (NOTE) Elevated high sensitivity troponin I (hsTnI) values and significant  changes across serial measurements may suggest ACS but many other  chronic and acute conditions are known to elevate hsTnI results.  Refer to the "Links" section for chest pain algorithms and additional  guidance. Performed at [facility], [address] [place], [place] [address]    Dg Chest Port 1 View  Result Date: 05/20/2019 CLINICAL DATA:  Left chest pain, shortness of breath EXAM: PORTABLE CHEST 1 VIEW COMPARISON:  11/17/2018 FINDINGS: Cardiomegaly, vascular congestion. No confluent opacities or effusions. No overt edema. No acute bony abnormality. IMPRESSION: Cardiomegaly, vascular congestion. Electronically Signed   By: Rolm Baptise M.D.   On: 05/20/2019 21:58    Pending Labs Unresulted Labs (From admission, onward)    Start     Ordered   05/20/19 2254  Urine Drug Screen, Qualitative ([place] only)  Once,   STAT     05/20/19 2253   05/20/19 2243  Novel Coronavirus,NAA,(SEND-OUT TO REF LAB - TAT 24-48 hrs); Hosp Order  (Asymptomatic Patients Labs)  ONCE - STAT,   STAT    Question:  Rule Out  Answer:  Yes   05/20/19 2242          Vitals/Pain Today's Vitals   05/20/19 2115 05/20/19 2117  05/20/19 2120 05/20/19 2308  BP:    (!) 174/110  Pulse:      Resp:      SpO2:   96%   Weight:  111.1 kg    Height:  6\' 1"  (1.854 m)  PainSc: 2        Isolation Precautions No active isolations  Medications Medications  nitroGLYCERIN (NITROGLYN) 2 % ointment 1 inch (1 inch Topical Given 05/20/19 2132)  amLODipine (NORVASC) tablet 10 mg (10 mg Oral Given 05/20/19 2308)    Mobility walks Low fall risk   Focused Assessments Cardiac Assessment Handoff:  Cardiac Rhythm: Normal sinus rhythm(at 82bpm) Lab Results  Component Value Date   CKTOTAL 451 (H) 09/14/2012   CKMB 4.8 (H) 09/14/2012   TROPONINI 0.19 (HH) 11/17/2018   No results found for: DDIMER Does the Patient currently have chest pain? No     R Recommendations: See Admitting Provider Note  Report given to:   Additional Notes:

## 2019-05-21 NOTE — ED Notes (Signed)
ED TO INPATIENT HANDOFF REPORT  ED Nurse Name and Phone #: Metta Clines [phone]  S Name/Age/Gender Alan Gross 33 y.o. male Room/Bed: ED25A/ED25A  Code Status   Code Status: Prior  Home/SNF/Other Home Patient oriented to: self, place, time and situation Is this baseline? Yes   Triage Complete: Triage complete  Chief Complaint chest pain (EMS)  Triage Note Pt from home via [place]. Per EMS, pt was laying down when he felt a sudden left sided CP; sweating, SHOB.  Pt st he is on dyalisis (last dyalisis on Tuesday). Per EMS : 200/116 96 %on RA. Given in route by AEMS; 325 aspirin; 2 SB nitroglycerin; 0.58mcg Fentanyl. . EDP Quale  at bedside.    Allergies Allergies  Allergen Reactions  . Bee Venom Anaphylaxis  . Lasix [Furosemide] Shortness Of Breath, Swelling and Anxiety    Level of Care/Admitting Diagnosis ED Disposition    ED Disposition Condition Comment   Admit  Hospital Area: [place] [[id number]]  Level of Care: Telemetry [5]  Covid Evaluation: Asymptomatic Screening Protocol (No Symptoms)  Diagnosis: Chest pain in adult [5102585]  Admitting Physician: Mayer Camel [[id number]]  Attending Physician: Mayer Camel [[id number]]  PT Class (Do Not Modify): Observation [104]  PT Acc Code (Do Not Modify): Observation [10022]       B Medical/Surgery History Past Medical History:  Diagnosis Date  . Anxiety   . Depression   . Dyspnea   . ESRD (end stage renal disease) ([place])   . H/O cardiac catheterization   . History of febrile seizure   . Hypertension   . Irregular heart beats   . Kidney failure    M,W,F [facility] in [place]; 15 %   . 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 A/V FISTULAGRAM N/A 05/07/2017   Procedure: A/V Fistulagram;  Surgeon: Algernon Huxley, MD;  Location: [place] [facility] LAB;  Service: Cardiovascular;  Laterality: N/A;  . AV FISTULA PLACEMENT Left 09/10/2016   Procedure: LEFT ARTERIOVENOUS (AV) FISTULA CREATION;  Surgeon: Conrad [place], MD;  Location: [place];  Service: Vascular;  Laterality: Left;  . CARDIAC CATHETERIZATION Left 05/02/2015   Procedure: Left Heart Cath and Coronary Angiography;  Surgeon: Dionisio David, MD;  Location: [place] CV [facility];  Service: Cardiovascular;  Laterality: Left;  . CORONARY ANGIOPLASTY    . PERIPHERAL VASCULAR CATHETERIZATION N/A 07/11/2016   Procedure: Dialysis/Perma Catheter Insertion;  Surgeon: Algernon Huxley, MD;  Location: [place] CV LAB;  Service: Cardiovascular;  Laterality: N/A;  . PERIPHERAL VASCULAR THROMBECTOMY Left 05/07/2017   Procedure: Peripheral Vascular Thrombectomy;  Surgeon: Algernon Huxley, MD;  Location: [place] [facility];  Service: Cardiovascular;  Laterality: Left;     A IV Location/Drains/Wounds Patient Lines/Drains/Airways Status   Active Line/Drains/Airways    Name:   Placement date:   Placement time:   Site:   Days:   Peripheral IV 05/20/19 Right Antecubital   05/20/19    2128    Antecubital   1   Fistula / Graft Left Forearm Arteriovenous fistula   09/10/16    0958    Forearm   983   Fistula / Graft Left Forearm Arteriovenous fistula   -    -    Forearm      Hemodialysis Catheter Right Double-lumen   07/11/16    1030    -   1044   Incision (Closed) 09/10/16  Arm Left   09/10/16    0956     983          Intake/Output Last 24 hours No intake or output data in the 24 hours ending 05/21/19 0218  Labs/Imaging Results for orders placed or performed during the hospital encounter of 05/20/19 (from the past 48 hour(s))  CBC     Status: Abnormal   Collection Time: 05/20/19  9:26 PM  Result Value Ref Range   WBC 7.2 4.0 - 10.5 K/uL   RBC 3.03 (L) 4.22 - 5.81 MIL/uL   Hemoglobin 9.1 (L) 13.0 - 17.0 g/dL   HCT 27.8 (L) 39.0 - 52.0 %   MCV 91.7 80.0 - 100.0 fL   MCH 30.0 26.0 - 34.0 pg   MCHC 32.7 30.0 - 36.0 g/dL   RDW 13.6 11.5 - 15.5 %   Platelets 144 (L) 150 - 400 K/uL   nRBC  0.0 0.0 - 0.2 %    Comment: Performed at [facility], [address] [place], [place] [address]  Basic metabolic panel     Status: Abnormal   Collection Time: 05/20/19  9:26 PM  Result Value Ref Range   Sodium 137 135 - 145 mmol/L   Potassium 3.4 (L) 3.5 - 5.1 mmol/L   Chloride 95 (L) 98 - 111 mmol/L   CO2 32 22 - 32 mmol/L   Glucose, Bld 129 (H) 70 - 99 mg/dL   BUN 39 (H) 6 - 20 mg/dL   Creatinine, Ser 10.16 (H) 0.61 - 1.24 mg/dL   Calcium 9.3 8.9 - 10.3 mg/dL   GFR calc non Af Amer 6 (L) >60 mL/min   GFR calc Af Amer 7 (L) >60 mL/min   Anion gap 10 5 - 15    Comment: Performed at [facility], [place], [place] [address]  Troponin I (High Sensitivity)     Status: Abnormal   Collection Time: 05/20/19  9:26 PM  Result Value Ref Range   Troponin I (High Sensitivity) 94 (H) <18 ng/L    Comment: (NOTE) Elevated high sensitivity troponin I (hsTnI) values and significant  changes across serial measurements may suggest ACS but many other  chronic and acute conditions are known to elevate hsTnI results.  Refer to the "Links" section for chest pain algorithms and additional  guidance. Performed at [facility], [address] [place],  [address]    Dg Chest Port 1 View  Result Date: 05/20/2019 CLINICAL DATA:  Left chest pain, shortness of breath EXAM: PORTABLE CHEST 1 VIEW COMPARISON:  11/17/2018 FINDINGS: Cardiomegaly, vascular congestion. No confluent opacities or effusions. No overt edema. No acute bony abnormality. IMPRESSION: Cardiomegaly, vascular congestion. Electronically Signed   By: Rolm Baptise M.D.   On: 05/20/2019 21:58    Pending Labs Unresulted Labs (From admission, onward)    Start     Ordered   05/20/19 2254  Urine Drug Screen, Qualitative ([place] only)  Once,   STAT     05/20/19 2253   05/20/19 2243  Novel Coronavirus,NAA,(SEND-OUT TO REF LAB - TAT 24-48 hrs); Hosp Order  (Asymptomatic Patients Labs)  ONCE - STAT,    STAT    Question:  Rule Out  Answer:  Yes   05/20/19 2242   Signed and Held  CBC  (heparin)  Once,   R    Comments: Baseline for heparin therapy IF NOT ALREADY DRAWN.  Notify MD if PLT < 100 K.    Signed  and Held   Signed and Held  Creatinine, serum  (heparin)  Once,   R    Comments: Baseline for heparin therapy IF NOT ALREADY DRAWN.    Signed and Held   Signed and Held  Troponin I (High Sensitivity)  STAT Now then every 2 hours,   STAT     Signed and Held   Signed and Held  Lipid panel  Once,   R     Signed and Held          Vitals/Pain Today's Vitals   05/21/19 0155 05/21/19 0156 05/21/19 0156 05/21/19 0157  BP:      Pulse: 78 79  86  Resp:  (!) 23  19  SpO2: 95% 96%  97%  Weight:      Height:      PainSc:   0-No pain     Isolation Precautions No active isolations  Medications Medications  nitroGLYCERIN (NITROGLYN) 2 % ointment 1 inch (1 inch Topical Given 05/20/19 2132)  amLODipine (NORVASC) tablet 10 mg (10 mg Oral Given 05/20/19 2308)    Mobility walks Low fall risk   Focused Assessments Cardiac Assessment Handoff:  Cardiac Rhythm: Normal sinus rhythm(at 82bpm) Lab Results  Component Value Date   CKTOTAL 451 (H) 09/14/2012   CKMB 4.8 (H) 09/14/2012   TROPONINI 0.19 (HH) 11/17/2018   No results found for: DDIMER Does the Patient currently have chest pain? No     R Recommendations: See Admitting Provider Note  Report given to:   Additional Notes:  History of CVA/MI; denies CP currently

## 2019-05-21 NOTE — ED Notes (Signed)
This RN contacted admitting MD as pt wishes to leave AMA. Pt educated why it is extremely important that he stay d/t MI/HF history. Pt not currently in chest pain. States he has dialysis tomorrow and states he is also so uncomfortable with all of the vital sign wires on that he can't sleep. Pt notified dialysis set up for tomorrow. Pt still wishes to leave. Provider notified.

## 2019-05-21 NOTE — ED Notes (Signed)
Pt refusing urine sample at this time. Admitting doctor aware. NP Seals at bedside now with pt.

## 2019-05-24 DIAGNOSIS — E118 Type 2 diabetes mellitus with unspecified complications: Secondary | ICD-10-CM | POA: Diagnosis not present

## 2019-05-24 DIAGNOSIS — N2581 Secondary hyperparathyroidism of renal origin: Secondary | ICD-10-CM | POA: Diagnosis not present

## 2019-05-24 DIAGNOSIS — D649 Anemia, unspecified: Secondary | ICD-10-CM | POA: Diagnosis not present

## 2019-05-24 DIAGNOSIS — D509 Iron deficiency anemia, unspecified: Secondary | ICD-10-CM | POA: Diagnosis not present

## 2019-05-24 DIAGNOSIS — N186 End stage renal disease: Secondary | ICD-10-CM | POA: Diagnosis not present

## 2019-05-26 DIAGNOSIS — D649 Anemia, unspecified: Secondary | ICD-10-CM | POA: Diagnosis not present

## 2019-05-26 DIAGNOSIS — N186 End stage renal disease: Secondary | ICD-10-CM | POA: Diagnosis not present

## 2019-05-26 DIAGNOSIS — D509 Iron deficiency anemia, unspecified: Secondary | ICD-10-CM | POA: Diagnosis not present

## 2019-05-26 DIAGNOSIS — E118 Type 2 diabetes mellitus with unspecified complications: Secondary | ICD-10-CM | POA: Diagnosis not present

## 2019-05-26 DIAGNOSIS — N2581 Secondary hyperparathyroidism of renal origin: Secondary | ICD-10-CM | POA: Diagnosis not present

## 2019-05-27 LAB — NOVEL CORONAVIRUS, NAA (HOSP ORDER, SEND-OUT TO REF LAB; TAT 18-24 HRS): SARS-CoV-2, NAA: NOT DETECTED

## 2019-06-02 DIAGNOSIS — N186 End stage renal disease: Secondary | ICD-10-CM | POA: Diagnosis not present

## 2019-06-02 DIAGNOSIS — E118 Type 2 diabetes mellitus with unspecified complications: Secondary | ICD-10-CM | POA: Diagnosis not present

## 2019-06-02 DIAGNOSIS — D649 Anemia, unspecified: Secondary | ICD-10-CM | POA: Diagnosis not present

## 2019-06-02 DIAGNOSIS — N2581 Secondary hyperparathyroidism of renal origin: Secondary | ICD-10-CM | POA: Diagnosis not present

## 2019-06-02 DIAGNOSIS — D509 Iron deficiency anemia, unspecified: Secondary | ICD-10-CM | POA: Diagnosis not present

## 2019-06-03 ENCOUNTER — Encounter: Payer: Self-pay | Admitting: Gastroenterology

## 2019-06-03 ENCOUNTER — Ambulatory Visit: Payer: Medicare Other | Admitting: Gastroenterology

## 2019-06-04 DIAGNOSIS — E118 Type 2 diabetes mellitus with unspecified complications: Secondary | ICD-10-CM | POA: Diagnosis not present

## 2019-06-04 DIAGNOSIS — D509 Iron deficiency anemia, unspecified: Secondary | ICD-10-CM | POA: Diagnosis not present

## 2019-06-04 DIAGNOSIS — N2581 Secondary hyperparathyroidism of renal origin: Secondary | ICD-10-CM | POA: Diagnosis not present

## 2019-06-04 DIAGNOSIS — D649 Anemia, unspecified: Secondary | ICD-10-CM | POA: Diagnosis not present

## 2019-06-04 DIAGNOSIS — N186 End stage renal disease: Secondary | ICD-10-CM | POA: Diagnosis not present

## 2019-06-07 DIAGNOSIS — N2581 Secondary hyperparathyroidism of renal origin: Secondary | ICD-10-CM | POA: Diagnosis not present

## 2019-06-07 DIAGNOSIS — E118 Type 2 diabetes mellitus with unspecified complications: Secondary | ICD-10-CM | POA: Diagnosis not present

## 2019-06-07 DIAGNOSIS — R002 Palpitations: Secondary | ICD-10-CM | POA: Diagnosis not present

## 2019-06-07 DIAGNOSIS — N186 End stage renal disease: Secondary | ICD-10-CM | POA: Diagnosis not present

## 2019-06-07 DIAGNOSIS — D509 Iron deficiency anemia, unspecified: Secondary | ICD-10-CM | POA: Diagnosis not present

## 2019-06-07 DIAGNOSIS — D649 Anemia, unspecified: Secondary | ICD-10-CM | POA: Diagnosis not present

## 2019-06-09 DIAGNOSIS — D509 Iron deficiency anemia, unspecified: Secondary | ICD-10-CM | POA: Diagnosis not present

## 2019-06-09 DIAGNOSIS — D649 Anemia, unspecified: Secondary | ICD-10-CM | POA: Diagnosis not present

## 2019-06-09 DIAGNOSIS — N186 End stage renal disease: Secondary | ICD-10-CM | POA: Diagnosis not present

## 2019-06-09 DIAGNOSIS — N2581 Secondary hyperparathyroidism of renal origin: Secondary | ICD-10-CM | POA: Diagnosis not present

## 2019-06-09 DIAGNOSIS — E118 Type 2 diabetes mellitus with unspecified complications: Secondary | ICD-10-CM | POA: Diagnosis not present

## 2019-06-11 DIAGNOSIS — N186 End stage renal disease: Secondary | ICD-10-CM | POA: Diagnosis not present

## 2019-06-11 DIAGNOSIS — D649 Anemia, unspecified: Secondary | ICD-10-CM | POA: Diagnosis not present

## 2019-06-11 DIAGNOSIS — E118 Type 2 diabetes mellitus with unspecified complications: Secondary | ICD-10-CM | POA: Diagnosis not present

## 2019-06-11 DIAGNOSIS — N2581 Secondary hyperparathyroidism of renal origin: Secondary | ICD-10-CM | POA: Diagnosis not present

## 2019-06-11 DIAGNOSIS — D509 Iron deficiency anemia, unspecified: Secondary | ICD-10-CM | POA: Diagnosis not present

## 2019-06-14 DIAGNOSIS — N2581 Secondary hyperparathyroidism of renal origin: Secondary | ICD-10-CM | POA: Diagnosis not present

## 2019-06-14 DIAGNOSIS — N186 End stage renal disease: Secondary | ICD-10-CM | POA: Diagnosis not present

## 2019-06-14 DIAGNOSIS — D649 Anemia, unspecified: Secondary | ICD-10-CM | POA: Diagnosis not present

## 2019-06-14 DIAGNOSIS — D509 Iron deficiency anemia, unspecified: Secondary | ICD-10-CM | POA: Diagnosis not present

## 2019-06-14 DIAGNOSIS — E118 Type 2 diabetes mellitus with unspecified complications: Secondary | ICD-10-CM | POA: Diagnosis not present

## 2019-06-16 DIAGNOSIS — D649 Anemia, unspecified: Secondary | ICD-10-CM | POA: Diagnosis not present

## 2019-06-16 DIAGNOSIS — E118 Type 2 diabetes mellitus with unspecified complications: Secondary | ICD-10-CM | POA: Diagnosis not present

## 2019-06-16 DIAGNOSIS — N2581 Secondary hyperparathyroidism of renal origin: Secondary | ICD-10-CM | POA: Diagnosis not present

## 2019-06-16 DIAGNOSIS — N186 End stage renal disease: Secondary | ICD-10-CM | POA: Diagnosis not present

## 2019-06-16 DIAGNOSIS — D509 Iron deficiency anemia, unspecified: Secondary | ICD-10-CM | POA: Diagnosis not present

## 2019-06-18 DIAGNOSIS — N186 End stage renal disease: Secondary | ICD-10-CM | POA: Diagnosis not present

## 2019-06-18 DIAGNOSIS — D649 Anemia, unspecified: Secondary | ICD-10-CM | POA: Diagnosis not present

## 2019-06-18 DIAGNOSIS — D509 Iron deficiency anemia, unspecified: Secondary | ICD-10-CM | POA: Diagnosis not present

## 2019-06-18 DIAGNOSIS — E118 Type 2 diabetes mellitus with unspecified complications: Secondary | ICD-10-CM | POA: Diagnosis not present

## 2019-06-18 DIAGNOSIS — N2581 Secondary hyperparathyroidism of renal origin: Secondary | ICD-10-CM | POA: Diagnosis not present

## 2019-06-19 DIAGNOSIS — Z992 Dependence on renal dialysis: Secondary | ICD-10-CM | POA: Diagnosis not present

## 2019-06-19 DIAGNOSIS — I129 Hypertensive chronic kidney disease with stage 1 through stage 4 chronic kidney disease, or unspecified chronic kidney disease: Secondary | ICD-10-CM | POA: Diagnosis not present

## 2019-06-19 DIAGNOSIS — N186 End stage renal disease: Secondary | ICD-10-CM | POA: Diagnosis not present

## 2019-06-21 ENCOUNTER — Other Ambulatory Visit: Payer: Self-pay

## 2019-06-21 ENCOUNTER — Encounter: Payer: Self-pay | Admitting: Gastroenterology

## 2019-06-21 ENCOUNTER — Ambulatory Visit (INDEPENDENT_AMBULATORY_CARE_PROVIDER_SITE_OTHER): Payer: Medicare Other | Admitting: Gastroenterology

## 2019-06-21 VITALS — BP 213/124 | HR 78 | Temp 99.0°F | Ht 74.0 in | Wt 234.0 lb

## 2019-06-21 DIAGNOSIS — D649 Anemia, unspecified: Secondary | ICD-10-CM

## 2019-06-21 DIAGNOSIS — N186 End stage renal disease: Secondary | ICD-10-CM | POA: Diagnosis not present

## 2019-06-21 DIAGNOSIS — R112 Nausea with vomiting, unspecified: Secondary | ICD-10-CM

## 2019-06-21 DIAGNOSIS — K625 Hemorrhage of anus and rectum: Secondary | ICD-10-CM

## 2019-06-21 DIAGNOSIS — N2581 Secondary hyperparathyroidism of renal origin: Secondary | ICD-10-CM | POA: Diagnosis not present

## 2019-06-21 DIAGNOSIS — Z992 Dependence on renal dialysis: Secondary | ICD-10-CM | POA: Diagnosis not present

## 2019-06-21 DIAGNOSIS — E118 Type 2 diabetes mellitus with unspecified complications: Secondary | ICD-10-CM | POA: Diagnosis not present

## 2019-06-21 DIAGNOSIS — D509 Iron deficiency anemia, unspecified: Secondary | ICD-10-CM | POA: Diagnosis not present

## 2019-06-21 NOTE — Progress Notes (Signed)
Alan Antigua, MD [address]  [place]  [place], [place] [address]  Main: [phone]  Fax: [phone]   Primary Care Physician: Trinna Post, [place]   Chief complaint: Nausea vomiting  HPI: Alan Gross is a 33 y.o. male previously seen in January 2020 for intermittent nausea and vomiting and intermittent blood in stool.  EGD and colonoscopy recommended at that time and cardiac clearance was requested and obtained.  However, procedure was delayed due to COVID-19.  In addition, patient has had high blood pressures as he is not compliant with his daily blood pressure medications and has had recent ER visits with elevated blood pressures and troponin.  Patient has a cardiology appointment scheduled in 2 days.  Patient continues to report intermittent nausea and vomiting.  No hematemesis.  Has not had any red stool in months.  Does report one episode of dark black stool 3 weeks ago, which was an isolated episode.  Most recent hemoglobin on July 2 showed a hemoglobin of 9.1 which is around his baseline.  Current Outpatient Medications  Medication Sig Dispense Refill  . amLODipine (NORVASC) 10 MG tablet Take 1 tablet (10 mg total) by mouth every evening. 30 tablet 1  . diphenhydrAMINE (BENADRYL) 25 mg capsule Take by mouth.    . losartan (COZAAR) 100 MG tablet Take 1 tablet (100 mg total) by mouth every evening. 30 tablet 1  . metoprolol succinate (TOPROL-XL) 25 MG 24 hr tablet Take 25 mg by mouth daily.    . minoxidil (LONITEN) 10 MG tablet Take 10 mg by mouth 2 (two) times daily with a meal.   3  . sertraline (ZOLOFT) 25 MG tablet Take 1 tablet (25 mg total) by mouth daily. 90 tablet 0  . sucroferric oxyhydroxide (VELPHORO) 500 MG chewable tablet Chew 500 mg by mouth 4 (four) times daily.     No current facility-administered medications for this visit.     Allergies as of 06/21/2019 - Review Complete 05/20/2019  Allergen Reaction Noted  . Bee venom Anaphylaxis  03/29/2018  . Lasix [furosemide] Shortness Of Breath, Swelling, and Anxiety 04/01/2017    ROS:  General: Negative for anorexia, weight loss, fever, chills, fatigue, weakness. ENT: Negative for hoarseness, difficulty swallowing , nasal congestion. CV: Negative for chest pain, angina, palpitations, dyspnea on exertion, peripheral edema.  Respiratory: Negative for dyspnea at rest, dyspnea on exertion, cough, sputum, wheezing.  GI: See history of present illness. GU:  Negative for dysuria, hematuria, urinary incontinence, urinary frequency, nocturnal urination.  Endo: Negative for unusual weight change.    Physical Examination:   There were no vitals taken for this visit.  General: Well-nourished, well-developed in no acute distress.  Eyes: No icterus. Conjunctivae pink. Mouth: Oropharyngeal mucosa moist and pink , no lesions erythema or exudate. Neck: Supple, Trachea midline Abdomen: Bowel sounds are normal, nontender, nondistended, no hepatosplenomegaly or masses, no abdominal bruits or hernia , no rebound or guarding.   Extremities: No lower extremity edema. No clubbing or deformities. Neuro: Alert and oriented x 3.  Grossly intact. Skin: Warm and dry, no jaundice.   Psych: Alert and cooperative, normal mood and affect.   Labs: CMP     Component Value Date/Time   NA 137 05/20/2019 2126   NA 138 11/19/2018 1527   NA 139 02/14/2015 1944   K 3.4 (L) 05/20/2019 2126   K 3.4 (L) 02/14/2015 1944   CL 95 (L) 05/20/2019 2126   CL 101 02/14/2015 1944  CO2 32 05/20/2019 2126   CO2 32 02/14/2015 1944   GLUCOSE 129 (H) 05/20/2019 2126   GLUCOSE 105 (H) 02/14/2015 1944   BUN 39 (H) 05/20/2019 2126   BUN 65 (H) 11/19/2018 1527   BUN 13 02/14/2015 1944   CREATININE 10.16 (H) 05/20/2019 2126   CREATININE 1.12 02/14/2015 1944   CALCIUM 9.3 05/20/2019 2126   CALCIUM 9.4 02/14/2015 1944   PROT 7.2 05/17/2019 1256   PROT 5.9 (L) 11/19/2018 1527   PROT 7.2 10/14/2013 1959   ALBUMIN  4.0 05/17/2019 1256   ALBUMIN 3.9 11/19/2018 1527   ALBUMIN 4.0 10/14/2013 1959   AST 21 05/17/2019 1256   AST 26 10/14/2013 1959   ALT 16 05/17/2019 1256   ALT 31 10/14/2013 1959   ALKPHOS 48 05/17/2019 1256   ALKPHOS 73 10/14/2013 1959   BILITOT 0.5 05/17/2019 1256   BILITOT 0.5 11/19/2018 1527   BILITOT 0.2 10/14/2013 1959   GFRNONAA 6 (L) 05/20/2019 2126   GFRNONAA >60 02/14/2015 1944   GFRAA 7 (L) 05/20/2019 2126   GFRAA >60 02/14/2015 1944   Lab Results  Component Value Date   WBC 7.2 05/20/2019   HGB 9.1 (L) 05/20/2019   HCT 27.8 (L) 05/20/2019   MCV 91.7 05/20/2019   PLT 144 (L) 05/20/2019    Imaging Studies: No results found.  Assessment and Plan:   Alan Gross is a 33 y.o. y/o male with intermittent nausea and vomiting and intermittent blood in stool  Blood in stool has resolved Most likely hemorrhoids  However, patient is chronically anemic, likely due to ESRD  However, patient has never had an EGD and colonoscopy and has intermittent nausea vomiting or blood in stool, and anemia therefore procedures are indicated to rule out any underlying lesions  We will need to obtain cardiac clearance again given his elevated blood pressures and recent ER visit for chest pain  Patient has an appointment with Dr. Clayborn Bigness in 2 days and he was asked to get his clearance done at the same time  Procedures will be scheduled and done as long as cardiology clearance is obtained prior to the procedures  I have discussed alternative options, risks & benefits,  which include, but are not limited to, bleeding, infection, perforation,respiratory complication & drug reaction.  The patient agrees with this plan & written consent will be obtained.    I have asked him to go to the ER due to his elevated blood pressure and discussed the risks of stroke or MI or other complications with such elevated blood pressures and noncompliance to his medications.  He refuses going to the ER.   His family is also at bedside with him.  I have asked him to follow-up with his PCP in this regard.  I have messaged his PCP personally on epic messaging, Carles Collet.  She states patient is noncompliant with his care as well.  Patient states he will go home and take his blood pressure medications.  I have asked him to repeat his blood pressure at home after taking the medications and recheck to his PCP if it is not better.  However, I have urged him to go to the ER if his blood pressure does not improve or if he develops any symptoms   Dr Alan Gross

## 2019-06-21 NOTE — Addendum Note (Signed)
Addended by: Earl Lagos on: 06/21/2019 04:31 PM   Modules accepted: Orders, SmartSet

## 2019-06-23 DIAGNOSIS — Z992 Dependence on renal dialysis: Secondary | ICD-10-CM | POA: Diagnosis not present

## 2019-06-23 DIAGNOSIS — N186 End stage renal disease: Secondary | ICD-10-CM | POA: Diagnosis not present

## 2019-06-23 DIAGNOSIS — E118 Type 2 diabetes mellitus with unspecified complications: Secondary | ICD-10-CM | POA: Diagnosis not present

## 2019-06-23 DIAGNOSIS — D509 Iron deficiency anemia, unspecified: Secondary | ICD-10-CM | POA: Diagnosis not present

## 2019-06-23 DIAGNOSIS — N2581 Secondary hyperparathyroidism of renal origin: Secondary | ICD-10-CM | POA: Diagnosis not present

## 2019-06-28 DIAGNOSIS — N186 End stage renal disease: Secondary | ICD-10-CM | POA: Diagnosis not present

## 2019-06-28 DIAGNOSIS — E118 Type 2 diabetes mellitus with unspecified complications: Secondary | ICD-10-CM | POA: Diagnosis not present

## 2019-06-28 DIAGNOSIS — Z992 Dependence on renal dialysis: Secondary | ICD-10-CM | POA: Diagnosis not present

## 2019-06-28 DIAGNOSIS — D509 Iron deficiency anemia, unspecified: Secondary | ICD-10-CM | POA: Diagnosis not present

## 2019-06-28 DIAGNOSIS — N2581 Secondary hyperparathyroidism of renal origin: Secondary | ICD-10-CM | POA: Diagnosis not present

## 2019-06-30 DIAGNOSIS — E118 Type 2 diabetes mellitus with unspecified complications: Secondary | ICD-10-CM | POA: Diagnosis not present

## 2019-06-30 DIAGNOSIS — N186 End stage renal disease: Secondary | ICD-10-CM | POA: Diagnosis not present

## 2019-06-30 DIAGNOSIS — D509 Iron deficiency anemia, unspecified: Secondary | ICD-10-CM | POA: Diagnosis not present

## 2019-06-30 DIAGNOSIS — N2581 Secondary hyperparathyroidism of renal origin: Secondary | ICD-10-CM | POA: Diagnosis not present

## 2019-06-30 DIAGNOSIS — Z992 Dependence on renal dialysis: Secondary | ICD-10-CM | POA: Diagnosis not present

## 2019-07-01 ENCOUNTER — Telehealth: Payer: Self-pay | Admitting: Gastroenterology

## 2019-07-01 NOTE — Telephone Encounter (Signed)
Called and left a message for call back. We refax clearance for to cardiology this morning.

## 2019-07-01 NOTE — Telephone Encounter (Signed)
Called patient and left a detail message informing patient that this was faxed to the cardiologist for the second time this morning and we will give him a call when we hear something back from them.

## 2019-07-01 NOTE — Telephone Encounter (Signed)
Pt left vm he is having a Procedure and need a form that is clearing him to have this procedure for his other doctor he would like a call regarding this

## 2019-07-02 ENCOUNTER — Telehealth: Payer: Self-pay | Admitting: Gastroenterology

## 2019-07-02 DIAGNOSIS — E118 Type 2 diabetes mellitus with unspecified complications: Secondary | ICD-10-CM | POA: Diagnosis not present

## 2019-07-02 DIAGNOSIS — Z992 Dependence on renal dialysis: Secondary | ICD-10-CM | POA: Diagnosis not present

## 2019-07-02 DIAGNOSIS — N2581 Secondary hyperparathyroidism of renal origin: Secondary | ICD-10-CM | POA: Diagnosis not present

## 2019-07-02 DIAGNOSIS — N186 End stage renal disease: Secondary | ICD-10-CM | POA: Diagnosis not present

## 2019-07-02 DIAGNOSIS — D509 Iron deficiency anemia, unspecified: Secondary | ICD-10-CM | POA: Diagnosis not present

## 2019-07-02 NOTE — Telephone Encounter (Signed)
Patient verbalized understanding that patient was cleared for surgery by his cardiologist. Patient states he never received his paper work for surgery In the mail. Informed patient we could email mail or he could pick up a copy. Patient states he will pick up a copy.

## 2019-07-05 ENCOUNTER — Other Ambulatory Visit: Payer: Medicare Other | Attending: Gastroenterology

## 2019-07-05 DIAGNOSIS — D509 Iron deficiency anemia, unspecified: Secondary | ICD-10-CM | POA: Diagnosis not present

## 2019-07-05 DIAGNOSIS — N186 End stage renal disease: Secondary | ICD-10-CM | POA: Diagnosis not present

## 2019-07-05 DIAGNOSIS — N2581 Secondary hyperparathyroidism of renal origin: Secondary | ICD-10-CM | POA: Diagnosis not present

## 2019-07-05 DIAGNOSIS — Z992 Dependence on renal dialysis: Secondary | ICD-10-CM | POA: Diagnosis not present

## 2019-07-05 DIAGNOSIS — E118 Type 2 diabetes mellitus with unspecified complications: Secondary | ICD-10-CM | POA: Diagnosis not present

## 2019-07-07 ENCOUNTER — Other Ambulatory Visit: Payer: Self-pay

## 2019-07-07 ENCOUNTER — Encounter: Payer: Self-pay | Admitting: *Deleted

## 2019-07-07 DIAGNOSIS — R112 Nausea with vomiting, unspecified: Secondary | ICD-10-CM

## 2019-07-07 DIAGNOSIS — E118 Type 2 diabetes mellitus with unspecified complications: Secondary | ICD-10-CM | POA: Diagnosis not present

## 2019-07-07 DIAGNOSIS — N186 End stage renal disease: Secondary | ICD-10-CM | POA: Diagnosis not present

## 2019-07-07 DIAGNOSIS — D509 Iron deficiency anemia, unspecified: Secondary | ICD-10-CM | POA: Diagnosis not present

## 2019-07-07 DIAGNOSIS — Z992 Dependence on renal dialysis: Secondary | ICD-10-CM | POA: Diagnosis not present

## 2019-07-07 DIAGNOSIS — N2581 Secondary hyperparathyroidism of renal origin: Secondary | ICD-10-CM | POA: Diagnosis not present

## 2019-07-09 DIAGNOSIS — N2581 Secondary hyperparathyroidism of renal origin: Secondary | ICD-10-CM | POA: Diagnosis not present

## 2019-07-09 DIAGNOSIS — E118 Type 2 diabetes mellitus with unspecified complications: Secondary | ICD-10-CM | POA: Diagnosis not present

## 2019-07-09 DIAGNOSIS — Z992 Dependence on renal dialysis: Secondary | ICD-10-CM | POA: Diagnosis not present

## 2019-07-09 DIAGNOSIS — D509 Iron deficiency anemia, unspecified: Secondary | ICD-10-CM | POA: Diagnosis not present

## 2019-07-09 DIAGNOSIS — N186 End stage renal disease: Secondary | ICD-10-CM | POA: Diagnosis not present

## 2019-07-12 DIAGNOSIS — N186 End stage renal disease: Secondary | ICD-10-CM | POA: Diagnosis not present

## 2019-07-12 DIAGNOSIS — Z992 Dependence on renal dialysis: Secondary | ICD-10-CM | POA: Diagnosis not present

## 2019-07-12 DIAGNOSIS — D509 Iron deficiency anemia, unspecified: Secondary | ICD-10-CM | POA: Diagnosis not present

## 2019-07-12 DIAGNOSIS — E118 Type 2 diabetes mellitus with unspecified complications: Secondary | ICD-10-CM | POA: Diagnosis not present

## 2019-07-12 DIAGNOSIS — N2581 Secondary hyperparathyroidism of renal origin: Secondary | ICD-10-CM | POA: Diagnosis not present

## 2019-07-13 ENCOUNTER — Other Ambulatory Visit: Payer: Self-pay

## 2019-07-13 MED ORDER — NA SULFATE-K SULFATE-MG SULF 17.5-3.13-1.6 GM/177ML PO SOLN: 1.0000 | Freq: Once | ORAL | 0 refills | Status: AC

## 2019-07-16 DIAGNOSIS — D509 Iron deficiency anemia, unspecified: Secondary | ICD-10-CM | POA: Diagnosis not present

## 2019-07-16 DIAGNOSIS — Z992 Dependence on renal dialysis: Secondary | ICD-10-CM | POA: Diagnosis not present

## 2019-07-16 DIAGNOSIS — N2581 Secondary hyperparathyroidism of renal origin: Secondary | ICD-10-CM | POA: Diagnosis not present

## 2019-07-16 DIAGNOSIS — N186 End stage renal disease: Secondary | ICD-10-CM | POA: Diagnosis not present

## 2019-07-16 DIAGNOSIS — E118 Type 2 diabetes mellitus with unspecified complications: Secondary | ICD-10-CM | POA: Diagnosis not present

## 2019-07-19 DIAGNOSIS — N186 End stage renal disease: Secondary | ICD-10-CM | POA: Diagnosis not present

## 2019-07-19 DIAGNOSIS — Z992 Dependence on renal dialysis: Secondary | ICD-10-CM | POA: Diagnosis not present

## 2019-07-19 DIAGNOSIS — N2581 Secondary hyperparathyroidism of renal origin: Secondary | ICD-10-CM | POA: Diagnosis not present

## 2019-07-19 DIAGNOSIS — E118 Type 2 diabetes mellitus with unspecified complications: Secondary | ICD-10-CM | POA: Diagnosis not present

## 2019-07-19 DIAGNOSIS — D509 Iron deficiency anemia, unspecified: Secondary | ICD-10-CM | POA: Diagnosis not present

## 2019-07-20 ENCOUNTER — Telehealth: Payer: Self-pay

## 2019-07-20 DIAGNOSIS — N186 End stage renal disease: Secondary | ICD-10-CM | POA: Diagnosis not present

## 2019-07-20 DIAGNOSIS — Z992 Dependence on renal dialysis: Secondary | ICD-10-CM | POA: Diagnosis not present

## 2019-07-20 DIAGNOSIS — I129 Hypertensive chronic kidney disease with stage 1 through stage 4 chronic kidney disease, or unspecified chronic kidney disease: Secondary | ICD-10-CM | POA: Diagnosis not present

## 2019-07-20 NOTE — Telephone Encounter (Signed)
Called patient to inform patient that the [facility] center was going to be closed on Monday so he needed to go on 07/27/2019 for his covid test. Patient verbalized understanding.

## 2019-07-20 NOTE — Telephone Encounter (Signed)
Informed patient that is COVID test is moved to 07/27/19 instead of the 07/26/19

## 2019-07-21 ENCOUNTER — Telehealth: Payer: Self-pay | Admitting: Gastroenterology

## 2019-07-21 DIAGNOSIS — Z992 Dependence on renal dialysis: Secondary | ICD-10-CM | POA: Diagnosis not present

## 2019-07-21 DIAGNOSIS — D509 Iron deficiency anemia, unspecified: Secondary | ICD-10-CM | POA: Diagnosis not present

## 2019-07-21 DIAGNOSIS — N2581 Secondary hyperparathyroidism of renal origin: Secondary | ICD-10-CM | POA: Diagnosis not present

## 2019-07-21 DIAGNOSIS — D649 Anemia, unspecified: Secondary | ICD-10-CM | POA: Diagnosis not present

## 2019-07-21 DIAGNOSIS — E118 Type 2 diabetes mellitus with unspecified complications: Secondary | ICD-10-CM | POA: Diagnosis not present

## 2019-07-21 DIAGNOSIS — N186 End stage renal disease: Secondary | ICD-10-CM | POA: Diagnosis not present

## 2019-07-21 MED ORDER — NA SULFATE-K SULFATE-MG SULF 17.5-3.13-1.6 GM/177ML PO SOLN: 354.0000 mL | Freq: Once | ORAL | 0 refills | Status: AC

## 2019-07-21 NOTE — Telephone Encounter (Signed)
Pt is calling to obtain a prescription for his procedure on 07/29/19 please call pt

## 2019-07-21 NOTE — Telephone Encounter (Signed)
Sent suprep to the pharmacy and called patient to inform him

## 2019-07-23 DIAGNOSIS — N186 End stage renal disease: Secondary | ICD-10-CM | POA: Diagnosis not present

## 2019-07-23 DIAGNOSIS — E118 Type 2 diabetes mellitus with unspecified complications: Secondary | ICD-10-CM | POA: Diagnosis not present

## 2019-07-23 DIAGNOSIS — N2581 Secondary hyperparathyroidism of renal origin: Secondary | ICD-10-CM | POA: Diagnosis not present

## 2019-07-23 DIAGNOSIS — D649 Anemia, unspecified: Secondary | ICD-10-CM | POA: Diagnosis not present

## 2019-07-23 DIAGNOSIS — Z992 Dependence on renal dialysis: Secondary | ICD-10-CM | POA: Diagnosis not present

## 2019-07-23 DIAGNOSIS — D509 Iron deficiency anemia, unspecified: Secondary | ICD-10-CM | POA: Diagnosis not present

## 2019-07-26 DIAGNOSIS — N2581 Secondary hyperparathyroidism of renal origin: Secondary | ICD-10-CM | POA: Diagnosis not present

## 2019-07-26 DIAGNOSIS — E118 Type 2 diabetes mellitus with unspecified complications: Secondary | ICD-10-CM | POA: Diagnosis not present

## 2019-07-26 DIAGNOSIS — N186 End stage renal disease: Secondary | ICD-10-CM | POA: Diagnosis not present

## 2019-07-26 DIAGNOSIS — Z992 Dependence on renal dialysis: Secondary | ICD-10-CM | POA: Diagnosis not present

## 2019-07-26 DIAGNOSIS — D509 Iron deficiency anemia, unspecified: Secondary | ICD-10-CM | POA: Diagnosis not present

## 2019-07-26 DIAGNOSIS — D649 Anemia, unspecified: Secondary | ICD-10-CM | POA: Diagnosis not present

## 2019-07-27 ENCOUNTER — Other Ambulatory Visit: Admission: RE | Admit: 2019-07-27 | Payer: Medicare Other | Source: Ambulatory Visit

## 2019-07-28 ENCOUNTER — Telehealth: Payer: Self-pay

## 2019-07-28 DIAGNOSIS — N2581 Secondary hyperparathyroidism of renal origin: Secondary | ICD-10-CM | POA: Diagnosis not present

## 2019-07-28 DIAGNOSIS — D649 Anemia, unspecified: Secondary | ICD-10-CM | POA: Diagnosis not present

## 2019-07-28 DIAGNOSIS — N186 End stage renal disease: Secondary | ICD-10-CM | POA: Diagnosis not present

## 2019-07-28 DIAGNOSIS — Z992 Dependence on renal dialysis: Secondary | ICD-10-CM | POA: Diagnosis not present

## 2019-07-28 DIAGNOSIS — E118 Type 2 diabetes mellitus with unspecified complications: Secondary | ICD-10-CM | POA: Diagnosis not present

## 2019-07-28 DIAGNOSIS — D509 Iron deficiency anemia, unspecified: Secondary | ICD-10-CM | POA: Diagnosis not present

## 2019-07-28 NOTE — Telephone Encounter (Signed)
Patients COVID test was not done yesterday for him to have his colonoscopy with EGD scheduled with Dr. Bonna Gains.  He has been rescheduled for 08/05/19.  He did not pick up first set of instructions.  He said he would be by today to pick up instructions.  He has been advised to have COVID test on Monday September 14th.  Thanks [employer]

## 2019-07-29 ENCOUNTER — Telehealth: Payer: Self-pay

## 2019-07-29 ENCOUNTER — Other Ambulatory Visit: Payer: Self-pay

## 2019-07-29 NOTE — Telephone Encounter (Signed)
Returned Pt call to reschedule 9/17 colonoscopy. Colonoscopy has been rescheduled to 08/11/2019 at [facility] with Dr. Bonna Gains. Pt has been advised to please try to keep this appt because we limit reschedule/cancellations to 3 and this will be his third reschedule.   Thanks,  Sharyn Lull

## 2019-07-30 DIAGNOSIS — D649 Anemia, unspecified: Secondary | ICD-10-CM | POA: Diagnosis not present

## 2019-07-30 DIAGNOSIS — D509 Iron deficiency anemia, unspecified: Secondary | ICD-10-CM | POA: Diagnosis not present

## 2019-07-30 DIAGNOSIS — Z992 Dependence on renal dialysis: Secondary | ICD-10-CM | POA: Diagnosis not present

## 2019-07-30 DIAGNOSIS — E118 Type 2 diabetes mellitus with unspecified complications: Secondary | ICD-10-CM | POA: Diagnosis not present

## 2019-07-30 DIAGNOSIS — N186 End stage renal disease: Secondary | ICD-10-CM | POA: Diagnosis not present

## 2019-07-30 DIAGNOSIS — N2581 Secondary hyperparathyroidism of renal origin: Secondary | ICD-10-CM | POA: Diagnosis not present

## 2019-08-02 DIAGNOSIS — D649 Anemia, unspecified: Secondary | ICD-10-CM | POA: Diagnosis not present

## 2019-08-02 DIAGNOSIS — D509 Iron deficiency anemia, unspecified: Secondary | ICD-10-CM | POA: Diagnosis not present

## 2019-08-02 DIAGNOSIS — Z992 Dependence on renal dialysis: Secondary | ICD-10-CM | POA: Diagnosis not present

## 2019-08-02 DIAGNOSIS — E118 Type 2 diabetes mellitus with unspecified complications: Secondary | ICD-10-CM | POA: Diagnosis not present

## 2019-08-02 DIAGNOSIS — N186 End stage renal disease: Secondary | ICD-10-CM | POA: Diagnosis not present

## 2019-08-02 DIAGNOSIS — N2581 Secondary hyperparathyroidism of renal origin: Secondary | ICD-10-CM | POA: Diagnosis not present

## 2019-08-03 ENCOUNTER — Telehealth: Payer: Self-pay | Admitting: Gastroenterology

## 2019-08-03 NOTE — Telephone Encounter (Signed)
Please call pt to inform him the date and time of his Covid19 test

## 2019-08-03 NOTE — Telephone Encounter (Signed)
Patient procedure is scheduled on 08/11/2019. Informed patient his COVID test is on Friday the 18th. Patient states he has another appointment from 9-2:30 and will go after his appointment. Informed patient that was fine I would let the [facility] know

## 2019-08-03 NOTE — Telephone Encounter (Signed)
Informed the [facility] of this information

## 2019-08-04 DIAGNOSIS — N2581 Secondary hyperparathyroidism of renal origin: Secondary | ICD-10-CM | POA: Diagnosis not present

## 2019-08-04 DIAGNOSIS — N186 End stage renal disease: Secondary | ICD-10-CM | POA: Diagnosis not present

## 2019-08-04 DIAGNOSIS — Z992 Dependence on renal dialysis: Secondary | ICD-10-CM | POA: Diagnosis not present

## 2019-08-04 DIAGNOSIS — D649 Anemia, unspecified: Secondary | ICD-10-CM | POA: Diagnosis not present

## 2019-08-04 DIAGNOSIS — E118 Type 2 diabetes mellitus with unspecified complications: Secondary | ICD-10-CM | POA: Diagnosis not present

## 2019-08-04 DIAGNOSIS — D509 Iron deficiency anemia, unspecified: Secondary | ICD-10-CM | POA: Diagnosis not present

## 2019-08-06 ENCOUNTER — Other Ambulatory Visit: Payer: Self-pay

## 2019-08-06 ENCOUNTER — Other Ambulatory Visit
Admission: RE | Admit: 2019-08-06 | Discharge: 2019-08-06 | Disposition: A | Discharge status: Home / Self Care | Payer: Medicare Other | Source: Ambulatory Visit | Attending: Gastroenterology | Admitting: Gastroenterology

## 2019-08-06 DIAGNOSIS — E118 Type 2 diabetes mellitus with unspecified complications: Secondary | ICD-10-CM | POA: Diagnosis not present

## 2019-08-06 DIAGNOSIS — D509 Iron deficiency anemia, unspecified: Secondary | ICD-10-CM | POA: Diagnosis not present

## 2019-08-06 DIAGNOSIS — Z20828 Contact with and (suspected) exposure to other viral communicable diseases: Secondary | ICD-10-CM | POA: Diagnosis not present

## 2019-08-06 DIAGNOSIS — Z01812 Encounter for preprocedural laboratory examination: Secondary | ICD-10-CM | POA: Diagnosis not present

## 2019-08-06 DIAGNOSIS — N186 End stage renal disease: Secondary | ICD-10-CM | POA: Diagnosis not present

## 2019-08-06 DIAGNOSIS — D649 Anemia, unspecified: Secondary | ICD-10-CM | POA: Diagnosis not present

## 2019-08-06 DIAGNOSIS — N2581 Secondary hyperparathyroidism of renal origin: Secondary | ICD-10-CM | POA: Diagnosis not present

## 2019-08-06 DIAGNOSIS — Z992 Dependence on renal dialysis: Secondary | ICD-10-CM | POA: Diagnosis not present

## 2019-08-07 LAB — SARS CORONAVIRUS 2 (TAT 6-24 HRS): SARS Coronavirus 2: NEGATIVE

## 2019-08-09 DIAGNOSIS — E118 Type 2 diabetes mellitus with unspecified complications: Secondary | ICD-10-CM | POA: Diagnosis not present

## 2019-08-09 DIAGNOSIS — D509 Iron deficiency anemia, unspecified: Secondary | ICD-10-CM | POA: Diagnosis not present

## 2019-08-09 DIAGNOSIS — N2581 Secondary hyperparathyroidism of renal origin: Secondary | ICD-10-CM | POA: Diagnosis not present

## 2019-08-09 DIAGNOSIS — D649 Anemia, unspecified: Secondary | ICD-10-CM | POA: Diagnosis not present

## 2019-08-09 DIAGNOSIS — N186 End stage renal disease: Secondary | ICD-10-CM | POA: Diagnosis not present

## 2019-08-09 DIAGNOSIS — Z992 Dependence on renal dialysis: Secondary | ICD-10-CM | POA: Diagnosis not present

## 2019-08-10 ENCOUNTER — Telehealth: Payer: Self-pay | Admitting: Gastroenterology

## 2019-08-10 MED ORDER — PEG 3350-KCL-NA BICARB-NACL 420 G PO SOLR: 4000.0000 mL | Freq: Once | ORAL | 0 refills | Status: AC

## 2019-08-10 NOTE — Telephone Encounter (Signed)
Pt would like to reschedule  His procedure [facility] did not have his prep kit for the procedure stating they had to order it when I offered to send it to a different pharmacy he states he  Did not have time due to work. Please call to r/s pt is aware there may be $100 fee  For not rescheduling within 24hours

## 2019-08-10 NOTE — Telephone Encounter (Signed)
Sent patient goyltely to the pharmacy. Called and left a detail message for patient

## 2019-08-10 NOTE — Telephone Encounter (Signed)
Called endo and cancel the procedure

## 2019-08-10 NOTE — Telephone Encounter (Signed)
Pt is calling he needs his prep kit  Changed to something cheaper please call in this morning so he can pick it up to  [address]  °

## 2019-08-11 ENCOUNTER — Encounter: Admission: RE | Payer: Self-pay | Source: Home / Self Care

## 2019-08-11 ENCOUNTER — Ambulatory Visit: Admission: RE | Admit: 2019-08-11 | Payer: Medicare Other | Source: Home / Self Care | Admitting: Gastroenterology

## 2019-08-11 DIAGNOSIS — N2581 Secondary hyperparathyroidism of renal origin: Secondary | ICD-10-CM | POA: Diagnosis not present

## 2019-08-11 DIAGNOSIS — N186 End stage renal disease: Secondary | ICD-10-CM | POA: Diagnosis not present

## 2019-08-11 DIAGNOSIS — E118 Type 2 diabetes mellitus with unspecified complications: Secondary | ICD-10-CM | POA: Diagnosis not present

## 2019-08-11 DIAGNOSIS — Z992 Dependence on renal dialysis: Secondary | ICD-10-CM | POA: Diagnosis not present

## 2019-08-11 DIAGNOSIS — D649 Anemia, unspecified: Secondary | ICD-10-CM | POA: Diagnosis not present

## 2019-08-11 DIAGNOSIS — D509 Iron deficiency anemia, unspecified: Secondary | ICD-10-CM | POA: Diagnosis not present

## 2019-08-11 HISTORY — DX: Atherosclerotic heart disease of native coronary artery without angina pectoris: I25.10

## 2019-08-11 SURGERY — COLONOSCOPY WITH PROPOFOL
Anesthesia: General

## 2019-08-13 DIAGNOSIS — N2581 Secondary hyperparathyroidism of renal origin: Secondary | ICD-10-CM | POA: Diagnosis not present

## 2019-08-13 DIAGNOSIS — D509 Iron deficiency anemia, unspecified: Secondary | ICD-10-CM | POA: Diagnosis not present

## 2019-08-13 DIAGNOSIS — Z992 Dependence on renal dialysis: Secondary | ICD-10-CM | POA: Diagnosis not present

## 2019-08-13 DIAGNOSIS — N186 End stage renal disease: Secondary | ICD-10-CM | POA: Diagnosis not present

## 2019-08-13 DIAGNOSIS — E118 Type 2 diabetes mellitus with unspecified complications: Secondary | ICD-10-CM | POA: Diagnosis not present

## 2019-08-13 DIAGNOSIS — D649 Anemia, unspecified: Secondary | ICD-10-CM | POA: Diagnosis not present

## 2019-08-18 DIAGNOSIS — Z992 Dependence on renal dialysis: Secondary | ICD-10-CM | POA: Diagnosis not present

## 2019-08-18 DIAGNOSIS — D649 Anemia, unspecified: Secondary | ICD-10-CM | POA: Diagnosis not present

## 2019-08-18 DIAGNOSIS — N2581 Secondary hyperparathyroidism of renal origin: Secondary | ICD-10-CM | POA: Diagnosis not present

## 2019-08-18 DIAGNOSIS — D509 Iron deficiency anemia, unspecified: Secondary | ICD-10-CM | POA: Diagnosis not present

## 2019-08-18 DIAGNOSIS — N186 End stage renal disease: Secondary | ICD-10-CM | POA: Diagnosis not present

## 2019-08-18 DIAGNOSIS — E118 Type 2 diabetes mellitus with unspecified complications: Secondary | ICD-10-CM | POA: Diagnosis not present

## 2019-08-19 DIAGNOSIS — N186 End stage renal disease: Secondary | ICD-10-CM | POA: Diagnosis not present

## 2019-08-19 DIAGNOSIS — Z992 Dependence on renal dialysis: Secondary | ICD-10-CM | POA: Diagnosis not present

## 2019-08-19 DIAGNOSIS — I129 Hypertensive chronic kidney disease with stage 1 through stage 4 chronic kidney disease, or unspecified chronic kidney disease: Secondary | ICD-10-CM | POA: Diagnosis not present

## 2019-08-23 DIAGNOSIS — D509 Iron deficiency anemia, unspecified: Secondary | ICD-10-CM | POA: Diagnosis not present

## 2019-08-23 DIAGNOSIS — Z992 Dependence on renal dialysis: Secondary | ICD-10-CM | POA: Diagnosis not present

## 2019-08-23 DIAGNOSIS — E118 Type 2 diabetes mellitus with unspecified complications: Secondary | ICD-10-CM | POA: Diagnosis not present

## 2019-08-23 DIAGNOSIS — N2581 Secondary hyperparathyroidism of renal origin: Secondary | ICD-10-CM | POA: Diagnosis not present

## 2019-08-23 DIAGNOSIS — N186 End stage renal disease: Secondary | ICD-10-CM | POA: Diagnosis not present

## 2019-08-23 DIAGNOSIS — D649 Anemia, unspecified: Secondary | ICD-10-CM | POA: Diagnosis not present

## 2019-08-25 DIAGNOSIS — D509 Iron deficiency anemia, unspecified: Secondary | ICD-10-CM | POA: Diagnosis not present

## 2019-08-25 DIAGNOSIS — D649 Anemia, unspecified: Secondary | ICD-10-CM | POA: Diagnosis not present

## 2019-08-25 DIAGNOSIS — N2581 Secondary hyperparathyroidism of renal origin: Secondary | ICD-10-CM | POA: Diagnosis not present

## 2019-08-25 DIAGNOSIS — N186 End stage renal disease: Secondary | ICD-10-CM | POA: Diagnosis not present

## 2019-08-25 DIAGNOSIS — E118 Type 2 diabetes mellitus with unspecified complications: Secondary | ICD-10-CM | POA: Diagnosis not present

## 2019-08-25 DIAGNOSIS — Z992 Dependence on renal dialysis: Secondary | ICD-10-CM | POA: Diagnosis not present

## 2019-08-26 IMAGING — CR DG CHEST 2V
2 series · 2 of 2 positions shown · non-contrast
Comparison: 10/09/2018

CLINICAL DATA: Chest pain

EXAM:
CHEST - 2 VIEW

[chest pa]
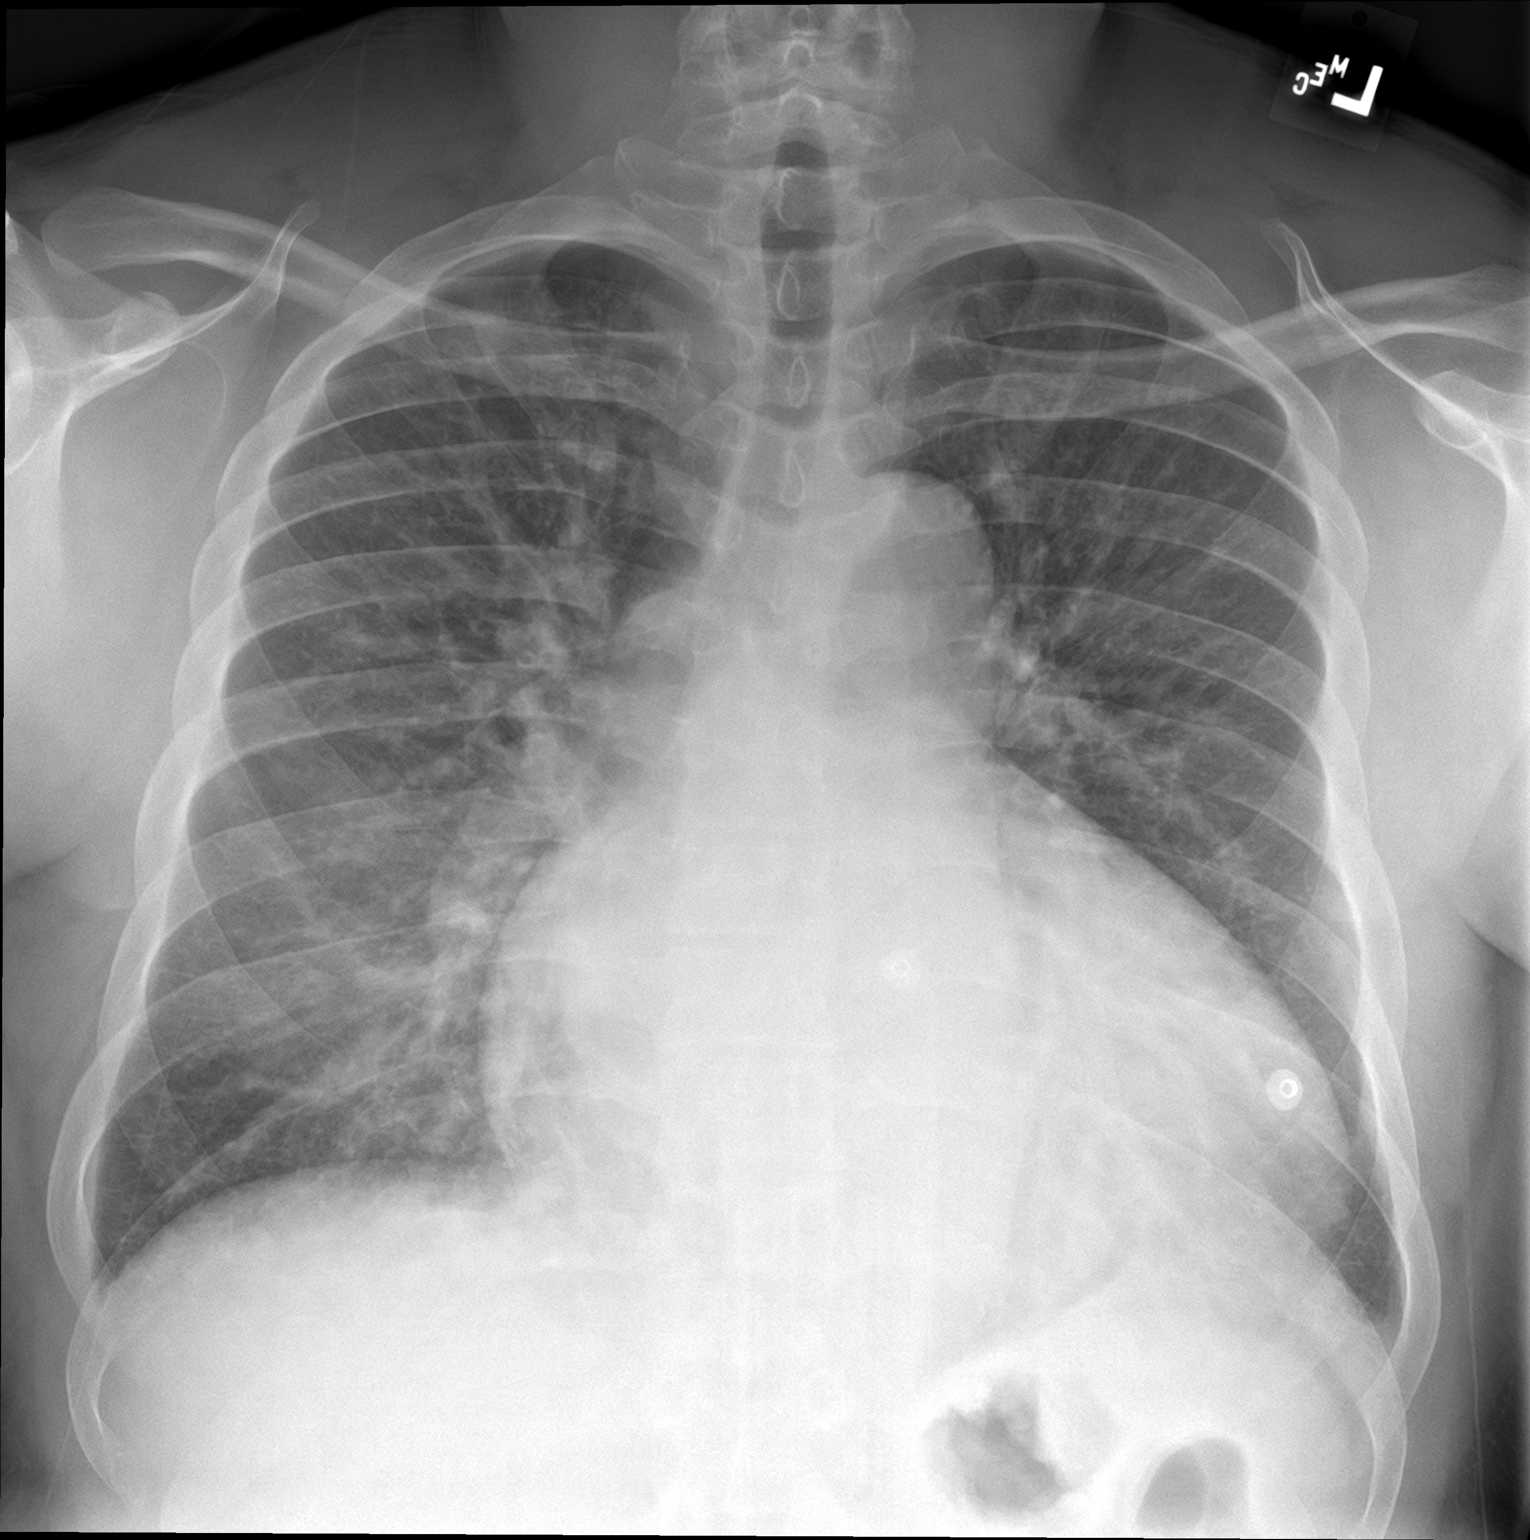

[chest lat]
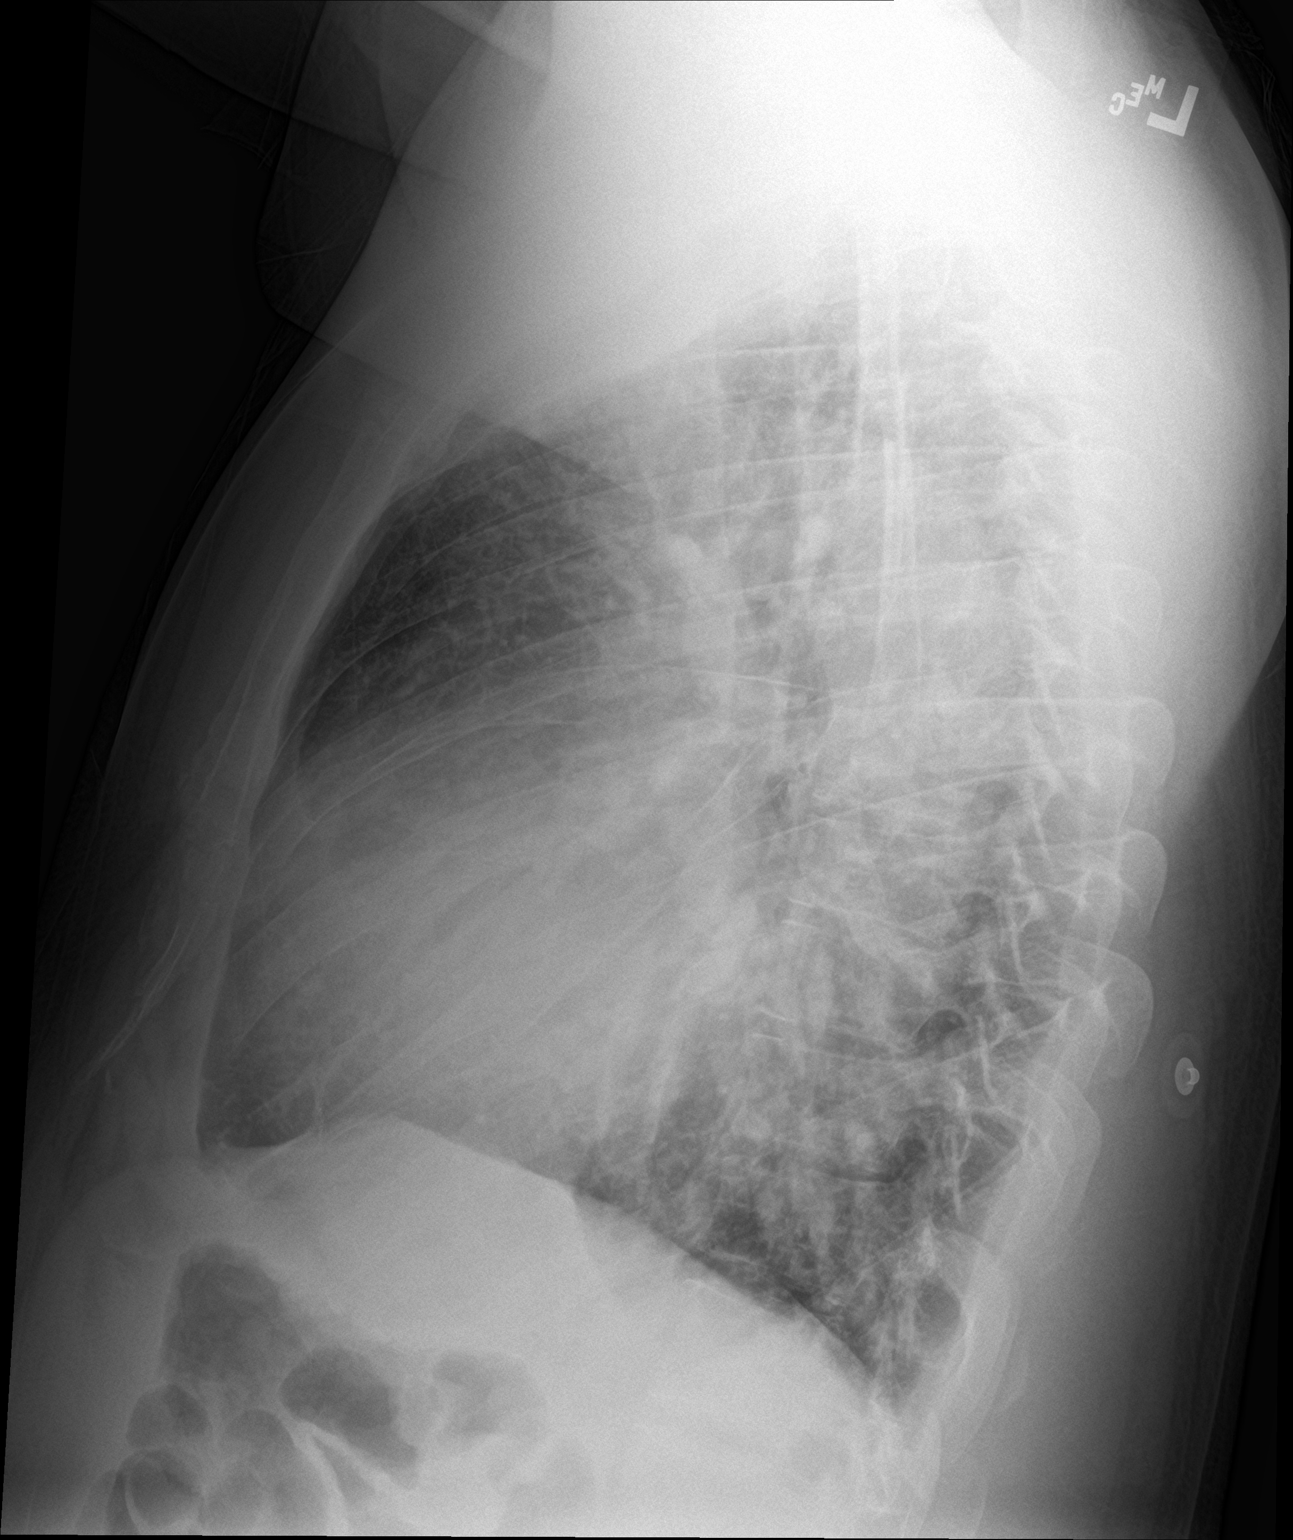

[2 of 2 positions shown; findings below may reference images not displayed]

FINDINGS: Cardiomegaly with vascular congestion. Trace pleural effusion.
Streaky atelectasis at the left base. No pneumothorax.
IMPRESSION: 1. Cardiomegaly with vascular congestion and trace pleural effusion
2. Streaky atelectasis at the left lung base.

## 2019-08-27 DIAGNOSIS — N2581 Secondary hyperparathyroidism of renal origin: Secondary | ICD-10-CM | POA: Diagnosis not present

## 2019-08-27 DIAGNOSIS — D509 Iron deficiency anemia, unspecified: Secondary | ICD-10-CM | POA: Diagnosis not present

## 2019-08-27 DIAGNOSIS — Z992 Dependence on renal dialysis: Secondary | ICD-10-CM | POA: Diagnosis not present

## 2019-08-27 DIAGNOSIS — N186 End stage renal disease: Secondary | ICD-10-CM | POA: Diagnosis not present

## 2019-08-27 DIAGNOSIS — D649 Anemia, unspecified: Secondary | ICD-10-CM | POA: Diagnosis not present

## 2019-08-27 DIAGNOSIS — E118 Type 2 diabetes mellitus with unspecified complications: Secondary | ICD-10-CM | POA: Diagnosis not present

## 2019-08-30 DIAGNOSIS — D509 Iron deficiency anemia, unspecified: Secondary | ICD-10-CM | POA: Diagnosis not present

## 2019-08-30 DIAGNOSIS — D649 Anemia, unspecified: Secondary | ICD-10-CM | POA: Diagnosis not present

## 2019-08-30 DIAGNOSIS — Z992 Dependence on renal dialysis: Secondary | ICD-10-CM | POA: Diagnosis not present

## 2019-08-30 DIAGNOSIS — E118 Type 2 diabetes mellitus with unspecified complications: Secondary | ICD-10-CM | POA: Diagnosis not present

## 2019-08-30 DIAGNOSIS — N186 End stage renal disease: Secondary | ICD-10-CM | POA: Diagnosis not present

## 2019-08-30 DIAGNOSIS — N2581 Secondary hyperparathyroidism of renal origin: Secondary | ICD-10-CM | POA: Diagnosis not present

## 2019-09-01 DIAGNOSIS — Z992 Dependence on renal dialysis: Secondary | ICD-10-CM | POA: Diagnosis not present

## 2019-09-01 DIAGNOSIS — N186 End stage renal disease: Secondary | ICD-10-CM | POA: Diagnosis not present

## 2019-09-01 DIAGNOSIS — D649 Anemia, unspecified: Secondary | ICD-10-CM | POA: Diagnosis not present

## 2019-09-01 DIAGNOSIS — E118 Type 2 diabetes mellitus with unspecified complications: Secondary | ICD-10-CM | POA: Diagnosis not present

## 2019-09-01 DIAGNOSIS — D509 Iron deficiency anemia, unspecified: Secondary | ICD-10-CM | POA: Diagnosis not present

## 2019-09-01 DIAGNOSIS — N2581 Secondary hyperparathyroidism of renal origin: Secondary | ICD-10-CM | POA: Diagnosis not present

## 2019-09-03 DIAGNOSIS — E118 Type 2 diabetes mellitus with unspecified complications: Secondary | ICD-10-CM | POA: Diagnosis not present

## 2019-09-03 DIAGNOSIS — D649 Anemia, unspecified: Secondary | ICD-10-CM | POA: Diagnosis not present

## 2019-09-03 DIAGNOSIS — N2581 Secondary hyperparathyroidism of renal origin: Secondary | ICD-10-CM | POA: Diagnosis not present

## 2019-09-03 DIAGNOSIS — D509 Iron deficiency anemia, unspecified: Secondary | ICD-10-CM | POA: Diagnosis not present

## 2019-09-03 DIAGNOSIS — Z992 Dependence on renal dialysis: Secondary | ICD-10-CM | POA: Diagnosis not present

## 2019-09-03 DIAGNOSIS — N186 End stage renal disease: Secondary | ICD-10-CM | POA: Diagnosis not present

## 2019-09-06 DIAGNOSIS — Z992 Dependence on renal dialysis: Secondary | ICD-10-CM | POA: Diagnosis not present

## 2019-09-06 DIAGNOSIS — D649 Anemia, unspecified: Secondary | ICD-10-CM | POA: Diagnosis not present

## 2019-09-06 DIAGNOSIS — E118 Type 2 diabetes mellitus with unspecified complications: Secondary | ICD-10-CM | POA: Diagnosis not present

## 2019-09-06 DIAGNOSIS — N2581 Secondary hyperparathyroidism of renal origin: Secondary | ICD-10-CM | POA: Diagnosis not present

## 2019-09-06 DIAGNOSIS — N186 End stage renal disease: Secondary | ICD-10-CM | POA: Diagnosis not present

## 2019-09-06 DIAGNOSIS — D509 Iron deficiency anemia, unspecified: Secondary | ICD-10-CM | POA: Diagnosis not present

## 2019-09-09 DIAGNOSIS — Z992 Dependence on renal dialysis: Secondary | ICD-10-CM | POA: Diagnosis not present

## 2019-09-09 DIAGNOSIS — N186 End stage renal disease: Secondary | ICD-10-CM | POA: Diagnosis not present

## 2019-09-09 DIAGNOSIS — D509 Iron deficiency anemia, unspecified: Secondary | ICD-10-CM | POA: Diagnosis not present

## 2019-09-09 DIAGNOSIS — D649 Anemia, unspecified: Secondary | ICD-10-CM | POA: Diagnosis not present

## 2019-09-09 DIAGNOSIS — N2581 Secondary hyperparathyroidism of renal origin: Secondary | ICD-10-CM | POA: Diagnosis not present

## 2019-09-09 DIAGNOSIS — E118 Type 2 diabetes mellitus with unspecified complications: Secondary | ICD-10-CM | POA: Diagnosis not present

## 2019-09-10 DIAGNOSIS — Z992 Dependence on renal dialysis: Secondary | ICD-10-CM | POA: Diagnosis not present

## 2019-09-10 DIAGNOSIS — N186 End stage renal disease: Secondary | ICD-10-CM | POA: Diagnosis not present

## 2019-09-10 DIAGNOSIS — D649 Anemia, unspecified: Secondary | ICD-10-CM | POA: Diagnosis not present

## 2019-09-10 DIAGNOSIS — E118 Type 2 diabetes mellitus with unspecified complications: Secondary | ICD-10-CM | POA: Diagnosis not present

## 2019-09-10 DIAGNOSIS — N2581 Secondary hyperparathyroidism of renal origin: Secondary | ICD-10-CM | POA: Diagnosis not present

## 2019-09-10 DIAGNOSIS — D509 Iron deficiency anemia, unspecified: Secondary | ICD-10-CM | POA: Diagnosis not present

## 2019-09-13 DIAGNOSIS — Z992 Dependence on renal dialysis: Secondary | ICD-10-CM | POA: Diagnosis not present

## 2019-09-13 DIAGNOSIS — N186 End stage renal disease: Secondary | ICD-10-CM | POA: Diagnosis not present

## 2019-09-13 DIAGNOSIS — E118 Type 2 diabetes mellitus with unspecified complications: Secondary | ICD-10-CM | POA: Diagnosis not present

## 2019-09-13 DIAGNOSIS — D649 Anemia, unspecified: Secondary | ICD-10-CM | POA: Diagnosis not present

## 2019-09-13 DIAGNOSIS — N2581 Secondary hyperparathyroidism of renal origin: Secondary | ICD-10-CM | POA: Diagnosis not present

## 2019-09-13 DIAGNOSIS — D509 Iron deficiency anemia, unspecified: Secondary | ICD-10-CM | POA: Diagnosis not present

## 2019-09-14 ENCOUNTER — Ambulatory Visit: Payer: Medicare Other | Admitting: Gastroenterology

## 2019-09-15 DIAGNOSIS — D509 Iron deficiency anemia, unspecified: Secondary | ICD-10-CM | POA: Diagnosis not present

## 2019-09-15 DIAGNOSIS — D649 Anemia, unspecified: Secondary | ICD-10-CM | POA: Diagnosis not present

## 2019-09-15 DIAGNOSIS — E118 Type 2 diabetes mellitus with unspecified complications: Secondary | ICD-10-CM | POA: Diagnosis not present

## 2019-09-15 DIAGNOSIS — N186 End stage renal disease: Secondary | ICD-10-CM | POA: Diagnosis not present

## 2019-09-15 DIAGNOSIS — N2581 Secondary hyperparathyroidism of renal origin: Secondary | ICD-10-CM | POA: Diagnosis not present

## 2019-09-15 DIAGNOSIS — Z992 Dependence on renal dialysis: Secondary | ICD-10-CM | POA: Diagnosis not present

## 2019-09-19 DIAGNOSIS — N186 End stage renal disease: Secondary | ICD-10-CM | POA: Diagnosis not present

## 2019-09-19 DIAGNOSIS — I129 Hypertensive chronic kidney disease with stage 1 through stage 4 chronic kidney disease, or unspecified chronic kidney disease: Secondary | ICD-10-CM | POA: Diagnosis not present

## 2019-09-19 DIAGNOSIS — Z992 Dependence on renal dialysis: Secondary | ICD-10-CM | POA: Diagnosis not present

## 2019-09-20 DIAGNOSIS — N186 End stage renal disease: Secondary | ICD-10-CM | POA: Diagnosis not present

## 2019-09-20 DIAGNOSIS — N2581 Secondary hyperparathyroidism of renal origin: Secondary | ICD-10-CM | POA: Diagnosis not present

## 2019-09-20 DIAGNOSIS — D509 Iron deficiency anemia, unspecified: Secondary | ICD-10-CM | POA: Diagnosis not present

## 2019-09-20 DIAGNOSIS — E118 Type 2 diabetes mellitus with unspecified complications: Secondary | ICD-10-CM | POA: Diagnosis not present

## 2019-09-20 DIAGNOSIS — D649 Anemia, unspecified: Secondary | ICD-10-CM | POA: Diagnosis not present

## 2019-09-20 DIAGNOSIS — Z992 Dependence on renal dialysis: Secondary | ICD-10-CM | POA: Diagnosis not present

## 2019-09-21 ENCOUNTER — Ambulatory Visit: Payer: Medicare Other | Admitting: Gastroenterology

## 2019-09-22 DIAGNOSIS — D509 Iron deficiency anemia, unspecified: Secondary | ICD-10-CM | POA: Diagnosis not present

## 2019-09-22 DIAGNOSIS — Z992 Dependence on renal dialysis: Secondary | ICD-10-CM | POA: Diagnosis not present

## 2019-09-22 DIAGNOSIS — D649 Anemia, unspecified: Secondary | ICD-10-CM | POA: Diagnosis not present

## 2019-09-22 DIAGNOSIS — E118 Type 2 diabetes mellitus with unspecified complications: Secondary | ICD-10-CM | POA: Diagnosis not present

## 2019-09-22 DIAGNOSIS — N186 End stage renal disease: Secondary | ICD-10-CM | POA: Diagnosis not present

## 2019-09-22 DIAGNOSIS — N2581 Secondary hyperparathyroidism of renal origin: Secondary | ICD-10-CM | POA: Diagnosis not present

## 2019-09-24 DIAGNOSIS — D649 Anemia, unspecified: Secondary | ICD-10-CM | POA: Diagnosis not present

## 2019-09-24 DIAGNOSIS — Z992 Dependence on renal dialysis: Secondary | ICD-10-CM | POA: Diagnosis not present

## 2019-09-24 DIAGNOSIS — N2581 Secondary hyperparathyroidism of renal origin: Secondary | ICD-10-CM | POA: Diagnosis not present

## 2019-09-24 DIAGNOSIS — D509 Iron deficiency anemia, unspecified: Secondary | ICD-10-CM | POA: Diagnosis not present

## 2019-09-24 DIAGNOSIS — N186 End stage renal disease: Secondary | ICD-10-CM | POA: Diagnosis not present

## 2019-09-24 DIAGNOSIS — E118 Type 2 diabetes mellitus with unspecified complications: Secondary | ICD-10-CM | POA: Diagnosis not present

## 2019-09-27 DIAGNOSIS — N186 End stage renal disease: Secondary | ICD-10-CM | POA: Diagnosis not present

## 2019-09-27 DIAGNOSIS — D509 Iron deficiency anemia, unspecified: Secondary | ICD-10-CM | POA: Diagnosis not present

## 2019-09-27 DIAGNOSIS — D649 Anemia, unspecified: Secondary | ICD-10-CM | POA: Diagnosis not present

## 2019-09-27 DIAGNOSIS — Z992 Dependence on renal dialysis: Secondary | ICD-10-CM | POA: Diagnosis not present

## 2019-09-27 DIAGNOSIS — E118 Type 2 diabetes mellitus with unspecified complications: Secondary | ICD-10-CM | POA: Diagnosis not present

## 2019-09-27 DIAGNOSIS — N2581 Secondary hyperparathyroidism of renal origin: Secondary | ICD-10-CM | POA: Diagnosis not present

## 2019-09-29 ENCOUNTER — Other Ambulatory Visit: Payer: Self-pay

## 2019-09-29 ENCOUNTER — Encounter: Payer: Self-pay | Admitting: Emergency Medicine

## 2019-09-29 ENCOUNTER — Emergency Department: Payer: Medicare Other

## 2019-09-29 ENCOUNTER — Emergency Department
Admission: EM | Admit: 2019-09-29 | Discharge: 2019-09-29 | Disposition: A | Discharge status: Home / Self Care | Payer: Medicare Other | Attending: Emergency Medicine | Admitting: Emergency Medicine

## 2019-09-29 DIAGNOSIS — R0789 Other chest pain: Secondary | ICD-10-CM | POA: Insufficient documentation

## 2019-09-29 DIAGNOSIS — I10 Essential (primary) hypertension: Secondary | ICD-10-CM

## 2019-09-29 DIAGNOSIS — I259 Chronic ischemic heart disease, unspecified: Secondary | ICD-10-CM | POA: Insufficient documentation

## 2019-09-29 DIAGNOSIS — N2581 Secondary hyperparathyroidism of renal origin: Secondary | ICD-10-CM | POA: Diagnosis not present

## 2019-09-29 DIAGNOSIS — E118 Type 2 diabetes mellitus with unspecified complications: Secondary | ICD-10-CM | POA: Diagnosis not present

## 2019-09-29 DIAGNOSIS — Z79899 Other long term (current) drug therapy: Secondary | ICD-10-CM | POA: Diagnosis not present

## 2019-09-29 DIAGNOSIS — Z91040 Latex allergy status: Secondary | ICD-10-CM | POA: Diagnosis not present

## 2019-09-29 DIAGNOSIS — I120 Hypertensive chronic kidney disease with stage 5 chronic kidney disease or end stage renal disease: Secondary | ICD-10-CM | POA: Diagnosis not present

## 2019-09-29 DIAGNOSIS — F17210 Nicotine dependence, cigarettes, uncomplicated: Secondary | ICD-10-CM | POA: Insufficient documentation

## 2019-09-29 DIAGNOSIS — N186 End stage renal disease: Secondary | ICD-10-CM | POA: Insufficient documentation

## 2019-09-29 DIAGNOSIS — R52 Pain, unspecified: Secondary | ICD-10-CM | POA: Diagnosis not present

## 2019-09-29 DIAGNOSIS — R252 Cramp and spasm: Secondary | ICD-10-CM | POA: Diagnosis not present

## 2019-09-29 DIAGNOSIS — R079 Chest pain, unspecified: Secondary | ICD-10-CM | POA: Diagnosis not present

## 2019-09-29 DIAGNOSIS — D509 Iron deficiency anemia, unspecified: Secondary | ICD-10-CM | POA: Diagnosis not present

## 2019-09-29 DIAGNOSIS — Z992 Dependence on renal dialysis: Secondary | ICD-10-CM | POA: Diagnosis not present

## 2019-09-29 DIAGNOSIS — D649 Anemia, unspecified: Secondary | ICD-10-CM | POA: Diagnosis not present

## 2019-09-29 LAB — CBC
HCT: 31.7 % — ABNORMAL LOW (ref 39.0–52.0)
Hemoglobin: 10.6 g/dL — ABNORMAL LOW (ref 13.0–17.0)
MCH: 30.2 pg (ref 26.0–34.0)
MCHC: 33.4 g/dL (ref 30.0–36.0)
MCV: 90.3 fL (ref 80.0–100.0)
Platelets: 165 10*3/uL (ref 150–400)
RBC: 3.51 MIL/uL — ABNORMAL LOW (ref 4.22–5.81)
RDW: 14.3 % (ref 11.5–15.5)
WBC: 7.2 10*3/uL (ref 4.0–10.5)
nRBC: 0 % (ref 0.0–0.2)

## 2019-09-29 LAB — BASIC METABOLIC PANEL
Anion gap: 11 (ref 5–15)
BUN: 30 mg/dL — ABNORMAL HIGH (ref 6–20)
CO2: 33 mmol/L — ABNORMAL HIGH (ref 22–32)
Calcium: 8.8 mg/dL — ABNORMAL LOW (ref 8.9–10.3)
Chloride: 95 mmol/L — ABNORMAL LOW (ref 98–111)
Creatinine, Ser: 7.07 mg/dL — ABNORMAL HIGH (ref 0.61–1.24)
GFR calc Af Amer: 11 mL/min — ABNORMAL LOW (ref >60)
GFR calc non Af Amer: 9 mL/min — ABNORMAL LOW (ref >60)
Glucose, Bld: 98 mg/dL (ref 70–99)
Potassium: 3.3 mmol/L — ABNORMAL LOW (ref 3.5–5.1)
Sodium: 139 mmol/L (ref 135–145)

## 2019-09-29 LAB — TROPONIN I (HIGH SENSITIVITY)
Troponin I (High Sensitivity): 145 ng/L (ref <18)
Troponin I (High Sensitivity): 135 ng/L (ref <18)

## 2019-09-29 MED ORDER — AMLODIPINE BESYLATE 5 MG PO TABS
10.0000 mg | ORAL_TABLET | Freq: Once | ORAL | Status: AC
  Administered 2019-09-29: 16:00:00 10 mg via ORAL
  Filled 2019-09-29: qty 2

## 2019-09-29 MED ORDER — LORAZEPAM 2 MG/ML IJ SOLN
0.5000 mg | Freq: Once | INTRAMUSCULAR | Status: AC
  Administered 2019-09-29: 0.5 mg via INTRAVENOUS
  Filled 2019-09-29: qty 1

## 2019-09-29 MED ORDER — ACETAMINOPHEN 500 MG PO TABS
1000.0000 mg | ORAL_TABLET | Freq: Once | ORAL | Status: AC
  Administered 2019-09-29: 16:00:00 1000 mg via ORAL
  Filled 2019-09-29: qty 2

## 2019-09-29 MED ORDER — MINOXIDIL 2.5 MG PO TABS
10.0000 mg | ORAL_TABLET | Freq: Every day | ORAL | Status: DC
  Administered 2019-09-29: 10 mg via ORAL
  Filled 2019-09-29: qty 4

## 2019-09-29 MED ORDER — METOPROLOL TARTRATE 25 MG PO TABS
25.0000 mg | ORAL_TABLET | Freq: Once | ORAL | Status: AC
  Administered 2019-09-29: 16:00:00 25 mg via ORAL
  Filled 2019-09-29: qty 1

## 2019-09-29 MED ORDER — LOSARTAN POTASSIUM 50 MG PO TABS
100.0000 mg | ORAL_TABLET | Freq: Once | ORAL | Status: AC
  Administered 2019-09-29: 16:00:00 100 mg via ORAL
  Filled 2019-09-29: qty 2

## 2019-09-29 NOTE — ED Notes (Signed)
Pt to ED from dialysis. PT finished 1 hour early and reports having left dialysis early on Monday due to claustrophobia. Pt reporting SOB with left sided CP but reports the SOB is similar to SOB in the past after missing dialysis.

## 2019-09-29 NOTE — ED Notes (Signed)
MD at bedside. 

## 2019-09-29 NOTE — ED Notes (Signed)
RN called nephrologist to have RN come to ED to de-access pts fistula. MD reporting he will call RN at this time.

## 2019-09-29 NOTE — ED Provider Notes (Signed)
[facility] Emergency Department Provider Note  Time seen: 3:19 PM  I have reviewed the triage vital signs and the nursing notes.   HISTORY  Chief Complaint Chest Pain   HPI Alan Gross is a 33 y.o. male with a past medical history anxiety, CAD, ESRD on HD Monday/Wednesday/Friday, presents to the emergency department for chest pain.  According to the patient during his dialysis session today he developed left-sided chest pain.  States it was somewhat sharp lasting for several minutes and then resolving.  Patient then had another episode of chest pain in the emergency department lasting several seconds and then resolving.  Described as a sharp pain.  Patient states the pain is worse if he pushes on the area of the chest.  States he has had chest pain in the past during his dialysis sessions completed most of the session today.  Denies any shortness of breath nausea or diaphoresis.  Denies any recent cough or fever.   Past Medical History:  Diagnosis Date  . Anxiety   . Coronary artery disease   . Depression   . Dyspnea   . ESRD (end stage renal disease) ([place])   . H/O cardiac catheterization   . History of febrile seizure   . Hypertension   . Irregular heart beats   . Kidney failure    M,W,F [place] in [place]; 15 %   . 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 Chest pain in adult 05/21/2019  . Hypertensive left ventricular hypertrophy, without heart failure 12/25/2018  . Fluid overload 10/27/2017  . Chest pain 09/03/2017  . Elevated troponin 08/18/2017  . 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 ESRD (end stage renal disease) on dialysis ([place]) 07/19/2016  . Anxiety 07/19/2016  . Heart failure, diastolic, due to HTN ([place]) 07/19/2016  . HTN (hypertension) 07/09/2016    Past Surgical History:  Procedure Laterality Date  . A/V FISTULAGRAM N/A  05/07/2017   Procedure: A/V Fistulagram;  Surgeon: Algernon Huxley, MD;  Location: [facility];  Service: Cardiovascular;  Laterality: N/A;  . AV FISTULA PLACEMENT Left 09/10/2016   Procedure: LEFT ARTERIOVENOUS (AV) FISTULA CREATION;  Surgeon: Conrad [facility], MD;  Location: [facility];  Service: Vascular;  Laterality: Left;  . CARDIAC CATHETERIZATION Left 05/02/2015   Procedure: Left Heart Cath and Coronary Angiography;  Surgeon: Dionisio David, MD;  Location: [facility];  Service: Cardiovascular;  Laterality: Left;  . CORONARY ANGIOPLASTY    . PERIPHERAL VASCULAR CATHETERIZATION N/A 07/11/2016   Procedure: Dialysis/Perma Catheter Insertion;  Surgeon: Algernon Huxley, MD;  Location: [facility];  Service: Cardiovascular;  Laterality: N/A;  . PERIPHERAL VASCULAR THROMBECTOMY Left 05/07/2017   Procedure: Peripheral Vascular Thrombectomy;  Surgeon: Algernon Huxley, MD;  Location: [facility];  Service: Cardiovascular;  Laterality: Left;    Prior to Admission medications   Medication Sig Start Date End Date Taking? Authorizing Provider  amLODipine (NORVASC) 10 MG tablet Take 1 tablet (10 mg total) by mouth every evening. 07/10/17   Awilda Bill, NP  losartan (COZAAR) 100 MG tablet Take 1 tablet (100 mg total) by mouth every evening. 09/04/17   Gladstone Lighter, MD  metoprolol succinate (TOPROL-XL) 25 MG 24 hr tablet Take 25 mg by mouth daily. 12/15/18   [provider]  minoxidil (LONITEN) 10 MG tablet Take  10 mg by mouth 2 (two) times daily with a meal.     [provider]  sertraline (ZOLOFT) 25 MG tablet Take 1 tablet (25 mg total) by mouth daily. 03/23/19 06/21/19  Trinna Post, PA-C  sucroferric oxyhydroxide (VELPHORO) 500 MG chewable tablet Chew 500 mg by mouth 4 (four) times daily.    [provider]    Allergies  Allergen Reactions  . Bee Venom Anaphylaxis  . Lasix [Furosemide] Shortness Of Breath, Swelling and Anxiety    Family  History  Problem Relation Age of Onset  . Hypertension Other   . Cancer Father        liver  . Hypertension Brother   . Diabetes Paternal Aunt   . Diabetes Paternal Uncle     Social History Social History   Tobacco Use  . Smoking status: Current Every Day Smoker    Packs/day: 0.25    Years: 10.00    Pack years: 2.50    Types: Cigarettes    Last attempt to quit: 06/22/2016    Years since quitting: 3.2  . Smokeless tobacco: Never Used  Substance Use Topics  . Alcohol use: Not Currently    Comment: Occasionally  . Drug use: Yes    Types: Marijuana    Review of Systems Constitutional: Negative for fever. Cardiovascular: Sharp left chest pain, minimal currently. Respiratory: Negative for shortness of breath. Gastrointestinal: Negative for abdominal pain Genitourinary: Negative for urinary compaints Musculoskeletal: Negative for musculoskeletal complaints Neurological: Negative for headache All other ROS negative  ____________________________________________   PHYSICAL EXAM:  VITAL SIGNS: ED Triage Vitals  Enc Vitals Group     BP 09/29/19 1424 (!) 204/117     Pulse Rate 09/29/19 1424 72     Resp 09/29/19 1424 16     Temp 09/29/19 1424 98.5 F (36.9 C)     Temp Source 09/29/19 1424 Oral     SpO2 09/29/19 1424 97 %     Weight --      Height --      Head Circumference --      Peak Flow --      Pain Score 09/29/19 1422 4     Pain Loc --      Pain Edu? --      Excl. in [place]? --    Constitutional: Alert and oriented. Well appearing and in no distress. Eyes: Normal exam ENT      Head: Normocephalic and atraumatic.      Mouth/Throat: Mucous membranes are moist. Cardiovascular: Normal rate, regular rhythm.  Respiratory: Normal respiratory effort without tachypnea nor retractions. Breath sounds are clear.  Moderate left chest wall tenderness to palpation. Gastrointestinal: Soft and nontender. No distention.  Musculoskeletal: Nontender with normal range of motion in  all extremities.  Neurologic:  Normal speech and language. No gross focal neurologic deficits Skin:  Skin is warm, dry and intact.  Psychiatric: Mood and affect are normal.   ____________________________________________    EKG  EKG viewed and interpreted by myself shows a normal sinus rhythm at 61 bpm with a narrow QRS, normal axis, largely normal intervals.  Patient does have lateral T wave changes.  EKG changes unchanged from prior EKG 05/20/2019.  ____________________________________________    RADIOLOGY  Chest x-ray shows no acute process.  ____________________________________________   INITIAL IMPRESSION / ASSESSMENT AND PLAN / ED COURSE  Pertinent labs & imaging results that were available during my care of the patient were reviewed by me and considered in my  medical decision making (see chart for details).   Patient presents to the emergency department for chest pain during dialysis.  Patient states he has had chest pain during dialysis previously.  Patient's blood pressure is somewhat elevated 204/117, patient states he normally takes his blood pressure medications after dialysis.  We will dose his home blood pressure medications.  Patient's labs show an elevated troponin of 145 however in reviewing the patient's prior troponins he has had levels similar/equivalent to this level in the past.  With no significant chest pain currently we will repeat a troponin in approximately 1 hour.  We will continue to closely monitor the patient in the emergency department.  Patient agreeable plan of care.  Reassuringly patient's chest tenderness is reproducible to palpation.  Patient's labs show a potassium 3.3 and BUN of 30, would not need further dialysis today.  Patient is requesting discharge home.  Patient's repeat troponin reassuringly is similar to prior results in fact slightly lower.  I offered the patient admission to the hospital given his high blood pressure and chest pain.  Patient  states he does not want to stay in the hospital and wants to go home.  Patient's blood pressure is still elevated but appears to be decreasing since receiving his home medications.  We will discharge the patient home per his wishes however I did discuss very strict return precautions.  Alan Gross was evaluated in Emergency Department on 09/29/2019 for the symptoms described in the history of present illness. He was evaluated in the context of the global COVID-19 pandemic, which necessitated consideration that the patient might be at risk for infection with the SARS-CoV-2 virus that causes COVID-19. Institutional protocols and algorithms that pertain to the evaluation of patients at risk for COVID-19 are in a state of rapid change based on information released by regulatory bodies including the CDC and federal and state organizations. These policies and algorithms were followed during the patient's care in the ED.  ____________________________________________   FINAL CLINICAL IMPRESSION(S) / ED DIAGNOSES  Chest pain Hypertension   Harvest Dark, MD 09/29/19 1712

## 2019-09-29 NOTE — ED Notes (Signed)
Pts dialysis fistula remains accessed at this time. RN attempted to call dialysis without success. Will attempt at another time.

## 2019-09-29 NOTE — ED Triage Notes (Signed)
PT from dialysis, states he has sudden onset of chest pain during dialysis. Pt states he had 1 nitro with some relief. PT has 1 hr left of dialysis.

## 2019-10-01 DIAGNOSIS — E118 Type 2 diabetes mellitus with unspecified complications: Secondary | ICD-10-CM | POA: Diagnosis not present

## 2019-10-01 DIAGNOSIS — Z992 Dependence on renal dialysis: Secondary | ICD-10-CM | POA: Diagnosis not present

## 2019-10-01 DIAGNOSIS — N186 End stage renal disease: Secondary | ICD-10-CM | POA: Diagnosis not present

## 2019-10-01 DIAGNOSIS — N2581 Secondary hyperparathyroidism of renal origin: Secondary | ICD-10-CM | POA: Diagnosis not present

## 2019-10-01 DIAGNOSIS — D649 Anemia, unspecified: Secondary | ICD-10-CM | POA: Diagnosis not present

## 2019-10-01 DIAGNOSIS — D509 Iron deficiency anemia, unspecified: Secondary | ICD-10-CM | POA: Diagnosis not present

## 2019-10-04 DIAGNOSIS — Z992 Dependence on renal dialysis: Secondary | ICD-10-CM | POA: Diagnosis not present

## 2019-10-04 DIAGNOSIS — N2581 Secondary hyperparathyroidism of renal origin: Secondary | ICD-10-CM | POA: Diagnosis not present

## 2019-10-04 DIAGNOSIS — D649 Anemia, unspecified: Secondary | ICD-10-CM | POA: Diagnosis not present

## 2019-10-04 DIAGNOSIS — N186 End stage renal disease: Secondary | ICD-10-CM | POA: Diagnosis not present

## 2019-10-04 DIAGNOSIS — E118 Type 2 diabetes mellitus with unspecified complications: Secondary | ICD-10-CM | POA: Diagnosis not present

## 2019-10-04 DIAGNOSIS — D509 Iron deficiency anemia, unspecified: Secondary | ICD-10-CM | POA: Diagnosis not present

## 2019-10-06 ENCOUNTER — Encounter: Payer: Self-pay | Admitting: Physician Assistant

## 2019-10-06 ENCOUNTER — Other Ambulatory Visit: Payer: Self-pay

## 2019-10-06 ENCOUNTER — Ambulatory Visit (INDEPENDENT_AMBULATORY_CARE_PROVIDER_SITE_OTHER): Payer: Medicare Other | Admitting: Physician Assistant

## 2019-10-06 VITALS — BP 166/82 | HR 93 | Temp 97.7°F | Resp 16 | Wt 219.0 lb

## 2019-10-06 DIAGNOSIS — N2581 Secondary hyperparathyroidism of renal origin: Secondary | ICD-10-CM | POA: Diagnosis not present

## 2019-10-06 DIAGNOSIS — I10 Essential (primary) hypertension: Secondary | ICD-10-CM

## 2019-10-06 DIAGNOSIS — M546 Pain in thoracic spine: Secondary | ICD-10-CM | POA: Diagnosis not present

## 2019-10-06 DIAGNOSIS — D649 Anemia, unspecified: Secondary | ICD-10-CM | POA: Diagnosis not present

## 2019-10-06 DIAGNOSIS — Z992 Dependence on renal dialysis: Secondary | ICD-10-CM

## 2019-10-06 DIAGNOSIS — N186 End stage renal disease: Secondary | ICD-10-CM

## 2019-10-06 DIAGNOSIS — E118 Type 2 diabetes mellitus with unspecified complications: Secondary | ICD-10-CM | POA: Diagnosis not present

## 2019-10-06 DIAGNOSIS — D509 Iron deficiency anemia, unspecified: Secondary | ICD-10-CM | POA: Diagnosis not present

## 2019-10-06 NOTE — Patient Instructions (Signed)
Muscle Cramps and Spasms Muscle cramps and spasms are when muscles tighten by themselves. They usually get better within minutes. Muscle cramps are painful. They are usually stronger and last longer than muscle spasms. Muscle spasms may or may not be painful. They can last a few seconds or much longer. Cramps and spasms can affect any muscle, but they occur most often in the calf muscles of the leg. They are usually not caused by a serious problem. In many cases, the cause is not known. Some common causes include:  Doing more physical work or exercise than your body is ready for.  Using the muscles too much (overuse) by repeating certain movements too many times.  Staying in a certain position for a long time.  Playing a sport or doing an activity without preparing properly.  Using bad form or technique while playing a sport or doing an activity.  Not having enough water in your body (dehydration).  Injury.  Side effects of some medicines.  Low levels of the salts and minerals in your blood (electrolytes), such as low potassium or calcium. Follow these instructions at home: Managing pain and stiffness      Massage, stretch, and relax the muscle. Do this for many minutes at a time.  If told, put heat on tight or tense muscles as often as told by your doctor. Use the heat source that your doctor recommends, such as a moist heat pack or a heating pad. ? Place a towel between your skin and the heat source. ? Leave the heat on for 20-30 minutes. ? Remove the heat if your skin turns bright red. This is very important if you are not able to feel pain, heat, or cold. You may have a greater risk of getting burned.  If told, put ice on the affected area. This may help if you are sore or have pain after a cramp or spasm. ? Put ice in a plastic bag. ? Place a towel between your skin and the bag. ? Leave the ice on for 20 minutes, 2-3 times a day.  Try taking hot showers or baths to help  relax tight muscles. Eating and drinking  Drink enough fluid to keep your pee (urine) pale yellow.  Eat a healthy diet to help ensure that your muscles work well. This should include: ? Fruits and vegetables. ? Lean protein. ? Whole grains. ? Low-fat or nonfat dairy products. General instructions  If you are having cramps often, avoid intense exercise for several days.  Take over-the-counter and prescription medicines only as told by your doctor.  Watch for any changes in your symptoms.  Keep all follow-up visits as told by your doctor. This is important. Contact a doctor if:  Your cramps or spasms get worse or happen more often.  Your cramps or spasms do not get better with time. Summary  Muscle cramps and spasms are when muscles tighten by themselves. They usually get better within minutes.  Cramps and spasms occur most often in the calf muscles of the leg.  Massage, stretch, and relax the muscle. This may help the cramp or spasm go away.  Drink enough fluid to keep your pee (urine) pale yellow. This information is not intended to replace advice given to you by your health care provider. Make sure you discuss any questions you have with your health care provider. Document Released: 10/17/2008 Document Revised: 03/30/2018 Document Reviewed: 03/30/2018 Elsevier Patient Education  2020 Elsevier Inc.  

## 2019-10-06 NOTE — Progress Notes (Signed)
Patient: Alan Gross Male    DOB: [birth year]   33 y.o.   MRN: [id number] Visit Date: 10/08/2019  Today's Provider: Trinna Post, PA-C   Chief Complaint  Patient presents with  . Back Pain   Subjective:     Back Pain This is a recurrent problem. Episode onset: more than 2 month ago. The problem occurs constantly. The problem is unchanged. Pertinent negatives include no abdominal pain, chest pain or fever.  Patient comes in complaining of a weakend spot on the left side of his back below the shoulder blade. Patient says this spot is painful to tough and causes his to become weak and dizzy whenever palpated. Patient reports he will have his wife massage it and also try to massage it on door jams or with objects, which does bring about relief. He feels like it gets worse after dialysis. His wife was worried the area of pain was his heart. He had an ER visit on 09/29/2019 with elevated troponin of 145, which is somewhat usual for him. Repeat troponin was lower and patient declined admission.   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 He has been taking them more consistently. He is nervous that he will make his blood pressure go too low too fast. Reports he had a blood pressure episode of 101/60 a few months ago and so he stopped taking his medicines the next day and several days after. Reports many times he ends up sleeping later in the morning and missing his doses of medications. Wonders if he should be off any medications at this point do they don't drop his BP too low.   Allergies  Allergen Reactions  . Bee Venom Anaphylaxis  . Lasix [Furosemide] Shortness Of Breath, Swelling and Anxiety      Current Outpatient Medications:  .  amLODipine (NORVASC) 10 MG tablet, Take 1 tablet (10 mg total) by mouth every evening., Disp: 30 tablet, Rfl: 1 .  losartan (COZAAR) 100 MG tablet, Take 1  tablet (100 mg total) by mouth every evening., Disp: 30 tablet, Rfl: 1 .  metoprolol succinate (TOPROL-XL) 25 MG 24 hr tablet, Take 25 mg by mouth daily., Disp: , Rfl:  .  minoxidil (LONITEN) 10 MG tablet, Take 10 mg by mouth 2 (two) times daily with a meal. , Disp: , Rfl: 3 .  sertraline (ZOLOFT) 25 MG tablet, Take 1 tablet (25 mg total) by mouth daily., Disp: 90 tablet, Rfl: 0 .  sucroferric oxyhydroxide (VELPHORO) 500 MG chewable tablet, Chew 500 mg by mouth 4 (four) times daily., Disp: , Rfl:   Review of Systems  Constitutional: Negative for appetite change, chills and fever.  Respiratory: Negative for chest tightness, shortness of breath and wheezing.   Cardiovascular: Negative for chest pain and palpitations.  Gastrointestinal: Negative for abdominal pain, nausea and vomiting.  Musculoskeletal: Positive for back pain.    Social History   Tobacco Use  . Smoking status: Current Every Day Smoker    Packs/day: 0.25    Years: 10.00    Pack years: 2.50    Types: Cigarettes    Last attempt to quit: 06/22/2016    Years since quitting: 3.2  . Smokeless tobacco: Never Used  Substance Use Topics  . Alcohol use: Not Currently    Comment: Occasionally      Objective:   BP (!) 166/82 (BP Location: Right Arm,  Patient Position: Sitting, Cuff Size: Large)   Pulse 93   Temp 97.7 F (36.5 C) (Temporal)   Resp 16   Wt 219 lb (99.3 kg)   SpO2 96% Comment: room air  BMI 28.12 kg/m  Vitals:   10/06/19 1537  BP: (!) 166/82  Pulse: 93  Resp: 16  Temp: 97.7 F (36.5 C)  TempSrc: Temporal  SpO2: 96%  Weight: 219 lb (99.3 kg)  Body mass index is 28.12 kg/m.   Physical Exam Constitutional:      Appearance: Normal appearance.  Cardiovascular:     Rate and Rhythm: Normal rate.     Pulses: Normal pulses.  Pulmonary:     Effort: Pulmonary effort is normal. No respiratory distress.  Musculoskeletal:       Arms:  Skin:    General: Skin is warm and dry.  Neurological:     Mental  Status: He is alert and oriented to person, place, and time. Mental status is at baseline.  Psychiatric:        Mood and Affect: Mood normal.        Behavior: Behavior normal.      No results found for any visits on 10/06/19.     Assessment & Plan    1. Bilateral thoracic back pain, unspecified chronicity   I am able to palpate his muscle in the spot of his concern and I think his pain is muscular. This would be consistent with improvement from massage.   2. ESRD (end stage renal disease) on dialysis ([place])  Continues with hemodialysis 3x/week.  3. Essential hypertension  Patient's blood pressure is elevated, which is his usual presentation. Since seeing patient I have not known a time where he took medications reliably or in any consistent fashion. He will miss doses due to sleeping late or if he feels poorly he will stop doses for the next few days. 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 Unfortunately, he seems to be in a cycle where any adverse outcome deters him from taking his blood pressure medicines consistently. I do not think he is able to come off these medications though I did tell him it is hard for me to tell what the true effect of the medicines are since he does not take them as advised. I would not discontinue any at this time.   The entirety of the information documented in the History of Present Illness, Review of Systems and Physical Exam were personally obtained by me. Portions of this information were initially documented by [facility], CMA and reviewed by me for thoroughness and accuracy.         Trinna Post, PA-C  [facility]

## 2019-10-08 DIAGNOSIS — D509 Iron deficiency anemia, unspecified: Secondary | ICD-10-CM | POA: Diagnosis not present

## 2019-10-08 DIAGNOSIS — N2581 Secondary hyperparathyroidism of renal origin: Secondary | ICD-10-CM | POA: Diagnosis not present

## 2019-10-08 DIAGNOSIS — N186 End stage renal disease: Secondary | ICD-10-CM | POA: Diagnosis not present

## 2019-10-08 DIAGNOSIS — E118 Type 2 diabetes mellitus with unspecified complications: Secondary | ICD-10-CM | POA: Diagnosis not present

## 2019-10-08 DIAGNOSIS — Z992 Dependence on renal dialysis: Secondary | ICD-10-CM | POA: Diagnosis not present

## 2019-10-08 DIAGNOSIS — D649 Anemia, unspecified: Secondary | ICD-10-CM | POA: Diagnosis not present

## 2019-10-12 DIAGNOSIS — D649 Anemia, unspecified: Secondary | ICD-10-CM | POA: Diagnosis not present

## 2019-10-12 DIAGNOSIS — D509 Iron deficiency anemia, unspecified: Secondary | ICD-10-CM | POA: Diagnosis not present

## 2019-10-12 DIAGNOSIS — N2581 Secondary hyperparathyroidism of renal origin: Secondary | ICD-10-CM | POA: Diagnosis not present

## 2019-10-12 DIAGNOSIS — Z992 Dependence on renal dialysis: Secondary | ICD-10-CM | POA: Diagnosis not present

## 2019-10-12 DIAGNOSIS — E118 Type 2 diabetes mellitus with unspecified complications: Secondary | ICD-10-CM | POA: Diagnosis not present

## 2019-10-12 DIAGNOSIS — N186 End stage renal disease: Secondary | ICD-10-CM | POA: Diagnosis not present

## 2019-10-15 DIAGNOSIS — Z992 Dependence on renal dialysis: Secondary | ICD-10-CM | POA: Diagnosis not present

## 2019-10-15 DIAGNOSIS — E118 Type 2 diabetes mellitus with unspecified complications: Secondary | ICD-10-CM | POA: Diagnosis not present

## 2019-10-15 DIAGNOSIS — D649 Anemia, unspecified: Secondary | ICD-10-CM | POA: Diagnosis not present

## 2019-10-15 DIAGNOSIS — N2581 Secondary hyperparathyroidism of renal origin: Secondary | ICD-10-CM | POA: Diagnosis not present

## 2019-10-15 DIAGNOSIS — N186 End stage renal disease: Secondary | ICD-10-CM | POA: Diagnosis not present

## 2019-10-15 DIAGNOSIS — D509 Iron deficiency anemia, unspecified: Secondary | ICD-10-CM | POA: Diagnosis not present

## 2019-10-18 DIAGNOSIS — D649 Anemia, unspecified: Secondary | ICD-10-CM | POA: Diagnosis not present

## 2019-10-18 DIAGNOSIS — N2581 Secondary hyperparathyroidism of renal origin: Secondary | ICD-10-CM | POA: Diagnosis not present

## 2019-10-18 DIAGNOSIS — E118 Type 2 diabetes mellitus with unspecified complications: Secondary | ICD-10-CM | POA: Diagnosis not present

## 2019-10-18 DIAGNOSIS — D509 Iron deficiency anemia, unspecified: Secondary | ICD-10-CM | POA: Diagnosis not present

## 2019-10-18 DIAGNOSIS — Z992 Dependence on renal dialysis: Secondary | ICD-10-CM | POA: Diagnosis not present

## 2019-10-18 DIAGNOSIS — N186 End stage renal disease: Secondary | ICD-10-CM | POA: Diagnosis not present

## 2019-10-20 DIAGNOSIS — E118 Type 2 diabetes mellitus with unspecified complications: Secondary | ICD-10-CM | POA: Diagnosis not present

## 2019-10-25 DIAGNOSIS — F17200 Nicotine dependence, unspecified, uncomplicated: Secondary | ICD-10-CM | POA: Diagnosis not present

## 2019-10-25 DIAGNOSIS — E6609 Other obesity due to excess calories: Secondary | ICD-10-CM | POA: Diagnosis not present

## 2019-10-25 DIAGNOSIS — I208 Other forms of angina pectoris: Secondary | ICD-10-CM | POA: Diagnosis not present

## 2019-10-25 DIAGNOSIS — I429 Cardiomyopathy, unspecified: Secondary | ICD-10-CM | POA: Diagnosis not present

## 2019-10-25 DIAGNOSIS — N186 End stage renal disease: Secondary | ICD-10-CM | POA: Diagnosis not present

## 2019-10-25 DIAGNOSIS — R0602 Shortness of breath: Secondary | ICD-10-CM | POA: Diagnosis not present

## 2019-10-25 DIAGNOSIS — R000 Tachycardia, unspecified: Secondary | ICD-10-CM | POA: Diagnosis not present

## 2019-10-25 DIAGNOSIS — I10 Essential (primary) hypertension: Secondary | ICD-10-CM | POA: Diagnosis not present

## 2019-10-25 DIAGNOSIS — Z6833 Body mass index (BMI) 33.0-33.9, adult: Secondary | ICD-10-CM | POA: Diagnosis not present

## 2019-10-25 DIAGNOSIS — R002 Palpitations: Secondary | ICD-10-CM | POA: Diagnosis not present

## 2019-10-26 ENCOUNTER — Ambulatory Visit (INDEPENDENT_AMBULATORY_CARE_PROVIDER_SITE_OTHER): Payer: Medicare Other | Admitting: Gastroenterology

## 2019-10-26 ENCOUNTER — Encounter: Payer: Self-pay | Admitting: Gastroenterology

## 2019-10-26 ENCOUNTER — Other Ambulatory Visit: Payer: Self-pay

## 2019-10-26 VITALS — BP 219/139 | HR 96 | Temp 97.6°F | Wt 224.1 lb

## 2019-10-26 DIAGNOSIS — D649 Anemia, unspecified: Secondary | ICD-10-CM

## 2019-10-26 DIAGNOSIS — R112 Nausea with vomiting, unspecified: Secondary | ICD-10-CM

## 2019-10-27 LAB — FERRITIN: Ferritin: 1129 ng/mL — ABNORMAL HIGH (ref 30–400)

## 2019-10-27 LAB — IRON AND TIBC
Iron Saturation: 23 % (ref 15–55)
Iron: 49 ug/dL (ref 38–169)
Total Iron Binding Capacity: 209 ug/dL — ABNORMAL LOW (ref 250–450)
UIBC: 160 ug/dL (ref 111–343)

## 2019-10-27 NOTE — Progress Notes (Signed)
[name], MD [address]  [place]  [place], [place] [address]  Main: [phone]  Fax: [phone]   Primary Care Physician: Trinna Post, PA-C   Chief Complaint  Patient presents with  . New Patient (Initial Visit)  . Anemia    Patient states he did have blood in stool. He has dark stool now. he vomits every day     HPI: Alan Gross is a 33 y.o. male here for follow-up of intermittent nausea vomiting, and changes in stool color.  On previous visit, EGD and colonoscopy were recommended and cardiac clearance was also recommended.  Patient had elevated blood pressures on last visit and was advised to get this under control and also go to urgent care or ER that same day.  He refused to go to the ER and has continued to have uncontrolled blood pressures and primary care notes report noncompliance.  He continues to report intermittent nausea and vomiting with no blood on emesis.  Reports bowel movements are sometimes dark.  Has been chronically anemic as well.  Has history of ESRD on hemodialysis.  Current Outpatient Medications  Medication Sig Dispense Refill  . amLODipine (NORVASC) 10 MG tablet Take 1 tablet (10 mg total) by mouth every evening. 30 tablet 1  . losartan (COZAAR) 100 MG tablet Take 1 tablet (100 mg total) by mouth every evening. 30 tablet 1  . metoprolol succinate (TOPROL-XL) 25 MG 24 hr tablet Take 25 mg by mouth daily.    . minoxidil (LONITEN) 10 MG tablet Take 10 mg by mouth 2 (two) times daily with a meal.   3  . sertraline (ZOLOFT) 25 MG tablet Take 1 tablet (25 mg total) by mouth daily. 90 tablet 0  . sucroferric oxyhydroxide (VELPHORO) 500 MG chewable tablet Chew 500 mg by mouth 4 (four) times daily.     No current facility-administered medications for this visit.     Allergies as of 10/26/2019 - Review Complete 10/26/2019  Allergen Reaction Noted  . Bee venom Anaphylaxis 03/29/2018  . Lasix [furosemide] Shortness Of Breath,  Swelling, and Anxiety 04/01/2017    ROS:  General: Negative for anorexia, weight loss, fever, chills, fatigue, weakness. ENT: Negative for hoarseness, difficulty swallowing , nasal congestion. CV: Negative for chest pain, angina, palpitations, dyspnea on exertion, peripheral edema.  Respiratory: Negative for dyspnea at rest, dyspnea on exertion, cough, sputum, wheezing.  GI: See history of present illness. GU:  Negative for dysuria, hematuria, urinary incontinence, urinary frequency, nocturnal urination.  Endo: Negative for unusual weight change.    Physical Examination:   BP (!) 219/139 (BP Location: Right Arm, Patient Position: Sitting, Cuff Size: Normal)   Pulse 96   Temp 97.6 F (36.4 C) (Tympanic)   Wt 224 lb 2 oz (101.7 kg)   BMI 28.78 kg/m   General: Well-nourished, well-developed in no acute distress.  Eyes: No icterus. Conjunctivae pink. Mouth: Oropharyngeal mucosa moist and pink , no lesions erythema or exudate. Neck: Supple, Trachea midline Abdomen: Bowel sounds are normal, nontender, nondistended, no hepatosplenomegaly or masses, no abdominal bruits or hernia , no rebound or guarding.   Extremities: No lower extremity edema. No clubbing or deformities. Neuro: Alert and oriented x 3.  Grossly intact. Skin: Warm and dry, no jaundice.   Psych: Alert and cooperative, normal mood and affect.   Labs: CMP     Component Value Date/Time   NA 139 09/29/2019 1426   NA 138 11/19/2018 1527   NA 139  02/14/2015 1944   K 3.3 (L) 09/29/2019 1426   K 3.4 (L) 02/14/2015 1944   CL 95 (L) 09/29/2019 1426   CL 101 02/14/2015 1944   CO2 33 (H) 09/29/2019 1426   CO2 32 02/14/2015 1944   GLUCOSE 98 09/29/2019 1426   GLUCOSE 105 (H) 02/14/2015 1944   BUN 30 (H) 09/29/2019 1426   BUN 65 (H) 11/19/2018 1527   BUN 13 02/14/2015 1944   CREATININE 7.07 (H) 09/29/2019 1426   CREATININE 1.12 02/14/2015 1944   CALCIUM 8.8 (L) 09/29/2019 1426   CALCIUM 9.4 02/14/2015 1944   PROT 7.2  05/17/2019 1256   PROT 5.9 (L) 11/19/2018 1527   PROT 7.2 10/14/2013 1959   ALBUMIN 4.0 05/17/2019 1256   ALBUMIN 3.9 11/19/2018 1527   ALBUMIN 4.0 10/14/2013 1959   AST 21 05/17/2019 1256   AST 26 10/14/2013 1959   ALT 16 05/17/2019 1256   ALT 31 10/14/2013 1959   ALKPHOS 48 05/17/2019 1256   ALKPHOS 73 10/14/2013 1959   BILITOT 0.5 05/17/2019 1256   BILITOT 0.5 11/19/2018 1527   BILITOT 0.2 10/14/2013 1959   GFRNONAA 9 (L) 09/29/2019 1426   GFRNONAA >60 02/14/2015 1944   GFRAA 11 (L) 09/29/2019 1426   GFRAA >60 02/14/2015 1944   Lab Results  Component Value Date   WBC 7.2 09/29/2019   HGB 10.6 (L) 09/29/2019   HCT 31.7 (L) 09/29/2019   MCV 90.3 09/29/2019   PLT 165 09/29/2019    Imaging Studies: Dg Chest 2 View  Result Date: 09/29/2019 CLINICAL DATA:  Chest pain EXAM: CHEST - 2 VIEW COMPARISON:  November 17, 2018 FINDINGS: Lungs are clear. No pleural effusion or pneumothorax. Stable cardiomegaly. No acute osseous abnormality. IMPRESSION: No acute process in the chest.  Stable cardiomegaly. Electronically Signed   By: Macy Mis M.D.   On: 09/29/2019 14:50    Assessment and Plan:   Alan Gross is a 33 y.o. y/o male with uncontrolled blood pressure with apparent noncompliance to medications and follow-up with his primary care doctor in regard to the same here for intermittent nausea vomiting and changes in stool color  I have advised that he go to the ER or urgent care today due to elevated blood pressures.  However, he refuses and again we discussed risks of stroke, heart attack, and death with ongoing uncontrolled blood pressures.  He verbalized understanding and understands these risks but chooses not to go to an urgent care or ER.  We again discussed that his nausea and vomiting may be related to his uncontrolled blood pressures and he should work on these closely with his primary care provider and he verbalized understanding.   however, due to ongoing  symptoms and changes in stool color and anemia, EGD and colonoscopy were discussed again.  However, patient will need cardiac clearance and control of his blood pressures before this can be done.  He will follow-up with his PCP to control his blood pressures better and will get back to [place] once he is ready to schedule these procedures  We will check for any evidence of iron deficiency that could be causing his anemia.  May be related to his ESRD    Dr [name]

## 2019-11-03 ENCOUNTER — Telehealth: Payer: Self-pay

## 2019-11-03 DIAGNOSIS — R7989 Other specified abnormal findings of blood chemistry: Secondary | ICD-10-CM

## 2019-11-03 NOTE — Telephone Encounter (Signed)
-----   Message from Virgel Manifold, MD sent at 11/03/2019 12:56 PM EST ----- Caryl Pina please let the patient know, his blood work shows elevated ferritin, which is iron stores.  However, they can be elevated in other conditions.  I recommend referral to hematology for further evaluation.  Please refer.

## 2019-11-03 NOTE — Telephone Encounter (Signed)
Patient verbalized understanding and put in referral

## 2019-11-10 ENCOUNTER — Encounter: Payer: Self-pay | Admitting: Emergency Medicine

## 2019-11-10 ENCOUNTER — Other Ambulatory Visit: Payer: Self-pay

## 2019-11-10 ENCOUNTER — Emergency Department
Admission: EM | Admit: 2019-11-10 | Discharge: 2019-11-10 | Disposition: A | Discharge status: Home / Self Care | Payer: Medicare Other | Attending: Emergency Medicine | Admitting: Emergency Medicine

## 2019-11-10 ENCOUNTER — Emergency Department: Payer: Medicare Other

## 2019-11-10 DIAGNOSIS — R0789 Other chest pain: Secondary | ICD-10-CM

## 2019-11-10 DIAGNOSIS — I5030 Unspecified diastolic (congestive) heart failure: Secondary | ICD-10-CM | POA: Insufficient documentation

## 2019-11-10 DIAGNOSIS — I471 Supraventricular tachycardia: Secondary | ICD-10-CM | POA: Diagnosis not present

## 2019-11-10 DIAGNOSIS — N186 End stage renal disease: Secondary | ICD-10-CM | POA: Diagnosis not present

## 2019-11-10 DIAGNOSIS — I2510 Atherosclerotic heart disease of native coronary artery without angina pectoris: Secondary | ICD-10-CM | POA: Insufficient documentation

## 2019-11-10 DIAGNOSIS — I132 Hypertensive heart and chronic kidney disease with heart failure and with stage 5 chronic kidney disease, or end stage renal disease: Secondary | ICD-10-CM | POA: Insufficient documentation

## 2019-11-10 DIAGNOSIS — R079 Chest pain, unspecified: Secondary | ICD-10-CM | POA: Diagnosis present

## 2019-11-10 DIAGNOSIS — F17210 Nicotine dependence, cigarettes, uncomplicated: Secondary | ICD-10-CM | POA: Diagnosis not present

## 2019-11-10 LAB — CBC WITH DIFFERENTIAL/PLATELET
Abs Immature Granulocytes: 0.01 10*3/uL (ref 0.00–0.07)
Basophils Absolute: 0 10*3/uL (ref 0.0–0.1)
Basophils Relative: 1 %
Eosinophils Absolute: 0.1 10*3/uL (ref 0.0–0.5)
Eosinophils Relative: 2 %
HCT: 30.9 % — ABNORMAL LOW (ref 39.0–52.0)
Hemoglobin: 10.4 g/dL — ABNORMAL LOW (ref 13.0–17.0)
Immature Granulocytes: 0 %
Lymphocytes Relative: 13 %
Lymphs Abs: 0.7 10*3/uL (ref 0.7–4.0)
MCH: 30.2 pg (ref 26.0–34.0)
MCHC: 33.7 g/dL (ref 30.0–36.0)
MCV: 89.8 fL (ref 80.0–100.0)
Monocytes Absolute: 0.4 10*3/uL (ref 0.1–1.0)
Monocytes Relative: 7 %
Neutro Abs: 4.5 10*3/uL (ref 1.7–7.7)
Neutrophils Relative %: 77 %
Platelets: 173 10*3/uL (ref 150–400)
RBC: 3.44 MIL/uL — ABNORMAL LOW (ref 4.22–5.81)
RDW: 14.6 % (ref 11.5–15.5)
WBC: 5.8 10*3/uL (ref 4.0–10.5)
nRBC: 0 % (ref 0.0–0.2)

## 2019-11-10 LAB — COMPREHENSIVE METABOLIC PANEL
ALT: 13 U/L (ref 0–44)
AST: 16 U/L (ref 15–41)
Albumin: 3.8 g/dL (ref 3.5–5.0)
Alkaline Phosphatase: 33 U/L — ABNORMAL LOW (ref 38–126)
Anion gap: 11 (ref 5–15)
BUN: 22 mg/dL — ABNORMAL HIGH (ref 6–20)
CO2: 31 mmol/L (ref 22–32)
Calcium: 8.8 mg/dL — ABNORMAL LOW (ref 8.9–10.3)
Chloride: 96 mmol/L — ABNORMAL LOW (ref 98–111)
Creatinine, Ser: 6.6 mg/dL — ABNORMAL HIGH (ref 0.61–1.24)
GFR calc Af Amer: 11 mL/min — ABNORMAL LOW (ref >60)
GFR calc non Af Amer: 10 mL/min — ABNORMAL LOW (ref >60)
Glucose, Bld: 110 mg/dL — ABNORMAL HIGH (ref 70–99)
Potassium: 3.1 mmol/L — ABNORMAL LOW (ref 3.5–5.1)
Sodium: 138 mmol/L (ref 135–145)
Total Bilirubin: 0.8 mg/dL (ref 0.3–1.2)
Total Protein: 6.4 g/dL — ABNORMAL LOW (ref 6.5–8.1)

## 2019-11-10 LAB — TROPONIN I (HIGH SENSITIVITY)
Troponin I (High Sensitivity): 115 ng/L (ref <18)
Troponin I (High Sensitivity): 144 ng/L (ref <18)

## 2019-11-10 MED ORDER — OXYCODONE-ACETAMINOPHEN 5-325 MG PO TABS: 1.0000 | ORAL_TABLET | Freq: Once | ORAL | Status: DC

## 2019-11-10 NOTE — ED Notes (Signed)
Val RN transported pt to dialysis

## 2019-11-10 NOTE — ED Notes (Signed)
Date and time results received: 11/10/19 1445 (use smartphrase ".now" to insert current time)  Test: Troponin Critical Value: 115  Name of Provider Notified: Dr Jimmye Norman   Orders Received? Or Actions Taken?: No new orders at this time

## 2019-11-10 NOTE — ED Provider Notes (Signed)
[facility] Emergency Department Provider Note       Time seen: ----------------------------------------- 1:40 PM on 11/10/2019 -----------------------------------------   I have reviewed the triage vital signs and the nursing notes.  HISTORY   Chief Complaint No chief complaint on file.    HPI Alan Gross is a 33 y.o. male with a history of anxiety, coronary disease, depression, dyspnea, end-stage renal disease, hypertension, renal failure who presents to the ED for chest pain.  Reportedly patient had SVT and was given 6 mg of adenosine in route with conversion of SVT.  Currently he is in normal sinus rhythm.  He has a left-sided chest pain now with palpation.  He states he had 1 hour left of his dialysis.  Past Medical History:  Diagnosis Date  . Anxiety   . Coronary artery disease   . Depression   . Dyspnea   . ESRD (end stage renal disease) ([place])   . H/O cardiac catheterization   . History of febrile seizure   . Hypertension   . Irregular heart beats   . Kidney failure    M,W,F [place] in [place]; 15 %   . 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 Chest pain in adult 05/21/2019  . Hypertensive left ventricular hypertrophy, without heart failure 12/25/2018  . Fluid overload 10/27/2017  . Chest pain 09/03/2017  . Elevated troponin 08/18/2017  . 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 ESRD (end stage renal disease) on dialysis ([place]) 07/19/2016  . Anxiety 07/19/2016  . Heart failure, diastolic, due to HTN ([place]) 07/19/2016  . HTN (hypertension) 07/09/2016    Past Surgical History:  Procedure Laterality Date  . A/V FISTULAGRAM N/A 05/07/2017   Procedure: A/V Fistulagram;  Surgeon: Algernon Huxley, MD;  Location: [facility];  Service: Cardiovascular;  Laterality: N/A;  . AV FISTULA PLACEMENT Left 09/10/2016   Procedure: LEFT  ARTERIOVENOUS (AV) FISTULA CREATION;  Surgeon: Conrad [place], MD;  Location: [facility];  Service: Vascular;  Laterality: Left;  . CARDIAC CATHETERIZATION Left 05/02/2015   Procedure: Left Heart Cath and Coronary Angiography;  Surgeon: Dionisio David, MD;  Location: [facility];  Service: Cardiovascular;  Laterality: Left;  . CORONARY ANGIOPLASTY    . PERIPHERAL VASCULAR CATHETERIZATION N/A 07/11/2016   Procedure: Dialysis/Perma Catheter Insertion;  Surgeon: Algernon Huxley, MD;  Location: [facility];  Service: Cardiovascular;  Laterality: N/A;  . PERIPHERAL VASCULAR THROMBECTOMY Left 05/07/2017   Procedure: Peripheral Vascular Thrombectomy;  Surgeon: Algernon Huxley, MD;  Location: [facility];  Service: Cardiovascular;  Laterality: Left;    Allergies Bee venom and Lasix [furosemide]  Social History Social History   Tobacco Use  . Smoking status: Current Every Day Smoker    Packs/day: 0.25    Years: 10.00    Pack years: 2.50    Types: Cigarettes    Last attempt to quit: 06/22/2016    Years since quitting: 3.3  . Smokeless tobacco: Never Used  Substance Use Topics  . Alcohol use: Not Currently    Comment: Occasionally  . Drug use: Yes    Types: Marijuana   Review of Systems Constitutional: Negative for fever. Cardiovascular: Positive for chest pain, palpitations Respiratory: Negative for shortness of breath. Gastrointestinal: Negative for abdominal pain, vomiting and diarrhea. Musculoskeletal: Negative for back pain. Skin: Negative for rash. Neurological:  Negative for headaches, focal weakness or numbness.  All systems negative/normal/unremarkable except as stated in the HPI  ____________________________________________   PHYSICAL EXAM:  VITAL SIGNS: ED Triage Vitals  Enc Vitals Group     BP      Pulse      Resp      Temp      Temp src      SpO2      Weight      Height      Head Circumference      Peak Flow      Pain Score      Pain Loc       Pain Edu?      Excl. in [place]?     Constitutional: Alert and oriented. Well appearing and in no distress. Eyes: Conjunctivae are normal. Normal extraocular movements. ENT      Head: Normocephalic and atraumatic.      Nose: No congestion/rhinnorhea.      Mouth/Throat: Mucous membranes are moist.      Neck: No stridor. Cardiovascular: Normal rate, regular rhythm. No murmurs, rubs, or gallops. Respiratory: Normal respiratory effort without tachypnea nor retractions. Breath sounds are clear and equal bilaterally. No wheezes/rales/rhonchi. Gastrointestinal: Soft and nontender. Normal bowel sounds Musculoskeletal: Nontender with normal range of motion in extremities. No lower extremity tenderness nor edema.  Exquisite left chest wall tenderness Neurologic:  Normal speech and language. No gross focal neurologic deficits are appreciated.  Skin:  Skin is warm, dry and intact. No rash noted. Psychiatric: Mood and affect are normal. Speech and behavior are normal.  ____________________________________________  EKG: Interpreted by me.  Sinus rhythm with rate of 89 bpm, LVH with repolarization abnormality, long QT  ____________________________________________  ED COURSE:  As part of my medical decision making, I reviewed the following data within the [place] History obtained from family if available, nursing notes, old chart and ekg, as well as notes from prior ED visits. Patient presented for chest pain with recent SVT, we will assess with labs and imaging as indicated at this time.   Procedures  Alan Gross was evaluated in Emergency Department on 11/10/2019 for the symptoms described in the history of present illness. He was evaluated in the context of the global COVID-19 pandemic, which necessitated consideration that the patient might be at risk for infection with the SARS-CoV-2 virus that causes COVID-19. Institutional protocols and algorithms that pertain to the  evaluation of patients at risk for COVID-19 are in a state of rapid change based on information released by regulatory bodies including the CDC and federal and state organizations. These policies and algorithms were followed during the patient's care in the ED.  ____________________________________________   LABS (pertinent positives/negatives)  Labs Reviewed  CBC WITH DIFFERENTIAL/PLATELET - Abnormal; Notable for the following components:      Result Value   RBC 3.44 (*)    Hemoglobin 10.4 (*)    HCT 30.9 (*)    All other components within normal limits  COMPREHENSIVE METABOLIC PANEL - Abnormal; Notable for the following components:   Potassium 3.1 (*)    Chloride 96 (*)    Glucose, Bld 110 (*)    BUN 22 (*)    Creatinine, Ser 6.60 (*)    Calcium 8.8 (*)    Total Protein 6.4 (*)    Alkaline Phosphatase 33 (*)    GFR calc non Af Amer 10 (*)    GFR calc Af Amer 11 (*)  All other components within normal limits  TROPONIN I (HIGH SENSITIVITY) - Abnormal; Notable for the following components:   Troponin I (High Sensitivity) 115 (*)    All other components within normal limits    RADIOLOGY Images were viewed by me  Chest x-ray  IMPRESSION:  No active cardiopulmonary disease.  ____________________________________________   DIFFERENTIAL DIAGNOSIS   Musculoskeletal pain, GERD, anxiety, chronic pain, MI, unstable angina, SVT  FINAL ASSESSMENT AND PLAN  SVT, chest wall pain   Plan: The patient had presented for SVT, chest wall pain. Patient's labs were as expected, he has a chronic troponin elevation with known end-stage renal disease on dialysis. Patient's imaging has not revealed any acute process.  We will repeat his troponin and likely discharge if no interval change.   Laurence Aly, MD    Note: This note was generated in part or whole with voice recognition software. Voice recognition is usually quite accurate but there are transcription errors that can and  very often do occur. I apologize for any typographical errors that were not detected and corrected.     Earleen Newport, MD 11/10/19 [phone]

## 2019-11-10 NOTE — ED Notes (Signed)
Pt given meal tray.

## 2019-11-10 NOTE — ED Notes (Signed)
Patient transported to X-ray 

## 2019-11-10 NOTE — ED Triage Notes (Signed)
Pt was 3 hrs into dialysis and started with chest pain.  PIV by EMS with 6 mg adenosine. Pt was initially SVT and converted to NSR with the adenosine.  Left side chest pain now on palpation. Pt came with fistula accessed.

## 2019-11-10 NOTE — ED Notes (Signed)
Pt here with fistula accessed.  Dialysis called to have access removed.

## 2019-11-10 NOTE — ED Provider Notes (Signed)
-----------------------------------------   3:16 PM on 11/10/2019 -----------------------------------------  Blood pressure 136/73, pulse 90, temperature 98.5 F (36.9 C), temperature source Oral, resp. rate 19, weight 101.7 kg, SpO2 98 %.  Assuming care from Dr. Jimmye Norman.  In short, Alan Gross is a 33 y.o. male with a chief complaint of Chest Pain .  Refer to the original H&P for additional details.  The current plan of care is to follow-up repeat troponin, if negative patient would be appropriate for discharge home.  Repeat troponin comparable to prior, patient is appropriate for discharge home with cardiology follow-up.  I have counseled him to return to the ED for new or worsening symptoms, patient agrees with plan.    Blake Divine, MD 11/10/19 1714

## 2019-11-15 ENCOUNTER — Inpatient Hospital Stay: Payer: Medicare Other | Admitting: Oncology

## 2019-11-20 DIAGNOSIS — N2581 Secondary hyperparathyroidism of renal origin: Secondary | ICD-10-CM | POA: Diagnosis not present

## 2019-11-20 DIAGNOSIS — N186 End stage renal disease: Secondary | ICD-10-CM | POA: Diagnosis not present

## 2019-11-20 DIAGNOSIS — Z992 Dependence on renal dialysis: Secondary | ICD-10-CM | POA: Diagnosis not present

## 2019-11-26 DIAGNOSIS — Z992 Dependence on renal dialysis: Secondary | ICD-10-CM | POA: Diagnosis not present

## 2019-11-26 DIAGNOSIS — N2581 Secondary hyperparathyroidism of renal origin: Secondary | ICD-10-CM | POA: Diagnosis not present

## 2019-11-26 DIAGNOSIS — N186 End stage renal disease: Secondary | ICD-10-CM | POA: Diagnosis not present

## 2019-11-29 DIAGNOSIS — N2581 Secondary hyperparathyroidism of renal origin: Secondary | ICD-10-CM | POA: Diagnosis not present

## 2019-11-29 DIAGNOSIS — N186 End stage renal disease: Secondary | ICD-10-CM | POA: Diagnosis not present

## 2019-11-29 DIAGNOSIS — Z992 Dependence on renal dialysis: Secondary | ICD-10-CM | POA: Diagnosis not present

## 2019-11-30 ENCOUNTER — Inpatient Hospital Stay: Payer: Medicare HMO | Admitting: Oncology

## 2019-12-01 DIAGNOSIS — Z992 Dependence on renal dialysis: Secondary | ICD-10-CM | POA: Diagnosis not present

## 2019-12-01 DIAGNOSIS — N2581 Secondary hyperparathyroidism of renal origin: Secondary | ICD-10-CM | POA: Diagnosis not present

## 2019-12-01 DIAGNOSIS — N186 End stage renal disease: Secondary | ICD-10-CM | POA: Diagnosis not present

## 2019-12-03 DIAGNOSIS — Z992 Dependence on renal dialysis: Secondary | ICD-10-CM | POA: Diagnosis not present

## 2019-12-03 DIAGNOSIS — N2581 Secondary hyperparathyroidism of renal origin: Secondary | ICD-10-CM | POA: Diagnosis not present

## 2019-12-03 DIAGNOSIS — N186 End stage renal disease: Secondary | ICD-10-CM | POA: Diagnosis not present

## 2019-12-08 DIAGNOSIS — N186 End stage renal disease: Secondary | ICD-10-CM | POA: Diagnosis not present

## 2019-12-08 DIAGNOSIS — N2581 Secondary hyperparathyroidism of renal origin: Secondary | ICD-10-CM | POA: Diagnosis not present

## 2019-12-08 DIAGNOSIS — Z992 Dependence on renal dialysis: Secondary | ICD-10-CM | POA: Diagnosis not present

## 2019-12-13 DIAGNOSIS — N186 End stage renal disease: Secondary | ICD-10-CM | POA: Diagnosis not present

## 2019-12-13 DIAGNOSIS — N2581 Secondary hyperparathyroidism of renal origin: Secondary | ICD-10-CM | POA: Diagnosis not present

## 2019-12-13 DIAGNOSIS — Z992 Dependence on renal dialysis: Secondary | ICD-10-CM | POA: Diagnosis not present

## 2019-12-14 DIAGNOSIS — Z7189 Other specified counseling: Secondary | ICD-10-CM | POA: Diagnosis not present

## 2019-12-14 DIAGNOSIS — R748 Abnormal levels of other serum enzymes: Secondary | ICD-10-CM | POA: Diagnosis not present

## 2019-12-14 DIAGNOSIS — I959 Hypotension, unspecified: Secondary | ICD-10-CM | POA: Diagnosis not present

## 2019-12-14 DIAGNOSIS — G459 Transient cerebral ischemic attack, unspecified: Secondary | ICD-10-CM | POA: Diagnosis not present

## 2019-12-14 DIAGNOSIS — I63512 Cerebral infarction due to unspecified occlusion or stenosis of left middle cerebral artery: Secondary | ICD-10-CM | POA: Diagnosis not present

## 2019-12-14 DIAGNOSIS — Z431 Encounter for attention to gastrostomy: Secondary | ICD-10-CM | POA: Diagnosis not present

## 2019-12-14 DIAGNOSIS — R456 Violent behavior: Secondary | ICD-10-CM | POA: Diagnosis not present

## 2019-12-14 DIAGNOSIS — Z4659 Encounter for fitting and adjustment of other gastrointestinal appliance and device: Secondary | ICD-10-CM | POA: Diagnosis not present

## 2019-12-14 DIAGNOSIS — I6389 Other cerebral infarction: Secondary | ICD-10-CM | POA: Diagnosis not present

## 2019-12-14 DIAGNOSIS — J019 Acute sinusitis, unspecified: Secondary | ICD-10-CM | POA: Diagnosis not present

## 2019-12-14 DIAGNOSIS — R0902 Hypoxemia: Secondary | ICD-10-CM | POA: Diagnosis not present

## 2019-12-14 DIAGNOSIS — Z87891 Personal history of nicotine dependence: Secondary | ICD-10-CM | POA: Diagnosis not present

## 2019-12-14 DIAGNOSIS — I69191 Dysphagia following nontraumatic intracerebral hemorrhage: Secondary | ICD-10-CM | POA: Diagnosis not present

## 2019-12-14 DIAGNOSIS — I129 Hypertensive chronic kidney disease with stage 1 through stage 4 chronic kidney disease, or unspecified chronic kidney disease: Secondary | ICD-10-CM | POA: Diagnosis not present

## 2019-12-14 DIAGNOSIS — I610 Nontraumatic intracerebral hemorrhage in hemisphere, subcortical: Secondary | ICD-10-CM | POA: Diagnosis not present

## 2019-12-14 DIAGNOSIS — R579 Shock, unspecified: Secondary | ICD-10-CM | POA: Diagnosis not present

## 2019-12-14 DIAGNOSIS — R61 Generalized hyperhidrosis: Secondary | ICD-10-CM | POA: Diagnosis not present

## 2019-12-14 DIAGNOSIS — J9600 Acute respiratory failure, unspecified whether with hypoxia or hypercapnia: Secondary | ICD-10-CM | POA: Diagnosis not present

## 2019-12-14 DIAGNOSIS — M7989 Other specified soft tissue disorders: Secondary | ICD-10-CM | POA: Diagnosis not present

## 2019-12-14 DIAGNOSIS — I361 Nonrheumatic tricuspid (valve) insufficiency: Secondary | ICD-10-CM | POA: Diagnosis not present

## 2019-12-14 DIAGNOSIS — Z930 Tracheostomy status: Secondary | ICD-10-CM | POA: Diagnosis not present

## 2019-12-14 DIAGNOSIS — G9349 Other encephalopathy: Secondary | ICD-10-CM | POA: Diagnosis not present

## 2019-12-14 DIAGNOSIS — Z9115 Patient's noncompliance with renal dialysis: Secondary | ICD-10-CM | POA: Diagnosis not present

## 2019-12-14 DIAGNOSIS — I629 Nontraumatic intracranial hemorrhage, unspecified: Secondary | ICD-10-CM | POA: Diagnosis not present

## 2019-12-14 DIAGNOSIS — J810 Acute pulmonary edema: Secondary | ICD-10-CM | POA: Diagnosis not present

## 2019-12-14 DIAGNOSIS — I2699 Other pulmonary embolism without acute cor pulmonale: Secondary | ICD-10-CM | POA: Diagnosis not present

## 2019-12-14 DIAGNOSIS — N2581 Secondary hyperparathyroidism of renal origin: Secondary | ICD-10-CM | POA: Diagnosis not present

## 2019-12-14 DIAGNOSIS — G936 Cerebral edema: Secondary | ICD-10-CM | POA: Diagnosis not present

## 2019-12-14 DIAGNOSIS — R9401 Abnormal electroencephalogram [EEG]: Secondary | ICD-10-CM | POA: Diagnosis not present

## 2019-12-14 DIAGNOSIS — I120 Hypertensive chronic kidney disease with stage 5 chronic kidney disease or end stage renal disease: Secondary | ICD-10-CM | POA: Diagnosis not present

## 2019-12-14 DIAGNOSIS — I160 Hypertensive urgency: Secondary | ICD-10-CM | POA: Diagnosis not present

## 2019-12-14 DIAGNOSIS — Z992 Dependence on renal dialysis: Secondary | ICD-10-CM | POA: Diagnosis not present

## 2019-12-14 DIAGNOSIS — I161 Hypertensive emergency: Secondary | ICD-10-CM | POA: Diagnosis not present

## 2019-12-14 DIAGNOSIS — R000 Tachycardia, unspecified: Secondary | ICD-10-CM | POA: Diagnosis not present

## 2019-12-14 DIAGNOSIS — R279 Unspecified lack of coordination: Secondary | ICD-10-CM | POA: Diagnosis not present

## 2019-12-14 DIAGNOSIS — J811 Chronic pulmonary edema: Secondary | ICD-10-CM | POA: Diagnosis not present

## 2019-12-14 DIAGNOSIS — I517 Cardiomegaly: Secondary | ICD-10-CM | POA: Diagnosis not present

## 2019-12-14 DIAGNOSIS — J9601 Acute respiratory failure with hypoxia: Secondary | ICD-10-CM | POA: Diagnosis not present

## 2019-12-14 DIAGNOSIS — R590 Localized enlarged lymph nodes: Secondary | ICD-10-CM | POA: Diagnosis not present

## 2019-12-14 DIAGNOSIS — I639 Cerebral infarction, unspecified: Secondary | ICD-10-CM | POA: Diagnosis not present

## 2019-12-14 DIAGNOSIS — Z743 Need for continuous supervision: Secondary | ICD-10-CM | POA: Diagnosis not present

## 2019-12-14 DIAGNOSIS — G9340 Encephalopathy, unspecified: Secondary | ICD-10-CM | POA: Diagnosis not present

## 2019-12-14 DIAGNOSIS — E8770 Fluid overload, unspecified: Secondary | ICD-10-CM | POA: Diagnosis not present

## 2019-12-14 DIAGNOSIS — E871 Hypo-osmolality and hyponatremia: Secondary | ICD-10-CM | POA: Diagnosis not present

## 2019-12-14 DIAGNOSIS — F17210 Nicotine dependence, cigarettes, uncomplicated: Secondary | ICD-10-CM | POA: Diagnosis not present

## 2019-12-14 DIAGNOSIS — Z9911 Dependence on respirator [ventilator] status: Secondary | ICD-10-CM | POA: Diagnosis not present

## 2019-12-14 DIAGNOSIS — I1311 Hypertensive heart and chronic kidney disease without heart failure, with stage 5 chronic kidney disease, or end stage renal disease: Secondary | ICD-10-CM | POA: Diagnosis not present

## 2019-12-14 DIAGNOSIS — F17200 Nicotine dependence, unspecified, uncomplicated: Secondary | ICD-10-CM | POA: Diagnosis not present

## 2019-12-14 DIAGNOSIS — R1310 Dysphagia, unspecified: Secondary | ICD-10-CM | POA: Diagnosis not present

## 2019-12-14 DIAGNOSIS — J90 Pleural effusion, not elsewhere classified: Secondary | ICD-10-CM | POA: Diagnosis not present

## 2019-12-14 DIAGNOSIS — R569 Unspecified convulsions: Secondary | ICD-10-CM | POA: Diagnosis not present

## 2019-12-14 DIAGNOSIS — R188 Other ascites: Secondary | ICD-10-CM | POA: Diagnosis not present

## 2019-12-14 DIAGNOSIS — R4020 Unspecified coma: Secondary | ICD-10-CM | POA: Diagnosis not present

## 2019-12-14 DIAGNOSIS — I5030 Unspecified diastolic (congestive) heart failure: Secondary | ICD-10-CM | POA: Diagnosis not present

## 2019-12-14 DIAGNOSIS — S062X9A Diffuse traumatic brain injury with loss of consciousness of unspecified duration, initial encounter: Secondary | ICD-10-CM | POA: Diagnosis not present

## 2019-12-14 DIAGNOSIS — R4182 Altered mental status, unspecified: Secondary | ICD-10-CM | POA: Diagnosis not present

## 2019-12-14 DIAGNOSIS — I10 Essential (primary) hypertension: Secondary | ICD-10-CM | POA: Diagnosis not present

## 2019-12-14 DIAGNOSIS — G904 Autonomic dysreflexia: Secondary | ICD-10-CM | POA: Diagnosis not present

## 2019-12-14 DIAGNOSIS — I619 Nontraumatic intracerebral hemorrhage, unspecified: Secondary | ICD-10-CM | POA: Diagnosis not present

## 2019-12-14 DIAGNOSIS — D631 Anemia in chronic kidney disease: Secondary | ICD-10-CM | POA: Diagnosis not present

## 2019-12-14 DIAGNOSIS — Z720 Tobacco use: Secondary | ICD-10-CM | POA: Diagnosis not present

## 2019-12-14 DIAGNOSIS — R918 Other nonspecific abnormal finding of lung field: Secondary | ICD-10-CM | POA: Diagnosis not present

## 2019-12-14 DIAGNOSIS — R001 Bradycardia, unspecified: Secondary | ICD-10-CM | POA: Diagnosis not present

## 2019-12-14 DIAGNOSIS — J9602 Acute respiratory failure with hypercapnia: Secondary | ICD-10-CM | POA: Diagnosis not present

## 2019-12-14 DIAGNOSIS — Z20822 Contact with and (suspected) exposure to covid-19: Secondary | ICD-10-CM | POA: Diagnosis not present

## 2019-12-14 DIAGNOSIS — N186 End stage renal disease: Secondary | ICD-10-CM | POA: Diagnosis not present

## 2019-12-14 DIAGNOSIS — I132 Hypertensive heart and chronic kidney disease with heart failure and with stage 5 chronic kidney disease, or end stage renal disease: Secondary | ICD-10-CM | POA: Diagnosis not present

## 2019-12-14 DIAGNOSIS — R531 Weakness: Secondary | ICD-10-CM | POA: Diagnosis not present

## 2019-12-14 DIAGNOSIS — Z515 Encounter for palliative care: Secondary | ICD-10-CM | POA: Diagnosis not present

## 2019-12-14 DIAGNOSIS — J151 Pneumonia due to Pseudomonas: Secondary | ICD-10-CM | POA: Diagnosis not present

## 2019-12-14 DIAGNOSIS — I69354 Hemiplegia and hemiparesis following cerebral infarction affecting left non-dominant side: Secondary | ICD-10-CM | POA: Diagnosis not present

## 2019-12-14 DIAGNOSIS — R451 Restlessness and agitation: Secondary | ICD-10-CM | POA: Diagnosis not present

## 2019-12-14 DIAGNOSIS — R509 Fever, unspecified: Secondary | ICD-10-CM | POA: Diagnosis not present

## 2019-12-14 DIAGNOSIS — R404 Transient alteration of awareness: Secondary | ICD-10-CM | POA: Diagnosis not present

## 2019-12-14 DIAGNOSIS — J0181 Other acute recurrent sinusitis: Secondary | ICD-10-CM | POA: Diagnosis not present

## 2019-12-14 DIAGNOSIS — Z931 Gastrostomy status: Secondary | ICD-10-CM | POA: Diagnosis not present

## 2019-12-19 DIAGNOSIS — I129 Hypertensive chronic kidney disease with stage 1 through stage 4 chronic kidney disease, or unspecified chronic kidney disease: Secondary | ICD-10-CM | POA: Diagnosis not present

## 2019-12-19 DIAGNOSIS — Z992 Dependence on renal dialysis: Secondary | ICD-10-CM | POA: Diagnosis not present

## 2019-12-19 DIAGNOSIS — N186 End stage renal disease: Secondary | ICD-10-CM | POA: Diagnosis not present

## 2019-12-24 MED ORDER — GENERIC EXTERNAL MEDICATION
12.50 | Status: DC
Start: 2019-12-24 — End: 2019-12-24

## 2019-12-24 MED ORDER — FAMOTIDINE 10 MG PO TABS
10.00 | ORAL_TABLET | ORAL | Status: DC
Start: 2020-01-04 — End: 2019-12-24

## 2019-12-24 MED ORDER — CARVEDILOL 12.5 MG PO TABS
12.50 | ORAL_TABLET | ORAL | Status: DC
Start: 2019-12-24 — End: 2019-12-24

## 2019-12-24 MED ORDER — GENERIC EXTERNAL MEDICATION
0.00 | Status: DC
Start: ? — End: 2019-12-24

## 2019-12-24 MED ORDER — HYDRALAZINE HCL 50 MG PO TABS
100.00 | ORAL_TABLET | ORAL | Status: DC
Start: 2020-01-11 — End: 2019-12-24

## 2019-12-24 MED ORDER — PROPOFOL 100 MG/10ML IV EMUL
0.00 | INTRAVENOUS | Status: DC
Start: ? — End: 2019-12-24

## 2019-12-24 MED ORDER — AMLODIPINE BESYLATE 5 MG PO TABS
10.00 | ORAL_TABLET | ORAL | Status: DC
Start: 2020-01-21 — End: 2019-12-24

## 2019-12-24 MED ORDER — PRAZOSIN HCL 1 MG PO CAPS
2.00 | ORAL_CAPSULE | ORAL | Status: DC
Start: 2019-12-24 — End: 2019-12-24

## 2019-12-24 MED ORDER — LABETALOL HCL 5 MG/ML IV SOLN
10.00 | INTRAVENOUS | Status: DC
Start: ? — End: 2019-12-24

## 2019-12-24 MED ORDER — ACETAMINOPHEN 325 MG PO TABS
650.00 | ORAL_TABLET | ORAL | Status: DC
Start: ? — End: 2019-12-24

## 2019-12-24 MED ORDER — CHLORHEXIDINE GLUCONATE 0.12 % MT SOLN
15.00 | OROMUCOSAL | Status: DC
Start: 2020-01-03 — End: 2019-12-24

## 2019-12-24 MED ORDER — OXIDIZED CELLULOSE EX PADS
1.00 | MEDICATED_PAD | CUTANEOUS | Status: DC
Start: ? — End: 2019-12-24

## 2019-12-24 MED ORDER — FENTANYL CITRATE (PF) 50 MCG/ML IJ SOLN
50.00 | INTRAMUSCULAR | Status: DC
Start: ? — End: 2019-12-24

## 2019-12-24 MED ORDER — ALBUTEROL SULFATE (2.5 MG/3ML) 0.083% IN NEBU
2.50 | INHALATION_SOLUTION | RESPIRATORY_TRACT | Status: DC
Start: ? — End: 2019-12-24

## 2019-12-24 MED ORDER — SEVELAMER CARBONATE 800 MG PO TABS
800.00 | ORAL_TABLET | ORAL | Status: DC
Start: 2019-12-24 — End: 2019-12-24

## 2019-12-24 MED ORDER — LEVETIRACETAM 500 MG PO TABS
500.00 | ORAL_TABLET | ORAL | Status: DC
Start: 2019-12-24 — End: 2019-12-24

## 2019-12-24 MED ORDER — IPRATROPIUM-ALBUTEROL 0.5-2.5 (3) MG/3ML IN SOLN
3.00 | RESPIRATORY_TRACT | Status: DC
Start: 2019-12-24 — End: 2019-12-24

## 2019-12-24 MED ORDER — GENERIC EXTERNAL MEDICATION
Status: DC
Start: ? — End: 2019-12-24

## 2019-12-24 MED ORDER — LOSARTAN POTASSIUM 50 MG PO TABS
100.00 | ORAL_TABLET | ORAL | Status: DC
Start: 2020-01-12 — End: 2019-12-24

## 2019-12-24 MED ORDER — CLEVIDIPINE 50 MG/100ML IV EMUL
0.00 | INTRAVENOUS | Status: DC
Start: ? — End: 2019-12-24

## 2019-12-24 MED ORDER — CHLORHEXIDINE GLUCONATE 0.12 % MT SOLN
15.00 | OROMUCOSAL | Status: DC
Start: ? — End: 2019-12-24

## 2019-12-24 MED ORDER — PRO-STAT PO LIQD
ORAL | Status: DC
Start: 2020-01-03 — End: 2019-12-24

## 2019-12-24 MED ORDER — GENERIC EXTERNAL MEDICATION
15.00 L | Status: DC
Start: ? — End: 2019-12-24

## 2020-01-03 MED ORDER — IPRATROPIUM-ALBUTEROL 0.5-2.5 (3) MG/3ML IN SOLN
3.00 | RESPIRATORY_TRACT | Status: DC
Start: 2020-01-20 — End: 2020-01-03

## 2020-01-03 MED ORDER — GENERIC EXTERNAL MEDICATION
15.00 L | Status: DC
Start: ? — End: 2020-01-03

## 2020-01-03 MED ORDER — LABETALOL HCL 5 MG/ML IV SOLN
10.00 | INTRAVENOUS | Status: DC
Start: ? — End: 2020-01-03

## 2020-01-03 MED ORDER — AMOXICILLIN-POT CLAVULANATE 400-57 MG/5ML PO SUSR
500.00 | ORAL | Status: DC
Start: 2020-01-03 — End: 2020-01-03

## 2020-01-03 MED ORDER — PRAZOSIN HCL 5 MG PO CAPS
10.00 | ORAL_CAPSULE | ORAL | Status: DC
Start: 2020-01-20 — End: 2020-01-03

## 2020-01-03 MED ORDER — AMANTADINE HCL 100 MG PO CAPS
100.00 | ORAL_CAPSULE | ORAL | Status: DC
Start: 2020-01-05 — End: 2020-01-03

## 2020-01-03 MED ORDER — CARVEDILOL 12.5 MG PO TABS
25.00 | ORAL_TABLET | ORAL | Status: DC
Start: 2020-01-20 — End: 2020-01-03

## 2020-01-03 MED ORDER — HYDRALAZINE HCL 20 MG/ML IJ SOLN
10.00 | INTRAMUSCULAR | Status: DC
Start: ? — End: 2020-01-03

## 2020-01-03 MED ORDER — OXYCODONE HCL 5 MG PO TABS
5.00 | ORAL_TABLET | ORAL | Status: DC
Start: 2020-01-03 — End: 2020-01-03

## 2020-01-03 MED ORDER — CLONAZEPAM 0.5 MG PO TABS
0.50 | ORAL_TABLET | ORAL | Status: DC
Start: 2020-01-03 — End: 2020-01-03

## 2020-01-03 MED ORDER — SODIUM CHLORIDE 1 G PO TABS
2000.00 | ORAL_TABLET | ORAL | Status: DC
Start: 2020-01-03 — End: 2020-01-03

## 2020-01-03 MED ORDER — HEPARIN SODIUM (PORCINE) 5000 UNIT/ML IJ SOLN
5000.00 | INTRAMUSCULAR | Status: DC
Start: 2020-01-11 — End: 2020-01-03

## 2020-01-03 MED ORDER — GENERIC EXTERNAL MEDICATION
Status: DC
Start: ? — End: 2020-01-03

## 2020-01-03 MED ORDER — CLONIDINE HCL 0.1 MG PO TABS
0.30 | ORAL_TABLET | ORAL | Status: DC
Start: 2020-01-11 — End: 2020-01-03

## 2020-01-11 MED ORDER — CLONAZEPAM 0.5 MG PO TABS
0.50 | ORAL_TABLET | ORAL | Status: DC
Start: 2020-01-12 — End: 2020-01-11

## 2020-01-11 MED ORDER — CLONIDINE 0.3 MG/24HR TD PTWK
1.00 | MEDICATED_PATCH | TRANSDERMAL | Status: DC
Start: 2020-01-25 — End: 2020-01-11

## 2020-01-11 MED ORDER — SEVELAMER CARBONATE 0.8 G PO PACK
1600.00 | PACK | ORAL | Status: DC
Start: 2020-01-11 — End: 2020-01-11

## 2020-01-11 MED ORDER — GUAIFENESIN 100 MG/5ML PO SYRP
200.00 | ORAL_SOLUTION | ORAL | Status: DC
Start: ? — End: 2020-01-11

## 2020-01-11 MED ORDER — SODIUM CHLORIDE 1 G PO TABS
1000.00 | ORAL_TABLET | ORAL | Status: DC
Start: 2020-01-11 — End: 2020-01-11

## 2020-01-11 MED ORDER — GENERIC EXTERNAL MEDICATION
Status: DC
Start: ? — End: 2020-01-11

## 2020-01-11 MED ORDER — SODIUM CHLORIDE 3 % IN NEBU
3.00 | INHALATION_SOLUTION | RESPIRATORY_TRACT | Status: DC
Start: 2020-01-20 — End: 2020-01-11

## 2020-01-11 MED ORDER — OXYCODONE HCL 5 MG PO TABS
5.00 | ORAL_TABLET | ORAL | Status: DC
Start: 2020-01-11 — End: 2020-01-11

## 2020-01-11 MED ORDER — AMANTADINE HCL 100 MG PO CAPS
200.00 | ORAL_CAPSULE | ORAL | Status: DC
Start: 2020-01-24 — End: 2020-01-11

## 2020-01-20 MED ORDER — BISACODYL 10 MG RE SUPP
10.00 | RECTAL | Status: DC
Start: ? — End: 2020-01-20

## 2020-01-20 MED ORDER — OXYCODONE HCL 5 MG PO TABS
5.00 | ORAL_TABLET | ORAL | Status: DC
Start: 2020-01-20 — End: 2020-01-20

## 2020-01-20 MED ORDER — GENERIC EXTERNAL MEDICATION
5.00 | Status: DC
Start: 2020-01-20 — End: 2020-01-20

## 2020-01-20 MED ORDER — CLONAZEPAM 1 MG PO TABS
1.00 | ORAL_TABLET | ORAL | Status: DC
Start: 2020-01-20 — End: 2020-01-20

## 2020-01-20 MED ORDER — GENERIC EXTERNAL MEDICATION
Status: DC
Start: ? — End: 2020-01-20

## 2020-01-20 MED ORDER — HYDRALAZINE HCL 50 MG PO TABS
100.00 | ORAL_TABLET | ORAL | Status: DC
Start: 2020-01-20 — End: 2020-01-20

## 2020-01-20 MED ORDER — LOSARTAN POTASSIUM 50 MG PO TABS
100.00 | ORAL_TABLET | ORAL | Status: DC
Start: 2020-01-21 — End: 2020-01-20

## 2020-01-20 MED ORDER — CARBOXYMETHYLCELLULOSE SODIUM 0.5 % OP SOLN
1.00 | OPHTHALMIC | Status: DC
Start: ? — End: 2020-01-20

## 2020-01-20 MED ORDER — POLYETHYLENE GLYCOL 3350 17 GM/SCOOP PO POWD
17.00 | ORAL | Status: DC
Start: ? — End: 2020-01-20

## 2020-01-20 MED ORDER — ASPIRIN 81 MG PO TBEC
81.00 | DELAYED_RELEASE_TABLET | ORAL | Status: DC
Start: 2020-01-21 — End: 2020-01-20

## 2020-02-17 DEATH — deceased

## 2022-07-19 DEATH — deceased
